# Patient Record
Sex: Male | Born: 1935 | Race: Black or African American | Hispanic: No | Smoking: Never smoker
Health system: Southern US, Community
[De-identification: ages and names within clinical notes are randomized; demographics above are authoritative.]

## PROBLEM LIST (undated history)

## (undated) DIAGNOSIS — R1310 Dysphagia, unspecified: Secondary | ICD-10-CM

## (undated) DIAGNOSIS — G9340 Encephalopathy, unspecified: Secondary | ICD-10-CM

## (undated) DIAGNOSIS — E119 Type 2 diabetes mellitus without complications: Secondary | ICD-10-CM

## (undated) DIAGNOSIS — I10 Essential (primary) hypertension: Secondary | ICD-10-CM

## (undated) DIAGNOSIS — Z789 Other specified health status: Secondary | ICD-10-CM

## (undated) DIAGNOSIS — N289 Disorder of kidney and ureter, unspecified: Secondary | ICD-10-CM

## (undated) DIAGNOSIS — K2970 Gastritis, unspecified, without bleeding: Secondary | ICD-10-CM

## (undated) DIAGNOSIS — K922 Gastrointestinal hemorrhage, unspecified: Secondary | ICD-10-CM

## (undated) DIAGNOSIS — R001 Bradycardia, unspecified: Secondary | ICD-10-CM

## (undated) DIAGNOSIS — F0390 Unspecified dementia without behavioral disturbance: Secondary | ICD-10-CM

---

## 2002-05-16 DIAGNOSIS — K2970 Gastritis, unspecified, without bleeding: Secondary | ICD-10-CM

## 2002-05-16 DIAGNOSIS — K922 Gastrointestinal hemorrhage, unspecified: Secondary | ICD-10-CM

## 2002-05-16 HISTORY — DX: Gastrointestinal hemorrhage, unspecified: K92.2

## 2002-05-16 HISTORY — DX: Gastritis, unspecified, without bleeding: K29.70

## 2002-05-16 HISTORY — PX: UPPER GASTROINTESTINAL ENDOSCOPY: SHX188

## 2002-07-02 ENCOUNTER — Ambulatory Visit (HOSPITAL_COMMUNITY): Admission: RE | Admit: 2002-07-02 | Discharge: 2002-07-02 | Payer: Self-pay | Admitting: Family Medicine

## 2002-07-02 ENCOUNTER — Encounter: Payer: Self-pay | Admitting: Family Medicine

## 2002-07-26 ENCOUNTER — Ambulatory Visit (HOSPITAL_COMMUNITY): Admission: RE | Admit: 2002-07-26 | Discharge: 2002-07-26 | Payer: Self-pay | Admitting: Family Medicine

## 2002-07-26 ENCOUNTER — Encounter: Payer: Self-pay | Admitting: Family Medicine

## 2002-11-18 ENCOUNTER — Encounter: Payer: Self-pay | Admitting: *Deleted

## 2002-11-18 ENCOUNTER — Emergency Department (HOSPITAL_COMMUNITY): Admission: EM | Admit: 2002-11-18 | Discharge: 2002-11-18 | Payer: Self-pay | Admitting: *Deleted

## 2002-11-19 ENCOUNTER — Inpatient Hospital Stay (HOSPITAL_COMMUNITY): Admission: EM | Admit: 2002-11-19 | Discharge: 2002-11-22 | Payer: Self-pay | Admitting: *Deleted

## 2003-10-22 ENCOUNTER — Emergency Department (HOSPITAL_COMMUNITY): Admission: EM | Admit: 2003-10-22 | Discharge: 2003-10-22 | Payer: Self-pay | Admitting: Emergency Medicine

## 2004-04-23 ENCOUNTER — Ambulatory Visit (HOSPITAL_COMMUNITY): Admission: RE | Admit: 2004-04-23 | Discharge: 2004-04-23 | Payer: Self-pay | Admitting: Family Medicine

## 2004-05-05 ENCOUNTER — Emergency Department (HOSPITAL_COMMUNITY): Admission: EM | Admit: 2004-05-05 | Discharge: 2004-05-06 | Payer: Self-pay | Admitting: *Deleted

## 2004-07-16 ENCOUNTER — Ambulatory Visit (HOSPITAL_COMMUNITY): Admission: RE | Admit: 2004-07-16 | Discharge: 2004-07-16 | Payer: Self-pay | Admitting: Family Medicine

## 2004-09-14 ENCOUNTER — Ambulatory Visit (HOSPITAL_COMMUNITY): Admission: RE | Admit: 2004-09-14 | Discharge: 2004-09-14 | Payer: Self-pay | Admitting: Family Medicine

## 2006-01-04 ENCOUNTER — Ambulatory Visit (HOSPITAL_COMMUNITY): Admission: RE | Admit: 2006-01-04 | Discharge: 2006-01-04 | Payer: Self-pay | Admitting: Internal Medicine

## 2006-02-15 ENCOUNTER — Encounter (INDEPENDENT_AMBULATORY_CARE_PROVIDER_SITE_OTHER): Payer: Self-pay | Admitting: Specialist

## 2006-02-15 ENCOUNTER — Observation Stay (HOSPITAL_COMMUNITY): Admission: RE | Admit: 2006-02-15 | Discharge: 2006-02-16 | Payer: Self-pay | Admitting: General Surgery

## 2006-05-11 ENCOUNTER — Ambulatory Visit (HOSPITAL_COMMUNITY): Admission: RE | Admit: 2006-05-11 | Discharge: 2006-05-11 | Payer: Self-pay | Admitting: Family Medicine

## 2006-08-21 ENCOUNTER — Ambulatory Visit (HOSPITAL_COMMUNITY): Admission: RE | Admit: 2006-08-21 | Discharge: 2006-08-21 | Payer: Self-pay | Admitting: Family Medicine

## 2007-10-27 ENCOUNTER — Emergency Department (HOSPITAL_COMMUNITY): Admission: EM | Admit: 2007-10-27 | Discharge: 2007-10-27 | Payer: Self-pay | Admitting: Emergency Medicine

## 2008-02-03 ENCOUNTER — Emergency Department (HOSPITAL_COMMUNITY): Admission: EM | Admit: 2008-02-03 | Discharge: 2008-02-03 | Payer: Self-pay | Admitting: Emergency Medicine

## 2008-10-27 ENCOUNTER — Ambulatory Visit (HOSPITAL_COMMUNITY): Admission: RE | Admit: 2008-10-27 | Discharge: 2008-10-27 | Payer: Self-pay | Admitting: Family Medicine

## 2009-03-02 ENCOUNTER — Ambulatory Visit (HOSPITAL_COMMUNITY): Admission: RE | Admit: 2009-03-02 | Discharge: 2009-03-02 | Payer: Self-pay | Admitting: Family Medicine

## 2009-11-30 ENCOUNTER — Ambulatory Visit (HOSPITAL_COMMUNITY): Admission: RE | Admit: 2009-11-30 | Discharge: 2009-11-30 | Payer: Self-pay | Admitting: Family Medicine

## 2010-09-14 HISTORY — PX: INGUINAL HERNIA REPAIR: SUR1180

## 2010-10-01 NOTE — H&P (Signed)
NAME:  Paul Perez, BELTRE                       ACCOUNT NO.:  [id number]   MEDICAL RECORD NO.:  [id number]                   PATIENT TYPE:  INP   LOCATION:  A228                                 FACILITY:  [facility]   PHYSICIAN:  Angus G. Everette Rank, M.D.              DATE OF BIRTH:  06/30/1928   DATE OF ADMISSION:  11/19/2002  DATE OF DISCHARGE:                                HISTORY & PHYSICAL   A 77 year old African American male was in his usual state of health until  two days ago, Sunday, when he ate some chicken that he thought may have been  spoiled.  He subsequently developed some abdominal pain the following  morning and had several diarrheal stools after taking Milk of Magnesia.  The  patient apparently was seen in the ED and treated for gastroenteritis.  Developed some weakness and dizziness.  Today had noted black stools.  Returned to the ED where he was evaluated.  It was noted that his hemoglobin  had dropped from 11.4 to 7.1 with a hematocrit of 34 and dropped to 20.8.  Other chemistries:  Sodium 137, potassium 5.3, chloride 111, CO2 23, glucose  329, BUN 46, creatinine 1.2.  IV fluids were started.  The patient was  admitted with GI bleeding.  Apparently had heme-positive stool in the ED as  well.   FAMILY HISTORY:  Negative.   SOCIAL HISTORY:  The patient does not smoke or drink alcohol.   PAST MEDICAL AND SURGICAL HISTORY:  The patient has had no previous surgery.  He does have known non-insulin dependent diabetes.  He has history of  arthritic knees bilaterally for which he takes occasional aspirin.   ALLERGIES:  No known allergies.   MEDICATIONS:  1. Glucophage 2 daily.  2. Aspirin 1 daily.   REVIEW OF SYSTEMS:  HEENT:  Negative.  CARDIOPULMONARY:  No cough,  hemoptysis.  Some slight dyspnea.  GI:  No bowel irregularity but recent  onset of loose stools, tarry stools, abdominal discomfort.   PHYSICAL EXAMINATION:  VITAL SIGNS:  Blood pressure 138/64, respirations  20,  pulse 90, temperature 99.8.  HEENT:  Eyes:  PERRLA.  TM's negative.  Oropharynx benign.  NECK:  Supple.  No JVD or thyroid abnormalities.  HEART:  Regular rhythm.  No murmurs.  LUNGS:  Clear to P&A.  ABDOMEN:  Tenderness in the upper abdomen.  No palpable organs or masses.  RECTAL:  Examination negative.  Heme-positive stool.  EXTREMITIES:  Free of edema.  NEUROLOGIC:  No focal deficit.  The patient has some tenderness and  decreased range of motion of both knees.    DIAGNOSES:  1. Gastrointestinal bleeding.  2. Non-insulin dependent diabetes.  Angus G. Everette Rank, M.D.    AGM/MEDQ  D:  11/19/2002  T:  11/19/2002  Job:  [id number]

## 2010-10-01 NOTE — Group Therapy Note (Signed)
   NAME:  Paul Perez, Paul Perez                       ACCOUNT NO.:  [id number]   MEDICAL RECORD NO.:  [id number]                   PATIENT TYPE:  INP   LOCATION:  A228                                 FACILITY:  [facility]   PHYSICIAN:  Angus G. Everette Rank, M.D.              DATE OF BIRTH:  06/30/1928   DATE OF PROCEDURE:  11/20/2002  DATE OF DISCHARGE:                                   PROGRESS NOTE   SUBJECTIVE:  This patient was admitted with gastrointestinal bleeding.  He  remains stable.  He did have EGD yesterday which showed evidence of duodenal  ulcer with arterial bleed.   OBJECTIVE:  Vital signs:  Blood pressure 151/76, respirations 20, pulse 80,  temperature 97.2.  Lungs:  Clear to P&A.  Heart:  Regular rhythm.  Abdomen:  No palpable organs or masses.  The patient's last hemoglobin was 8.1,  hematocrit 23.6.   ASSESSMENT:  The patient was admitted with gastrointestinal bleed secondary  to duodenal ulcer.   PLAN:  Repeat hemoglobin and hematocrit this morning.  Will continue  transfusion as needed.                                               Angus G. Everette Rank, M.D.    AGM/MEDQ  D:  11/20/2002  T:  11/20/2002  Job:  [id number]

## 2010-10-01 NOTE — Discharge Summary (Signed)
NAME:  Paul Perez, Paul Perez                       ACCOUNT NO.:  [id number]   MEDICAL RECORD NO.:  [id number]                   PATIENT TYPE:  INP   LOCATION:  A228                                 FACILITY:  [facility]   PHYSICIAN:  Angus G. Everette Rank, M.D.              DATE OF BIRTH:  06/30/1928   DATE OF ADMISSION:  11/19/2002  DATE OF DISCHARGE:  11/22/2002                                 DISCHARGE SUMMARY   This is a 77 year old African-American male admitted November 19, 2002,  discharged November 22, 2002, three days hospitalization.   DIAGNOSES:  1. Gastrointestinal bleeding secondary to duodenal ulcer with hemorrhage.  2. Helicobacter pylori infection.  3. Gastritis.  4. Diabetes mellitus type 2.   CONDITION:  Stable and improved at time of discharge.   A 77 year old African-American male in usual state of health two days prior  to admission when he ate some chicken that he thought had been spoiled.  He  subsequently developed abdominal pain the following morning and several  diarrheal stools after taking milk of magnesia.  He was seen in the ED,  treated for gastroenteritis, developed some weakness and dizziness, noted  black stools, returned to ED where he was evaluated.  His hemoglobin had  dropped from 11.4 to 7.1 with a hematocrit of 34 and dropped to 20 and 0.8.  The patient was subsequently admitted for gastrointestinal bleeding.   EXAMINATION:  GENERAL:  Alert male.  VITAL SIGNS:  Blood pressure of 138/64, respirations 20, pulse 90,  temperature 99.8.  HEENT:  Eyes - PERRLA.  Tympanic membranes negative.  Oropharynx benign.  NECK:  Supple.  No JVD or thyroid abnormalities.  HEART:  Regular rhythm with no murmurs.  LUNGS:  Clear to auscultation and percussion.  ABDOMEN:  Tenderness in the upper abdomen.  No organomegaly or internal  masses.  RECTAL:  Negative.  Heme positive stool.  EXTREMITIES:  Free of edema.  NEUROLOGIC:  No focal deficits.   LABORATORY DATA:  Admission CBC  - WBC 9700 with hemoglobin of 7.1,  hematocrit 20.8, 88 neutrophils, 8.6 absolute neutrophils, 7 lymphocytes.  Chemistry - sodium 137, potassium 0.3, chloride 111, CO2 23, glucose 329,  BUN 46, creatinine 1.2, calcium 9.7.  Liver enzymes - SGOT 13.  SGPT 12.  Alkaline phosphatase 38.  Total bilirubin 0.5.  Direct bilirubin 0.1.   HOSPITAL COURSE:  The patient at time of admission was placed on  concentrated care.  Vital signs were monitored.  He was started on  intravenous fluids.  He was typed and cross-matched for two units of blood  and given blood.  On the day of admission, he was seen in consultation by  gastroenterology service.  EGD was scheduled.  The patient was placed on  sliding scale Humalog insulin.  EGD showed evidence of duodenal ulcer with  arterial spurting, gastritis, and duodenitis.  The patient tolerated  procedure well.  He ceased to  bleed after injection of dilute epinephrine  into sight of bleeding.  The patient was made n.p.o.  Following procedures  he was given I.V. Protonix 40 mg q.12h, Carafate liquid a gram a.c. and h.s.  His diet was advanced.  The patient was subsequently placed on Niferex 150  mg b.i.d., transfused a second unit of blood.  The patient slowly progressed  with improvement during his stay and was discharged after the third day  hospitalization to be followed as an outpatient.  He was discharged on  Glucophage XR 500 mg b.i.d., Niferex 150 mg b.i.d., and Protonix 40 mg  daily.                                               Angus G. Everette Rank, M.D.    AGM/MEDQ  D:  12/10/2002  T:  12/10/2002  Job:  [id number]

## 2010-10-01 NOTE — Op Note (Signed)
NAME:  Paul Perez, Paul Perez                       ACCOUNT NO.:  [id number]   MEDICAL RECORD NO.:  [id number]                   PATIENT TYPE:  INP   LOCATION:  A228                                 FACILITY:  [facility]   PHYSICIAN:  Hildred Laser, M.D.                 DATE OF BIRTH:  06/30/1928   DATE OF PROCEDURE:  DATE OF DISCHARGE:                                  PROCEDURE NOTE   ENDOSCOPIST:  Hildred Laser, M.D.   INDICATIONS:  [name] is a 77 year old African-American male with melena  and anemia.  He has a questionable history of peptic ulcer disease but he  has been on ASA and he took a dose this morning.  The procedure and risks  were reviewed with the patient and informed consent was obtained.   PREOPERATIVE MEDICATIONS:  Cetacaine spray for oropharyngeal topical  anesthesia, Demerol 25 mg IV and Versed 4 mg IV in divided dose.   FINDINGS:  Procedure performed in endoscopy suite.  The patient's vital  signs and O2 saturation were monitored during the procedure and remained  stable.  The patient was placed in the left lateral recumbent position and  endoscope was passed via the oropharynx without any difficulty into the  esophagus.   ESOPHAGUS:  Mucosa of the esophagus was normal throughout.  Squamocolumnar  junction was unremarkable.   STOMACH:  It had some maroon looking blood in the fundus.  Examination of  the mucosa revealed antral granularity.  There was fresh blood pouring out  of the pylorus into the stomach indicative of bleeding lesion in the  duodenum.  Angularis, fundus, and cardia were normal.   DUODENUM:  Examination of the bulb revealed erosions and an ulcer along the  anteromedial wall with arterial spurting.  There was pseudodiverticula  formation to the anterior wall secondary to previous PUD with scarring.   The bleeding site was injected with 1 cc of dilute epinephrine (1:10,000).  The bleeding site was coagulated with gold probe. There was complete  hemostasis.  Pictures were taken for the record.  Endoscope was withdrawn.  The patient tolerated the procedure well.   FINAL DIAGNOSES:  1. Duodenal/bulbar ulcer with arterial spurting.  Hemostasis secured with     injection therapy followed by coagulation with a gold probe.  2. Gastritis with duodenitis.  3. Bulbar pseudodiverticula.   RECOMMENDATIONS:  1. Double dose of PPI initially IV and will switch him to p.o. tomorrow.  2.     _________ liquid 1 gm a.c. and q.h.s.  3. Check H. pylori serology.  4. Will proceed with transfusion as planned.  Please note the first unit was     started while he was in day hospital.  Hildred Laser, M.D.    NR/MEDQ  D:  11/19/2002  T:  11/19/2002  Job:  [id number]   cc:   Angus G. Everette Rank, M.D.  [address]  [place]  [place] [address]  Fax: [phone]

## 2010-10-01 NOTE — Op Note (Signed)
NAMEGYASI, COVER NO.:  [id number]   MEDICAL RECORD NO.:  [id number]          PATIENT TYPE:  OBV   LOCATION:  A316                          FACILITY:  [facility]   PHYSICIAN:  Felicie Morn, M.D. DATE OF BIRTH:  06/30/1928   DATE OF PROCEDURE:  02/15/2006  DATE OF DISCHARGE:                                 OPERATIVE REPORT   SURGEON:  Felicie Morn, M.D.   PREOPERATIVE DIAGNOSIS:  Bilateral inguinal hernias (indirect inguinal  hernia on the left side, direct hernia on the right side).   POSTOPERATIVE DIAGNOSIS:  Bilateral inguinal hernias (indirect inguinal  hernia on the left side, direct hernia on the right side).   PROCEDURE:  Bilateral inguinal herniorrhaphies (modified McVay repair).   SPECIMEN:  Left inguinal hernia sac.  No specimen on the right side.   NOTE:  This is a 77 year old black male who presented with bilateral  inguinal hernias.  We discussed elective repair with him, discussing  complications not limited to but including bleeding, infection, and  recurrence.  Informed consent was obtained.   GROSS OPERATIVE FINDINGS:  Those consistent with an indirect defect on the  left side and a direct hernia defect on the right side.   TECHNIQUE:  The patient was placed in the supine position after the adequate  administration of spinal anesthesia and the placement of a Foley catheter  aseptically.   After prepping with Betadine and draping in the usual manner, an incision  was carried out between the anterior superior iliac spine and the pubic  tubercle on the left side through skin and subcutaneous tissue down through  Scarpa's layer and down to the external oblique which was opened through the  external ring.  The cord structures were then dissected free from an  inguinal hernia sac which was ligated with 2-0 Bralon under direct vision  and the redundant portion of the sac sent as a specimen.  The inguinal floor  was then repaired, suturing  the transversus abdominis and transversalis  fascia to Cooper's ligament and Poupart's ligament with interrupted 2-0  Bralon sutures.  The ilioinguinal nerve was preserved as well as the cord  structures, and there were returned to their anatomic position.  The  external oblique which was repaired over this.  We used 0.5% Sensorcaine in  the wound for postoperative comfort.  The wound was irrigated, and the skin  was approximated with a stapling device.  A sterile OpSite dressing was  applied.   Attention was then turned to the right side where an incision was again made  between the anterior superior iliac spine and the pubic tubercle.  The  external oblique was opened through the external ring, and the cord  structures were separated.  There was noted no indirect defect.  There was a  direct defect on the hernia floor.  This was then approximated and repaired  using interrupted sutures of 2-0 Bralon to close the transversalis fascia  and transversus abdominis to the Cooper's ligament and Poupart's ligament in  an interrupted fashion.  A relaxing incision was carried out prior to  cinching this  tightly.  We used 0.5% Sensorcaine to help with postoperative  comfort.  The cord structures were returned to their anatomic position, with  the external oblique repaired over this.  The wound was irrigated with  normal saline solution.  The skin was approximated with a stapling device.  Prior to closure, all sponge, needle, and instrument counts were found be  correct.  Estimated blood loss was minimal.   The patient received 1000 mL of crystalloids intraoperatively and was taken  to recovery room in satisfactory condition.      Felicie Morn, M.D.  Electronically Signed     WB/MEDQ  D:  02/15/2006  T:  02/16/2006  Job:  [id number]   cc:   Angus G. Everette Rank, MD  Fax: [phone]

## 2010-10-01 NOTE — Consult Note (Signed)
NAME:  Paul Perez, Paul Perez                       ACCOUNT NO.:  [id number]   MEDICAL RECORD NO.:  [id number]                   PATIENT TYPE:  INP   LOCATION:  [id number]                                 FACILITY:  [facility]   PHYSICIAN:  Hildred Laser, M.D.                 DATE OF BIRTH:  06/30/1928   DATE OF CONSULTATION:  11/19/2002  DATE OF DISCHARGE:                                   CONSULTATION   REASON FOR CONSULTATION:  Melena and anemia.   HISTORY OF PRESENT ILLNESS:  Paul Perez is a 77 year old African-American  male who was admitted to Dr. Everette Rank' service via emergency room a few  minutes ago.  He had a two-day history of melena and anemia.   The patient was in usual state of health until two days ago when he noted  rolling and rumbling in his abdomen, and he had five bowel movements.  The  stool was liquid and tarry, but he did not quite realize the significance.  Yesterday he had a few more tarry stools, and he felt very weak when he  would sit or stand.  He did not have any dyspnea, chest pain, or syncope.  He was brought to the emergency room by his son.  He was evaluated.  His  stool was heme positive.  He was treated and discharged.  The patient's  hemoglobin at that time was 11.4, hematocrit 34, and I noted him BUN was 35.  The patient was planning to see Dr. Everette Rank today, but this morning he felt  even weaker than he did yesterday.  However, he has not had any more melena  today.  He came to the emergency room again.  His hemoglobin this time was  7.1, and hematocrit was 20.8.  He was, therefore hospitalized for IV fluids,  packed RBCs and further workup.  The patient denies nausea, vomiting,  heartburn, dysphagia, or hematemesis.  He also denies epigastric pain.  His  bowels are irregular, but that has been pattern for years.  He recalls that  years ago it was felt that he had an ulcer; however, his x-rays were normal.  He is on ASA 325 mg daily, but he did not tell the ER  staff that he was  taking this medication.  He took a dose this morning.  He is on an oral  hypoglycemic b.i.d. and insulin via sliding scale.  He has a good appetite,  and he has not lost any weight recently.   PAST MEDICAL HISTORY:  Diabetes mellitus for about 10 months.  He has never  had any surgeries, but tells me that he may have some arthritis as he used  to play football.   ALLERGIES:  None known.   FAMILY HISTORY:  Positive for diabetes mellitus in mother who is 62 and  staying at home with help from her daughter.  His sister is also diabetic.  He  has a brother living who is also diabetic.  He lost a one brother of auto  accident in his 17s, and one sister died at an early age.   SOCIAL HISTORY:  He is widowed.  His wife died about 12 years ago.  He  worked at [employer] for 20 years.  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  He has five  children in good health.  He tells me he smoked cigarettes since he was 77  years old but quit about 13 to 14 years ago.  He smoked anywhere from one-  half to one and one-half packs per day.  He does not drink alcohol.   PHYSICAL EXAMINATION:  GENERAL:  Pleasant, well-developed, mildly obese  African-American male who is in no acute distress.  VITAL SIGNS:  Admission weight is pending.  Pulse 118 per minute, blood  pressure 115/58, respiratory rate 13, and temperature 98.  HEENT:  Conjunctivae pale.  Sclerae nonicteric.  Oropharyngeal mucosa  normal.  Dentition in satisfactory condition.  NECK:  Without masses or thyromegaly.  CARDIAC:  Regular rate and rhythm.  S1, S2.  No murmur or gallop noted.  LUNGS:  Clear to auscultation.  ABDOMEN:  Bowel sounds are normal.  Palpation reveals soft abdomen without  tenderness, organomegaly, or masses.  RECTAL:  He has had one this morning and one yesterday.  EXTREMITIES:  No edema or clubbing.   LABORATORY DATA:  On admission, WBC 9.7, hemoglobin 7.1, hematocrit 20.8,  platelet count 162,000,  MCV 80.6.   Serum sodium 137, potassium 5.3,  chloride 111, CO2 23, glucose 329, BUN 46, creatinine 1.2, calcium 7.9.  INR  1.1, PTT 23.   ASSESSMENT:  [name] is a healthy-appearing 77 year old African-American  male who presents with two-day history of melena.  He was in the emergency  room yesterday when he had a hemoglobin of 11.4 g, and his hemoglobin today  is 7.1 g.  Therefore, he has had a significant amount of blood loss.  He is  getting ready to be transfused.  He is on ASA for antiplatelet functions.  He has questionable history of peptic ulcer disease in the past.  I suspect  we are dealing with gastrointestinal bleed secondary to peptic ulcer  disease.   I agree with use of PPI in this setting.  Esophagogastroduodenoscopy with  therapeutic intention to be performed later this afternoon.   I would like to thank Dr. Everette Rank for the opportunity to participate in the  care of fine man.                                               Hildred Laser, M.D.    NR/MEDQ  D:  11/19/2002  T:  11/19/2002  Job:  [id number]

## 2010-10-01 NOTE — Group Therapy Note (Signed)
   NAME:  Paul Perez, Paul Perez                       ACCOUNT NO.:  [id number]   MEDICAL RECORD NO.:  [id number]                   PATIENT TYPE:  INP   LOCATION:  A228                                 FACILITY:  [facility]   PHYSICIAN:  Angus G. Everette Rank, M.D.              DATE OF BIRTH:  06/30/1928   DATE OF PROCEDURE:  DATE OF DISCHARGE:                                   PROGRESS NOTE   This patient was admitted with gastrointestinal bleeding.  His condition has  remained stable. He had no further evidence of blood, but he did drop his  hemoglobin down to 7.9 with hematocrit 22.8. He did receive a unit of blood  last evening; and most recent hemoglobin was 8.8 with hematocrit 25.6.   OBJECTIVE:  VITAL SIGNS: Blood pressure 138/61, respirations 20, pulse 61,  temperature 98.2.  LUNGS:  Clear to P&A.  HEART:  Regular rhythm.  ABDOMEN:  No palpable organs or masses.   ASSESSMENT:  The patient is admitted with gastrointestinal bleed, but does  have evidence of duodenal ulcer.  Has anemia secondary to GI bleed.   PLAN:  To continue current regimen. The patient may be discharged today  after being seen by gastroenterology service.                                               Angus G. Everette Rank, M.D.    AGM/MEDQ  D:  11/22/2002  T:  11/22/2002  Job:  [id number]

## 2012-06-22 ENCOUNTER — Ambulatory Visit (HOSPITAL_COMMUNITY)
Admission: RE | Admit: 2012-06-22 | Discharge: 2012-06-22 | Disposition: A | Discharge status: Home / Self Care | Payer: Medicare Other | Source: Ambulatory Visit | Attending: Family Medicine | Admitting: Family Medicine

## 2012-06-22 ENCOUNTER — Other Ambulatory Visit (HOSPITAL_COMMUNITY): Payer: Self-pay | Admitting: Family Medicine

## 2012-06-22 DIAGNOSIS — R059 Cough, unspecified: Secondary | ICD-10-CM | POA: Insufficient documentation

## 2012-06-22 DIAGNOSIS — R05 Cough: Secondary | ICD-10-CM

## 2012-09-10 ENCOUNTER — Ambulatory Visit (HOSPITAL_COMMUNITY)
Admission: RE | Admit: 2012-09-10 | Discharge: 2012-09-10 | Disposition: A | Discharge status: Home / Self Care | Payer: Medicare Other | Source: Ambulatory Visit | Attending: Family Medicine | Admitting: Family Medicine

## 2012-09-10 ENCOUNTER — Other Ambulatory Visit (HOSPITAL_COMMUNITY): Payer: Self-pay | Admitting: Family Medicine

## 2012-09-10 DIAGNOSIS — R05 Cough: Secondary | ICD-10-CM

## 2012-09-10 DIAGNOSIS — R059 Cough, unspecified: Secondary | ICD-10-CM | POA: Insufficient documentation

## 2013-08-20 ENCOUNTER — Inpatient Hospital Stay (HOSPITAL_COMMUNITY)
Admission: EM | Admit: 2013-08-20 | Discharge: 2013-08-29 | DRG: 557 | Disposition: A | Discharge status: Home Health | Payer: Medicare Other | Attending: Family Medicine | Admitting: Family Medicine

## 2013-08-20 ENCOUNTER — Emergency Department (HOSPITAL_COMMUNITY): Payer: Medicare Other

## 2013-08-20 ENCOUNTER — Encounter (HOSPITAL_COMMUNITY): Payer: Self-pay | Admitting: Emergency Medicine

## 2013-08-20 DIAGNOSIS — M6282 Rhabdomyolysis: Principal | ICD-10-CM | POA: Diagnosis present

## 2013-08-20 DIAGNOSIS — G929 Unspecified toxic encephalopathy: Secondary | ICD-10-CM | POA: Diagnosis present

## 2013-08-20 DIAGNOSIS — I491 Atrial premature depolarization: Secondary | ICD-10-CM | POA: Diagnosis not present

## 2013-08-20 DIAGNOSIS — G9340 Encephalopathy, unspecified: Secondary | ICD-10-CM

## 2013-08-20 DIAGNOSIS — R001 Bradycardia, unspecified: Secondary | ICD-10-CM

## 2013-08-20 DIAGNOSIS — I498 Other specified cardiac arrhythmias: Secondary | ICD-10-CM | POA: Diagnosis not present

## 2013-08-20 DIAGNOSIS — R5381 Other malaise: Secondary | ICD-10-CM | POA: Diagnosis present

## 2013-08-20 DIAGNOSIS — E860 Dehydration: Secondary | ICD-10-CM | POA: Diagnosis present

## 2013-08-20 DIAGNOSIS — E119 Type 2 diabetes mellitus without complications: Secondary | ICD-10-CM | POA: Diagnosis present

## 2013-08-20 DIAGNOSIS — F0390 Unspecified dementia without behavioral disturbance: Secondary | ICD-10-CM

## 2013-08-20 DIAGNOSIS — G92 Toxic encephalopathy: Secondary | ICD-10-CM | POA: Diagnosis present

## 2013-08-20 DIAGNOSIS — R4182 Altered mental status, unspecified: Secondary | ICD-10-CM

## 2013-08-20 DIAGNOSIS — N390 Urinary tract infection, site not specified: Secondary | ICD-10-CM | POA: Diagnosis present

## 2013-08-20 DIAGNOSIS — N179 Acute kidney failure, unspecified: Secondary | ICD-10-CM | POA: Diagnosis present

## 2013-08-20 DIAGNOSIS — I10 Essential (primary) hypertension: Secondary | ICD-10-CM | POA: Diagnosis present

## 2013-08-20 HISTORY — DX: Gastrointestinal hemorrhage, unspecified: K92.2

## 2013-08-20 HISTORY — DX: Essential (primary) hypertension: I10

## 2013-08-20 HISTORY — DX: Gastritis, unspecified, without bleeding: K29.70

## 2013-08-20 HISTORY — DX: Other specified health status: Z78.9

## 2013-08-20 LAB — COMPREHENSIVE METABOLIC PANEL WITH GFR
ALT: 23 U/L (ref 0–53)
AST: 94 U/L — ABNORMAL HIGH (ref 0–37)
Albumin: 4 g/dL (ref 3.5–5.2)
Alkaline Phosphatase: 86 U/L (ref 39–117)
BUN: 24 mg/dL — ABNORMAL HIGH (ref 6–23)
CO2: 26 meq/L (ref 19–32)
Calcium: 10 mg/dL (ref 8.4–10.5)
Chloride: 100 meq/L (ref 96–112)
Creatinine, Ser: 1.44 mg/dL — ABNORMAL HIGH (ref 0.50–1.35)
GFR calc Af Amer: 50 mL/min — ABNORMAL LOW
GFR calc non Af Amer: 43 mL/min — ABNORMAL LOW
Glucose, Bld: 99 mg/dL (ref 70–99)
Potassium: 4.4 meq/L (ref 3.7–5.3)
Sodium: 142 meq/L (ref 137–147)
Total Bilirubin: 0.8 mg/dL (ref 0.3–1.2)
Total Protein: 7.7 g/dL (ref 6.0–8.3)

## 2013-08-20 LAB — CBC WITH DIFFERENTIAL/PLATELET
Basophils Absolute: 0 10*3/uL (ref 0.0–0.1)
Basophils Relative: 0 % (ref 0–1)
Eosinophils Absolute: 0 10*3/uL (ref 0.0–0.7)
Eosinophils Relative: 0 % (ref 0–5)
HCT: 43.8 % (ref 39.0–52.0)
Hemoglobin: 14.4 g/dL (ref 13.0–17.0)
Lymphocytes Relative: 8 % — ABNORMAL LOW (ref 12–46)
Lymphs Abs: 0.7 10*3/uL (ref 0.7–4.0)
MCH: 27.8 pg (ref 26.0–34.0)
MCHC: 32.9 g/dL (ref 30.0–36.0)
MCV: 84.6 fL (ref 78.0–100.0)
Monocytes Absolute: 0.6 10*3/uL (ref 0.1–1.0)
Monocytes Relative: 7 % (ref 3–12)
Neutro Abs: 7.6 10*3/uL (ref 1.7–7.7)
Neutrophils Relative %: 84 % — ABNORMAL HIGH (ref 43–77)
Platelets: 201 10*3/uL (ref 150–400)
RBC: 5.18 MIL/uL (ref 4.22–5.81)
RDW: 13.4 % (ref 11.5–15.5)
WBC: 9 10*3/uL (ref 4.0–10.5)

## 2013-08-20 LAB — URINE MICROSCOPIC-ADD ON

## 2013-08-20 LAB — URINALYSIS, ROUTINE W REFLEX MICROSCOPIC
Bilirubin Urine: NEGATIVE
Glucose, UA: NEGATIVE mg/dL
LEUKOCYTES UA: NEGATIVE
NITRITE: NEGATIVE
UROBILINOGEN UA: 0.2 mg/dL (ref 0.0–1.0)
pH: 6 (ref 5.0–8.0)

## 2013-08-20 LAB — CBG MONITORING, ED: GLUCOSE-CAPILLARY: 82 mg/dL (ref 70–99)

## 2013-08-20 LAB — CK: Total CK: 6248 U/L — ABNORMAL HIGH (ref 7–232)

## 2013-08-20 MED ORDER — CIPROFLOXACIN HCL 250 MG PO TABS: 500.0000 mg | ORAL_TABLET | Freq: Once | ORAL | Status: DC

## 2013-08-20 MED ORDER — SODIUM CHLORIDE 0.9 % IV BOLUS (SEPSIS)
500.0000 mL | Freq: Once | INTRAVENOUS | Status: AC
  Administered 2013-08-20: 500 mL via INTRAVENOUS

## 2013-08-20 MED ORDER — SENNOSIDES-DOCUSATE SODIUM 8.6-50 MG PO TABS: 1.0000 | ORAL_TABLET | Freq: Every evening | ORAL | Status: DC | PRN

## 2013-08-20 MED ORDER — ENOXAPARIN SODIUM 30 MG/0.3ML ~~LOC~~ SOLN
30.0000 mg | SUBCUTANEOUS | Status: DC
  Administered 2013-08-22 – 2013-08-26 (×5): 30 mg via SUBCUTANEOUS
  Filled 2013-08-20 (×5): qty 0.3

## 2013-08-20 MED ORDER — CIPROFLOXACIN HCL 500 MG PO TABS: 500.0000 mg | ORAL_TABLET | Freq: Two times a day (BID) | ORAL | Status: DC

## 2013-08-20 MED ORDER — ASPIRIN 300 MG RE SUPP
300.0000 mg | Freq: Every day | RECTAL | Status: DC
  Filled 2013-08-20 (×10): qty 1

## 2013-08-20 MED ORDER — ASPIRIN 325 MG PO TABS
325.0000 mg | ORAL_TABLET | Freq: Every day | ORAL | Status: DC
  Administered 2013-08-21 – 2013-08-29 (×9): 325 mg via ORAL
  Filled 2013-08-20 (×9): qty 1

## 2013-08-20 MED ORDER — LORAZEPAM 2 MG/ML IJ SOLN
0.5000 mg | Freq: Once | INTRAMUSCULAR | Status: AC
  Administered 2013-08-20: 0.5 mg via INTRAVENOUS
  Filled 2013-08-20: qty 1

## 2013-08-20 NOTE — H&P (Signed)
[facility] History and Physical  Paul Perez [id number] DOB: [birth year] DOA: 08/20/2013  Referring physician:  PCP: Lanette Hampshire, MD  Specialists:   Chief Complaint: Confusion  HPI: Paul Perez is a 80 y.o. male with a history of HTN who was sent to the ED after his son had not been able to locate him from the night before, and was found outside in his truck.  He was confused and was not able to recognize people he knew.   He was sent to the ED for evaluation.  A CT scan of the brain was performed and was negative foacute findings. He was found to have a CPK level of 6248.   His son reports that at baseline he is coherent with rare moments of confusion.   Patient does not know why he is here, and denies having any chest pain of headache.       Review of Systems:  Constitutional: No Weight Loss, No Weight Gain, Night Sweats, Fevers, Chills, Fatigue, or Generalized Weakness HEENT: No Headaches, Difficulty Swallowing,Tooth/Dental Problems,Sore Throat,  No Sneezing, Rhinitis, Ear Ache, Nasal Congestion, or Post Nasal Drip,  Cardio-vascular:  No Chest pain, Orthopnea, PND, Edema in lower extremities, Anasarca, Dizziness, Palpitations  Resp: No Dyspnea, No DOE, No Cough, No Hemoptysis, No Wheezing.    GI: No Heartburn, Indigestion, Abdominal Pain, Nausea, Vomiting, Diarrhea, Change in Bowel Habits,  Loss of Appetite  GU: No Dysuria, Change in Color of Urine, No Urgency or Frequency.  No flank pain.  Musculoskeletal: No Joint Pain or Swelling.  No Decreased Range of Motion. No Back Pain.  Neurologic: No Syncope, No Seizures, Muscle Weakness, Paresthesia, Vision Disturbance or Loss, No Diplopia, No Vertigo, No Difficulty Walking,  Skin: No Rash or Lesions. Psych: No Change in Mood or Affect. No Depression or Anxiety. No Memory loss. +Confusion, No Hallucinations   Past Medical History  Diagnosis Date  . Hypertension   . Heart problem   . Poor historian        History reviewed. No pertinent past surgical history.     Prior to Admission medications   Medication Sig Start Date End Date Taking? Authorizing Provider  ciprofloxacin (CIPRO) 500 MG tablet Take 1 tablet (500 mg total) by mouth 2 (two) times daily. One po bid x 7 days 08/20/13   Maudry Diego, MD      No Known Allergies   Social History:  has no tobacco, alcohol, and drug history on file.     No family history on file.     Physical Exam:  GEN:  Agitated and Angry Elderly Well nourished and Well Developed  80 y.o. African American  male  examined  and in no acute distress; cooperative with exam Filed Vitals:   08/20/13 1809 08/20/13 1930 08/20/13 2017  BP: 178/81  178/92  Pulse: 74  76  Temp: 97.9 F (36.6 C) 97.9 F (36.6 C)   TempSrc: Oral    Resp: 15  20  SpO2: 99%  99%   Blood pressure 178/92, pulse 76, temperature 97.9 F (36.6 C), temperature source Oral, resp. rate 20, SpO2 99.00%. PSYCH: He is alert and oriented x4; does not appear anxious does not appear depressed; affect is normal HEENT: Normocephalic and Atraumatic, Mucous membranes pink; PERRLA; EOM intact; Fundi:  Benign;  No scleral icterus, Nares: Patent, Oropharynx: Clear, Fair Dentition, Neck:  FROM, no cervical lymphadenopathy nor thyromegaly or carotid bruit; no JVD; Breasts:: Not examined CHEST WALL: No tenderness CHEST: Normal  respiration, clear to auscultation bilaterally HEART: Regular rate and rhythm; no murmurs rubs or gallops BACK: No kyphosis or scoliosis; no CVA tenderness ABDOMEN: Positive Bowel Sounds,  Obese, soft non-tender; no masses, no organomegaly, no pannus; no intertriginous candida. Rectal Exam: Not done EXTREMITIES: No cyanosis, clubbing or edema; no ulcerations. Genitalia: not examined PULSES: 2+ and symmetric SKIN: Normal hydration no rash or ulceration CNS:   Mental Status:  Alert, oriented x 3 but thought content inappropriate. Speech fluent without evidence of  aphasia. Able to follow 3 step commands without difficulty. In No obvious pain.  Cranial Nerves:  II: Discs flat bilaterally; Visual fields Intact,  Pupils equal and reactive.  III,IV, VI:  extra-ocular motions intact bilaterally  V,VII: smile symmetric, facial light touch sensation normal bilaterally  VIII: hearing Intact bilaterally  IX,X: gag reflex present  XI: bilateral shoulder shrug  XII: midline tongue extension  Motor:  Right : Upper extremity 5/5 Left: Upper extremity 5/5  Right Lower extremity 5/5 Left Lower extremity 5/5  Tone and bulk:normal tone throughout; no atrophy noted  Sensory: Pinprick and light touch intact throughout, bilaterally  Deep Tendon Reflexes: 2+ and symmetric throughout  Plantars/ Babinski: Right: normal Left: normal   Cerebellar: Finger to nose with or without difficulty.  Gait: deferred  Vascular: pulses palpable throughout    Labs on Admission:  Basic Metabolic Panel:  Recent Labs Lab 08/20/13 1830  NA 142  K 4.4  CL 100  CO2 26  GLUCOSE 99  BUN 24*  CREATININE 1.44*  CALCIUM 10.0   Liver Function Tests:  Recent Labs Lab 08/20/13 1830  AST 94*  ALT 23  ALKPHOS 86  BILITOT 0.8  PROT 7.7  ALBUMIN 4.0   No results found for this basename: LIPASE, AMYLASE,  in the last 168 hours No results found for this basename: AMMONIA,  in the last 168 hours CBC:  Recent Labs Lab 08/20/13 1830  WBC 9.0  NEUTROABS 7.6  HGB 14.4  HCT 43.8  MCV 84.6  PLT 201   Cardiac Enzymes:  Recent Labs Lab 08/20/13 1830  CKTOTAL 6248*    BNP (last 3 results) No results found for this basename: PROBNP,  in the last 8760 hours CBG:  Recent Labs Lab 08/20/13 1829  GLUCAP 82    Radiological Exams on Admission: Dg Chest 2 View  08/20/2013   CLINICAL DATA:  Altered mental status.  Weakness.  EXAM: CHEST  2 VIEW  COMPARISON:  PA and lateral chest 09/10/2012.  FINDINGS: The lungs are clear. Heart size is normal. No pneumothorax or pleural  effusion. Thoracic spondylosis noted.  IMPRESSION: No acute disease.   Electronically Signed   By: Inge Rise M.D.   On: 08/20/2013 18:51   Ct Head Wo Contrast  08/20/2013   CLINICAL DATA:  Altered mental status.  EXAM: CT HEAD WITHOUT CONTRAST  TECHNIQUE: Contiguous axial images were obtained from the base of the skull through the vertex without intravenous contrast.  COMPARISON:  None.  FINDINGS: No mass lesion. No midline shift. No acute hemorrhage or hematoma. No extra-axial fluid collections. No evidence of acute infarction. There is slight diffuse cerebral cortical atrophy with secondary slight prominence of the ventricles.  No osseous abnormality.  IMPRESSION: Slight atrophy.  Otherwise normal exam.   Electronically Signed   By: Rozetta Nunnery M.D.   On: 08/20/2013 19:04      EKG: Independently reviewed. Normal sinus Rhythm with Ischemic Changes in Inferior Leads, and LVH changes.   Rate =  78    Assessment/Plan:   80 y.o. male with  Principal Problem:   Acute encephalopathy Active Problems:   Hypertension   AKI (acute kidney injury)   Rhabdomyolysis   UTI (lower urinary tract infection)     1.  Acute Encephalopathy-   Etiologies include R/O CVA, Early UTI Metabolic Cause  CVAworkup initiated.   Placed on IV Cipro and IVFs for Rehydration.    2.  Hypertension-   Verify Home medications,  PRN IV Hydralazine for SBP > 160.    3.  AKI-  Rehydrate, Monitor BUN/Cr.    4.  Rhabdomyolysis-  Rehydrate, Monitor CPK levels.    5.   UTI- Urine C+S sent, and placed empirically on IV Cipro.    6.  DVT prophylaxis with Lovenox.       Code Status:   FULL CODE Family Communication:    No Family present Disposition Plan:   Inpatient  Time spent:  [place] [facility] Pager [phone]  If 7PM-7AM, please contact night-coverage [email or web address] Password [facility] 08/20/2013, 11:04 PM
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
## 2013-08-20 NOTE — ED Notes (Signed)
Family at bedside. Patient is not able to perform task of ambulation. Patient not able to stand with two person assist.

## 2013-08-20 NOTE — ED Notes (Signed)
Found pt standing at foot of bed, profoundly confused, thinks he's in [place] and we're holding him hostage.son is gone. Pt pushing past me to go to bathroom, escorted him to Bathroom. Gait is ataxic but pt is pushing past me and getting aggressive. Did sit on toilet for a time with no results. Escorted back to room with assistance from PCT who has personally known this man for some time. Pt is unaware of who she is. Dr. Dewayne Hatch made aware of this mental status change.
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
## 2013-08-20 NOTE — ED Notes (Signed)
Patient has become combative and confused time and place.

## 2013-08-20 NOTE — Discharge Instructions (Signed)
Follow up with dr. Everette Rank in 1-2 days
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
## 2013-08-21 ENCOUNTER — Encounter (HOSPITAL_COMMUNITY): Payer: Self-pay | Admitting: Radiology

## 2013-08-21 ENCOUNTER — Inpatient Hospital Stay (HOSPITAL_COMMUNITY): Payer: Medicare Other

## 2013-08-21 DIAGNOSIS — I517 Cardiomegaly: Secondary | ICD-10-CM

## 2013-08-21 LAB — LIPID PANEL
Cholesterol: 250 mg/dL — ABNORMAL HIGH (ref 0–200)
HDL: 69 mg/dL (ref >39)
LDL Cholesterol: 169 mg/dL — ABNORMAL HIGH (ref 0–99)
TRIGLYCERIDES: 59 mg/dL (ref <150)
Total CHOL/HDL Ratio: 3.6 RATIO
VLDL: 12 mg/dL (ref 0–40)

## 2013-08-21 LAB — HEMOGLOBIN A1C
HEMOGLOBIN A1C: 6 % — AB (ref <5.7)
Mean Plasma Glucose: 126 mg/dL — ABNORMAL HIGH (ref <117)

## 2013-08-21 MED ORDER — LORAZEPAM 2 MG/ML IJ SOLN
1.0000 mg | Freq: Once | INTRAMUSCULAR | Status: AC
  Administered 2013-08-21: 1 mg via INTRAVENOUS

## 2013-08-21 MED ORDER — DEXTROSE-NACL 5-0.45 % IV SOLN
INTRAVENOUS | Status: DC
  Administered 2013-08-21 – 2013-08-29 (×15): via INTRAVENOUS

## 2013-08-21 MED ORDER — LORAZEPAM 2 MG/ML IJ SOLN
INTRAMUSCULAR | Status: AC
  Filled 2013-08-21: qty 1

## 2013-08-21 MED ORDER — CIPROFLOXACIN HCL 250 MG PO TABS
500.0000 mg | ORAL_TABLET | Freq: Two times a day (BID) | ORAL | Status: DC
  Administered 2013-08-21 – 2013-08-29 (×17): 500 mg via ORAL
  Filled 2013-08-21 (×17): qty 2

## 2013-08-21 NOTE — Progress Notes (Signed)
  Echocardiogram 2D Echocardiogram has been performed.  Paul Perez Orlean Patten 08/21/2013, 3:40 PM

## 2013-08-21 NOTE — Consult Note (Signed)
Paul A. Merlene Laughter, MD     [email or web address]          Paul Perez is an 80 y.o. male.   ASSESSMENT/PLAN: 1. Acute encephalopathy. This is likely multifactorial particularly from toxic metabolic causes. We will repeat neurological examination. Dementia labs will be obtained. Avoid sedative medication as much as possible.  This is a 80 year-old black male who presented with acute confusion and disorientation. The patient apparently left his house and was found wandering. He was sent to emergency room for further evaluation after being discovered by his family. He has been noted to have a dramatically elevated CPK of 6000. There is also evidence of dehydration and borderline UTI. Patient has been confused and irritated. He was given Ativan today because of confusion and now appears to be quite drowsy. Review of systems unobtainable because of the severe encephalopathy.  GENERAL: The patient is in no acute distress.  HEENT: Supple. Atraumatic normocephalic.   ABDOMEN: soft  EXTREMITIES: No edema   BACK: Normal.  SKIN: Normal by inspection.    MENTAL STATUS: He is quite stuporous. He requires sternal rub to open his eyes but falls back to sleep immediately. He is unable to keep his eyes open for any significant amount of time. He does not follow commands.  CRANIAL NERVES: Pupils are equal, round and reactive to light; extra ocular movements are full- Passively, there is no significant nystagmus; are full; upper and lower facial muscles are normal in strength and symmetric, there is no flattening of the nasolabial folds.  MOTOR: He does seem to move all 4 extremities fairly well.  COORDINATION: There are no tremors or dysmetria.  REFLEXES: Deep tendon reflexes are symmetrical and normal.   SENSATION: Normal to painful stimuli.     Past Medical History  Diagnosis Date  . Hypertension   . Heart problem   . Poor historian     History reviewed. No  pertinent past surgical history.  History reviewed. No pertinent family history.  Social History:  reports that he has never smoked. He does not have any smokeless tobacco history on file. He reports that he does not drink alcohol or use illicit drugs.  Allergies: No Known Allergies  Medications: Prior to Admission medications   Medication Sig Start Date End Date Taking? Authorizing Provider  amLODipine (NORVASC) 5 MG tablet Take 5 mg by mouth daily.   Yes Historical Provider, MD  insulin glargine (LANTUS) 100 UNIT/ML injection Inject 35 Units into the skin daily.   Yes Historical Provider, MD  insulin lispro (HUMALOG) 100 UNIT/ML injection Inject 5-35 Units into the skin 3 (three) times daily before meals. According to sliding scale at home.   Yes Historical Provider, MD  lisinopril (PRINIVIL,ZESTRIL) 10 MG tablet Take 10 mg by mouth daily.   Yes Historical Provider, MD  meclizine (ANTIVERT) 25 MG tablet Take 25 mg by mouth 3 (three) times daily as needed for dizziness.   Yes Historical Provider, MD  simvastatin (ZOCOR) 40 MG tablet Take 40 mg by mouth daily.   Yes Historical Provider, MD  triamterene-hydrochlorothiazide (MAXZIDE-25) 37.5-25 MG per tablet Take 1 tablet by mouth daily.   Yes Historical Provider, MD  zolpidem (AMBIEN) 10 MG tablet Take 10 mg by mouth at bedtime as needed for sleep.   Yes Historical Provider, MD  ciprofloxacin (CIPRO) 500 MG tablet Take 1 tablet (500 mg total) by mouth 2 (two) times daily. One po bid x 7 days 08/20/13   Elwyn Lade  Zammit, MD    Scheduled Meds: . aspirin  300 mg Rectal Daily   Or  . aspirin  325 mg Oral Daily  . ciprofloxacin  500 mg Oral BID  . enoxaparin (LOVENOX) injection  30 mg Subcutaneous Q24H   Continuous Infusions: . dextrose 5 % and 0.45% NaCl 100 mL/hr at 08/21/13 0714   PRN Meds:.senna-docusate   Blood pressure 179/75, pulse 69, temperature 98 F (36.7 C), temperature source Oral, resp. rate 20, height _0  (1.753 m), weight  80.65 kg (177 lb 12.8 oz), SpO2 99.00%.   Results for orders placed during the hospital encounter of 08/20/13 (from the past 48 hour(s))  URINALYSIS, ROUTINE W REFLEX MICROSCOPIC     Status: Abnormal   Collection Time    08/20/13  6:20 PM      Result Value Ref Range   Color, Urine YELLOW  YELLOW   APPearance HAZY (*) CLEAR   Specific Gravity, Urine >1.030 (*) 1.005 - 1.030   pH 6.0  5.0 - 8.0   Glucose, UA NEGATIVE  NEGATIVE mg/dL   Hgb urine dipstick LARGE (*) NEGATIVE   Bilirubin Urine NEGATIVE  NEGATIVE   Ketones, ur TRACE (*) NEGATIVE mg/dL   Protein, ur >300 (*) NEGATIVE mg/dL   Urobilinogen, UA 0.2  0.0 - 1.0 mg/dL   Nitrite NEGATIVE  NEGATIVE   Leukocytes, UA NEGATIVE  NEGATIVE  URINE MICROSCOPIC-ADD ON     Status: Abnormal   Collection Time    08/20/13  6:20 PM      Result Value Ref Range   Squamous Epithelial / LPF FEW (*) RARE   WBC, UA 0-2  <3 WBC/hpf   RBC / HPF 3-6  <3 RBC/hpf   Bacteria, UA FEW (*) RARE  CBG MONITORING, ED     Status: None   Collection Time    08/20/13  6:29 PM      Result Value Ref Range   Glucose-Capillary 82  70 - 99 mg/dL  CBC WITH DIFFERENTIAL     Status: Abnormal   Collection Time    08/20/13  6:30 PM      Result Value Ref Range   WBC 9.0  4.0 - 10.5 K/uL   RBC 5.18  4.22 - 5.81 MIL/uL   Hemoglobin 14.4  13.0 - 17.0 g/dL   HCT 43.8  39.0 - 52.0 %   MCV 84.6  78.0 - 100.0 fL   MCH 27.8  26.0 - 34.0 pg   MCHC 32.9  30.0 - 36.0 g/dL   RDW 13.4  11.5 - 15.5 %   Platelets 201  150 - 400 K/uL   Neutrophils Relative % 84 (*) 43 - 77 %   Neutro Abs 7.6  1.7 - 7.7 K/uL   Lymphocytes Relative 8 (*) 12 - 46 %   Lymphs Abs 0.7  0.7 - 4.0 K/uL   Monocytes Relative 7  3 - 12 %   Monocytes Absolute 0.6  0.1 - 1.0 K/uL   Eosinophils Relative 0  0 - 5 %   Eosinophils Absolute 0.0  0.0 - 0.7 K/uL   Basophils Relative 0  0 - 1 %   Basophils Absolute 0.0  0.0 - 0.1 K/uL  COMPREHENSIVE METABOLIC PANEL     Status: Abnormal   Collection Time     08/20/13  6:30 PM      Result Value Ref Range   Sodium 142  137 - 147 mEq/L   Potassium 4.4  3.7 - 5.3  mEq/L   Chloride 100  96 - 112 mEq/L   CO2 26  19 - 32 mEq/L   Glucose, Bld 99  70 - 99 mg/dL   BUN 24 (*) 6 - 23 mg/dL   Creatinine, Ser 1.44 (*) 0.50 - 1.35 mg/dL   Calcium 10.0  8.4 - 10.5 mg/dL   Total Protein 7.7  6.0 - 8.3 g/dL   Albumin 4.0  3.5 - 5.2 g/dL   AST 94 (*) 0 - 37 U/L   ALT 23  0 - 53 U/L   Alkaline Phosphatase 86  39 - 117 U/L   Total Bilirubin 0.8  0.3 - 1.2 mg/dL   GFR calc non Af Amer 43 (*) >90 mL/min   GFR calc Af Amer 50 (*) >90 mL/min   Comment: (NOTE)     The eGFR has been calculated using the CKD EPI equation.     This calculation has not been validated in all clinical situations.     eGFR's persistently <90 mL/min signify possible Chronic Kidney     Disease.  CK     Status: Abnormal   Collection Time    08/20/13  6:30 PM      Result Value Ref Range   Total CK 6248 (*) 7 - 232 U/L  HEMOGLOBIN A1C     Status: Abnormal   Collection Time    08/21/13  5:52 AM      Result Value Ref Range   Hemoglobin A1C 6.0 (*) <5.7 %   Comment: (NOTE)                                                                               According to the ADA Clinical Practice Recommendations for 2011, when     HbA1c is used as a screening test:      >=6.5%   Diagnostic of Diabetes Mellitus               (if abnormal result is confirmed)     5.7-6.4%   Increased risk of developing Diabetes Mellitus     References:Diagnosis and Classification of Diabetes Mellitus,Diabetes     ESPQ,3300,76(AUQJF 1):S62-S69 and Standards of Medical Care in             Diabetes - 2011,Diabetes Care,2011,34 (Suppl 1):S11-S61.   Mean Plasma Glucose 126 (*) <117 mg/dL   Comment: Performed at [facility]     Status: Abnormal   Collection Time    08/21/13  5:55 AM      Result Value Ref Range   Cholesterol 250 (*) 0 - 200 mg/dL   Triglycerides 59  <150 mg/dL   HDL 69  >39  mg/dL   Total CHOL/HDL Ratio 3.6     VLDL 12  0 - 40 mg/dL   LDL Cholesterol 169 (*) 0 - 99 mg/dL   Comment:            Total Cholesterol/HDL:CHD Risk     Coronary Heart Disease Risk Table                         Men   Women  1/2 Average Risk   3.4   3.3      Average Risk       5.0   4.4      2 X Average Risk   9.6   7.1      3 X Average Risk  23.4   11.0                Use the calculated Patient Ratio     above and the CHD Risk Table     to determine the patient's CHD Risk.                ATP III CLASSIFICATION (LDL):      <100     mg/dL   Optimal      100-129  mg/dL   Near or Above                        Optimal      130-159  mg/dL   Borderline      160-189  mg/dL   High      >190     mg/dL   Very High    Dg Chest 2 View  08/20/2013   CLINICAL DATA:  Altered mental status.  Weakness.  EXAM: CHEST  2 VIEW  COMPARISON:  PA and lateral chest 09/10/2012.  FINDINGS: The lungs are clear. Heart size is normal. No pneumothorax or pleural effusion. Thoracic spondylosis noted.  IMPRESSION: No acute disease.   Electronically Signed   By: Inge Rise M.D.   On: 08/20/2013 18:51   Ct Head Wo Contrast  08/20/2013   CLINICAL DATA:  Altered mental status.  EXAM: CT HEAD WITHOUT CONTRAST  TECHNIQUE: Contiguous axial images were obtained from the base of the skull through the vertex without intravenous contrast.  COMPARISON:  None.  FINDINGS: No mass lesion. No midline shift. No acute hemorrhage or hematoma. No extra-axial fluid collections. No evidence of acute infarction. There is slight diffuse cerebral cortical atrophy with secondary slight prominence of the ventricles.  No osseous abnormality.  IMPRESSION: Slight atrophy.  Otherwise normal exam.   Electronically Signed   By: Rozetta Nunnery M.D.   On: 08/20/2013 19:04   Mr [name] Head Wo Contrast  08/21/2013   CLINICAL DATA:  Stroke  EXAM: MRI HEAD WITHOUT CONTRAST  MRA HEAD WITHOUT CONTRAST  TECHNIQUE: Multiplanar, multiecho pulse sequences of  the brain and surrounding structures were obtained without intravenous contrast. Angiographic images of the head were obtained using MRA technique without contrast.  COMPARISON:  CT head 08/20/2013  FINDINGS: MRI HEAD FINDINGS  Mild atrophy. Mild chronic microvascular ischemic changes in the white matter.  Negative for acute infarct. Negative for intracranial hemorrhage. No mass edema or midline shift.  Mild mucosal thickening in the paranasal sinuses.  MRA HEAD FINDINGS  Both vertebral arteries are patent to the basilar without significant stenosis. Right PICA patent. Left PICA not visualized. Basilar widely patent. Posterior cerebral arteries are patent bilaterally. Superior cerebellar arteries are small and not well seen.  Internal carotid artery is patent bilaterally. Decreased signal in the A1 segment bilaterally is felt to be due to artifact. There is decreased signal in the middle cerebral artery branches bilaterally also felt to be artifactual due to tortuosity. No definite intracranial stenosis or aneurysm.  IMPRESSION: Atrophy and mild chronic microvascular ischemic change. No acute infarct.  MRA head reveals decreased signal in the anterior middle cerebral arteries, felt to be artifact  related to tortuosity. No definite intracranial stenosis or occlusion.   Electronically Signed   By: Franchot Gallo M.D.   On: 08/21/2013 12:23   Mr Brain Wo Contrast  08/21/2013   CLINICAL DATA:  Stroke  EXAM: MRI HEAD WITHOUT CONTRAST  MRA HEAD WITHOUT CONTRAST  TECHNIQUE: Multiplanar, multiecho pulse sequences of the brain and surrounding structures were obtained without intravenous contrast. Angiographic images of the head were obtained using MRA technique without contrast.  COMPARISON:  CT head 08/20/2013  FINDINGS: MRI HEAD FINDINGS  Mild atrophy. Mild chronic microvascular ischemic changes in the white matter.  Negative for acute infarct. Negative for intracranial hemorrhage. No mass edema or midline shift.  Mild  mucosal thickening in the paranasal sinuses.  MRA HEAD FINDINGS  Both vertebral arteries are patent to the basilar without significant stenosis. Right PICA patent. Left PICA not visualized. Basilar widely patent. Posterior cerebral arteries are patent bilaterally. Superior cerebellar arteries are small and not well seen.  Internal carotid artery is patent bilaterally. Decreased signal in the A1 segment bilaterally is felt to be due to artifact. There is decreased signal in the middle cerebral artery branches bilaterally also felt to be artifactual due to tortuosity. No definite intracranial stenosis or aneurysm.  IMPRESSION: Atrophy and mild chronic microvascular ischemic change. No acute infarct.  MRA head reveals decreased signal in the anterior middle cerebral arteries, felt to be artifact related to tortuosity. No definite intracranial stenosis or occlusion.   Electronically Signed   By: Franchot Gallo M.D.   On: 08/21/2013 12:23        [place] A. Merlene Perez, M.D.  Diplomate, Tax adviser of Psychiatry and Neurology ( Neurology). 08/21/2013, 8:04 PM

## 2013-08-21 NOTE — Progress Notes (Signed)
SLP Cancellation Note  Patient Details Name: Paul Perez MRN: [id number] DOB: 09/04/1927   Cancelled treatment:       Reason Eval/Treat Not Completed: Patient declined, no reason specified; After multiple attempts for BSE, pt adamantly refused with family present. Family urged him to participate as well, though he would not. Family requested that we begin a regular diet, and stated they would let us know if pt had difficulty. SLP will follow up with pt/family tomorrow to confirm safety of pt's swallowing.    Hyun Reali S Ashan Cueva 08/21/2013, 6:28 PM

## 2013-08-21 NOTE — Evaluation (Signed)
Physical Therapy Evaluation Patient Details Name: Paul Perez MRN: [id number] DOB: 19-Dec-1927 Today's Date: 08/21/2013   History of Present Illness  Paul Perez is a 80 y.o. male with a history of HTN who was sent to the ED after his son had not been able to locate him from the night before, and was found outside in his truck.  He was confused and was not able to recognize people he knew.   He was sent to the ED for evaluation.  A CT scan of the brain was performed and was negative foacute findings. He was found to have a CPK level of 6248.   His son reports that at baseline he is coherent with rare moments of confusion.   Patient does not know why he is here, and denies having any chest pain of headache  Clinical Impression  Pt is an 80 yo who's sone lives with him but is not always home. Pt normally ambulates with a cane.  He demonstrated the ability to ambulate I with a RW at this time.  We will see his safety with ambulation with a cane as well as his ability to complete steps tomorrow.  PT will benefit from skilled therapy as well as [place] at discharge to improve safety of ambulation.    Follow Up Recommendations Home health PT    Equipment Recommendations  Rolling walker with 5" wheels       Precautions / Restrictions Precautions Precautions: None Restrictions Weight Bearing Restrictions: No      Mobility  Bed Mobility Overal bed mobility: Modified Independent         Transfers Overall transfer level: Modified independent      Gait:  Ambulated with rolling walker and Supervision x 300 ft.         Pertinent Vitals/Pain None noted.    Home Living Family/patient expects to be discharged to:: Private residence Living Arrangements: Alone;Children (son lives with pt but is not always there.) Available Help at Discharge: Family Type of Home: House Home Access: Stairs to enter   Technical brewer of Steps: 2 Home Layout: One level Home Equipment: Cane -  single point      Prior Function I with a cane.     Hand Dominance   Dominant Hand: Right    Extremity/Trunk Assessment      Lower Extremity Assessment: Overall WFL for tasks assessed         Communication   Communication: No difficulties  Cognition Arousal/Alertness: Awake/alert   Overall Cognitive Status: Within Functional Limits for tasks assessed                        Assessment/Plan    PT Assessment Patient needs continued PT services  PT Diagnosis Difficulty walking   PT Problem List Decreased balance;Decreased safety awareness  PT Treatment Interventions Gait training;Therapeutic exercise;Patient/family education   PT Goals (Current goals can be found in the Care Plan section) Acute Rehab PT Goals PT Goal Formulation: With patient Time For Goal Achievement: 08/23/13 Potential to Achieve Goals: Good    Frequency Min 3X/week    End of Session Equipment Utilized During Treatment: Gait belt Activity Tolerance: Patient tolerated treatment well Patient left: in chair;with family/visitor present;with call bell/phone within reach           Time: 1344-1414 PT Time Calculation (min): 30 min   Charges:   PT Evaluation $Initial PT Evaluation Tier I: 1 Procedure     PT  G Codes:          Paul Perez 08/21/2013, 2:15 PM

## 2013-08-21 NOTE — Care Management Utilization Note (Signed)
UR completed 

## 2013-08-21 NOTE — Progress Notes (Signed)
  Echocardiogram 2D Echocardiogram has been performed.  Pernella Ackerley Orlean Patten 08/21/2013, 3:39 PM

## 2013-08-21 NOTE — Progress Notes (Signed)
Subjective: The patient is alert this morning but does not realize he is in the hospital. He had been confused at home and unable to recognize people he knew.  Objective: Vital signs in last 24 hours: Temp:  [97.3 F (36.3 C)-98.4 F (36.9 C)] 97.3 F (36.3 C) (04/08 0531) Pulse Rate:  [59-76] 69 (04/08 0531) Resp:  [15-20] 20 (04/08 0531) BP: (125-178)/(74-92) 125/77 mmHg (04/08 0531) SpO2:  [96 %-99 %] 97 % (04/08 0531) Weight:  [80.65 kg (177 lb 12.8 oz)] 80.65 kg (177 lb 12.8 oz) (04/07 2306) Weight change:     Intake/Output from previous day:   Intake/Output this shift:    Physical Exam: HEENT negative  Lungs clear to P&A  Heart regular rhythm no murmurs  Abdomen no palpable organs or masses no organomegaly  Extremities free of edema   Recent Labs  08/20/13 1830  WBC 9.0  HGB 14.4  HCT 43.8  PLT 201   BMET  Recent Labs  08/20/13 1830  NA 142  K 4.4  CL 100  CO2 26  GLUCOSE 99  BUN 24*  CREATININE 1.44*  CALCIUM 10.0    Studies/Results: Dg Chest 2 View  08/20/2013   CLINICAL DATA:  Altered mental status.  Weakness.  EXAM: CHEST  2 VIEW  COMPARISON:  PA and lateral chest 09/10/2012.  FINDINGS: The lungs are clear. Heart size is normal. No pneumothorax or pleural effusion. Thoracic spondylosis noted.  IMPRESSION: No acute disease.   Electronically Signed   By: Inge Rise M.D.   On: 08/20/2013 18:51   Ct Head Wo Contrast  08/20/2013   CLINICAL DATA:  Altered mental status.  EXAM: CT HEAD WITHOUT CONTRAST  TECHNIQUE: Contiguous axial images were obtained from the base of the skull through the vertex without intravenous contrast.  COMPARISON:  None.  FINDINGS: No mass lesion. No midline shift. No acute hemorrhage or hematoma. No extra-axial fluid collections. No evidence of acute infarction. There is slight diffuse cerebral cortical atrophy with secondary slight prominence of the ventricles.  No osseous abnormality.  IMPRESSION: Slight atrophy.   Otherwise normal exam.   Electronically Signed   By: Rozetta Nunnery M.D.   On: 08/20/2013 19:04    Medications:  . aspirin  300 mg Rectal Daily   Or  . aspirin  325 mg Oral Daily  . enoxaparin (LOVENOX) injection  30 mg Subcutaneous Q24H        Assessment/Plan: 1. Acute encephalopathy undetermined cause plan to continue CVA workup 2. Essential hypertension plan to continue hydralazine  3. Rhabdomyolysis, acute kidney injury plan to rehydrate the patient and monitor  4. Urinary tract infection-plan to continue IV Cipro and await urine culture and sensitivity  LOS: 1 day   Miko Markwood G Druanne Bosques 08/21/2013, 6:10 AM

## 2013-08-22 LAB — TSH: TSH: 2.66 u[IU]/mL (ref 0.350–4.500)

## 2013-08-22 LAB — RPR

## 2013-08-22 LAB — HOMOCYSTEINE: Homocysteine: 15.4 umol/L (ref 4.0–15.4)

## 2013-08-22 LAB — VITAMIN B12: Vitamin B-12: 288 pg/mL (ref 211–911)

## 2013-08-22 MED ORDER — LORAZEPAM 2 MG/ML IJ SOLN: 1.0000 mg | Freq: Three times a day (TID) | INTRAMUSCULAR | Status: DC | PRN

## 2013-08-22 MED ORDER — LORAZEPAM 2 MG/ML IJ SOLN
1.0000 mg | Freq: Four times a day (QID) | INTRAMUSCULAR | Status: DC | PRN
  Administered 2013-08-22 – 2013-08-24 (×5): 1 mg via INTRAMUSCULAR
  Filled 2013-08-22 (×5): qty 1

## 2013-08-22 MED ORDER — LORAZEPAM 2 MG/ML IJ SOLN
1.0000 mg | Freq: Three times a day (TID) | INTRAMUSCULAR | Status: DC | PRN
  Administered 2013-08-22 (×2): 1 mg via INTRAVENOUS
  Filled 2013-08-22 (×2): qty 1

## 2013-08-22 MED ORDER — LORAZEPAM 1 MG PO TABS: 1.0000 mg | ORAL_TABLET | Freq: Three times a day (TID) | ORAL | Status: DC | PRN

## 2013-08-22 MED ORDER — LORAZEPAM 1 MG PO TABS: 1.0000 mg | ORAL_TABLET | Freq: Four times a day (QID) | ORAL | Status: DC | PRN

## 2013-08-22 NOTE — Evaluation (Signed)
Clinical/Bedside Swallow Evaluation  Patient Details  Name: Paul Perez MRN: [id number] Date of Birth: 08-10-1927  Today's Date: 08/22/2013 Time: 1700-1727 SLP Time Calculation (min): 27 min  Past Medical History:  Past Medical History  Diagnosis Date  . Hypertension   . Heart problem   . Poor historian    Past Surgical History: History reviewed. No pertinent past surgical history. HPI:  Mr. Lola Dispenza is an 80 year-old black male who presented with acute confusion and disorientation. The patient apparently left [place] and [place] He was sent to emergency room for further evaluation after being discovered by his family. He has been noted to have a dramatically elevated CPK of 6000. There is also evidence of dehydration and borderline UTI. Patient has been confused and irritated. Pt admitted due to acute encephalopathy. This is likely multifactorial particularly from toxic metabolic causes. We will repeat neurological examination. Dementia labs will be obtained. Avoid sedative medication as much as possible. SLP spoke with his son in daughter who report slow changes in memory over the past few years.   Assessment / Plan / Recommendation Clinical Impression  Mr. Senske was seen at bedside with a son and daughter present in room. Pt denies difficulty swallowing, daughter says that he has always coughed- not necessarily related to po intake. Pt was seen with dinner meal which he tolerated without overt signs or symptoms of aspiration. Pt does demonstrate some impulsivity and decreased safety awareness (climbed quickly out of bed to try to to go to bathroom with tangled IV lines etc.). Daughter reports changes in her father's thinking and memory over the past few years, but states that he manages well at home. During this hospitalization he has reportedly been more confused than usual. Pt may benefit from a cognitive evaluation, however I doubt he will readily  participate. The patient has a son that lives with him, but the patient drives and handles his own medications. His daughter reports that he is very independent and [place] time [place] farm. Given pt's current cognitive status, he would need supervision at home. Hopefully, mentation will improve over the next couple of days but home health speech therapy may be beneficial to evaluate Mr. Bidlack in his home environment and assist in setting up his environment as needed. SLP will attempt to see pt tomorrow for cognitive evaluation if pt willing to participate.     Aspiration Risk  Mild    Diet Recommendation Regular;Thin liquid   Liquid Administration via: Cup;Straw Medication Administration: Whole meds with liquid Supervision: Patient able to self feed Postural Changes and/or Swallow Maneuvers: Seated upright 90 degrees;Upright 30-60 min after meal    Other  Recommendations Oral Care Recommendations: Oral care BID Other Recommendations: Clarify dietary restrictions   Follow Up Recommendations  Home health SLP    Frequency and Duration min 2x/week      Pertinent Vitals/Pain     SLP Swallow Goals  No dysphagia intervention indicated at this time.    Swallow Study Prior Functional Status  Type of Home: House Available Help at Discharge: Family    General Date of Onset: 08/20/13 HPI: Mr. Chidozie Bregenzer is an 80 year-old black male who presented with acute confusion and disorientation. The patient apparently left his house and was [place]. He was sent to emergency room for further evaluation after being discovered by his family. He has been noted to have a dramatically elevated CPK of 6000. There  is also evidence of dehydration and borderline UTI. Patient has been confused and irritated. Pt admitted due to acute encephalopathy. This is likely multifactorial particularly from toxic metabolic causes. We will repeat neurological examination. Dementia labs will be  obtained. Avoid sedative medication as much as possible. SLP spoke with his son in daughter who report slow changes in memory over the past few years. Type of Study: Bedside swallow evaluation Diet Prior to this Study: Regular;Thin liquids Temperature Spikes Noted: No Respiratory Status: Room air History of Recent Intubation: No Behavior/Cognition: Alert;Cooperative Oral Cavity - Dentition:  (has a plate) Self-Feeding Abilities: Able to feed self Patient Positioning: Upright in bed Baseline Vocal Quality: Clear Volitional Cough: Strong Volitional Swallow: Unable to elicit    Oral/Motor/Sensory Function Overall Oral Motor/Sensory Function: Appears within functional limits for tasks assessed   Ice Chips Ice chips: Not tested   Thin Liquid Thin Liquid: Within functional limits Presentation: Cup;Straw;Self Fed    Nectar Thick Nectar Thick Liquid: Not tested   Honey Thick Honey Thick Liquid: Not tested   Puree Puree: Within functional limits Presentation: Self Fed;Spoon   Solid       Solid: Within functional limits Presentation: Self Fed      Thank you,  Genene Churn, [place]  Ephraim Hamburger 08/22/2013,6:21 PM

## 2013-08-22 NOTE — Progress Notes (Signed)
Subjective: The patient is more alert but still has some mental confusion. He was seen by neurology yesterday it is noted that he has evidence of dehydration, borderline UTI. He also has rhabdomyolysis. He currently is on IV Cipro 4 urinary tract infection.  Objective: Vital signs in last 24 hours: Temp:  [97.7 F (36.5 C)-98 F (36.7 C)] 98 F (36.7 C) (04/09 0409) Pulse Rate:  [54-69] 54 (04/09 0409) Resp:  [20] 20 (04/09 0409) BP: (113-179)/(70-81) 159/81 mmHg (04/09 0409) SpO2:  [94 %-100 %] 94 % (04/09 0409) Weight change:     Intake/Output from previous day:   Intake/Output this shift:    Physical Exam: H. EENT negative  Lungs clear to P&A  Heart regular rhythm no murmurs  Abdomen the palpable organs or masses Extremities free of edema  Recent Labs  08/20/13 1830  WBC 9.0  HGB 14.4  HCT 43.8  PLT 201   BMET  Recent Labs  08/20/13 1830  NA 142  K 4.4  CL 100  CO2 26  GLUCOSE 99  BUN 24*  CREATININE 1.44*  CALCIUM 10.0    Studies/Results: Dg Chest 2 View  08/20/2013   CLINICAL DATA:  Altered mental status.  Weakness.  EXAM: CHEST  2 VIEW  COMPARISON:  PA and lateral chest 09/10/2012.  FINDINGS: The lungs are clear. Heart size is normal. No pneumothorax or pleural effusion. Thoracic spondylosis noted.  IMPRESSION: No acute disease.   Electronically Signed   By: Inge Rise M.D.   On: 08/20/2013 18:51   Ct Head Wo Contrast  08/20/2013   CLINICAL DATA:  Altered mental status.  EXAM: CT HEAD WITHOUT CONTRAST  TECHNIQUE: Contiguous axial images were obtained from the base of the skull through the vertex without intravenous contrast.  COMPARISON:  None.  FINDINGS: No mass lesion. No midline shift. No acute hemorrhage or hematoma. No extra-axial fluid collections. No evidence of acute infarction. There is slight diffuse cerebral cortical atrophy with secondary slight prominence of the ventricles.  No osseous abnormality.  IMPRESSION: Slight atrophy.   Otherwise normal exam.   Electronically Signed   By: Rozetta Nunnery M.D.   On: 08/20/2013 19:04   Mr [name] Head Wo Contrast  08/21/2013   CLINICAL DATA:  Stroke  EXAM: MRI HEAD WITHOUT CONTRAST  MRA HEAD WITHOUT CONTRAST  TECHNIQUE: Multiplanar, multiecho pulse sequences of the brain and surrounding structures were obtained without intravenous contrast. Angiographic images of the head were obtained using MRA technique without contrast.  COMPARISON:  CT head 08/20/2013  FINDINGS: MRI HEAD FINDINGS  Mild atrophy. Mild chronic microvascular ischemic changes in the white matter.  Negative for acute infarct. Negative for intracranial hemorrhage. No mass edema or midline shift.  Mild mucosal thickening in the paranasal sinuses.  MRA HEAD FINDINGS  Both vertebral arteries are patent to the basilar without significant stenosis. Right PICA patent. Left PICA not visualized. Basilar widely patent. Posterior cerebral arteries are patent bilaterally. Superior cerebellar arteries are small and not well seen.  Internal carotid artery is patent bilaterally. Decreased signal in the A1 segment bilaterally is felt to be due to artifact. There is decreased signal in the middle cerebral artery branches bilaterally also felt to be artifactual due to tortuosity. No definite intracranial stenosis or aneurysm.  IMPRESSION: Atrophy and mild chronic microvascular ischemic change. No acute infarct.  MRA head reveals decreased signal in the anterior middle cerebral arteries, felt to be artifact related to tortuosity. No definite intracranial stenosis or occlusion.  Electronically Signed   By: Franchot Gallo M.D.   On: 08/21/2013 12:23   Mr Brain Wo Contrast  08/21/2013   CLINICAL DATA:  Stroke  EXAM: MRI HEAD WITHOUT CONTRAST  MRA HEAD WITHOUT CONTRAST  TECHNIQUE: Multiplanar, multiecho pulse sequences of the brain and surrounding structures were obtained without intravenous contrast. Angiographic images of the head were obtained using MRA  technique without contrast.  COMPARISON:  CT head 08/20/2013  FINDINGS: MRI HEAD FINDINGS  Mild atrophy. Mild chronic microvascular ischemic changes in the white matter.  Negative for acute infarct. Negative for intracranial hemorrhage. No mass edema or midline shift.  Mild mucosal thickening in the paranasal sinuses.  MRA HEAD FINDINGS  Both vertebral arteries are patent to the basilar without significant stenosis. Right PICA patent. Left PICA not visualized. Basilar widely patent. Posterior cerebral arteries are patent bilaterally. Superior cerebellar arteries are small and not well seen.  Internal carotid artery is patent bilaterally. Decreased signal in the A1 segment bilaterally is felt to be due to artifact. There is decreased signal in the middle cerebral artery branches bilaterally also felt to be artifactual due to tortuosity. No definite intracranial stenosis or aneurysm.  IMPRESSION: Atrophy and mild chronic microvascular ischemic change. No acute infarct.  MRA head reveals decreased signal in the anterior middle cerebral arteries, felt to be artifact related to tortuosity. No definite intracranial stenosis or occlusion.   Electronically Signed   By: Franchot Gallo M.D.   On: 08/21/2013 12:23    Medications:  . aspirin  300 mg Rectal Daily   Or  . aspirin  325 mg Oral Daily  . ciprofloxacin  500 mg Oral BID  . enoxaparin (LOVENOX) injection  30 mg Subcutaneous Q24H    . dextrose 5 % and 0.45% NaCl 100 mL/hr at 08/22/13 0311     Assessment/Plan: 1. Acute encephalopathy of undetermined cause plan to continue CVA workup  2. EssentiPatient will benefit from ongoing skilled PT services in  to continue to advance safe functional mobility, address ongoing impairments in , and minimize fall risk.al hypertension plan to continue hydralazine   Urinary tract infection to continue IV Cipro and await urine culture and sensitivity.   LOS: 2 days   Deamber Buckhalter G Lajuan Kovaleski 08/22/2013, 6:19 AM

## 2013-08-22 NOTE — Evaluation (Addendum)
Occupational Therapy Evaluation Patient Details Name: Paul Perez MRN: [id number] DOB: 01/11/1928 Today's Date: 08/22/2013    History of Present Illness Paul Perez is a 80 y.o. male with a history of HTN who was sent to the ED after his son had not been able to locate him from the night before, and was found outside in his truck.  He was confused and was not able to recognize people he knew.   He was sent to the ED for evaluation.  A CT scan of the brain was performed and was negative foacute findings. He was found to have a CPK level of 6248.   His son reports that at baseline he is coherent with rare moments of confusion.   Patient does not know why he is here, and denies having any chest pain of headache   Clinical Impression   Pt is presenting to acute OT with the above situation.  He currently has good strength/ROM in his UE, but remains in a confused state.  His cognition level limits his ability to live alone.  Recommend HHOT for home safety evaluation and caregiver training and 24/7 supervision at home. Discussed with son and he is currently trying to determine how to make this happen.  Pt does not need further acute OT services at this time.    Follow Up Recommendations  Home health OT    Equipment Recommendations   (Defer to next venue of care)    Recommendations for Other Services       Precautions / Restrictions Precautions Precautions: None Restrictions Weight Bearing Restrictions: No      Mobility Bed Mobility Overal bed mobility: Modified Independent                Transfers                      Balance                                            ADL Overall ADL's : Needs assistance/impaired Eating/Feeding: Supervision/ safety   Grooming: Supervision/safety       Lower Body Bathing: Supervison/ safety                         General ADL Comments: Pt has physicla abilites to complete all necessary  ADL/IADL tasks, but his current confusion/cofnition level is a limiting factor in his ability to do so independenlty.     Vision                     Perception     Praxis      Pertinent Vitals/Pain      Hand Dominance Right   Extremity/Trunk Assessment Upper Extremity Assessment Upper Extremity Assessment: Overall WFL for tasks assessed   Lower Extremity Assessment Lower Extremity Assessment: Defer to PT evaluation       Communication Communication Communication: No difficulties   Cognition Arousal/Alertness: Awake/alert Behavior During Therapy: WFL for tasks assessed/performed Overall Cognitive Status: Impaired/Different from baseline Area of Impairment: Orientation;Attention;Safety/judgement Orientation Level: Disoriented to;Situation;Place Current Attention Level: Selective     Safety/Judgement: Decreased awareness of deficits         General Comments       Exercises       Shoulder Instructions      Home  Living Family/patient expects to be discharged to:: Private residence Living Arrangements: Alone;Children Available Help at Discharge: Family Type of Home: House       Home Layout: One level     Bathroom Shower/Tub: Teacher, early years/pre: Standard     Home Equipment: Cane - single point;Walker - 2 wheels;Grab bars - toilet          Prior Functioning/Environment Level of Independence: Independent with assistive device(s)             OT Diagnosis: Cognitive deficits   OT Problem List: Decreased cognition;Decreased safety awareness   OT Treatment/Interventions:      OT Goals(Current goals can be found in the care plan section) Acute Rehab OT Goals Patient Stated Goal: none stated  OT Frequency:     Barriers to D/C:            Co-evaluation              End of Session    Activity Tolerance: Patient tolerated treatment well Patient left: in bed;with nursing/sitter in room;with family/visitor  present;with call bell/phone within reach;with bed alarm set   Time: 1401-1430 OT Time Calculation (min): 29 min Charges:  OT General Charges $OT Visit: 1 Procedure OT Evaluation $Initial OT Evaluation Tier I: 1 Procedure G-Codes:     Bea Graff, MS, OTR/L [phone] 08/22/2013, 4:26 PM

## 2013-08-22 NOTE — Progress Notes (Signed)
Patient ID: Paul Perez, male   DOB: 11-09-1927, 80 y.o.   MRN: [id number]   [place] A. Merlene Laughter, MD     [email or web address]          Paul Perez is an 80 y.o. male.   Assessment/Plan: 1. Acute encephalopathy. This is likely multifactorial particularly from toxic metabolic causes. We will repeat neurological examination. Dementia labs will OK. Avoid sedative medication as much as possible.    The nursing staff reports that the patient has been paranoid but less agitated. He was quite drowsy with some last night status post Ativan for agitation.  GENERAL: The patient is in no acute distress.  HEENT: Supple. Atraumatic normocephalic.  ABDOMEN: soft  EXTREMITIES: No edema  BACK: Normal.  SKIN: Normal by inspection.  MENTAL STATUS: He is awake and alert. He is being fed his breakfast actually seen on his own volition. He is oriented to person and hospital. He follows commands most times. CRANIAL NERVES: Pupils are equal, round and reactive to light; extra ocular movements are full- Passively, there is no significant nystagmus; are full; upper and lower facial muscles are normal in strength and symmetric, there is no flattening of the nasolabial folds.  MOTOR: He does seem to move all 4 extremities fairly well.  COORDINATION: There are no tremors or dysmetria.  SENSATION: Normal to painful stimuli.     Objective: Vital signs in last 24 hours: Temp:  [97.9 F (36.6 C)-98 F (36.7 C)] 98 F (36.7 C) (04/09 0409) Pulse Rate:  [54-69] 54 (04/09 0409) Resp:  [20] 20 (04/09 0409) BP: (113-179)/(70-81) 159/81 mmHg (04/09 0409) SpO2:  [94 %-99 %] 94 % (04/09 0409)  Intake/Output from previous day:   Intake/Output this shift:   Nutritional status: Carb Control   Lab Results: Results for orders placed during the hospital encounter of 08/20/13 (from the past 48 hour(s))  URINALYSIS, ROUTINE W REFLEX MICROSCOPIC     Status: Abnormal   Collection Time     08/20/13  6:20 PM      Result Value Ref Range   Color, Urine YELLOW  YELLOW   APPearance HAZY (*) CLEAR   Specific Gravity, Urine >1.030 (*) 1.005 - 1.030   pH 6.0  5.0 - 8.0   Glucose, UA NEGATIVE  NEGATIVE mg/dL   Hgb urine dipstick LARGE (*) NEGATIVE   Bilirubin Urine NEGATIVE  NEGATIVE   Ketones, ur TRACE (*) NEGATIVE mg/dL   Protein, ur >300 (*) NEGATIVE mg/dL   Urobilinogen, UA 0.2  0.0 - 1.0 mg/dL   Nitrite NEGATIVE  NEGATIVE   Leukocytes, UA NEGATIVE  NEGATIVE  URINE MICROSCOPIC-ADD ON     Status: Abnormal   Collection Time    08/20/13  6:20 PM      Result Value Ref Range   Squamous Epithelial / LPF FEW (*) RARE   WBC, UA 0-2  <3 WBC/hpf   RBC / HPF 3-6  <3 RBC/hpf   Bacteria, UA FEW (*) RARE  CBG MONITORING, ED     Status: None   Collection Time    08/20/13  6:29 PM      Result Value Ref Range   Glucose-Capillary 82  70 - 99 mg/dL  CBC WITH DIFFERENTIAL     Status: Abnormal   Collection Time    08/20/13  6:30 PM      Result Value Ref Range   WBC 9.0  4.0 - 10.5 K/uL   RBC 5.18  4.22 - 5.81 MIL/uL  Hemoglobin 14.4  13.0 - 17.0 g/dL   HCT 43.8  39.0 - 52.0 %   MCV 84.6  78.0 - 100.0 fL   MCH 27.8  26.0 - 34.0 pg   MCHC 32.9  30.0 - 36.0 g/dL   RDW 13.4  11.5 - 15.5 %   Platelets 201  150 - 400 K/uL   Neutrophils Relative % 84 (*) 43 - 77 %   Neutro Abs 7.6  1.7 - 7.7 K/uL   Lymphocytes Relative 8 (*) 12 - 46 %   Lymphs Abs 0.7  0.7 - 4.0 K/uL   Monocytes Relative 7  3 - 12 %   Monocytes Absolute 0.6  0.1 - 1.0 K/uL   Eosinophils Relative 0  0 - 5 %   Eosinophils Absolute 0.0  0.0 - 0.7 K/uL   Basophils Relative 0  0 - 1 %   Basophils Absolute 0.0  0.0 - 0.1 K/uL  COMPREHENSIVE METABOLIC PANEL     Status: Abnormal   Collection Time    08/20/13  6:30 PM      Result Value Ref Range   Sodium 142  137 - 147 mEq/L   Potassium 4.4  3.7 - 5.3 mEq/L   Chloride 100  96 - 112 mEq/L   CO2 26  19 - 32 mEq/L   Glucose, Bld 99  70 - 99 mg/dL   BUN 24 (*) 6 - 23  mg/dL   Creatinine, Ser 1.44 (*) 0.50 - 1.35 mg/dL   Calcium 10.0  8.4 - 10.5 mg/dL   Total Protein 7.7  6.0 - 8.3 g/dL   Albumin 4.0  3.5 - 5.2 g/dL   AST 94 (*) 0 - 37 U/L   ALT 23  0 - 53 U/L   Alkaline Phosphatase 86  39 - 117 U/L   Total Bilirubin 0.8  0.3 - 1.2 mg/dL   GFR calc non Af Amer 43 (*) >90 mL/min   GFR calc Af Amer 50 (*) >90 mL/min   Comment: (NOTE)     The eGFR has been calculated using the CKD EPI equation.     This calculation has not been validated in all clinical situations.     eGFR's persistently <90 mL/min signify possible Chronic Kidney     Disease.  CK     Status: Abnormal   Collection Time    08/20/13  6:30 PM      Result Value Ref Range   Total CK 6248 (*) 7 - 232 U/L  HEMOGLOBIN A1C     Status: Abnormal   Collection Time    08/21/13  5:52 AM      Result Value Ref Range   Hemoglobin A1C 6.0 (*) <5.7 %   Comment: (NOTE)                                                                               According to the ADA Clinical Practice Recommendations for 2011, when     HbA1c is used as a screening test:      >=6.5%   Diagnostic of Diabetes Mellitus               (if abnormal  result is confirmed)     5.7-6.4%   Increased risk of developing Diabetes Mellitus     References:Diagnosis and Classification of Diabetes Mellitus,Diabetes     HTDS,2876,81(LXBWI 1):S62-S69 and Standards of Medical Care in             Diabetes - 2011,Diabetes OMBT,5974,16 (Suppl 1):S11-S61.   Mean Plasma Glucose 126 (*) <117 mg/dL   Comment: Performed at [place]     Status: Abnormal   Collection Time    08/21/13  5:55 AM      Result Value Ref Range   Cholesterol 250 (*) 0 - 200 mg/dL   Triglycerides 59  <150 mg/dL   HDL 69  >39 mg/dL   Total CHOL/HDL Ratio 3.6     VLDL 12  0 - 40 mg/dL   LDL Cholesterol 169 (*) 0 - 99 mg/dL   Comment:            Total Cholesterol/HDL:CHD Risk     Coronary Heart Disease Risk Table                         Men    Women      1/2 Average Risk   3.4   3.3      Average Risk       5.0   4.4      2 X Average Risk   9.6   7.1      3 X Average Risk  23.4   11.0                Use the calculated Patient Ratio     above and the CHD Risk Table     to determine the patient's CHD Risk.                ATP III CLASSIFICATION (LDL):      <100     mg/dL   Optimal      100-129  mg/dL   Near or Above                        Optimal      130-159  mg/dL   Borderline      160-189  mg/dL   High      >190     mg/dL   Very High    Lipid Panel  Recent Labs  08/21/13 0555  CHOL 250*  TRIG 59  HDL 69  CHOLHDL 3.6  VLDL 12  LDLCALC 169*    Studies/Results: Dg Chest 2 View  08/20/2013   CLINICAL DATA:  Altered mental status.  Weakness.  EXAM: CHEST  2 VIEW  COMPARISON:  PA and lateral chest 09/10/2012.  FINDINGS: The lungs are clear. Heart size is normal. No pneumothorax or pleural effusion. Thoracic spondylosis noted.  IMPRESSION: No acute disease.   Electronically Signed   By: Inge Rise M.D.   On: 08/20/2013 18:51   Ct Head Wo Contrast  08/20/2013   CLINICAL DATA:  Altered mental status.  EXAM: CT HEAD WITHOUT CONTRAST  TECHNIQUE: Contiguous axial images were obtained from the base of the skull through the vertex without intravenous contrast.  COMPARISON:  None.  FINDINGS: No mass lesion. No midline shift. No acute hemorrhage or hematoma. No extra-axial fluid collections. No evidence of acute infarction. There is slight diffuse cerebral cortical atrophy with secondary slight prominence of the ventricles.  No osseous  abnormality.  IMPRESSION: Slight atrophy.  Otherwise normal exam.   Electronically Signed   By: Rozetta Nunnery M.D.   On: 08/20/2013 19:04   Mr [name] Head Wo Contrast  08/21/2013   CLINICAL DATA:  Stroke  EXAM: MRI HEAD WITHOUT CONTRAST  MRA HEAD WITHOUT CONTRAST  TECHNIQUE: Multiplanar, multiecho pulse sequences of the brain and surrounding structures were obtained without intravenous contrast.  Angiographic images of the head were obtained using MRA technique without contrast.  COMPARISON:  CT head 08/20/2013  FINDINGS: MRI HEAD FINDINGS  Mild atrophy. Mild chronic microvascular ischemic changes in the white matter.  Negative for acute infarct. Negative for intracranial hemorrhage. No mass edema or midline shift.  Mild mucosal thickening in the paranasal sinuses.  MRA HEAD FINDINGS  Both vertebral arteries are patent to the basilar without significant stenosis. Right PICA patent. Left PICA not visualized. Basilar widely patent. Posterior cerebral arteries are patent bilaterally. Superior cerebellar arteries are small and not well seen.  Internal carotid artery is patent bilaterally. Decreased signal in the A1 segment bilaterally is felt to be due to artifact. There is decreased signal in the middle cerebral artery branches bilaterally also felt to be artifactual due to tortuosity. No definite intracranial stenosis or aneurysm.  IMPRESSION: Atrophy and mild chronic microvascular ischemic change. No acute infarct.  MRA head reveals decreased signal in the anterior middle cerebral arteries, felt to be artifact related to tortuosity. No definite intracranial stenosis or occlusion.   Electronically Signed   By: Franchot Gallo M.D.   On: 08/21/2013 12:23   Mr Brain Wo Contrast  08/21/2013   CLINICAL DATA:  Stroke  EXAM: MRI HEAD WITHOUT CONTRAST  MRA HEAD WITHOUT CONTRAST  TECHNIQUE: Multiplanar, multiecho pulse sequences of the brain and surrounding structures were obtained without intravenous contrast. Angiographic images of the head were obtained using MRA technique without contrast.  COMPARISON:  CT head 08/20/2013  FINDINGS: MRI HEAD FINDINGS  Mild atrophy. Mild chronic microvascular ischemic changes in the white matter.  Negative for acute infarct. Negative for intracranial hemorrhage. No mass edema or midline shift.  Mild mucosal thickening in the paranasal sinuses.  MRA HEAD FINDINGS  Both vertebral  arteries are patent to the basilar without significant stenosis. Right PICA patent. Left PICA not visualized. Basilar widely patent. Posterior cerebral arteries are patent bilaterally. Superior cerebellar arteries are small and not well seen.  Internal carotid artery is patent bilaterally. Decreased signal in the A1 segment bilaterally is felt to be due to artifact. There is decreased signal in the middle cerebral artery branches bilaterally also felt to be artifactual due to tortuosity. No definite intracranial stenosis or aneurysm.  IMPRESSION: Atrophy and mild chronic microvascular ischemic change. No acute infarct.  MRA head reveals decreased signal in the anterior middle cerebral arteries, felt to be artifact related to tortuosity. No definite intracranial stenosis or occlusion.   Electronically Signed   By: Franchot Gallo M.D.   On: 08/21/2013 12:23    Medications:  Scheduled Meds: . aspirin  300 mg Rectal Daily   Or  . aspirin  325 mg Oral Daily  . ciprofloxacin  500 mg Oral BID  . enoxaparin (LOVENOX) injection  30 mg Subcutaneous Q24H   Continuous Infusions: . dextrose 5 % and 0.45% NaCl 100 mL/hr at 08/22/13 0311   PRN Meds:.LORazepam, senna-docusate     LOS: 2 days   Amadi Frady A. Merlene Laughter, M.D.  Diplomate, Tax adviser of Psychiatry and Neurology ( Neurology).

## 2013-08-23 LAB — URINE CULTURE: Colony Count: 5000

## 2013-08-23 LAB — GLUCOSE, CAPILLARY
GLUCOSE-CAPILLARY: 97 mg/dL (ref 70–99)
Glucose-Capillary: 112 mg/dL — ABNORMAL HIGH (ref 70–99)
Glucose-Capillary: 166 mg/dL — ABNORMAL HIGH (ref 70–99)
Glucose-Capillary: 127 mg/dL — ABNORMAL HIGH (ref 70–99)

## 2013-08-23 MED ORDER — INSULIN ASPART 100 UNIT/ML ~~LOC~~ SOLN
0.0000 [IU] | Freq: Three times a day (TID) | SUBCUTANEOUS | Status: DC
Start: 2013-08-23 — End: 2013-08-29
  Administered 2013-08-23: 3 [IU] via SUBCUTANEOUS
  Administered 2013-08-24 (×2): 2 [IU] via SUBCUTANEOUS
  Administered 2013-08-25: 3 [IU] via SUBCUTANEOUS
  Administered 2013-08-26 – 2013-08-29 (×5): 2 [IU] via SUBCUTANEOUS

## 2013-08-23 MED ORDER — CYANOCOBALAMIN 1000 MCG/ML IJ SOLN
1000.0000 ug | Freq: Once | INTRAMUSCULAR | Status: AC
  Administered 2013-08-23: 1000 ug via INTRAMUSCULAR
  Filled 2013-08-23: qty 1

## 2013-08-23 MED ORDER — INSULIN ASPART 100 UNIT/ML ~~LOC~~ SOLN: 0.0000 [IU] | Freq: Every day | SUBCUTANEOUS | Status: DC

## 2013-08-23 NOTE — Care Management Note (Addendum)
    Page 1 of 2   08/27/2013     12:30:00 PM   CARE MANAGEMENT NOTE 08/27/2013  Patient:  Paul Perez, Paul Perez   Account Number:  [id number]  Date Initiated:  08/23/2013  Documentation initiated by:  Claretha Cooper  Subjective/Objective Assessment:   Pt lives at home alone. PT has recommended HH PT and a walker. [place] RN and PT from [facility] at DC     Action/Plan:   Gilford Rile is in the CM day office   Anticipated DC Date:     Anticipated DC Plan:  [place]  CM consult      PAC Choice  [place]   Choice offered to / List presented to:     DME arranged  [place]      DME agency  [place] arranged  HH-1 RN  [place]      [place].   Status of service:  Completed, signed off Medicare Important Message given?  YES (If response is "NO", the following Medicare IM given date fields will be blank) Date Medicare IM given:  08/26/2013 Date Additional Medicare IM given:    Discharge Disposition:  [place]  Per UR Regulation:    If discussed at Long Length of Stay Meetings, dates discussed:   08/27/2013    Comments:  08/23/13 Claretha Cooper RN BSN CM

## 2013-08-23 NOTE — Progress Notes (Signed)
Subjective: The patient had a restless night pulled out his IV and was mentally confused. His thought to have evidence of dehydration possible urinary tract infection and rhabdomyolysis. He currently has the treatment for UTI with IV Cipro  Objective: Vital signs in last 24 hours: Temp:  [97.4 F (36.3 C)-98.1 F (36.7 C)] 97.4 F (36.3 C) (04/10 [id number]) Pulse Rate:  [62-103] 103 (04/10 0608) Resp:  [20] 20 (04/10 0608) BP: (158-209)/(74-91) 180/83 mmHg (04/10 0608) SpO2:  [98 %-100 %] 98 % (04/10 [id number]) Weight change:     Intake/Output from previous day: 04/09 0701 - 04/10 0700 In: 530 [P.O.:530] Out: 700 [Urine:700] Intake/Output this shift: Total I/O In: -  Out: 500 [Urine:500]  Physical Exam: HEENT negative  Neck supple no JVD or thyroid abnormalities  Heart regular rhythm no murmurs  Lungs clear to P&A  Abdomen the palpable organs or masses  Extremities free of edema   Recent Labs  08/20/13 1830  WBC 9.0  HGB 14.4  HCT 43.8  PLT 201   BMET  Recent Labs  08/20/13 1830  NA 142  K 4.4  CL 100  CO2 26  GLUCOSE 99  BUN 24*  CREATININE 1.44*  CALCIUM 10.0    Studies/Results: Mr Virgel [place] Wo Contrast  08/21/2013   CLINICAL DATA:  Stroke  EXAM: MRI HEAD WITHOUT CONTRAST  MRA HEAD WITHOUT CONTRAST  TECHNIQUE: Multiplanar, multiecho pulse sequences of the brain and surrounding structures were obtained without intravenous contrast. Angiographic images of the head were obtained using MRA technique without contrast.  COMPARISON:  CT head 08/20/2013  FINDINGS: MRI HEAD FINDINGS  Mild atrophy. Mild chronic microvascular ischemic changes in the white matter.  Negative for acute infarct. Negative for intracranial hemorrhage. No mass edema or midline shift.  Mild mucosal thickening in the paranasal sinuses.  MRA HEAD FINDINGS  Both vertebral arteries are patent to the basilar without significant stenosis. Right PICA patent. Left PICA not visualized. Basilar widely  patent. Posterior cerebral arteries are patent bilaterally. Superior cerebellar arteries are small and not well seen.  Internal carotid artery is patent bilaterally. Decreased signal in the A1 segment bilaterally is felt to be due to artifact. There is decreased signal in the middle cerebral artery branches bilaterally also felt to be artifactual due to tortuosity. No definite intracranial stenosis or aneurysm.  IMPRESSION: Atrophy and mild chronic microvascular ischemic change. No acute infarct.  MRA head reveals decreased signal in the anterior middle cerebral arteries, felt to be artifact related to tortuosity. No definite intracranial stenosis or occlusion.   Electronically Signed   By: Franchot Gallo M.D.   On: 08/21/2013 12:23   Mr Brain Wo Contrast  08/21/2013   CLINICAL DATA:  Stroke  EXAM: MRI HEAD WITHOUT CONTRAST  MRA HEAD WITHOUT CONTRAST  TECHNIQUE: Multiplanar, multiecho pulse sequences of the brain and surrounding structures were obtained without intravenous contrast. Angiographic images of the head were obtained using MRA technique without contrast.  COMPARISON:  CT head 08/20/2013  FINDINGS: MRI HEAD FINDINGS  Mild atrophy. Mild chronic microvascular ischemic changes in the white matter.  Negative for acute infarct. Negative for intracranial hemorrhage. No mass edema or midline shift.  Mild mucosal thickening in the paranasal sinuses.  MRA HEAD FINDINGS  Both vertebral arteries are patent to the basilar without significant stenosis. Right PICA patent. Left PICA not visualized. Basilar widely patent. Posterior cerebral arteries are patent bilaterally. Superior cerebellar arteries are small and not well seen.  Internal carotid artery is patent  bilaterally. Decreased signal in the A1 segment bilaterally is felt to be due to artifact. There is decreased signal in the middle cerebral artery branches bilaterally also felt to be artifactual due to tortuosity. No definite intracranial stenosis or  aneurysm.  IMPRESSION: Atrophy and mild chronic microvascular ischemic change. No acute infarct.  MRA head reveals decreased signal in the anterior middle cerebral arteries, felt to be artifact related to tortuosity. No definite intracranial stenosis or occlusion.   Electronically Signed   By: Franchot Gallo M.D.   On: 08/21/2013 12:23    Medications:  . aspirin  300 mg Rectal Daily   Or  . aspirin  325 mg Oral Daily  . ciprofloxacin  500 mg Oral BID  . enoxaparin (LOVENOX) injection  30 mg Subcutaneous Q24H  . insulin aspart  0-15 Units Subcutaneous TID WC  . insulin aspart  0-5 Units Subcutaneous QHS    . dextrose 5 % and 0.45% NaCl 100 mL/hr at 08/22/13 0311     Assessment/Plan: 1. Acute encephalopathy, rhabdomyolysis etiology undetermined 2. Urinary tract infection plan to continue Cipro await urine culture and sensitivity the patient will be seen again by neurology today  LOS: 3 days   Paul Perez 08/23/2013, 6:13 AM

## 2013-08-23 NOTE — Progress Notes (Signed)
Patient ID: Paul Perez, male   DOB: [birth year], 80 y.o.   MRN: [id number]   [place] A. Merlene Laughter, MD     [email or web address]          KELLEE KNEECE is an 80 y.o. male.   Assessment/Plan: 1. Acute encephalopathy. This is likely multifactorial particularly from toxic metabolic causes. We will repeat neurological examination. Dementia labs will OK. Avoid sedative medication as much as possible. Vit B12 lower side will replaced x 1.   The nursing staff reports that the patient has been paranoid but less agitated.   GENERAL: The patient is in no acute distress.  HEENT: Supple. Atraumatic normocephalic.  ABDOMEN: soft  EXTREMITIES: No edema  BACK: Normal.  SKIN: Normal by inspection.  MENTAL STATUS: He is sleeping but easily arousable. He continues to remain disoriented and remains confused. 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 CRANIAL NERVES: Pupils are equal, round and reactive to light; extra ocular movements are full- Passively, there is no significant nystagmus; are full; upper and lower facial muscles are normal in strength and symmetric, there is no flattening of the nasolabial folds.  MOTOR: He does seem to move all 4 extremities fairly well.  COORDINATION: There are no tremors or dysmetria.  SENSATION: Normal to painful stimuli.     Objective: Vital signs in last 24 hours: Temp:  [97.4 F (36.3 C)-98.1 F (36.7 C)] 97.4 F (36.3 C) (04/10 [id number]) Pulse Rate:  [62-103] 103 (04/10 0608) Resp:  [20] 20 (04/10 0608) BP: (158-209)/(74-91) 180/83 mmHg (04/10 0608) SpO2:  [98 %-100 %] 98 % (04/10 0608)  Intake/Output from previous day: 04/09 0701 - 04/10 0700 In: 530 [P.O.:530] Out: 700 [Urine:700] Intake/Output this shift:   Nutritional status: Carb Control   Lab Results: Results for orders placed during the hospital encounter of 08/20/13 (from the past 48 hour(s))  VITAMIN B12     Status: None   Collection  Time    08/22/13  5:54 AM      Result Value Ref Range   Vitamin B-12 288  211 - 911 pg/mL   Comment: Performed at [place] [facility]     Status: None   Collection Time    08/22/13  5:54 AM      Result Value Ref Range   Homocysteine 15.4  4.0 - 15.4 umol/L   Comment: Performed at [facility]  RPR     Status: None   Collection Time    08/22/13  5:54 AM      Result Value Ref Range   RPR NON REAC  NON REAC   Comment: Performed at [facility]  TSH     Status: None   Collection Time    08/22/13  5:56 AM      Result Value Ref Range   TSH 2.660  0.350 - 4.500 uIU/mL   Comment: Please note change in reference range.     Performed at [place], [place]     Status: None   Collection Time    08/23/13  8:25 AM      Result Value Ref Range   Glucose-Capillary 97  70 - 99 mg/dL   Comment 1 Notify RN      Lipid Panel  Recent Labs  08/21/13 0555  CHOL 250*  TRIG 59  HDL 69  CHOLHDL 3.6  VLDL 12  LDLCALC 169*    Studies/Results: Mr [facility] Contrast  08/21/2013   CLINICAL DATA:  Stroke  EXAM: MRI HEAD WITHOUT CONTRAST  MRA HEAD WITHOUT CONTRAST  TECHNIQUE: Multiplanar, multiecho pulse sequences of the brain and surrounding structures were obtained without intravenous contrast. Angiographic images of the head were obtained using MRA technique without contrast.  COMPARISON:  CT head 08/20/2013  FINDINGS: MRI HEAD FINDINGS  Mild atrophy. Mild chronic microvascular ischemic changes in the white matter.  Negative for acute infarct. Negative for intracranial hemorrhage. No mass edema or midline shift.  Mild mucosal thickening in the paranasal sinuses.  MRA HEAD FINDINGS  Both vertebral arteries are patent to the basilar without significant stenosis. Right PICA patent. Left PICA not visualized. Basilar widely patent. Posterior cerebral arteries are patent bilaterally. Superior cerebellar arteries are small and not well seen.  Internal  carotid artery is patent bilaterally. Decreased signal in the A1 segment bilaterally is felt to be due to artifact. There is decreased signal in the middle cerebral artery branches bilaterally also felt to be artifactual due to tortuosity. No definite intracranial stenosis or aneurysm.  IMPRESSION: Atrophy and mild chronic microvascular ischemic change. No acute infarct.  MRA head reveals decreased signal in the anterior middle cerebral arteries, felt to be artifact related to tortuosity. No definite intracranial stenosis or occlusion.   Electronically Signed   By: Franchot Gallo M.D.   On: 08/21/2013 12:23   Mr Brain Wo Contrast  08/21/2013   CLINICAL DATA:  Stroke  EXAM: MRI HEAD WITHOUT CONTRAST  MRA HEAD WITHOUT CONTRAST  TECHNIQUE: Multiplanar, multiecho pulse sequences of the brain and surrounding structures were obtained without intravenous contrast. Angiographic images of the head were obtained using MRA technique without contrast.  COMPARISON:  CT head 08/20/2013  FINDINGS: MRI HEAD FINDINGS  Mild atrophy. Mild chronic microvascular ischemic changes in the white matter.  Negative for acute infarct. Negative for intracranial hemorrhage. No mass edema or midline shift.  Mild mucosal thickening in the paranasal sinuses.  MRA HEAD FINDINGS  Both vertebral arteries are patent to the basilar without significant stenosis. Right PICA patent. Left PICA not visualized. Basilar widely patent. Posterior cerebral arteries are patent bilaterally. Superior cerebellar arteries are small and not well seen.  Internal carotid artery is patent bilaterally. Decreased signal in the A1 segment bilaterally is felt to be due to artifact. There is decreased signal in the middle cerebral artery branches bilaterally also felt to be artifactual due to tortuosity. No definite intracranial stenosis or aneurysm.  IMPRESSION: Atrophy and mild chronic microvascular ischemic change. No acute infarct.  MRA head reveals decreased signal in  the anterior middle cerebral arteries, felt to be artifact related to tortuosity. No definite intracranial stenosis or occlusion.   Electronically Signed   By: Franchot Gallo M.D.   On: 08/21/2013 12:23    Medications:  Scheduled Meds: . aspirin  300 mg Rectal Daily   Or  . aspirin  325 mg Oral Daily  . ciprofloxacin  500 mg Oral BID  . enoxaparin (LOVENOX) injection  30 mg Subcutaneous Q24H  . insulin aspart  0-15 Units Subcutaneous TID WC  . insulin aspart  0-5 Units Subcutaneous QHS   Continuous Infusions: . dextrose 5 % and 0.45% NaCl 100 mL/hr at 08/22/13 0311   PRN Meds:.LORazepam, LORazepam, senna-docusate     LOS: 3 days   Arwa Yero A. Merlene Laughter, M.D.  Diplomate, Tax adviser of Psychiatry and Neurology ( Neurology).
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
## 2013-08-23 NOTE — Progress Notes (Signed)
Paged on call MD about patient getting OOB and pulling at tubes and wires. On call MD renewed patients noviolent restraints.  Security and the [facility] assisted placing the restraints.  The reason for and the criteria for removal was explained to the patient.  Will continue to monitor the patient and removal criteria.

## 2013-08-24 LAB — GLUCOSE, CAPILLARY
GLUCOSE-CAPILLARY: 102 mg/dL — AB (ref 70–99)
GLUCOSE-CAPILLARY: 132 mg/dL — AB (ref 70–99)
Glucose-Capillary: 144 mg/dL — ABNORMAL HIGH (ref 70–99)
Glucose-Capillary: 126 mg/dL — ABNORMAL HIGH (ref 70–99)
Glucose-Capillary: 410 mg/dL — ABNORMAL HIGH (ref 70–99)

## 2013-08-24 MED ORDER — LORAZEPAM 2 MG/ML IJ SOLN
1.0000 mg | INTRAMUSCULAR | Status: DC | PRN
  Administered 2013-08-24 – 2013-08-29 (×12): 1 mg via INTRAVENOUS
  Filled 2013-08-24 (×13): qty 1

## 2013-08-24 NOTE — Progress Notes (Signed)
Patient with increase agitation, Ativan IM 1mg  given at 11am, not due till 5pm. Dr. Luan Pulling notified, new order to discontinue current Ativan order and change it to Ativan IV 1mg  q4hrs PRN.

## 2013-08-24 NOTE — Progress Notes (Signed)
Subjective: The patient is more alert but still has some mental confusion. His been seen by neurology and it is felt that he has evidence of rhabdomyolysis and toxic metabolic problems he remains on Cipro for urinary tract infection  Objective: Vital signs in last 24 hours: Temp:  [97.3 F (36.3 C)-98.1 F (36.7 C)] 97.8 F (36.6 C) (04/11 0721) Pulse Rate:  [54-71] 54 (04/11 0721) Resp:  [20] 20 (04/11 0721) BP: (184-204)/(81-82) 204/82 mmHg (04/11 0721) SpO2:  [97 %-100 %] 100 % (04/11 0721) Weight change:     Intake/Output from previous day: 04/10 0701 - 04/11 0700 In: 120 [P.O.:120] Out: 400 [Urine:400] Intake/Output this shift: Total I/O In: 60 [P.O.:60] Out: 300 [Urine:300]  Physical Exam: HEENT negative  Lungs clear today  Heart regular rhythm no murmurs  Abdomen no palpable organs or masses  Extremities free of edema  No results found for this basename: WBC, HGB, HCT, PLT,  in the last 72 hours BMET No results found for this basename: NA, K, CL, CO2, GLUCOSE, BUN, CREATININE, CALCIUM,  in the last 72 hours  Studies/Results: No results found.  Medications:  . aspirin  300 mg Rectal Daily   Or  . aspirin  325 mg Oral Daily  . ciprofloxacin  500 mg Oral BID  . enoxaparin (LOVENOX) injection  30 mg Subcutaneous Q24H  . insulin aspart  0-15 Units Subcutaneous TID WC  . insulin aspart  0-5 Units Subcutaneous QHS    . dextrose 5 % and 0.45% NaCl 100 mL/hr at 08/24/13 0234     Assessment/Plan: 1. Acute encephalopathy of undetermined cause  2. Urinary tract infection plan to continue current IV Cipro and await urine culture and sensitivity  3. Diabetes mellitus to continue monitoring sugars patient is on sliding scale NovoLog insulin   LOS: 4 days   Ginamarie Banfield G Ezelle Surprenant 08/24/2013, 9:34 AM

## 2013-08-24 NOTE — Progress Notes (Signed)
First blood sugar of 410 was incorrect. Rechecked was 126.

## 2013-08-25 LAB — GLUCOSE, CAPILLARY
GLUCOSE-CAPILLARY: 115 mg/dL — AB (ref 70–99)
GLUCOSE-CAPILLARY: 152 mg/dL — AB (ref 70–99)
GLUCOSE-CAPILLARY: 94 mg/dL (ref 70–99)
Glucose-Capillary: 116 mg/dL — ABNORMAL HIGH (ref 70–99)

## 2013-08-25 NOTE — Progress Notes (Signed)
Subjective: The patient remains mentally confused. He didn't receive IV Ativan through the night and this has calmed some. His urine culture did not reveal organism. He remains on IV Cipro.  Objective: Vital signs in last 24 hours: Temp:  [97.5 F (36.4 C)-98.4 F (36.9 C)] 97.5 F (36.4 C) (04/12 0726) Pulse Rate:  [62-64] 64 (04/12 0726) Resp:  [20] 20 (04/12 0726) BP: (168-185)/(77-96) 168/96 mmHg (04/12 0726) SpO2:  [98 %-99 %] 98 % (04/12 0726) Weight change:     Intake/Output from previous day: 04/11 0701 - 04/12 0700 In: 60 [P.O.:60] Out: 300 [Urine:300] Intake/Output this shift: Total I/O In: -  Out: 650 [Urine:650]  Physical Exam: H. EENT negative  Lungs clear to P&A  Heart regular rhythm no murmurs  Abdomen no palpable organs or masses  Extremities free of edema  No results found for this basename: WBC, HGB, HCT, PLT,  in the last 72 hours BMET No results found for this basename: NA, K, CL, CO2, GLUCOSE, BUN, CREATININE, CALCIUM,  in the last 72 hours  Studies/Results: No results found.  Medications:  . aspirin  300 mg Rectal Daily   Or  . aspirin  325 mg Oral Daily  . ciprofloxacin  500 mg Oral BID  . enoxaparin (LOVENOX) injection  30 mg Subcutaneous Q24H  . insulin aspart  0-15 Units Subcutaneous TID WC  . insulin aspart  0-5 Units Subcutaneous QHS    . dextrose 5 % and 0.45% NaCl 100 mL/hr at 08/24/13 2216   1. Acute encephalopathy of undetermined cause  2. Urinary tract infection plan to continue current IV Cipro   3. Diabetes mellitus2 plan to continue subcutaneous short-acting insulin,, continue to monitor Assessment/Plan:    LOS: 5 days   Briseyda Fehr G Allen Basista 08/25/2013, 9:48 AM

## 2013-08-26 LAB — GLUCOSE, CAPILLARY
GLUCOSE-CAPILLARY: 109 mg/dL — AB (ref 70–99)
GLUCOSE-CAPILLARY: 123 mg/dL — AB (ref 70–99)
Glucose-Capillary: 110 mg/dL — ABNORMAL HIGH (ref 70–99)
Glucose-Capillary: 115 mg/dL — ABNORMAL HIGH (ref 70–99)

## 2013-08-26 MED ORDER — CIPROFLOXACIN HCL 500 MG PO TABS: 500.0000 mg | ORAL_TABLET | Freq: Two times a day (BID) | ORAL | Status: DC

## 2013-08-26 MED ORDER — CLONIDINE HCL 0.2 MG PO TABS
0.2000 mg | ORAL_TABLET | Freq: Four times a day (QID) | ORAL | Status: DC | PRN
  Administered 2013-08-26 – 2013-08-28 (×3): 0.2 mg via ORAL
  Filled 2013-08-26 (×3): qty 1

## 2013-08-26 NOTE — Progress Notes (Signed)
Nutrition Brief Note  RD pulled to chart due to LOS  Wt Readings from Last 15 Encounters:  08/20/13 177 lb 12.8 oz (80.65 kg)    Body mass index is 26.24 kg/(m^2). Patient meets criteria for overweight based on current BMI.   Current diet order is carb modified, patient is consuming approximately 50-100% of meals at this time. Labs and medications reviewed.   No nutrition interventions warranted at this time. If nutrition issues arise, please consult RD.   Paul Perez A. Jimmye Norman, RD, LDN Pager: [phone]

## 2013-08-26 NOTE — Progress Notes (Signed)
Subjective: The patient seems to be more alert but still is having problems of confusion his been treated for dehydration UTI rhabdomyolysis currently is on Cipro  Objective: Vital signs in last 24 hours: Temp:  [97.5 F (36.4 C)-98 F (36.7 C)] 98 F (36.7 C) (04/12 2259) Pulse Rate:  [39-64] 50 (04/12 2259) Resp:  [18-20] 20 (04/12 2259) BP: (154-168)/(49-96) 154/49 mmHg (04/12 2259) SpO2:  [98 %-100 %] 100 % (04/12 2259) Weight change:     Intake/Output from previous day: 04/12 0701 - 04/13 0700 In: -  Out: 675 [Urine:675] Intake/Output this shift: Total I/O In: -  Out: 25 [Urine:25]  Physical Exam: Lungs clear to P&A  Heart regular rhythm no murmurs  Abdomen the palpable organs or masses  Extremities free of edema  No results found for this basename: WBC, HGB, HCT, PLT,  in the last 72 hours BMET No results found for this basename: NA, K, CL, CO2, GLUCOSE, BUN, CREATININE, CALCIUM,  in the last 72 hours  Studies/Results: No results found.  Medications:  . aspirin  300 mg Rectal Daily   Or  . aspirin  325 mg Oral Daily  . ciprofloxacin  500 mg Oral BID  . enoxaparin (LOVENOX) injection  30 mg Subcutaneous Q24H  . insulin aspart  0-15 Units Subcutaneous TID WC  . insulin aspart  0-5 Units Subcutaneous QHS    . dextrose 5 % and 0.45% NaCl 100 mL/hr at 08/25/13 1751     Assessment/Plan: 1. Acute encephalopathy rhabdomyolysis urinary tract infection  2. Diabetes mellitus plan to continue to monitor sugars social services will be contacted regarding possible placement.   LOS: 6 days   Paul Perez 08/26/2013, 6:43 AM

## 2013-08-26 NOTE — Progress Notes (Addendum)
During 1400 rounds, Pt's BP was found to be 210/82 and a HR of 35 BPM. Dr. Everette Rank was notified. Obtained order for EKG, cardiac monitoring, and clonidine prn for SBP. Pt will not be discharged today. Will call MD with results.   1640: Pt Bp improved at 188/70 and HR now in 60s. Pt is in NSR with PACs. Will continue to monitor.

## 2013-08-26 NOTE — Clinical Social Work Psychosocial (Signed)
Clinical Social Work Department BRIEF PSYCHOSOCIAL ASSESSMENT 08/26/2013  Patient:  Paul Perez, Paul Perez     Account Number:  [id number]     Admit date:  08/20/2013  Clinical Social Worker:  Wyatt Haste  Date/Time:  08/26/2013 02:16 PM  Referred by:  Physician  Date Referred:  08/26/2013 Referred for  SNF Placement   Other Referral:   Interview type:  Family Other interview type:   Paul Perez- son    PSYCHOSOCIAL DATA Living Status:  ALONE Admitted from facility:   Level of care:   Primary support name:  Paul Perez Primary support relationship to patient:  CHILD, ADULT Degree of support available:   supportive    CURRENT CONCERNS Current Concerns  Post-Acute Placement   Other Concerns:    SOCIAL WORK ASSESSMENT / PLAN CSW met with pt, and pt's sons at bedside. Pt alert, but pleasantly confused. Was aware of who was visiting him. Pt lives alone and is oriented at baseline per sons. He is generally independent as well. Admitted with acute encephalopathy and UTI. Pt has improved, but still remains confused. PT assessed pt and recommendation was for home health. Pt ambulated 300'. CM discussed d/c with pt's son today on phone. Referral received by MD to see if pt can return home. Sons report they can arrange for around the clock supervision for pt for short term with the hope that he will improve once returning home. Pt has 4 children and all live locally and are able to pitch in to be with him. Son asked about CAP aid. Aware that pt would have to apply for Medicaid first in order to be put on wait list if qualifies. Discussed placement as an option, but children want pt to return home. Requesting full home health services. CM notified of recommendation for home health social worker as well.   Assessment/plan status:  Referral to [employer] Other assessment/ plan:   Information/referral to community resources:   CM for home health  DSS to apply for Medicaid     PATIENT'S/FAMILY'S RESPONSE TO PLAN OF CARE: Pt d/c today. Unsure about disposition this morning, but family can arrange for around the clock supervision short term anyway. CSW will sign off.       Benay Pike, [place]

## 2013-08-27 LAB — GLUCOSE, CAPILLARY
GLUCOSE-CAPILLARY: 130 mg/dL — AB (ref 70–99)
GLUCOSE-CAPILLARY: 122 mg/dL — AB (ref 70–99)
Glucose-Capillary: 124 mg/dL — ABNORMAL HIGH (ref 70–99)
Glucose-Capillary: 111 mg/dL — ABNORMAL HIGH (ref 70–99)

## 2013-08-27 MED ORDER — ENOXAPARIN SODIUM 40 MG/0.4ML ~~LOC~~ SOLN
40.0000 mg | SUBCUTANEOUS | Status: DC
  Administered 2013-08-27 – 2013-08-29 (×3): 40 mg via SUBCUTANEOUS
  Filled 2013-08-27 (×3): qty 0.4

## 2013-08-27 NOTE — Plan of Care (Signed)
Problem: Consults Goal: Diabetes Guidelines if Diabetic/Glucose > 140 If diabetic or lab glucose is > 140 mg/dl - Initiate Diabetes/Hyperglycemia Guidelines & Document Interventions  Outcome: Completed/Met Date Met:  08/27/13 CBG'S less than 140 today,7a-7pm.

## 2013-08-27 NOTE — Progress Notes (Signed)
Subjective: The patient is more alert but still has some mental confusion. His been seen by neurology and has evidence of dehydration borderline urinary tract infection and rhabdomyolysis. He remains on Cipro. He did have period of extreme hypertension yesterday which was treated with clonidine. He did have bradycardia but this normalized.  Objective: Vital signs in last 24 hours: Temp:  [98.4 F (36.9 C)-98.6 F (37 C)] 98.6 F (37 C) (04/13 2004) Pulse Rate:  [35-63] 63 (04/13 2004) Resp:  [20] 20 (04/13 2004) BP: (182-210)/(70-98) 182/98 mmHg (04/13 2004) SpO2:  [100 %] 100 % (04/13 2004) Weight change:     Intake/Output from previous day: 04/13 0701 - 04/14 0700 In: 13620 [P.O.:580; I.V.:13040] Out: 300 [Urine:300] Intake/Output this shift: Total I/O In: 240 [P.O.:240] Out: -   Physical Exam: HEENT negative  Lungs clear to P&A  Heart regular rhythm no murmurs  Abdomen no palpable organs or masses   extremities free of edema  No results found for this basename: WBC, HGB, HCT, PLT,  in the last 72 hours BMET No results found for this basename: NA, K, CL, CO2, GLUCOSE, BUN, CREATININE, CALCIUM,  in the last 72 hours  Studies/Results: No results found.  Medications:  . aspirin  300 mg Rectal Daily   Or  . aspirin  325 mg Oral Daily  . ciprofloxacin  500 mg Oral BID  . enoxaparin (LOVENOX) injection  30 mg Subcutaneous Q24H  . insulin aspart  0-15 Units Subcutaneous TID WC  . insulin aspart  0-5 Units Subcutaneous QHS    . dextrose 5 % and 0.45% NaCl 100 mL/hr at 08/27/13 0042     Assessment/Plan: 1. Acute encephalopathy of undetermined cause  2. Urinary tract infection plan to continue IV Cipro  3. Essential hypertension plan to continue clonidine continue monitor vital signs  LOS: 7 days   Odes Lolli G Kristena Wilhelmi 08/27/2013, 6:24 AM

## 2013-08-27 NOTE — Progress Notes (Signed)
Physical Therapy Treatment Patient Details Name: Paul Perez MRN: [id number] DOB: 11-07-1927 Today's Date: 08/27/2013    PT Comments    Pt is pleasantly confused. He is able to states his name but states his birthday as 07/01/1929. Tx limited by cognitive status. Pt follows commands inconsistently. Therapist able to [facility] flex pt's knee to roughly 20 degrees before pt resists. Pt completes active SLR and hip abd/add with minimal difficulty.                  Exercises General Exercises - Lower Extremity Ankle Circles/Pumps: 10 reps;PROM;Supine Heel Slides: 10 reps;PROM;Supine Hip ABduction/ADduction: 10 reps;AROM;Supine Straight Leg Raises: 10 reps;AROM;Supine        Pertinent Vitals/Pain Pt reports no pain.            End of Session   Activity Tolerance: Other (comment) (tx lmited by cognitive status) Patient left: in bed;with bed alarm set;with nursing/sitter in room     Time: 0930-0950 PT Time Calculation (min): 20 min  Charges:  $Therapeutic Exercise: 8-22 mins                      Rachelle Hora, PTA  08/27/2013, 10:05 AM

## 2013-08-28 ENCOUNTER — Encounter (HOSPITAL_COMMUNITY): Payer: Self-pay | Admitting: Adult Health

## 2013-08-28 DIAGNOSIS — R001 Bradycardia, unspecified: Secondary | ICD-10-CM

## 2013-08-28 DIAGNOSIS — I498 Other specified cardiac arrhythmias: Secondary | ICD-10-CM

## 2013-08-28 DIAGNOSIS — I491 Atrial premature depolarization: Secondary | ICD-10-CM

## 2013-08-28 LAB — GLUCOSE, CAPILLARY
Glucose-Capillary: 125 mg/dL — ABNORMAL HIGH (ref 70–99)
Glucose-Capillary: 98 mg/dL (ref 70–99)
Glucose-Capillary: 102 mg/dL — ABNORMAL HIGH (ref 70–99)
Glucose-Capillary: 130 mg/dL — ABNORMAL HIGH (ref 70–99)

## 2013-08-28 NOTE — Progress Notes (Signed)
Patient BP: 162/74 and HR: 52. MD notified. MD wants PRN Clonidine to be given and ordered a cardiology consult due to patients bradycardia. Patient received clonidine @0614 . Patient is resting at this time. Will continue to monitor patient.

## 2013-08-28 NOTE — Consult Note (Signed)
CARDIOLOGY CONSULT NOTE   Patient ID: Paul Perez MRN: [id number] DOB/AGE: 1928/01/14 80 y.o.  Admit Date: 08/20/2013 Referring Physician: Marjean Donna MD Primary Physician: Lanette Hampshire, MD Consulting Cardiologist: Rozann Lesches MD Reason for Consultation: Bradycardia  Clinical Summary [name] is an 80 y.o.male admitted with altered mental status, acute on chronic encephalopathy, UTI, and rhabdomyolysis, CK 6,248.Marland Kitchen He was found to have episodes of bradycardia overnight by telemetry and we are consulted. He has a history of hypertension, unspecified "heart problem"  Not on any standing medications for heart rate control.    The patient is pleasantly confused, there are no family members in the room, I have called family member at home [facility]) but there is no answer. Review of prior records demonstrates the patient has a history of GI bleed secondary to duodenal ulcer, H. pylori infection, diabetes type 2, along with hypertension.     On evaluation, the patient denies any pain or shortness of breath. He however thinks he is in [place] at [employer]. There is a Actuary with him appointed by [facility].  No Known Allergies  Medications Scheduled Medications: . aspirin  300 mg Rectal Daily   Or  . aspirin  325 mg Oral Daily  . ciprofloxacin  500 mg Oral BID  . enoxaparin (LOVENOX) injection  40 mg Subcutaneous Q24H  . insulin aspart  0-15 Units Subcutaneous TID WC  . insulin aspart  0-5 Units Subcutaneous QHS    Infusions: . dextrose 5 % and 0.45% NaCl 100 mL/hr at 08/28/13 0620    PRN Medications: cloNIDine, LORazepam, senna-docusate   Past Medical History  Diagnosis Date  . Hypertension   . Heart problem   . Poor historian   . GIB (gastrointestinal bleeding)   . Gastritis     Past Surgical History  Procedure Laterality Date  . Inguinal hernia repair  09/2010    Bilateral    FH: Unavailable. No history of CAD on review  of available records.  Social History [name] reports that he has never smoked. He does not have any smokeless tobacco history on file. [name] reports that he does not drink alcohol.  Review of Systems Otherwise reviewed and negative except as outlined.  Physical Examination Blood pressure 123/69, pulse 50, temperature 97.3 F (36.3 C), temperature source Oral, resp. rate 14, height 5\' 9"  (1.753 m), weight 177 lb 12.8 oz (80.65 kg), SpO2 98.00%.  Intake/Output Summary (Last 24 hours) at 08/28/13 1258 Last data filed at 08/28/13 0900  Gross per 24 hour  Intake   3935 ml  Output    550 ml  Net   3385 ml    Telemetry: Sinus bradycardia with frequent PAC's and LVH.  GEN: Confused, responsive. HEENT: Conjunctiva and lids normal, oropharynx clear with moist mucosa. Neck: Supple, no elevated JVP or carotid bruits, no thyromegaly. Lungs: Clear to auscultation, nonlabored breathing at rest. Cardiac: Regular rate and rhythm, no S3 or significant systolic murmur, no pericardial rub. Abdomen: Soft, nontender, no hepatomegaly, bowel sounds present, no guarding or rebound. Extremities: No pitting edema, distal pulses 2+. Skin: Warm and dry. Musculoskeletal: No kyphosis. Wearing hand mitts. Neuropsychiatric: Alert and oriented x3, affect grossly appropriate.   Prior Cardiac Testing/Procedures 1. Echocardiogram: 08/21/2013 Study data: Technically adequate study. - Left ventricle: The cavity size was normal. Wall thickness was increased in a pattern of mild LVH. Systolic function was vigorous. The estimated ejection fraction was in the range of 65% to 70%.  Doppler parameters are consistent with abnormal left ventricular relaxation (grade 1 diastolic dysfunction). - Aortic valve: Valve area: 3.45cm^2(VTI). Valve area: 2.78cm^2 (Vmax). - Left atrium: The atrium was mildly dilated.   Lab Results  Basic Metabolic Panel: ([id number]) sodium 142 potassium 4.4 chloride 100 CO2 26  BUN 24 creatinine 1.44 calcium 10.0 phosphatase 86 albumin 4.0 AST 94 ALT 23 total protein 7.7 Hgb A1C 6.0  CBC: (08/20/2013) hemoglobin 14.4 hematocrit 43.0 white blood cells 9.0 ;PLTs 201  TSH": 2.660  Radiology: CXR 08/20/2013 EXAM: CHEST 2 VIEW FINDINGS: The lungs are clear. Heart size is normal. No pneumothorax or pleural effusion. Thoracic spondylosis noted. IMPRESSION: No acute disease.  CT Head W/O Contrast 08/20/2013 FINDINGS: No mass lesion. No midline shift. No acute hemorrhage or hematoma. No extra-axial fluid collections. No evidence of acute infarction. There is slight diffuse cerebral cortical atrophy with secondary slight prominence of the ventricles. No osseous abnormality. IMPRESSION: Slight atrophy. Otherwise normal exam.   ECG: Sinus bradycardia with probable blocked PAC's, LVH, rate 57 BPM.   Impression and Recommendations  1. Bradycardia: Also having frequent PACs, with slow as 34 beats per minute noted on review of telemetry. He is asymptomatic with this as far as we can tell, but he is not making any complaints and is confused state. Is not on any beta blockers at home, and says that he is on amlodipine 5 mg daily. This has not been restarted during hospitalization. His TSH is within normal range. Review of past medical records states he has a "heart problem" but there is no documentation available for specifics. Family members are not available by phone or in the room to speak with. Echocardiogram has been completed revealing no evidence of LV systolic dysfunction or diastolic dysfunction. We will watch and wait in this setting. There is no significant pauses noted. Due to his confusion and debilitated state we will need to talk with family members concerning any aggressive cardiac workup or intervention, although none indicated at this time.  2. Hypertension: Blood pressure is controlled currently, without antihypertensive therapy during this hospitalization.  He is being provided IV fluid hydration. Agree with holding off on antihypertensives at this time.  3. AMS: He is being followed by Dr. Merlene Laughter with neurology. CT scan offers no evidence of acute CVA. He states that acute encephalopathy is likely multifactorial from toxic metabolic causes. The patient remains pleasantly confused, but cooperative.  4. Diabetes: Currently controlled with insulin coverage. Hgb A1C was slightly elevated.   Signed: Phill Myron. Purcell Nails NP [facility] 08/28/2013, 12:58 PM Co-Sign MD   Attending note:  Patient seen and examined. Reviewed available records which are limited, and modified above note by Ms. Lawrence NP. We are consulted due to recently documented bradycardia which was asymptomatic. Telemetry reviewed, one saved episode showed heart rate of 38 with PACs, earlier in the morning. Since then heart rate has been faster. He does have frequent PACs and blocked PACs, is not on any rate lowering medications. No obvious syncope. Recent echocardiogram showed normal LVEF in the range of 65-70%. TSH normal. Blood pressure has been stable, recently hypertensive, but no low blood pressures. At this point no further cardiac testing is indicated. Would recommend observation for now.  Satira Sark, M.D., F.A.C.C.

## 2013-08-28 NOTE — Progress Notes (Signed)
Subjective: The patient still has mental confusion. He has known evidence of recent dehydration borderline UTI and rhabdomyolysis. He continues to have elevated blood pressure. And had evidence of bradycardia through the night with heart rates in 30-40 range.  Objective: Vital signs in last 24 hours: Temp:  [97.3 F (36.3 C)-98.4 F (36.9 C)] 97.3 F (36.3 C) (04/15 0349) Pulse Rate:  [52-83] 52 (04/15 0614) Resp:  [12-20] 12 (04/15 0349) BP: (150-197)/(69-94) 162/74 mmHg (04/15 0614) SpO2:  [97 %-100 %] 98 % (04/15 0349) Weight change:  Last BM Date:  (Pt unable to tell when last BM was)  Intake/Output from previous day: 04/14 0701 - 04/15 0700 In: 2810 [P.O.:440; I.V.:2370] Out: 1100 [Urine:1100] Intake/Output this shift: Total I/O In: -  Out: 100 [Urine:100]  Physical Exam: H. EENT negative  Lungs clear to P&A  Heart regular rhythm no murmurs  Abdomen no palpable organs or masses  Extremities free of edema  No results found for this basename: WBC, HGB, HCT, PLT,  in the last 72 hours BMET No results found for this basename: NA, K, CL, CO2, GLUCOSE, BUN, CREATININE, CALCIUM,  in the last 72 hours  Studies/Results: No results found.  Medications:  . aspirin  300 mg Rectal Daily   Or  . aspirin  325 mg Oral Daily  . ciprofloxacin  500 mg Oral BID  . enoxaparin (LOVENOX) injection  40 mg Subcutaneous Q24H  . insulin aspart  0-15 Units Subcutaneous TID WC  . insulin aspart  0-5 Units Subcutaneous QHS    . dextrose 5 % and 0.45% NaCl 100 mL/hr at 08/28/13 0620     Assessment/Plan: 1. Acute and chronic encephalopathy  2. Diabetes mellitus-plan to discharge patient home oral medication i.e. metformin, Januvia  3. Essential hypertension-will continue clonidine twice a day  4. Bradycardia intermittent-plan cardiology consult  The patient will be eventually sent home with home health care.   LOS: 8 days   Jerzie Bieri G Arrin Ishler 08/28/2013, 6:39 AM

## 2013-08-28 NOTE — Clinical Social Work Note (Signed)
Pt's son and daughter in room requested to speak with CSW. State they were told yesterday to be here this morning and that CSW would discuss SNF and get him a bed as was recommended. CSW was unaware of this request. Family upset about confusion. Offered to read recent notes and call MD to see if something had changed from recommendation for home with home health. Family had said that they would provide 24/7 supervision for pt at home due to confusion. MD note this morning states return home with home health. Per physician, pt cannot be home alone, but agreeable as long as family is there and home health is arranged. Notified family who state this has been their plan all along. Discussed possible ALF placement and they are not interested. They went to [employer] yesterday and will do application for CAP aid as they said pt has Medicaid. CSW apologized for confusion. Family aware that physician has requested cardiology consult and d/c will be pending these results.  Benay Pike, [place]

## 2013-08-29 LAB — GLUCOSE, CAPILLARY
GLUCOSE-CAPILLARY: 109 mg/dL — AB (ref 70–99)
Glucose-Capillary: 140 mg/dL — ABNORMAL HIGH (ref 70–99)

## 2013-08-29 MED ORDER — DEXTROSE 50 % IV SOLN
INTRAVENOUS | Status: AC
  Filled 2013-08-29: qty 50

## 2013-08-29 NOTE — Discharge Summary (Signed)
Physician Discharge Summary  Paul Perez [id number] DOB: 07/10/1927 DOA: 08/20/2013  PCP: Lanette Hampshire, MD  Admit date: 08/20/2013 Discharge date: 08/29/2013   Follow-up Information   Follow up with [facility].   Contact information:   [address] [place] [address] [phone]       Discharge Diagnoses:  1. Impaired mentation acute and chronic encephalopathy 2. Essential hypertension 3. Rhabdomyolysis 4. Urinary tract infection 5. Diabetes mellitus type 2 6. Sinus bradycardia 7.   Discharge Condition: Stable condition  Disposition the patient will be sent home with family members  Diet recommendation: 2000-calorie ADA diet  Filed Weights   08/20/13 2306 08/29/13 0621  Weight: 80.65 kg (177 lb 12.8 oz) 80.287 kg (177 lb)    History of present illness:  The patient was brought to the emergency room by family members. He was found lying outside of his truck in the yard confused . He was evaluated in the emergency department CT of the head was performed and it did not show evidence of stroke. He did have elevated CPK 6246. The CT of his head did show evidence of slight atrophy chest x-ray was negative. The patient was started on intravenous fluids and subsequently admitted.  Hospital course  The patient had impaired mentation throughout the entire hospitalization. He required hospital personnel to be at bedside for the  Entire period of hospitalization. He was thought to have urinary tract infection and was treated with IV Cipro. He also was given sliding scale NovoLog insulin. The patient times had to be in restraints. And was given IV Ativan. He was seen by neurology and cardiology. MR a of brain was performed which did not show evidence of infarction. Neurology felt that he had encephalopathy of undetermined cause. It was felt that he needed to be placed in nursing facility for further care but social workers were unable to arrange  this primarily because of finances and family. However it was decided that he should be sent home with care of family and with home health care. He was stable at the time of discharge.    Discharge Instructions Family was advised that home health would be available to help manage his medications and to periodically check blood sugars  he should continue the following medications listed   Medication List    STOP taking these medications       insulin glargine 100 UNIT/ML injection  Commonly known as:  LANTUS     insulin lispro 100 UNIT/ML injection  Commonly known as:  HUMALOG      TAKE these medications       amLODipine 5 MG tablet  Commonly known as:  NORVASC  Take 5 mg by mouth daily.     ciprofloxacin 500 MG tablet  Commonly known as:  CIPRO  Take 1 tablet (500 mg total) by mouth 2 (two) times daily. One po bid x 7 days     ciprofloxacin 500 MG tablet  Commonly known as:  CIPRO  Take 1 tablet (500 mg total) by mouth 2 (two) times daily.     lisinopril 10 MG tablet  Commonly known as:  PRINIVIL,ZESTRIL  Take 10 mg by mouth daily.     meclizine 25 MG tablet  Commonly known as:  ANTIVERT  Take 25 mg by mouth 3 (three) times daily as needed for dizziness.     simvastatin 40 MG tablet  Commonly known as:  ZOCOR  Take 40 mg by mouth daily.  triamterene-hydrochlorothiazide 37.5-25 MG per tablet  Commonly known as:  MAXZIDE-25  Take 1 tablet by mouth daily.     zolpidem 10 MG tablet  Commonly known as:  AMBIEN  Take 10 mg by mouth at bedtime as needed for sleep.       No Known Allergies  The results of significant diagnostics from this hospitalization (including imaging, microbiology, ancillary and laboratory) are listed below for reference.    Significant Diagnostic Studies: Dg Chest 2 View  08/20/2013   CLINICAL DATA:  Altered mental status.  Weakness.  EXAM: CHEST  2 VIEW  COMPARISON:  PA and lateral chest 09/10/2012.  FINDINGS: The lungs are clear. Heart  size is normal. No pneumothorax or pleural effusion. Thoracic spondylosis noted.  IMPRESSION: No acute disease.   Electronically Signed   By: Inge Rise M.D.   On: 08/20/2013 18:51   Ct Head Wo Contrast  08/20/2013   CLINICAL DATA:  Altered mental status.  EXAM: CT HEAD WITHOUT CONTRAST  TECHNIQUE: Contiguous axial images were obtained from the base of the skull through the vertex without intravenous contrast.  COMPARISON:  None.  FINDINGS: No mass lesion. No midline shift. No acute hemorrhage or hematoma. No extra-axial fluid collections. No evidence of acute infarction. There is slight diffuse cerebral cortical atrophy with secondary slight prominence of the ventricles.  No osseous abnormality.  IMPRESSION: Slight atrophy.  Otherwise normal exam.   Electronically Signed   By: Rozetta Nunnery M.D.   On: 08/20/2013 19:04   Mr [name] Head Wo Contrast  08/21/2013   CLINICAL DATA:  Stroke  EXAM: MRI HEAD WITHOUT CONTRAST  MRA HEAD WITHOUT CONTRAST  TECHNIQUE: Multiplanar, multiecho pulse sequences of the brain and surrounding structures were obtained without intravenous contrast. Angiographic images of the head were obtained using MRA technique without contrast.  COMPARISON:  CT head 08/20/2013  FINDINGS: MRI HEAD FINDINGS  Mild atrophy. Mild chronic microvascular ischemic changes in the white matter.  Negative for acute infarct. Negative for intracranial hemorrhage. No mass edema or midline shift.  Mild mucosal thickening in the paranasal sinuses.  MRA HEAD FINDINGS  Both vertebral arteries are patent to the basilar without significant stenosis. Right PICA patent. Left PICA not visualized. Basilar widely patent. Posterior cerebral arteries are patent bilaterally. Superior cerebellar arteries are small and not well seen.  Internal carotid artery is patent bilaterally. Decreased signal in the A1 segment bilaterally is felt to be due to artifact. There is decreased signal in the middle cerebral artery branches  bilaterally also felt to be artifactual due to tortuosity. No definite intracranial stenosis or aneurysm.  IMPRESSION: Atrophy and mild chronic microvascular ischemic change. No acute infarct.  MRA head reveals decreased signal in the anterior middle cerebral arteries, felt to be artifact related to tortuosity. No definite intracranial stenosis or occlusion.   Electronically Signed   By: Franchot Gallo M.D.   On: 08/21/2013 12:23   Mr Brain Wo Contrast  08/21/2013   CLINICAL DATA:  Stroke  EXAM: MRI HEAD WITHOUT CONTRAST  MRA HEAD WITHOUT CONTRAST  TECHNIQUE: Multiplanar, multiecho pulse sequences of the brain and surrounding structures were obtained without intravenous contrast. Angiographic images of the head were obtained using MRA technique without contrast.  COMPARISON:  CT head 08/20/2013  FINDINGS: MRI HEAD FINDINGS  Mild atrophy. Mild chronic microvascular ischemic changes in the white matter.  Negative for acute infarct. Negative for intracranial hemorrhage. No mass edema or midline shift.  Mild mucosal thickening in the paranasal sinuses.  MRA HEAD FINDINGS  Both vertebral arteries are patent to the basilar without significant stenosis. Right PICA patent. Left PICA not visualized. Basilar widely patent. Posterior cerebral arteries are patent bilaterally. Superior cerebellar arteries are small and not well seen.  Internal carotid artery is patent bilaterally. Decreased signal in the A1 segment bilaterally is felt to be due to artifact. There is decreased signal in the middle cerebral artery branches bilaterally also felt to be artifactual due to tortuosity. No definite intracranial stenosis or aneurysm.  IMPRESSION: Atrophy and mild chronic microvascular ischemic change. No acute infarct.  MRA head reveals decreased signal in the anterior middle cerebral arteries, felt to be artifact related to tortuosity. No definite intracranial stenosis or occlusion.   Electronically Signed   By: Franchot Gallo M.D.    On: 08/21/2013 12:23    Microbiology: Recent Results (from the past 240 hour(s))  URINE CULTURE     Status: None   Collection Time    08/21/13  8:21 AM      Result Value Ref Range Status   Specimen Description URINE, CLEAN CATCH   Final   Special Requests NONE   Final   Culture  Setup Time     Final   Value: 08/21/2013 20:15     Performed at [place] [facility]     Final   Value: 5,000 COLONIES/ML     Performed at [facility]   Culture     Final   Value: INSIGNIFICANT GROWTH     Performed at [facility]   Report Status 08/23/2013 FINAL   Final     Labs: Basic Metabolic Panel: No results found for this basename: NA, K, CL, CO2, GLUCOSE, BUN, CREATININE, CALCIUM, MG, PHOS,  in the last 168 hours Liver Function Tests: No results found for this basename: AST, ALT, ALKPHOS, BILITOT, PROT, ALBUMIN,  in the last 168 hours No results found for this basename: LIPASE, AMYLASE,  in the last 168 hours No results found for this basename: AMMONIA,  in the last 168 hours CBC: No results found for this basename: WBC, NEUTROABS, HGB, HCT, MCV, PLT,  in the last 168 hours Cardiac Enzymes: No results found for this basename: CKTOTAL, CKMB, CKMBINDEX, TROPONINI,  in the last 168 hours BNP: BNP (last 3 results) No results found for this basename: PROBNP,  in the last 8760 hours CBG:  Recent Labs Lab 08/28/13 1155 08/28/13 1638 08/28/13 2257 08/29/13 0918 08/29/13 1156  GLUCAP 98 102* 130* 140* 109*    Principal Problem:   Acute encephalopathy Active Problems:   Hypertension   AKI (acute kidney injury)   Rhabdomyolysis   UTI (lower urinary tract infection)   Bradycardia   PAC (premature atrial contraction)   Time coordinating discharge: 45 minutes Signed:  Marjean Donna, MD 08/29/2013, 1:40 PM

## 2013-08-29 NOTE — Progress Notes (Signed)
PT Cancellation Note  Patient Details Name: RENARD MOHAMAD MRN: [id number] DOB: Jul 13, 1927   Cancelled Treatment:    Reason Eval/Treat Not Completed: Other (comment) Attempted to engage pt in bed exercises but pt would only complete 1-2 exercises before stopping and saying "I'm not going to do it." Attempted for 10' to have pt sit up at edge of bed and attempt to stand but pt adamantly refuses. Assisted nursing with rolling to change bed. Rolling required max assist as pt is unable to follow simple directions.  Rachelle Hora, PTA  08/29/2013, 9:24 AM

## 2013-08-29 NOTE — Progress Notes (Signed)
IV removed, site WNL.  Pt given d/c instructions and new prescriptions.  Discussed all home medications (when, how, and why to take), patient verbalizes understanding. Discussed home care with patient, teachback completed. F/U appointment in place with Dr Everette Rank, pt states they will keep appointment. Pt is stable at this time. Pt taken to main entrance in wheelchair by staff member. HH has been arranged by case management.

## 2013-08-29 NOTE — Clinical Social Work Note (Signed)
CSW received call from physician questioning pt's return home. He is aware that children plan to stay with him around the clock and son is applying to become CAP aid for him. Pt will have full home health services. Physician concerned about mitts because pt is trying to pull out IV. Pt will not d/c with IV. Notified physician that ALF was discussed with family yesterday and they are not interested. PT attempted to work with pt this morning and he is not following commands. Verified with therapist that SNF is not recommended at this time due to this. It was reported that pt was trying to get up on his own earlier. Physician plans to d/c pt this afternoon.   Benay Pike, [place]

## 2013-08-29 NOTE — Progress Notes (Signed)
Subjective: The patient to the continues to remain mentally confused. He thinks he is in the state of [place]. His been treated for dehydration urine tract infection rhabdomyolysis. His been seen by cardiology because of bradycardia and hypertension  Objective: Vital signs in last 24 hours: Temp:  [98 F (36.7 C)-98.4 F (36.9 C)] 98.3 F (36.8 C) (04/16 0621) Pulse Rate:  [50-86] 86 (04/16 0621) Resp:  [14-18] 18 (04/16 0621) BP: (123-211)/(63-80) 179/64 mmHg (04/16 0621) SpO2:  [100 %] 100 % (04/16 0621) Weight:  [80.287 kg (177 lb)] 80.287 kg (177 lb) (04/16 [id number]) Weight change:  Last BM Date:  (Pt unable to tell when last BM was)  Intake/Output from previous day: 04/15 0701 - 04/16 0700 In: 1328.3 [P.O.:320; I.V.:1008.3] Out: 1975 [id number] Intake/Output this shift: Total I/O In: 100 [P.O.:100] Out: 1975 [Urine:1975]  Physical Exam: H. EENT negative  Lungs clear to P&A  Heart regular rhythm no murmurs  Abdomen the palpable organs or masses  Extremities free of edema  No results found for this basename: WBC, HGB, HCT, PLT,  in the last 72 hours BMET No results found for this basename: NA, K, CL, CO2, GLUCOSE, BUN, CREATININE, CALCIUM,  in the last 72 hours  Studies/Results: No results found.  Medications:  . aspirin  300 mg Rectal Daily   Or  . aspirin  325 mg Oral Daily  . ciprofloxacin  500 mg Oral BID  . enoxaparin (LOVENOX) injection  40 mg Subcutaneous Q24H  . insulin aspart  0-15 Units Subcutaneous TID WC  . insulin aspart  0-5 Units Subcutaneous QHS    . dextrose 5 % and 0.45% NaCl 100 mL/hr at 08/29/13 0213     Assessment/Plan: 1. Encephalopathy, rhabdomyolysis, recent urine tract infection  2 essential hypertension with episodes of bradycardia. Diabetes mellitus in fair control  3. Plan to discuss patient's condition with social services. Patient has had to have a sitter he is constantly confused and hospital personnel feels it is unsafe  for him to be discharged home but possibly placed in nursing facility.   LOS: 9 days   Emmanuel Ercole G Madysin Crisp 08/29/2013, 6:32 AM

## 2013-09-02 NOTE — Progress Notes (Signed)
UR chart review completed.  

## 2014-07-24 ENCOUNTER — Other Ambulatory Visit (HOSPITAL_COMMUNITY): Payer: Self-pay | Admitting: Family Medicine

## 2014-07-24 ENCOUNTER — Ambulatory Visit (HOSPITAL_COMMUNITY)
Admission: RE | Admit: 2014-07-24 | Discharge: 2014-07-24 | Disposition: A | Discharge status: Home / Self Care | Payer: Medicare Other | Source: Ambulatory Visit | Attending: Family Medicine | Admitting: Family Medicine

## 2014-07-24 DIAGNOSIS — R0600 Dyspnea, unspecified: Secondary | ICD-10-CM

## 2014-07-24 DIAGNOSIS — R0602 Shortness of breath: Secondary | ICD-10-CM | POA: Insufficient documentation

## 2015-07-03 ENCOUNTER — Other Ambulatory Visit (HOSPITAL_COMMUNITY): Payer: Self-pay | Admitting: Internal Medicine

## 2015-07-03 DIAGNOSIS — R413 Other amnesia: Secondary | ICD-10-CM

## 2015-11-05 ENCOUNTER — Ambulatory Visit: Payer: Self-pay | Admitting: Podiatry

## 2016-12-27 ENCOUNTER — Emergency Department (HOSPITAL_COMMUNITY)
Admission: EM | Admit: 2016-12-27 | Discharge: 2016-12-27 | Disposition: A | Discharge status: Home / Self Care | Payer: Medicare Other | Attending: Emergency Medicine | Admitting: Emergency Medicine

## 2016-12-27 ENCOUNTER — Encounter (HOSPITAL_COMMUNITY): Payer: Self-pay

## 2016-12-27 DIAGNOSIS — N19 Unspecified kidney failure: Secondary | ICD-10-CM | POA: Diagnosis not present

## 2016-12-27 DIAGNOSIS — D649 Anemia, unspecified: Secondary | ICD-10-CM | POA: Diagnosis not present

## 2016-12-27 DIAGNOSIS — Z7984 Long term (current) use of oral hypoglycemic drugs: Secondary | ICD-10-CM | POA: Insufficient documentation

## 2016-12-27 DIAGNOSIS — I10 Essential (primary) hypertension: Secondary | ICD-10-CM | POA: Diagnosis not present

## 2016-12-27 DIAGNOSIS — E119 Type 2 diabetes mellitus without complications: Secondary | ICD-10-CM | POA: Diagnosis not present

## 2016-12-27 DIAGNOSIS — Z79899 Other long term (current) drug therapy: Secondary | ICD-10-CM | POA: Insufficient documentation

## 2016-12-27 DIAGNOSIS — R001 Bradycardia, unspecified: Secondary | ICD-10-CM | POA: Diagnosis present

## 2016-12-27 HISTORY — DX: Type 2 diabetes mellitus without complications: E11.9

## 2016-12-27 LAB — COMPREHENSIVE METABOLIC PANEL
ALK PHOS: 48 U/L (ref 38–126)
ALT: 10 U/L — AB (ref 17–63)
AST: 14 U/L — AB (ref 15–41)
Albumin: 3.9 g/dL (ref 3.5–5.0)
Anion gap: 6 (ref 5–15)
BUN: 57 mg/dL — AB (ref 6–20)
CHLORIDE: 107 mmol/L (ref 101–111)
CO2: 23 mmol/L (ref 22–32)
CREATININE: 2.7 mg/dL — AB (ref 0.61–1.24)
Calcium: 8.8 mg/dL — ABNORMAL LOW (ref 8.9–10.3)
GFR calc non Af Amer: 20 mL/min — ABNORMAL LOW (ref >60)
GFR, EST AFRICAN AMERICAN: 23 mL/min — AB (ref >60)
GLUCOSE: 127 mg/dL — AB (ref 65–99)
Potassium: 4.9 mmol/L (ref 3.5–5.1)
SODIUM: 136 mmol/L (ref 135–145)
Total Bilirubin: 0.5 mg/dL (ref 0.3–1.2)
Total Protein: 6.7 g/dL (ref 6.5–8.1)

## 2016-12-27 LAB — CBC WITH DIFFERENTIAL/PLATELET
BASOS ABS: 0 10*3/uL (ref 0.0–0.1)
Basophils Relative: 0 %
Eosinophils Absolute: 0.3 10*3/uL (ref 0.0–0.7)
Eosinophils Relative: 5 %
HCT: 30.7 % — ABNORMAL LOW (ref 39.0–52.0)
HEMOGLOBIN: 9.9 g/dL — AB (ref 13.0–17.0)
LYMPHS ABS: 1 10*3/uL (ref 0.7–4.0)
Lymphocytes Relative: 18 %
MCH: 27.3 pg (ref 26.0–34.0)
MCHC: 32.2 g/dL (ref 30.0–36.0)
MCV: 84.8 fL (ref 78.0–100.0)
Monocytes Absolute: 0.4 10*3/uL (ref 0.1–1.0)
Monocytes Relative: 7 %
Neutro Abs: 4 10*3/uL (ref 1.7–7.7)
Neutrophils Relative %: 70 %
Platelets: 147 10*3/uL — ABNORMAL LOW (ref 150–400)
RBC: 3.62 MIL/uL — ABNORMAL LOW (ref 4.22–5.81)
RDW: 14 % (ref 11.5–15.5)
WBC: 5.7 10*3/uL (ref 4.0–10.5)

## 2016-12-27 LAB — POC OCCULT BLOOD, ED: FECAL OCCULT BLD: NEGATIVE

## 2016-12-27 LAB — MAGNESIUM: Magnesium: 3.4 mg/dL — ABNORMAL HIGH (ref 1.7–2.4)

## 2016-12-27 MED ORDER — SULFAMETHOXAZOLE-TRIMETHOPRIM 800-160 MG PO TABS: 1.0000 | ORAL_TABLET | Freq: Two times a day (BID) | ORAL | 0 refills | Status: DC

## 2016-12-27 NOTE — Care Management (Signed)
CM contacted by [facility] rep. Pt active with [facility] services.

## 2016-12-27 NOTE — ED Provider Notes (Signed)
[facility] DEPT Provider Note   CSN: [id number] Arrival date & time: 12/27/16  0935     History   Chief Complaint Chief Complaint  Patient presents with  . Bradycardia    HPI Paul Perez is a 83 y.o. male.  Pt was sent to Ed by home health nurse due to low heart rate.  Pt's heart rate was 37 by RN.  Ens reports heart rate was in the 60 on monitor.  Pt denies any problems.  He reports he feels fine.  Pt denies any chest pain.  No shortness of breath.  Pt denies cough or congestion.    The history is provided by the patient. No language interpreter was used.    Past Medical History:  Diagnosis Date  . Diabetes mellitus without complication ([place])   . Gastritis   . GIB (gastrointestinal bleeding)   . Heart problem   . Hypertension   . Poor historian     Patient Active Problem List   Diagnosis Date Noted  . Bradycardia 08/28/2013  . PAC (premature atrial contraction) 08/28/2013  . Altered mental state 08/20/2013  . Acute encephalopathy 08/20/2013  . Hypertension 08/20/2013  . AKI (acute kidney injury) ([place]) 08/20/2013  . Rhabdomyolysis 08/20/2013  . UTI (lower urinary tract infection) 08/20/2013    Past Surgical History:  Procedure Laterality Date  . INGUINAL HERNIA REPAIR  09/2010   Bilateral       Home Medications    Prior to Admission medications   Medication Sig Start Date End Date Taking? Authorizing Provider  amLODipine (NORVASC) 5 MG tablet Take 5 mg by mouth daily.   Yes [provider]  lisinopril (PRINIVIL,ZESTRIL) 30 MG tablet Take 30 mg by mouth daily.    Yes [provider]  meclizine (ANTIVERT) 25 MG tablet Take 25 mg by mouth 3 (three) times daily as needed for dizziness.   Yes [provider]  metFORMIN (GLUCOPHAGE) 500 MG tablet Take 1 tablet by mouth 2 (two) times daily. 11/25/16  Yes [provider]  pioglitazone (ACTOS) 15 MG tablet Take 1 tablet by mouth daily. 12/01/16  Yes [provider]  simvastatin (ZOCOR) 40 MG tablet Take 40 mg by mouth daily.   Yes [provider]  triamterene-hydrochlorothiazide (MAXZIDE-25) 37.5-25 MG per tablet Take 1 tablet by mouth daily.   Yes [provider]  zolpidem (AMBIEN) 10 MG tablet Take 10 mg by mouth at bedtime as needed for sleep.   Yes [provider]  ciprofloxacin (CIPRO) 500 MG tablet Take 1 tablet (500 mg total) by mouth 2 (two) times daily. One po bid x 7 days 08/20/13   Milton Ferguson, MD  ciprofloxacin (CIPRO) 500 MG tablet Take 1 tablet (500 mg total) by mouth 2 (two) times daily. 08/26/13   Marjean Donna, MD    Family History No family history on file.  Social History Social History  Substance Use Topics  . Smoking status: Never Smoker  . Smokeless tobacco: Never Used  . Alcohol use No     Allergies   Patient has no known allergies.   Review of Systems Review of Systems  All other systems reviewed and are negative.    Physical Exam Updated Vital Signs BP (!) 160/58   Pulse (!) 38   Temp 98 F (36.7 C) (Rectal)   Resp 12   Ht 5\' 7"  (1.702 m)   Wt 90.7 kg (200 lb)   SpO2 98%   BMI 31.32 kg/m  Physical Exam  Constitutional: He appears well-developed and well-nourished.  HENT:  Head: Normocephalic and atraumatic.  Eyes: Conjunctivae are normal.  Neck: Neck supple.  Cardiovascular: Normal rate and regular rhythm.   No murmur heard. Pulmonary/Chest: Effort normal and breath sounds normal. No respiratory distress.  Abdominal: Soft. There is no tenderness.  Musculoskeletal: He exhibits no edema.  Neurological: He is alert.  Skin: Skin is warm and dry.  Psychiatric: He has a normal mood and affect.  Nursing note and vitals reviewed.    ED Treatments / Results  Labs (all labs ordered are listed, but only abnormal results are displayed) Labs Reviewed  CBC WITH DIFFERENTIAL/PLATELET - Abnormal; Notable for the following:       Result Value   RBC 3.62 (*)    Hemoglobin  9.9 (*)    HCT 30.7 (*)    Platelets 147 (*)    All other components within normal limits  COMPREHENSIVE METABOLIC PANEL - Abnormal; Notable for the following:    Glucose, Bld 127 (*)    BUN 57 (*)    Creatinine, Ser 2.70 (*)    Calcium 8.8 (*)    AST 14 (*)    ALT 10 (*)    GFR calc non Af Amer 20 (*)    GFR calc Af Amer 23 (*)    All other components within normal limits  MAGNESIUM - Abnormal; Notable for the following:    Magnesium 3.4 (*)    All other components within normal limits  POC OCCULT BLOOD, ED    EKG  EKG Interpretation  Date/Time:  Tuesday December 27 2016 09:38:22 EDT Ventricular Rate:  67 PR Interval:    QRS Duration: 98 QT Interval:  408 QTC Calculation: 431 R Axis:   48 Text Interpretation:  Sinus rhythm Atrial premature complexes Probable anteroseptal infarct, old Confirmed by Elnora Morrison [id number]) on 12/27/2016 9:42:51 AM       Radiology No results found.  Procedures Procedures (including critical care time)  Medications Ordered in ED Medications - No data to display   Initial Impression / Assessment and Plan / ED Course  I have reviewed the triage vital signs and the nursing notes.  Pertinent labs & imaging results that were available during my care of the patient were reviewed by me and considered in my medical decision making (see chart for details).     I spoke with Dr. Julianne Rice office.  Pt is followed there regularly.   Labs from office reviewed.  Pt has a history of renal disease and anemia.   They will follow up on today's concerns.   Final Clinical Impressions(s) / ED Diagnoses   Final diagnoses:  Anemia, unspecified type  Renal failure, unspecified chronicity    New Prescriptions Current Discharge Medication List       Sidney Ace 12/27/16 1307    Elnora Morrison, MD 12/27/16 1500

## 2016-12-27 NOTE — ED Triage Notes (Signed)
EMS reports pt's home health nurse came out to do some diabetic education and when she checked his heart rate it was 34.  Pt was asymptomatic.  EMS arrived and HR on monitor was 67-81 enroute.  Pt denies any pain, weakness, dizziness, or sob.

## 2016-12-27 NOTE — Discharge Instructions (Addendum)
See Dr Kim for follow up

## 2017-08-25 ENCOUNTER — Emergency Department (HOSPITAL_COMMUNITY): Payer: Medicare Other

## 2017-08-25 ENCOUNTER — Inpatient Hospital Stay (HOSPITAL_COMMUNITY)
Admission: EM | Admit: 2017-08-25 | Discharge: 2017-08-30 | DRG: 558 | Disposition: A | Discharge status: Skilled Nursing Facility | Payer: Medicare Other | Attending: Internal Medicine | Admitting: Internal Medicine

## 2017-08-25 ENCOUNTER — Encounter (HOSPITAL_COMMUNITY): Payer: Self-pay | Admitting: Emergency Medicine

## 2017-08-25 ENCOUNTER — Other Ambulatory Visit: Payer: Self-pay

## 2017-08-25 DIAGNOSIS — N183 Chronic kidney disease, stage 3 (moderate): Secondary | ICD-10-CM | POA: Diagnosis present

## 2017-08-25 DIAGNOSIS — E870 Hyperosmolality and hypernatremia: Secondary | ICD-10-CM | POA: Diagnosis present

## 2017-08-25 DIAGNOSIS — F0391 Unspecified dementia with behavioral disturbance: Secondary | ICD-10-CM | POA: Diagnosis present

## 2017-08-25 DIAGNOSIS — T699XXA Effect of reduced temperature, unspecified, initial encounter: Secondary | ICD-10-CM | POA: Diagnosis present

## 2017-08-25 DIAGNOSIS — R778 Other specified abnormalities of plasma proteins: Secondary | ICD-10-CM

## 2017-08-25 DIAGNOSIS — I10 Essential (primary) hypertension: Secondary | ICD-10-CM | POA: Diagnosis present

## 2017-08-25 DIAGNOSIS — E872 Acidosis, unspecified: Secondary | ICD-10-CM | POA: Diagnosis present

## 2017-08-25 DIAGNOSIS — E118 Type 2 diabetes mellitus with unspecified complications: Secondary | ICD-10-CM | POA: Diagnosis not present

## 2017-08-25 DIAGNOSIS — R1310 Dysphagia, unspecified: Secondary | ICD-10-CM | POA: Diagnosis present

## 2017-08-25 DIAGNOSIS — IMO0002 Reserved for concepts with insufficient information to code with codable children: Secondary | ICD-10-CM | POA: Diagnosis present

## 2017-08-25 DIAGNOSIS — R410 Disorientation, unspecified: Secondary | ICD-10-CM

## 2017-08-25 DIAGNOSIS — X31XXXA Exposure to excessive natural cold, initial encounter: Secondary | ICD-10-CM | POA: Diagnosis not present

## 2017-08-25 DIAGNOSIS — E1122 Type 2 diabetes mellitus with diabetic chronic kidney disease: Secondary | ICD-10-CM | POA: Diagnosis present

## 2017-08-25 DIAGNOSIS — I129 Hypertensive chronic kidney disease with stage 1 through stage 4 chronic kidney disease, or unspecified chronic kidney disease: Secondary | ICD-10-CM | POA: Diagnosis present

## 2017-08-25 DIAGNOSIS — N179 Acute kidney failure, unspecified: Secondary | ICD-10-CM | POA: Diagnosis present

## 2017-08-25 DIAGNOSIS — M6282 Rhabdomyolysis: Secondary | ICD-10-CM | POA: Diagnosis present

## 2017-08-25 DIAGNOSIS — E878 Other disorders of electrolyte and fluid balance, not elsewhere classified: Secondary | ICD-10-CM | POA: Diagnosis not present

## 2017-08-25 DIAGNOSIS — R001 Bradycardia, unspecified: Secondary | ICD-10-CM | POA: Diagnosis present

## 2017-08-25 DIAGNOSIS — G9349 Other encephalopathy: Secondary | ICD-10-CM | POA: Diagnosis present

## 2017-08-25 DIAGNOSIS — G9340 Encephalopathy, unspecified: Secondary | ICD-10-CM | POA: Diagnosis not present

## 2017-08-25 DIAGNOSIS — R451 Restlessness and agitation: Secondary | ICD-10-CM

## 2017-08-25 DIAGNOSIS — R7989 Other specified abnormal findings of blood chemistry: Secondary | ICD-10-CM

## 2017-08-25 DIAGNOSIS — T796XXA Traumatic ischemia of muscle, initial encounter: Secondary | ICD-10-CM | POA: Diagnosis not present

## 2017-08-25 DIAGNOSIS — E871 Hypo-osmolality and hyponatremia: Secondary | ICD-10-CM | POA: Diagnosis not present

## 2017-08-25 DIAGNOSIS — E860 Dehydration: Secondary | ICD-10-CM | POA: Diagnosis present

## 2017-08-25 DIAGNOSIS — R52 Pain, unspecified: Secondary | ICD-10-CM

## 2017-08-25 DIAGNOSIS — E785 Hyperlipidemia, unspecified: Secondary | ICD-10-CM | POA: Diagnosis present

## 2017-08-25 DIAGNOSIS — R748 Abnormal levels of other serum enzymes: Secondary | ICD-10-CM

## 2017-08-25 DIAGNOSIS — I340 Nonrheumatic mitral (valve) insufficiency: Secondary | ICD-10-CM | POA: Diagnosis not present

## 2017-08-25 DIAGNOSIS — Z79899 Other long term (current) drug therapy: Secondary | ICD-10-CM | POA: Diagnosis not present

## 2017-08-25 DIAGNOSIS — R945 Abnormal results of liver function studies: Secondary | ICD-10-CM

## 2017-08-25 DIAGNOSIS — E1165 Type 2 diabetes mellitus with hyperglycemia: Secondary | ICD-10-CM | POA: Diagnosis present

## 2017-08-25 DIAGNOSIS — Z7984 Long term (current) use of oral hypoglycemic drugs: Secondary | ICD-10-CM | POA: Diagnosis not present

## 2017-08-25 DIAGNOSIS — T68XXXA Hypothermia, initial encounter: Secondary | ICD-10-CM | POA: Diagnosis present

## 2017-08-25 HISTORY — DX: Unspecified dementia, unspecified severity, without behavioral disturbance, psychotic disturbance, mood disturbance, and anxiety: F03.90

## 2017-08-25 HISTORY — DX: Bradycardia, unspecified: R00.1

## 2017-08-25 HISTORY — DX: Encephalopathy, unspecified: G93.40

## 2017-08-25 LAB — CBC WITH DIFFERENTIAL/PLATELET
Basophils Absolute: 0 10*3/uL (ref 0.0–0.1)
Basophils Relative: 0 %
EOS PCT: 0 %
Eosinophils Absolute: 0 10*3/uL (ref 0.0–0.7)
HEMATOCRIT: 39.4 % (ref 39.0–52.0)
Hemoglobin: 12.1 g/dL — ABNORMAL LOW (ref 13.0–17.0)
Lymphocytes Relative: 12 %
Lymphs Abs: 1.3 10*3/uL (ref 0.7–4.0)
MCH: 27.1 pg (ref 26.0–34.0)
MCHC: 30.7 g/dL (ref 30.0–36.0)
MCV: 88.3 fL (ref 78.0–100.0)
MONO ABS: 0 10*3/uL — AB (ref 0.1–1.0)
MONOS PCT: 0 %
NEUTROS ABS: 10.2 10*3/uL — AB (ref 1.7–7.7)
Neutrophils Relative %: 88 %
Platelets: 216 10*3/uL (ref 150–400)
RBC: 4.46 MIL/uL (ref 4.22–5.81)
RDW: 14.5 % (ref 11.5–15.5)
WBC: 11.6 10*3/uL — ABNORMAL HIGH (ref 4.0–10.5)

## 2017-08-25 LAB — SALICYLATE LEVEL

## 2017-08-25 LAB — GLUCOSE, CAPILLARY: Glucose-Capillary: 106 mg/dL — ABNORMAL HIGH (ref 65–99)

## 2017-08-25 LAB — LACTIC ACID, PLASMA
LACTIC ACID, VENOUS: 2.2 mmol/L — AB (ref 0.5–1.9)
Lactic Acid, Venous: 2.4 mmol/L (ref 0.5–1.9)
Lactic Acid, Venous: 0.5 mmol/L (ref 0.5–1.9)

## 2017-08-25 LAB — URINALYSIS, ROUTINE W REFLEX MICROSCOPIC
BILIRUBIN URINE: NEGATIVE
Glucose, UA: NEGATIVE mg/dL
Ketones, ur: 5 mg/dL — AB
Leukocytes, UA: NEGATIVE
Nitrite: NEGATIVE
PROTEIN: 100 mg/dL — AB
Specific Gravity, Urine: 1.017 (ref 1.005–1.030)
pH: 5 (ref 5.0–8.0)

## 2017-08-25 LAB — RAPID URINE DRUG SCREEN, HOSP PERFORMED
AMPHETAMINES: NOT DETECTED
Barbiturates: NOT DETECTED
Benzodiazepines: NOT DETECTED
Cocaine: NOT DETECTED
Opiates: NOT DETECTED
TETRAHYDROCANNABINOL: NOT DETECTED

## 2017-08-25 LAB — COMPREHENSIVE METABOLIC PANEL
ALT: 101 U/L — AB (ref 17–63)
ANION GAP: 20 — AB (ref 5–15)
AST: 355 U/L — ABNORMAL HIGH (ref 15–41)
Albumin: 3.8 g/dL (ref 3.5–5.0)
Alkaline Phosphatase: 70 U/L (ref 38–126)
BUN: 89 mg/dL — ABNORMAL HIGH (ref 6–20)
CHLORIDE: 111 mmol/L (ref 101–111)
CO2: 15 mmol/L — ABNORMAL LOW (ref 22–32)
CREATININE: 2.79 mg/dL — AB (ref 0.61–1.24)
Calcium: 9.3 mg/dL (ref 8.9–10.3)
GFR calc non Af Amer: 19 mL/min — ABNORMAL LOW (ref >60)
GFR, EST AFRICAN AMERICAN: 22 mL/min — AB (ref >60)
Glucose, Bld: 178 mg/dL — ABNORMAL HIGH (ref 65–99)
POTASSIUM: 5.6 mmol/L — AB (ref 3.5–5.1)
Sodium: 146 mmol/L — ABNORMAL HIGH (ref 135–145)
Total Bilirubin: 0.9 mg/dL (ref 0.3–1.2)
Total Protein: 7.4 g/dL (ref 6.5–8.1)

## 2017-08-25 LAB — CBG MONITORING, ED: GLUCOSE-CAPILLARY: 143 mg/dL — AB (ref 65–99)

## 2017-08-25 LAB — TROPONIN I
TROPONIN I: 8.03 ng/mL — AB (ref <0.03)
Troponin I: 9.31 ng/mL (ref <0.03)
Troponin I: 6.94 ng/mL (ref <0.03)

## 2017-08-25 LAB — PROTIME-INR
INR: 1.13
Prothrombin Time: 14.4 seconds (ref 11.4–15.2)

## 2017-08-25 LAB — ETHANOL

## 2017-08-25 LAB — HEMOGLOBIN A1C
HEMOGLOBIN A1C: 6.4 % — AB (ref 4.8–5.6)
MEAN PLASMA GLUCOSE: 136.98 mg/dL

## 2017-08-25 LAB — CK: CK TOTAL: 21892 U/L — AB (ref 49–397)

## 2017-08-25 LAB — ACETAMINOPHEN LEVEL

## 2017-08-25 LAB — TSH: TSH: 1.69 u[IU]/mL (ref 0.350–4.500)

## 2017-08-25 MED ORDER — ONDANSETRON HCL 4 MG PO TABS: 4.0000 mg | ORAL_TABLET | Freq: Four times a day (QID) | ORAL | Status: DC | PRN

## 2017-08-25 MED ORDER — SODIUM CHLORIDE 0.9 % IV SOLN
INTRAVENOUS | Status: DC
  Administered 2017-08-25 – 2017-08-26 (×2): via INTRAVENOUS

## 2017-08-25 MED ORDER — INSULIN ASPART 100 UNIT/ML ~~LOC~~ SOLN
0.0000 [IU] | Freq: Three times a day (TID) | SUBCUTANEOUS | Status: DC
  Administered 2017-08-25 – 2017-08-26 (×3): 1 [IU] via SUBCUTANEOUS
  Filled 2017-08-25: qty 1

## 2017-08-25 MED ORDER — SODIUM CHLORIDE 0.9 % IV SOLN
INTRAVENOUS | Status: DC
  Administered 2017-08-25: 12:00:00 via INTRAVENOUS

## 2017-08-25 MED ORDER — LORAZEPAM 2 MG/ML IJ SOLN
1.0000 mg | Freq: Once | INTRAMUSCULAR | Status: AC
  Administered 2017-08-25: 1 mg via INTRAVENOUS
  Filled 2017-08-25: qty 1

## 2017-08-25 MED ORDER — HALOPERIDOL LACTATE 5 MG/ML IJ SOLN
2.0000 mg | Freq: Four times a day (QID) | INTRAMUSCULAR | Status: DC | PRN
  Administered 2017-08-25 – 2017-08-29 (×6): 2 mg via INTRAVENOUS
  Filled 2017-08-25 (×8): qty 1

## 2017-08-25 MED ORDER — ONDANSETRON HCL 4 MG/2ML IJ SOLN: 4.0000 mg | Freq: Four times a day (QID) | INTRAMUSCULAR | Status: DC | PRN

## 2017-08-25 MED ORDER — POLYETHYLENE GLYCOL 3350 17 G PO PACK: 17.0000 g | PACK | Freq: Every day | ORAL | Status: DC | PRN

## 2017-08-25 MED ORDER — SODIUM CHLORIDE 0.9 % IV BOLUS
500.0000 mL | Freq: Once | INTRAVENOUS | Status: AC
  Administered 2017-08-25: 500 mL via INTRAVENOUS

## 2017-08-25 MED ORDER — ACETAMINOPHEN 325 MG PO TABS: 650.0000 mg | ORAL_TABLET | Freq: Four times a day (QID) | ORAL | Status: DC | PRN

## 2017-08-25 MED ORDER — ACETAMINOPHEN 650 MG RE SUPP: 650.0000 mg | Freq: Four times a day (QID) | RECTAL | Status: DC | PRN

## 2017-08-25 MED ORDER — SODIUM CHLORIDE 0.9 % IV BOLUS
1000.0000 mL | Freq: Once | INTRAVENOUS | Status: AC
  Administered 2017-08-25: 1000 mL via INTRAVENOUS

## 2017-08-25 NOTE — ED Notes (Signed)
Pt speaking but unable to discern what is being said

## 2017-08-25 NOTE — ED Notes (Signed)
Dr Tonye Becket in to discuss findings with family

## 2017-08-25 NOTE — ED Notes (Signed)
CK delay d/t serial dilutions. MD notified.

## 2017-08-25 NOTE — ED Notes (Signed)
Dr Tonye Becket informed that pt family member at bedside and would appreciate update

## 2017-08-25 NOTE — ED Notes (Signed)
Patient transported to CT 

## 2017-08-25 NOTE — ED Notes (Signed)
Call to lab- informed of added CK

## 2017-08-25 NOTE — ED Notes (Signed)
Labs drawn from several sites

## 2017-08-25 NOTE — ED Notes (Signed)
Critical value Trop 9.3- Dr Tonye Becket informed  Also informed of pt confusion and pulling out his IVs- the restart, move to room 3

## 2017-08-25 NOTE — ED Notes (Signed)
Pt pulling at lines Pulling off clothing, covers,   Is confused   Unable to redirect  Request for sitter (2nd request)

## 2017-08-25 NOTE — ED Notes (Signed)
Pt is in CT- labs are delayed as is second line Introduced self to pt family and went over plan of care

## 2017-08-25 NOTE — ED Notes (Signed)
Report given to ICU at this time.  

## 2017-08-25 NOTE — ED Triage Notes (Signed)
Pt was found outside at his house who had been laying on the ground all night,  Brother found pt this morning.  Pt shaking and c/o of pain.  94.6 tympanic temp per EMS.

## 2017-08-25 NOTE — ED Notes (Signed)
Call re: CK results Per lab, had to be diluted and is still running

## 2017-08-25 NOTE — ED Provider Notes (Signed)
[facility] EMERGENCY DEPARTMENT Provider Note   CSN: [id number] Arrival date & time: 08/25/17  1009     History   Chief Complaint Chief Complaint  Patient presents with  . Cold Exposure    HPI Paul Perez is a 84 y.o. male.  The history is provided by the EMS personnel and the patient. The history is limited by the condition of the patient (AMS).  Pt was seen at 1025. Per EMS and pt's family: Pt's family last saw pt yesterday. He had been outside mowing the lawn yesterday afternoon. Today, family found the lawnmower in the woods and the pt laying on the ground, presumably all night. EMS found pt "shaking" and "c/o pain." EMS noted "tympanic temp 94.6." Pt is confused. Denies any pain on my exam.  Family states pt has hx of similar episode several years ago.   Past Medical History:  Diagnosis Date  . Dementia   . Diabetes mellitus without complication ([place])   . Encephalopathy   . Gastritis   . GIB (gastrointestinal bleeding)   . Heart problem   . Hypertension   . Poor historian     Patient Active Problem List   Diagnosis Date Noted  . Bradycardia 08/28/2013  . PAC (premature atrial contraction) 08/28/2013  . Altered mental state 08/20/2013  . Acute encephalopathy 08/20/2013  . Hypertension 08/20/2013  . AKI (acute kidney injury) ([place]) 08/20/2013  . Rhabdomyolysis 08/20/2013  . UTI (lower urinary tract infection) 08/20/2013    Past Surgical History:  Procedure Laterality Date  . INGUINAL HERNIA REPAIR  09/2010   Bilateral        Home Medications    Prior to Admission medications   Medication Sig Start Date End Date Taking? Authorizing Provider  amLODipine (NORVASC) 5 MG tablet Take 5 mg by mouth daily.   Yes [provider]  lisinopril (PRINIVIL,ZESTRIL) 30 MG tablet Take 30 mg by mouth daily.    Yes [provider]  meclizine (ANTIVERT) 25 MG tablet Take 25 mg by mouth 3 (three) times daily as needed for dizziness.   Yes [provider]  metFORMIN (GLUCOPHAGE) 500 MG tablet Take 1 tablet by mouth 2 (two) times daily. 11/25/16  Yes [provider]  pioglitazone (ACTOS) 15 MG tablet Take 1 tablet by mouth daily. 12/01/16  Yes [provider]  simvastatin (ZOCOR) 40 MG tablet Take 40 mg by mouth daily.   Yes [provider]  triamterene-hydrochlorothiazide (MAXZIDE-25) 37.5-25 MG per tablet Take 1 tablet by mouth daily.   Yes [provider]  zolpidem (AMBIEN) 10 MG tablet Take 10 mg by mouth at bedtime as needed for sleep.   Yes [provider]    Family History History reviewed. No pertinent family history.  Social History Social History   Tobacco Use  . Smoking status: Never Smoker  . Smokeless tobacco: Never Used  Substance Use Topics  . Alcohol use: No  . Drug use: No     Allergies   Patient has no known allergies.   Review of Systems Review of Systems  Unable to perform ROS: Mental status change     Physical Exam Updated Vital Signs BP (!) 188/76   Pulse (!) 31   Temp (!) 97 F (36.1 C) (Rectal)   Resp 17   SpO2 100%    Patient Vitals for the past 24 hrs:  BP Temp Temp src Pulse Resp SpO2  08/25/17 1330 (!) 168/73 99 F (37.2 C) - 94  17 98 %  08/25/17 1319 - 99 F (37.2 C) - 96 18 100 %  08/25/17 1200 (!) 188/76 - - - 17 -  08/25/17 1130 (!) 177/118 - - (!) 31 16 100 %  08/25/17 1030 (!) 220/188 - - (!) 30 17 100 %  08/25/17 1022 - - - (!) 59 16 98 %  08/25/17 1019 - - - (!) 28 14 100 %  08/25/17 1015 (!) 203/169 (!) 97 F (36.1 C) Rectal 89 19 94 %     Physical Exam 1030: Physical examination:  Nursing notes reviewed; Vital signs and O2 SAT reviewed;  Constitutional: Well developed, Well nourished, Disheveled. In no acute distress; Head:  Normocephalic, atraumatic; Eyes: EOMI, PERRL, No scleral icterus; ENMT: Mouth and pharynx normal, Mucous membranes dry; Neck: Supple, Full range of motion, No lymphadenopathy; Cardiovascular:  Tachycardic rate and rhythm, No gallop; Respiratory: Breath sounds clear & equal bilaterally, No wheezes. Normal respiratory effort/excursion; Chest: Nontender, Movement normal; Abdomen: Soft, Nontender, Nondistended, Normal bowel sounds; Genitourinary: No CVA tenderness; Spine:  No midline CS, TS, LS tenderness.;; Extremities: Peripheral pulses normal, No deformity, NT bilat UE's and LE's joints to palp. Pelvis stable. No edema, No calf edema or asymmetry.; Neuro: Awake, alert, confused re: time, place, events. No facial droop. Speech mumbling. Grips equal. Strength 5/5 equal bilat UE's, 4/5 bilat LE's..; Skin: Color normal, cool, Dry.   ED Treatments / Results  Labs (all labs ordered are listed, but only abnormal results are displayed)   EKG EKG Interpretation  Date/Time:  Friday August 25 2017 11:17:50 EDT Ventricular Rate:  87 PR Interval:    QRS Duration: 111 QT Interval:  365 QTC Calculation: 440 R Axis:   46 Text Interpretation:  Sinus tachycardia Ventricular trigeminy Biatrial enlargement Abnormal R-wave progression, early transition Nonspecific repol abnormality, diffuse leads Artifact When compared with ECG of 12/27/2016 Artifact is present no acute stemi Confirmed by Francine Graven [phone]) on 08/25/2017 11:59:12 AM    EKG Interpretation  Date/Time:  Friday August 25 2017 13:27:27 EDT Ventricular Rate:  106 PR Interval:    QRS Duration: 85 QT Interval:  359 QTC Calculation: 417 R Axis:   19 Text Interpretation:  Sinus tachycardia Multiform ventricular premature complexes Right atrial enlargement Nonspecific T abnormalities, lateral leads Baseline wander Artifact Since last tracing of earlier today less artifact is now present no acute stemi  Confirmed by Francine Graven [id number]) on 08/25/2017 1:48:41 PM        Radiology   Procedures Procedures (including critical care time)  Medications Ordered in ED Medications  0.9 %  sodium chloride infusion ( Intravenous New  Bag/Given 08/25/17 1216)     Initial Impression / Assessment and Plan / ED Course  I have reviewed the triage vital signs and the nursing notes.  Pertinent labs & imaging results that were available during my care of the patient were reviewed by me and considered in my medical decision making (see chart for details).  MDM Reviewed: previous chart, nursing note and vitals Reviewed previous: labs and ECG Interpretation: labs, ECG, x-ray and CT scan Total time providing critical care: 30-74 minutes. This excludes time spent performing separately reportable procedures and services. Consults: cardiology and admitting MD    CRITICAL CARE Performed by: Alfonzo Feller Total critical care time: 45 minutes Critical care time was exclusive of separately billable procedures and treating other patients. Critical care was necessary to treat or prevent imminent or life-threatening deterioration. Critical care was time spent personally by me on  the following activities: development of treatment plan with patient and/or surrogate as well as nursing, discussions with consultants, evaluation of patient's response to treatment, examination of patient, obtaining history from patient or surrogate, ordering and performing treatments and interventions, ordering and review of laboratory studies, ordering and review of radiographic studies, pulse oximetry and re-evaluation of patient's condition.   Results for orders placed or performed during the hospital encounter of 08/25/17  Comprehensive metabolic panel  Result Value Ref Range   Sodium 146 (H) 135 - 145 mmol/L   Potassium 5.6 (H) 3.5 - 5.1 mmol/L   Chloride 111 101 - 111 mmol/L   CO2 15 (L) 22 - 32 mmol/L   Glucose, Bld 178 (H) 65 - 99 mg/dL   BUN 89 (H) 6 - 20 mg/dL   Creatinine, Ser 2.79 (H) 0.61 - 1.24 mg/dL   Calcium 9.3 8.9 - 10.3 mg/dL   Total Protein 7.4 6.5 - 8.1 g/dL   Albumin 3.8 3.5 - 5.0 g/dL   AST 355 (H) 15 - 41 U/L   ALT 101 (H)  17 - 63 U/L   Alkaline Phosphatase 70 38 - 126 U/L   Total Bilirubin 0.9 0.3 - 1.2 mg/dL   GFR calc non Af Amer 19 (L) >60 mL/min   GFR calc Af Amer 22 (L) >60 mL/min   Anion gap 20 (H) 5 - 15  Acetaminophen level  Result Value Ref Range   Acetaminophen (Tylenol), Serum <10 (L) 10 - 30 ug/mL  Ethanol  Result Value Ref Range   Alcohol, Ethyl (B) <71 <69 mg/dL  Salicylate level  Result Value Ref Range   Salicylate Lvl <6.7 2.8 - 30.0 mg/dL  Troponin I  Result Value Ref Range   Troponin I 9.31 (HH) <0.03 ng/mL  Lactic acid, plasma  Result Value Ref Range   Lactic Acid, Venous 2.2 (HH) 0.5 - 1.9 mmol/L  CBC with Differential  Result Value Ref Range   WBC 11.6 (H) 4.0 - 10.5 K/uL   RBC 4.46 4.22 - 5.81 MIL/uL   Hemoglobin 12.1 (L) 13.0 - 17.0 g/dL   HCT 39.4 39.0 - 52.0 %   MCV 88.3 78.0 - 100.0 fL   MCH 27.1 26.0 - 34.0 pg   MCHC 30.7 30.0 - 36.0 g/dL   RDW 14.5 11.5 - 15.5 %   Platelets 216 150 - 400 K/uL   Neutrophils Relative % 88 %   Neutro Abs 10.2 (H) 1.7 - 7.7 K/uL   Lymphocytes Relative 12 %   Lymphs Abs 1.3 0.7 - 4.0 K/uL   Monocytes Relative 0 %   Monocytes Absolute 0.0 (L) 0.1 - 1.0 K/uL   Eosinophils Relative 0 %   Eosinophils Absolute 0.0 0.0 - 0.7 K/uL   Basophils Relative 0 %   Basophils Absolute 0.0 0.0 - 0.1 K/uL  Protime-INR  Result Value Ref Range   Prothrombin Time 14.4 11.4 - 15.2 seconds   INR 1.13   Urine rapid drug screen (hosp performed)  Result Value Ref Range   Opiates NONE DETECTED NONE DETECTED   Cocaine NONE DETECTED NONE DETECTED   Benzodiazepines NONE DETECTED NONE DETECTED   Amphetamines NONE DETECTED NONE DETECTED   Tetrahydrocannabinol NONE DETECTED NONE DETECTED   Barbiturates NONE DETECTED NONE DETECTED  Urinalysis, Routine w reflex microscopic  Result Value Ref Range   Color, Urine YELLOW YELLOW   APPearance HAZY (A) CLEAR   Specific Gravity, Urine 1.017 1.005 - 1.030   pH 5.0 5.0 - 8.0  Glucose, UA NEGATIVE NEGATIVE mg/dL    Hgb urine dipstick LARGE (A) NEGATIVE   Bilirubin Urine NEGATIVE NEGATIVE   Ketones, ur 5 (A) NEGATIVE mg/dL   Protein, ur 100 (A) NEGATIVE mg/dL   Nitrite NEGATIVE NEGATIVE   Leukocytes, UA NEGATIVE NEGATIVE   RBC / HPF 6-30 0 - 5 RBC/hpf   WBC, UA 0-5 0 - 5 WBC/hpf   Bacteria, UA RARE (A) NONE SEEN   Squamous Epithelial / LPF 0-5 (A) NONE SEEN   Mucus PRESENT    Budding Yeast PRESENT    Hyaline Casts, UA PRESENT    Uric Acid Crys, UA PRESENT    Dg Chest 1 View Result Date: 08/25/2017 CLINICAL DATA:  Confusion.  Found outside.  History of dementia. EXAM: CHEST  1 VIEW COMPARISON:  07/24/2014 FINDINGS: The cardiac silhouette is normal in size. The thoracic aorta is mildly tortuous with atherosclerotic calcification noted. No airspace consolidation, edema, pleural effusion, or pneumothorax is identified. A 1.7 cm ovoid density projecting below the right hemidiaphragm was not clearly present on the prior study and could reflect a calcified granuloma in the liver or posterior costophrenic sulcus. No acute osseous abnormality is seen. IMPRESSION: No active disease. Electronically Signed   By: Logan Bores M.D.   On: 08/25/2017 11:25   Ct Head Wo Contrast Result Date: 08/25/2017 CLINICAL DATA:  84 year old who was found outside at his house after laying on the ground all might. Hypothermia. EXAM: CT HEAD WITHOUT CONTRAST CT CERVICAL SPINE WITHOUT CONTRAST TECHNIQUE: Multidetector CT imaging of the head and cervical spine was performed following the standard protocol without intravenous contrast. Multiplanar CT image reconstructions of the cervical spine were also generated. COMPARISON:  MRI brain 08/21/2013.  CT head 08/20/2013. FINDINGS: CT HEAD FINDINGS Brain: Moderate to severe age related cortical and deep atrophy, progressive since 33. Moderate chronic microvascular ischemic changes of the white matter diffusely, unchanged. No mass lesion. No midline shift. No acute hemorrhage or hematoma. No  extra-axial fluid collections. No evidence of acute infarction. Incidental note of partial empty sella. Vascular: Moderate BILATERAL carotid siphon and mild BILATERAL vertebral artery atherosclerosis. No hyperdense vessel. Skull: No skull fracture or other focal osseous abnormality involving the skull. Sinuses/Orbits: Small mucous retention cyst or polyp involving the MEDIAL wall of the LEFT maxillary sinus. Minimal mucosal thickening involving the base of the frontal sinuses bilaterally. Remaining visualized paranasal sinuses, BILATERAL mastoid air cells and BILATERAL middle ear cavities well-aerated. Visualized orbits and globes normal in appearance. Other: None. CT CERVICAL SPINE FINDINGS Alignment: Anatomic POSTERIOR alignment. Straightening of the usual lordosis. Skull base and vertebrae: No fractures identified involving the cervical spine. No evidence of perched facets. Diffuse facet degenerative changes. Coronal reformatted images demonstrate an intact craniocervical junction, intact dens and intact lateral masses throughout. Lucent foci in the tip of the dens and in the MEDIAL RIGHT base of the dens likely represent degenerative cysts/geodes, as there are no lucent lesions elsewhere in the visualized skeleton. Soft tissues and spinal canal: No evidence of paraspinous or canal hematoma. Moderate to severe multifactorial spinal stenosis at C3-4 and mild multifactorial spinal stenosis at C5-6 and C6-7. Disc levels: Disc space narrowing and endplate hypertrophic changes at every cervical level and at C7-T1. Combination of uncinate and facet hypertrophy account for multilevel foraminal stenoses including: Mild BILATERAL C2-3, severe BILATERAL C3-4, moderate BILATERAL C4-5, severe BILATERAL C5-6, severe BILATERAL C6-7, moderate LEFT C7-T1. Upper chest: Visualized lung apices clear. Visualized superior mediastinum normal. Other: Mild atherosclerosis involving the LEFT  carotid artery bulb. IMPRESSION: CT Head: 1.  No acute intracranial abnormality. 2. Moderate to severe generalized atrophy and chronic microvascular ischemic changes of the white matter diffusely. CT Cervical Spine: 1. No fractures identified involving the cervical spine. 2. Multilevel degenerative disc disease, spondylosis and facet degenerative changes resulting in multilevel foraminal stenoses as detailed above. 3. Moderate to severe multifactorial spinal stenosis at C3-4 and mild multifactorial spinal stenosis at C5-6 and C6-7. Electronically Signed   By: Evangeline Dakin M.D.   On: 08/25/2017 11:13   Ct Cervical Spine Wo Contrast Result Date: 08/25/2017 CLINICAL DATA:  84 year old who was found outside at his house after laying on the ground all might. Hypothermia. EXAM: CT HEAD WITHOUT CONTRAST CT CERVICAL SPINE WITHOUT CONTRAST TECHNIQUE: Multidetector CT imaging of the head and cervical spine was performed following the standard protocol without intravenous contrast. Multiplanar CT image reconstructions of the cervical spine were also generated. COMPARISON:  MRI brain 08/21/2013.  CT head 08/20/2013. FINDINGS: CT HEAD FINDINGS Brain: Moderate to severe age related cortical and deep atrophy, progressive since 58. Moderate chronic microvascular ischemic changes of the white matter diffusely, unchanged. No mass lesion. No midline shift. No acute hemorrhage or hematoma. No extra-axial fluid collections. No evidence of acute infarction. Incidental note of partial empty sella. Vascular: Moderate BILATERAL carotid siphon and mild BILATERAL vertebral artery atherosclerosis. No hyperdense vessel. Skull: No skull fracture or other focal osseous abnormality involving the skull. Sinuses/Orbits: Small mucous retention cyst or polyp involving the MEDIAL wall of the LEFT maxillary sinus. Minimal mucosal thickening involving the base of the frontal sinuses bilaterally. Remaining visualized paranasal sinuses, BILATERAL mastoid air cells and BILATERAL middle ear  cavities well-aerated. Visualized orbits and globes normal in appearance. Other: None. CT CERVICAL SPINE FINDINGS Alignment: Anatomic POSTERIOR alignment. Straightening of the usual lordosis. Skull base and vertebrae: No fractures identified involving the cervical spine. No evidence of perched facets. Diffuse facet degenerative changes. Coronal reformatted images demonstrate an intact craniocervical junction, intact dens and intact lateral masses throughout. Lucent foci in the tip of the dens and in the MEDIAL RIGHT base of the dens likely represent degenerative cysts/geodes, as there are no lucent lesions elsewhere in the visualized skeleton. Soft tissues and spinal canal: No evidence of paraspinous or canal hematoma. Moderate to severe multifactorial spinal stenosis at C3-4 and mild multifactorial spinal stenosis at C5-6 and C6-7. Disc levels: Disc space narrowing and endplate hypertrophic changes at every cervical level and at C7-T1. Combination of uncinate and facet hypertrophy account for multilevel foraminal stenoses including: Mild BILATERAL C2-3, severe BILATERAL C3-4, moderate BILATERAL C4-5, severe BILATERAL C5-6, severe BILATERAL C6-7, moderate LEFT C7-T1. Upper chest: Visualized lung apices clear. Visualized superior mediastinum normal. Other: Mild atherosclerosis involving the LEFT carotid artery bulb. IMPRESSION: CT Head: 1. No acute intracranial abnormality. 2. Moderate to severe generalized atrophy and chronic microvascular ischemic changes of the white matter diffusely. CT Cervical Spine: 1. No fractures identified involving the cervical spine. 2. Multilevel degenerative disc disease, spondylosis and facet degenerative changes resulting in multilevel foraminal stenoses as detailed above. 3. Moderate to severe multifactorial spinal stenosis at C3-4 and mild multifactorial spinal stenosis at C5-6 and C6-7. Electronically Signed   By: Evangeline Dakin M.D.   On: 08/25/2017 11:13   Dg Hips Bilat With  Pelvis Min 5 Views Result Date: 08/25/2017 CLINICAL DATA:  CONFUSION, PT WAS FOUND OUTSIDE BY BROTHER THIS AM, DEMENTIA, HTN, NON SMOKER, PT WAS UNABLE TO GIVE ANY OTHER INFORMATION EXAM: DG HIP (WITH OR  WITHOUT PELVIS) 5+V BILAT COMPARISON:  None. FINDINGS: No fracture or dislocation.  No bone lesion. There is left greater than right concentric hip joint space narrowing with a greater degree of superolateral hip joint space narrowing on the left. Mild subchondral cystic change along the superolateral acetabula. SI joints and symphysis pubis are normally spaced and aligned. Bones are demineralized. Soft tissues are unremarkable. IMPRESSION: 1. No fracture, dislocation or acute finding. Electronically Signed   By: Lajean Manes M.D.   On: 08/25/2017 11:24    US Abdomen Limited Result Date: 08/25/2017 CLINICAL DATA:  Elevated liver function tests. EXAM: ULTRASOUND ABDOMEN LIMITED RIGHT UPPER QUADRANT COMPARISON:  05/11/2006 CT abdomen/pelvis. FINDINGS: Gallbladder: Gallbladder is mildly distended. There are two nonmobile faintly shadowing gallstones in the gallbladder neck measuring up to 7 mm. No gallbladder wall thickening. No pericholecystic fluid. By report, technologist is unable to assess for sonographic Murphy sign given history of dementia. Common bile duct: Diameter: 4 mm Liver: Simple 1.9 cm anterior left liver lobe cyst. No additional focal liver lesions. Liver parenchymal echogenicity and echotexture are within normal limits. Portal vein is patent on color Doppler imaging with normal direction of blood flow towards the liver. IMPRESSION: 1. Cholelithiasis. Mildly distended gallbladder. No gallbladder wall thickening. No pericholecystic fluid. Unable to assess for sonographic Murphy sign given patient's mental status. If there is clinical concern for acute cholecystitis, consider hepatobiliary scintigraphy study. 2. No biliary ductal dilatation. 3. Small simple left liver lobe cyst.  No suspicious  liver lesions. Electronically Signed   By: Ilona Sorrel M.D.   On: 08/25/2017 13:54     1345:  Warmed IVF and warming blanket placed on pt's arrival. BP improving without intervention. As pt has become warmer, he has become more combative and confused. Can be re-directed for a short time, but then becomes agitated again. IV ativan given. Troponin elevated, but EKG x2 without acute STEMI. IVF continues for AKI and mild lactic acid elevation; doubt sepsis. CK total pending d/t lab needing to dilute it several times. T/C returned from [facility] Har[facility] Challenger, case discussed, including:  HPI, pertinent PM/SHx, VS/PE, dx testing, ED course and treatment:  Agrees no acute stemi at this time, needs medical admit for multiple other medical issues.   T/C returned from [facility] [place] Pink, case discussed, including:  HPI, pertinent PM/SHx, VS/PE, dx testing, ED course and treatment:  Agreeable to admit.      Final Clinical Impressions(s) / ED Diagnoses   Final diagnoses:  Confusion    ED Discharge Orders    None       Francine Graven, DO 08/26/17 1458

## 2017-08-25 NOTE — ED Notes (Signed)
Date and time results received: 08/25/17 1450 (use smartphrase ".now" to insert current time)  Test: lac acid Critical Value: 2.4 Name of Provider Notified: [email or web address] Orders Received? Or Actions Taken?: none

## 2017-08-25 NOTE — ED Notes (Signed)
Unsuccessful IV stick.

## 2017-08-25 NOTE — ED Notes (Signed)
Pt temp 98.9 from temp foley  Warming blanket removed  Pt trying to get out of bed, I confused and has removed one IV and manipulated the other which has infiltrated. He has no memory of why he was outside, but recalls he was outside- He is confused and it is difficult to re-orient him.  Due to this, he is moved to room 3 for heightened visualization  He is offered water which he has no trouble swallowing and reports h is hungry

## 2017-08-25 NOTE — H&P (Signed)
History and Physical  Paul Perez [name]:[id number] DOB: 06-Nov-1927 DOA: 08/25/2017  Referring physician: Lurene Perez PCP: Paul Gravel, MD  Outpatient Specialists: None Patient coming from: Home & is able to ambulate without a cane or walker from what he tells me, unsure if this is correct  Chief Complaint: Confusion  HPI: Paul Perez is a 84 y.o. male with medical history significant for diabetes mellitus, dementia and hypertension who was last seen in the night before on his riding mower mowing the yard.  This morning, patient was reportedly found by his brother on the ground with a riding mower in the woods.  Patient was shaking and complaining of pain.  Much more confused than his baseline.  He reportedly had been outside all night.  Patient was brought into the emergency room.  ED Course: CT scan of the head noted no evidence of acute CVA, but did note significant atrophy from chronic dementia.  Cervical spine noted no evidence of fracture but did note moderate to severe multifactorial spinal stenosis.  Patient's lab work noted for a lactic acid level of 2.2, sodium 146 with a potassium of 5.6 and an anion gap of 20.  Creatinine elevated at 2.79 although from previous labs several months ago, creatinine at that time was 2.70.  White blood cell count was essentially normal although CK level was elevated at almost 22,000 with a troponin of 9.31.  Case was discussed with cardiology who felt that the troponin may be falsely elevated in the setting of overall rhabdomyolysis and less likely this was about a cardiac issue.  Patient started on IV fluids.  He was quite confused trying to dislodge and remove his IVs requiring mitts.  Follow-up lactic acid level 4 hours later now normalized at 0.5.  Troponin came down 4 hours later to 8.03.  Hospitalist were called for further evaluation and admission.  Urinalysis noted large amount of hemoglobin, but no white or red blood cells.  No bacteria little  yeast.  Tox screen negative.  Patient also noted to be bradycardic with a core body temperature of 94.6 initially.  Started on [facility]  Review of Systems: Patient seen in the emergency room.  He is confused, but he does respond to my questions directly.  He does not recall what happened last night or this morning Pt complains of his mittens on his hands and asked for my help in getting them off.  Pt denies any complaints including headache, vision changes, chest pain, palpitations, shortness of breath, wheeze, coughing, abdominal pain, hematuria, dysuria, constipation, diarrhea, focal extremity numbness weakness or pain.  Review of systems are otherwise negative   Past Medical History:  Diagnosis Date  . Dementia   . Diabetes mellitus without complication ([place])   . Encephalopathy   . Gastritis   . GIB (gastrointestinal bleeding)   . Heart problem   . Hypertension   . Poor historian    Past Surgical History:  Procedure Laterality Date  . INGUINAL HERNIA REPAIR  09/2010   Bilateral    Social History:  reports that he has never smoked. He has never used smokeless tobacco. He reports that he does not drink alcohol or use drugs.  He tells me he lives at home and is able to ambulate without help   No Known Allergies  Family history: None asked the patient if any medical problems were on his family he tells me no  Prior to Admission medications   Medication Sig Start Date End  Date Taking? Authorizing Provider  amLODipine (NORVASC) 5 MG tablet Take 5 mg by mouth daily.   Yes [provider]  lisinopril (PRINIVIL,ZESTRIL) 30 MG tablet Take 30 mg by mouth daily.    Yes [provider]  meclizine (ANTIVERT) 25 MG tablet Take 25 mg by mouth 3 (three) times daily as needed for dizziness.   Yes [provider]  metFORMIN (GLUCOPHAGE) 500 MG tablet Take 1 tablet by mouth 2 (two) times daily. 11/25/16  Yes [provider]  pioglitazone (ACTOS) 15 MG tablet  Take 1 tablet by mouth daily. 12/01/16  Yes [provider]  simvastatin (ZOCOR) 40 MG tablet Take 40 mg by mouth daily.   Yes [provider]  triamterene-hydrochlorothiazide (MAXZIDE-25) 37.5-25 MG per tablet Take 1 tablet by mouth daily.   Yes [provider]  zolpidem (AMBIEN) 10 MG tablet Take 10 mg by mouth at bedtime as needed for sleep.   Yes [provider]    Physical Exam: BP (!) 157/72   Pulse 81   Temp 98.4 F (36.9 C)   Resp 20   SpO2 97%   General: Awake, oriented x1 Eyes: Nonicteric, extraocular movements appear intact ENT: Normocephalic and atraumatic, mucous memories are slightly dry Neck: Supple, no JVD Cardiovascular: Bradycardic Respiratory: Clear to auscultation bilaterally Abdomen: Soft, nontender, nondistended, hypoactive bowel sounds Skin: No significant skin breaks, tears or lesions.  He has some mild bruising, but nothing focal Musculoskeletal: No clubbing or cyanosis, trace pitting edema Psychiatric: Chronic dementia with perhaps some underlying acute confusion Neurologic: No focal deficit although exam limited due to his confusion          Labs on Admission:  Basic Metabolic Panel: Recent Labs  Lab 08/25/17 1204  NA 146*  K 5.6*  CL 111  CO2 15*  GLUCOSE 178*  BUN 89*  CREATININE 2.79*  CALCIUM 9.3   Liver Function Tests: Recent Labs  Lab 08/25/17 1204  AST 355*  ALT 101*  ALKPHOS 70  BILITOT 0.9  PROT 7.4  ALBUMIN 3.8   No results for input(s): LIPASE, AMYLASE in the last 168 hours. No results for input(s): AMMONIA in the last 168 hours. CBC: Recent Labs  Lab 08/25/17 1204  WBC 11.6*  NEUTROABS 10.2*  HGB 12.1*  HCT 39.4  MCV 88.3  PLT 216   Cardiac Enzymes: Recent Labs  Lab 08/25/17 1204 08/25/17 1444  CKTOTAL 21,892*  --   TROPONINI 9.31* 8.03*    BNP (last 3 results) No results for input(s): BNP in the last 8760 hours.  ProBNP (last 3 results) No results for input(s):  PROBNP in the last 8760 hours.  CBG: Recent Labs  Lab 08/25/17 1654  GLUCAP 143*    Radiological Exams on Admission: Dg Chest 1 View  Result Date: 08/25/2017 CLINICAL DATA:  Confusion.  Found outside.  History of dementia. EXAM: CHEST  1 VIEW COMPARISON:  07/24/2014 FINDINGS: The cardiac silhouette is normal in size. The thoracic aorta is mildly tortuous with atherosclerotic calcification noted. No airspace consolidation, edema, pleural effusion, or pneumothorax is identified. A 1.7 cm ovoid density projecting below the right hemidiaphragm was not clearly present on the prior study and could reflect a calcified granuloma in the liver or posterior costophrenic sulcus. No acute osseous abnormality is seen. IMPRESSION: No active disease. Electronically Signed   By: Logan Bores M.D.   On: 08/25/2017 11:25   Ct Head Wo Contrast  Result Date: 08/25/2017 CLINICAL DATA:  84 year old who was found  outside at his house after laying on the ground all might. Hypothermia. EXAM: CT HEAD WITHOUT CONTRAST CT CERVICAL SPINE WITHOUT CONTRAST TECHNIQUE: Multidetector CT imaging of the head and cervical spine was performed following the standard protocol without intravenous contrast. Multiplanar CT image reconstructions of the cervical spine were also generated. COMPARISON:  MRI brain 08/21/2013.  CT head 08/20/2013. FINDINGS: CT HEAD FINDINGS Brain: Moderate to severe age related cortical and deep atrophy, progressive since 51. Moderate chronic microvascular ischemic changes of the white matter diffusely, unchanged. No mass lesion. No midline shift. No acute hemorrhage or hematoma. No extra-axial fluid collections. No evidence of acute infarction. Incidental note of partial empty sella. Vascular: Moderate BILATERAL carotid siphon and mild BILATERAL vertebral artery atherosclerosis. No hyperdense vessel. Skull: No skull fracture or other focal osseous abnormality involving the skull. Sinuses/Orbits: Small mucous  retention cyst or polyp involving the MEDIAL wall of the LEFT maxillary sinus. Minimal mucosal thickening involving the base of the frontal sinuses bilaterally. Remaining visualized paranasal sinuses, BILATERAL mastoid air cells and BILATERAL middle ear cavities well-aerated. Visualized orbits and globes normal in appearance. Other: None. CT CERVICAL SPINE FINDINGS Alignment: Anatomic POSTERIOR alignment. Straightening of the usual lordosis. Skull base and vertebrae: No fractures identified involving the cervical spine. No evidence of perched facets. Diffuse facet degenerative changes. Coronal reformatted images demonstrate an intact craniocervical junction, intact dens and intact lateral masses throughout. Lucent foci in the tip of the dens and in the MEDIAL RIGHT base of the dens likely represent degenerative cysts/geodes, as there are no lucent lesions elsewhere in the visualized skeleton. Soft tissues and spinal canal: No evidence of paraspinous or canal hematoma. Moderate to severe multifactorial spinal stenosis at C3-4 and mild multifactorial spinal stenosis at C5-6 and C6-7. Disc levels: Disc space narrowing and endplate hypertrophic changes at every cervical level and at C7-T1. Combination of uncinate and facet hypertrophy account for multilevel foraminal stenoses including: Mild BILATERAL C2-3, severe BILATERAL C3-4, moderate BILATERAL C4-5, severe BILATERAL C5-6, severe BILATERAL C6-7, moderate LEFT C7-T1. Upper chest: Visualized lung apices clear. Visualized superior mediastinum normal. Other: Mild atherosclerosis involving the LEFT carotid artery bulb. IMPRESSION: CT Head: 1. No acute intracranial abnormality. 2. Moderate to severe generalized atrophy and chronic microvascular ischemic changes of the white matter diffusely. CT Cervical Spine: 1. No fractures identified involving the cervical spine. 2. Multilevel degenerative disc disease, spondylosis and facet degenerative changes resulting in multilevel  foraminal stenoses as detailed above. 3. Moderate to severe multifactorial spinal stenosis at C3-4 and mild multifactorial spinal stenosis at C5-6 and C6-7. Electronically Signed   By: Evangeline Dakin M.D.   On: 08/25/2017 11:13   Ct Cervical Spine Wo Contrast  Result Date: 08/25/2017 CLINICAL DATA:  84 year old who was found outside at his house after laying on the ground all might. Hypothermia. EXAM: CT HEAD WITHOUT CONTRAST CT CERVICAL SPINE WITHOUT CONTRAST TECHNIQUE: Multidetector CT imaging of the head and cervical spine was performed following the standard protocol without intravenous contrast. Multiplanar CT image reconstructions of the cervical spine were also generated. COMPARISON:  MRI brain 08/21/2013.  CT head 08/20/2013. FINDINGS: CT HEAD FINDINGS Brain: Moderate to severe age related cortical and deep atrophy, progressive since 13. Moderate chronic microvascular ischemic changes of the white matter diffusely, unchanged. No mass lesion. No midline shift. No acute hemorrhage or hematoma. No extra-axial fluid collections. No evidence of acute infarction. Incidental note of partial empty sella. Vascular: Moderate BILATERAL carotid siphon and mild BILATERAL vertebral artery atherosclerosis. No hyperdense vessel. Skull:  No skull fracture or other focal osseous abnormality involving the skull. Sinuses/Orbits: Small mucous retention cyst or polyp involving the MEDIAL wall of the LEFT maxillary sinus. Minimal mucosal thickening involving the base of the frontal sinuses bilaterally. Remaining visualized paranasal sinuses, BILATERAL mastoid air cells and BILATERAL middle ear cavities well-aerated. Visualized orbits and globes normal in appearance. Other: None. CT CERVICAL SPINE FINDINGS Alignment: Anatomic POSTERIOR alignment. Straightening of the usual lordosis. Skull base and vertebrae: No fractures identified involving the cervical spine. No evidence of perched facets. Diffuse facet degenerative  changes. Coronal reformatted images demonstrate an intact craniocervical junction, intact dens and intact lateral masses throughout. Lucent foci in the tip of the dens and in the MEDIAL RIGHT base of the dens likely represent degenerative cysts/geodes, as there are no lucent lesions elsewhere in the visualized skeleton. Soft tissues and spinal canal: No evidence of paraspinous or canal hematoma. Moderate to severe multifactorial spinal stenosis at C3-4 and mild multifactorial spinal stenosis at C5-6 and C6-7. Disc levels: Disc space narrowing and endplate hypertrophic changes at every cervical level and at C7-T1. Combination of uncinate and facet hypertrophy account for multilevel foraminal stenoses including: Mild BILATERAL C2-3, severe BILATERAL C3-4, moderate BILATERAL C4-5, severe BILATERAL C5-6, severe BILATERAL C6-7, moderate LEFT C7-T1. Upper chest: Visualized lung apices clear. Visualized superior mediastinum normal. Other: Mild atherosclerosis involving the LEFT carotid artery bulb. IMPRESSION: CT Head: 1. No acute intracranial abnormality. 2. Moderate to severe generalized atrophy and chronic microvascular ischemic changes of the white matter diffusely. CT Cervical Spine: 1. No fractures identified involving the cervical spine. 2. Multilevel degenerative disc disease, spondylosis and facet degenerative changes resulting in multilevel foraminal stenoses as detailed above. 3. Moderate to severe multifactorial spinal stenosis at C3-4 and mild multifactorial spinal stenosis at C5-6 and C6-7. Electronically Signed   By: Evangeline Dakin M.D.   On: 08/25/2017 11:13   US Abdomen Limited  Result Date: 08/25/2017 CLINICAL DATA:  Elevated liver function tests. EXAM: ULTRASOUND ABDOMEN LIMITED RIGHT UPPER QUADRANT COMPARISON:  05/11/2006 CT abdomen/pelvis. FINDINGS: Gallbladder: Gallbladder is mildly distended. There are two nonmobile faintly shadowing gallstones in the gallbladder neck measuring up to 7 mm. No  gallbladder wall thickening. No pericholecystic fluid. By report, technologist is unable to assess for sonographic Murphy sign given history of dementia. Common bile duct: Diameter: 4 mm Liver: Simple 1.9 cm anterior left liver lobe cyst. No additional focal liver lesions. Liver parenchymal echogenicity and echotexture are within normal limits. Portal vein is patent on color Doppler imaging with normal direction of blood flow towards the liver. IMPRESSION: 1. Cholelithiasis. Mildly distended gallbladder. No gallbladder wall thickening. No pericholecystic fluid. Unable to assess for sonographic Murphy sign given patient's mental status. If there is clinical concern for acute cholecystitis, consider hepatobiliary scintigraphy study. 2. No biliary ductal dilatation. 3. Small simple left liver lobe cyst.  No suspicious liver lesions. Electronically Signed   By: Ilona Sorrel M.D.   On: 08/25/2017 13:54   Dg Hips Bilat With Pelvis Min 5 Views  Result Date: 08/25/2017 CLINICAL DATA:  CONFUSION, PT WAS FOUND OUTSIDE BY BROTHER THIS AM, DEMENTIA, HTN, NON SMOKER, PT WAS UNABLE TO GIVE ANY OTHER INFORMATION EXAM: DG HIP (WITH OR WITHOUT PELVIS) 5+V BILAT COMPARISON:  None. FINDINGS: No fracture or dislocation.  No bone lesion. There is left greater than right concentric hip joint space narrowing with a greater degree of superolateral hip joint space narrowing on the left. Mild subchondral cystic change along the superolateral acetabula. SI joints  and symphysis pubis are normally spaced and aligned. Bones are demineralized. Soft tissues are unremarkable. IMPRESSION: 1. No fracture, dislocation or acute finding. Electronically Signed   By: Lajean Manes M.D.   On: 08/25/2017 11:24    EKG: Independently reviewed.  PVCs and EKG notes sinus tachycardia.  This is since then changed to bradycardia with occasional PVCs  Assessment/Plan Present on Admission: . Senile dementia with behavioral disturbance/acute  encephalopathy: Acute event likely from hypothermia and rhabdomyolysis.  Monitor closely.  Continue mittens for now.  May require sitter.  PRN Ativan for agitation.  For now n.p.o., will check swallow evaluation at the bedside in the morning. Marland Kitchen Hypertension: Holding antihypertensives until intravascular line depletion better. Marland Kitchen AKI (acute kidney injury) ([place]) in the setting of stage III chronic kidney disease: Mild, will continue IV fluids to improve rhabdo and prevent worsening kidney function . Rhabdomyolysis: Secondary to laying on the ground overnight.  IV fluids, repeat CPK in the morning . Lactic acidosis: No evidence of sepsis, this is more from intravascular volume depletion . Elevated troponin: Followed by cardiology.  No evidence of acute ischemia at this time.  No evidence of ST elevated MI on EKG.  Repeat troponin in the morning.  Already trending downward . Acute hypernatremia: Secondary to rhabdomyolysis and dehydration, should improve with IV fluids . Hyperlipidemia: Holding statin, especially in the setting of acute rhabdomyolysis . Bradycardia: Secondary to hypothermia and should improve his core body temperature improved. Hypothermia: Secondary to being outside all night, should improve . Diabetes mellitus type 2, uncontrolled, with complications ([place]): Given n.p.o., sliding scale only for now.  Hold metformin  Principal Problem:   Rhabdomyolysis Active Problems:   Acute encephalopathy   Hypertension   AKI (acute kidney injury) ([place])   Bradycardia   Lactic acidosis   Elevated troponin   Acute hypernatremia   Hyperlipidemia   Diabetes mellitus type 2, uncontrolled, with complications ([facility])   DVT prophylaxis: SCDs  Code Status: For now, presumed full code.  Will clarify once patient is more lucid and/or have discussed with family  Family Communication: Left message for family  Disposition Plan: We will be here for several days, once rhabdo resolved, cardiac issues  ruled out  Consults called: Cardiology  Admission status: Given the patient will be here well past 2 midnights, requiring acute hospital services, have admitted as inpatient    Annita Brod MD [facility] Pager [phone]  If 7PM-7AM, please contact night-coverage [email or web address] [facility]  08/25/2017, 5:42 PM

## 2017-08-26 ENCOUNTER — Inpatient Hospital Stay (HOSPITAL_COMMUNITY): Payer: Medicare Other

## 2017-08-26 DIAGNOSIS — I340 Nonrheumatic mitral (valve) insufficiency: Secondary | ICD-10-CM

## 2017-08-26 LAB — ECHOCARDIOGRAM COMPLETE
HEIGHTINCHES: 70 in
WEIGHTICAEL: 2987.67 [oz_av]

## 2017-08-26 LAB — GLUCOSE, CAPILLARY
GLUCOSE-CAPILLARY: 115 mg/dL — AB (ref 65–99)
Glucose-Capillary: 127 mg/dL — ABNORMAL HIGH (ref 65–99)
Glucose-Capillary: 105 mg/dL — ABNORMAL HIGH (ref 65–99)
Glucose-Capillary: 123 mg/dL — ABNORMAL HIGH (ref 65–99)

## 2017-08-26 LAB — BASIC METABOLIC PANEL
ANION GAP: 13 (ref 5–15)
BUN: 96 mg/dL — AB (ref 6–20)
CO2: 14 mmol/L — ABNORMAL LOW (ref 22–32)
Calcium: 8.3 mg/dL — ABNORMAL LOW (ref 8.9–10.3)
Chloride: 120 mmol/L — ABNORMAL HIGH (ref 101–111)
Creatinine, Ser: 2.73 mg/dL — ABNORMAL HIGH (ref 0.61–1.24)
GFR calc Af Amer: 22 mL/min — ABNORMAL LOW (ref >60)
GFR, EST NON AFRICAN AMERICAN: 19 mL/min — AB (ref >60)
Glucose, Bld: 153 mg/dL — ABNORMAL HIGH (ref 65–99)
POTASSIUM: 5 mmol/L (ref 3.5–5.1)
Sodium: 147 mmol/L — ABNORMAL HIGH (ref 135–145)

## 2017-08-26 LAB — URINE CULTURE: Culture: NO GROWTH

## 2017-08-26 LAB — LACTIC ACID, PLASMA: Lactic Acid, Venous: 1 mmol/L (ref 0.5–1.9)

## 2017-08-26 LAB — MRSA PCR SCREENING: MRSA by PCR: NEGATIVE

## 2017-08-26 LAB — TROPONIN I: Troponin I: 4.45 ng/mL

## 2017-08-26 LAB — CK: Total CK: 13395 U/L — ABNORMAL HIGH (ref 49–397)

## 2017-08-26 MED ORDER — LORAZEPAM 2 MG/ML IJ SOLN
1.0000 mg | Freq: Once | INTRAMUSCULAR | Status: AC
  Administered 2017-08-26: 1 mg via INTRAVENOUS
  Filled 2017-08-26: qty 1

## 2017-08-26 MED ORDER — SODIUM CHLORIDE 0.45 % IV SOLN
INTRAVENOUS | Status: DC
  Administered 2017-08-26 – 2017-08-27 (×4): via INTRAVENOUS

## 2017-08-26 NOTE — Progress Notes (Signed)
PROGRESS NOTE    NICOLE HAFLEY  CWC:[id number] DOB: [birth year] DOA: 08/25/2017 PCP: Jani Gravel, MD   Brief Narrative:   KEKOA FYOCK is a 84 y.o. male with medical history significant for diabetes mellitus, dementia and hypertension who was last seen in the night before on his riding mower mowing the yard.  He was reportedly found by his brother on the ground and the patient was noted to be shaking with complaints of pain.  He is brought to the emergency department with hypothermia and is admitted with rhabdomyolysis along with AK I on CKD stage III secondarily.  He has been placed on aggressive IV fluid hydration with improvement in rhabdomyolysis noted thus far.   Assessment & Plan:   Principal Problem:   Rhabdomyolysis Active Problems:   Acute encephalopathy   Hypertension   AKI (acute kidney injury) (HCC)   Bradycardia   Lactic acidosis   Elevated troponin   Acute hypernatremia   Hyperlipidemia   Diabetes mellitus type 2, uncontrolled, with complications (HCC)   Hypothermia   1. Acute encephalopathy-multifactorial in setting of senile dementia with behavioral disturbances.  Continue mittens with close monitoring and sitter at bedside while in stepdown unit.  Ativan has helped significantly with agitation.  Monitor with neuro checks.  Will discuss further when family is available. 2. Hypertension.  Continue to monitor closely.  Currently off home blood pressure medications with good control noted. 3. AK I on CKD stage III.  Minimal improvement noted while on IV fluid.  This appears to be secondary to rhabdomyolysis.  Continue to monitor with repeat a.m. labs and avoid nephrotoxic agents. 4. Rhabdomyolysis with associated troponin elevation.  Both CK and troponin levels are currently downtrending and it is felt that troponin elevation is not related to ACS but rather the rhabdomyolysis.  Cardiology to further evaluate by Monday.  Evaluate further with 2D echocardiogram to  ensure that there are no wall motion abnormalities compared to echo on 08/2013. 5. Hyponatremia.  Switch IV fluid from normal saline to half-normal saline as he is also noted to have hyperchloremia. 6. Dyslipidemia.  Continue to hold statin in face of acute rhabdomyolysis. 7. Hypothermia with associated bradycardia.  Improved core body temperature and heart rate noted at this time. 8. Type 2 diabetes-uncontrolled.  Hold metformin with sliding scale insulin for now.  Adequate blood glucose control currently noted.   DVT prophylaxis: SCDs Code Status: Full code Family Communication: None currently at bedside Disposition Plan: Continue aggressive IV fluid hydration in stepdown unit with close monitoring.   Consultants:   Cardiology  Procedures:   None  Antimicrobials:   None   Subjective: Patient seen and evaluated today with sitter at bedside.  No family members have stopped by as of yet.  His agitation has improved with the use of Ativan and he is currently quite sedated.  Core temperature has improved and vital signs are currently stable.  Objective: Vitals:   08/26/17 0503 08/26/17 0610 08/26/17 0700 08/26/17 0724  BP: (!) 111/46 (!) 127/47 119/68   Pulse:      Resp: 14 14 19 14   Temp: 99.3 F (37.4 C) 99.1 F (37.3 C) 99.1 F (37.3 C) 99.1 F (37.3 C)  TempSrc:      SpO2:      Weight:      Height:        Intake/Output Summary (Last 24 hours) at 08/26/2017 0926 Last data filed at 08/26/2017 0607 Gross per 24 hour  Intake 3377.24  ml  Output 1200 ml  Net 2177.24 ml   Filed Weights   08/25/17 1838 08/26/17 0408  Weight: 84.6 kg (186 lb 8.2 oz) 84.7 kg (186 lb 11.7 oz)    Examination:  General exam: Appears calm and comfortable  Respiratory system: Clear to auscultation. Respiratory effort normal. Cardiovascular system: S1 & S2 heard, RRR. No JVD, murmurs, rubs, gallops or clicks. No pedal edema. Gastrointestinal system: Abdomen is nondistended, soft and  nontender. No organomegaly or masses felt. Normal bowel sounds heard. Central nervous system: Alert and oriented. No focal neurological deficits. Extremities: Symmetric 5 x 5 power. Skin: No rashes, lesions or ulcers Psychiatry: Judgement and insight appear normal. Mood & affect appropriate.     Data Reviewed: I have personally reviewed following labs and imaging studies  CBC: Recent Labs  Lab 08/25/17 1204  WBC 11.6*  NEUTROABS 10.2*  HGB 12.1*  HCT 39.4  MCV 88.3  PLT 716   Basic Metabolic Panel: Recent Labs  Lab 08/25/17 1204 08/26/17 0539  NA 146* 147*  K 5.6* 5.0  CL 111 120*  CO2 15* 14*  GLUCOSE 178* 153*  BUN 89* 96*  CREATININE 2.79* 2.73*  CALCIUM 9.3 8.3*   GFR: Estimated Creatinine Clearance: 19.3 mL/min (A) (by C-G formula based on SCr of 2.73 mg/dL (H)). Liver Function Tests: Recent Labs  Lab 08/25/17 1204  AST 355*  ALT 101*  ALKPHOS 70  BILITOT 0.9  PROT 7.4  ALBUMIN 3.8   No results for input(s): LIPASE, AMYLASE in the last 168 hours. No results for input(s): AMMONIA in the last 168 hours. Coagulation Profile: Recent Labs  Lab 08/25/17 1204  INR 1.13   Cardiac Enzymes: Recent Labs  Lab 08/25/17 1204 08/25/17 1444 08/25/17 2207 08/26/17 0539  CKTOTAL 21,892*  --   --  13,395*  TROPONINI 9.31* 8.03* 6.94* 4.45*   BNP (last 3 results) No results for input(s): PROBNP in the last 8760 hours. HbA1C: Recent Labs    08/25/17 1602  HGBA1C 6.4*   CBG: Recent Labs  Lab 08/25/17 1654 08/25/17 2133 08/26/17 0722  GLUCAP 143* 106* 127*   Lipid Profile: No results for input(s): CHOL, HDL, LDLCALC, TRIG, CHOLHDL, LDLDIRECT in the last 72 hours. Thyroid Function Tests: Recent Labs    08/25/17 1602  TSH 1.690   Anemia Panel: No results for input(s): VITAMINB12, FOLATE, FERRITIN, TIBC, IRON, RETICCTPCT in the last 72 hours. Sepsis Labs: Recent Labs  Lab 08/25/17 1204 08/25/17 1231 08/25/17 1633 08/26/17 0539    LATICACIDVEN 2.2* 2.4* 0.5 1.0    Recent Results (from the past 240 hour(s))  MRSA PCR Screening     Status: None   Collection Time: 08/25/17  6:21 PM  Result Value Ref Range Status   MRSA by PCR NEGATIVE NEGATIVE Final    Comment:        The GeneXpert MRSA Assay (FDA approved for NASAL specimens only), is one component of a comprehensive MRSA colonization surveillance program. It is not intended to diagnose MRSA infection nor to guide or monitor treatment for MRSA infections. Performed at [facility], [address], [place], [place] [address]          Radiology Studies: Dg Chest 1 View  Result Date: 08/25/2017 CLINICAL DATA:  Confusion.  Found outside.  History of dementia. EXAM: CHEST  1 VIEW COMPARISON:  07/24/2014 FINDINGS: The cardiac silhouette is normal in size. The thoracic aorta is mildly tortuous with atherosclerotic calcification noted. No airspace consolidation, edema, pleural effusion, or  pneumothorax is identified. A 1.7 cm ovoid density projecting below the right hemidiaphragm was not clearly present on the prior study and could reflect a calcified granuloma in the liver or posterior costophrenic sulcus. No acute osseous abnormality is seen. IMPRESSION: No active disease. Electronically Signed   By: Logan Bores M.D.   On: 08/25/2017 11:25   Ct Head Wo Contrast  Result Date: 08/25/2017 CLINICAL DATA:  84 year old who was found outside at his house after laying on the ground all might. Hypothermia. EXAM: CT HEAD WITHOUT CONTRAST CT CERVICAL SPINE WITHOUT CONTRAST TECHNIQUE: Multidetector CT imaging of the head and cervical spine was performed following the standard protocol without intravenous contrast. Multiplanar CT image reconstructions of the cervical spine were also generated. COMPARISON:  MRI brain 08/21/2013.  CT head 08/20/2013. FINDINGS: CT HEAD FINDINGS Brain: Moderate to severe age related cortical and deep atrophy, progressive since 24. Moderate chronic  microvascular ischemic changes of the white matter diffusely, unchanged. No mass lesion. No midline shift. No acute hemorrhage or hematoma. No extra-axial fluid collections. No evidence of acute infarction. Incidental note of partial empty sella. Vascular: Moderate BILATERAL carotid siphon and mild BILATERAL vertebral artery atherosclerosis. No hyperdense vessel. Skull: No skull fracture or other focal osseous abnormality involving the skull. Sinuses/Orbits: Small mucous retention cyst or polyp involving the MEDIAL wall of the LEFT maxillary sinus. Minimal mucosal thickening involving the base of the frontal sinuses bilaterally. Remaining visualized paranasal sinuses, BILATERAL mastoid air cells and BILATERAL middle ear cavities well-aerated. Visualized orbits and globes normal in appearance. Other: None. CT CERVICAL SPINE FINDINGS Alignment: Anatomic POSTERIOR alignment. Straightening of the usual lordosis. Skull base and vertebrae: No fractures identified involving the cervical spine. No evidence of perched facets. Diffuse facet degenerative changes. Coronal reformatted images demonstrate an intact craniocervical junction, intact dens and intact lateral masses throughout. Lucent foci in the tip of the dens and in the MEDIAL RIGHT base of the dens likely represent degenerative cysts/geodes, as there are no lucent lesions elsewhere in the visualized skeleton. Soft tissues and spinal canal: No evidence of paraspinous or canal hematoma. Moderate to severe multifactorial spinal stenosis at C3-4 and mild multifactorial spinal stenosis at C5-6 and C6-7. Disc levels: Disc space narrowing and endplate hypertrophic changes at every cervical level and at C7-T1. Combination of uncinate and facet hypertrophy account for multilevel foraminal stenoses including: Mild BILATERAL C2-3, severe BILATERAL C3-4, moderate BILATERAL C4-5, severe BILATERAL C5-6, severe BILATERAL C6-7, moderate LEFT C7-T1. Upper chest: Visualized lung  apices clear. Visualized superior mediastinum normal. Other: Mild atherosclerosis involving the LEFT carotid artery bulb. IMPRESSION: CT Head: 1. No acute intracranial abnormality. 2. Moderate to severe generalized atrophy and chronic microvascular ischemic changes of the white matter diffusely. CT Cervical Spine: 1. No fractures identified involving the cervical spine. 2. Multilevel degenerative disc disease, spondylosis and facet degenerative changes resulting in multilevel foraminal stenoses as detailed above. 3. Moderate to severe multifactorial spinal stenosis at C3-4 and mild multifactorial spinal stenosis at C5-6 and C6-7. Electronically Signed   By: Evangeline Dakin M.D.   On: 08/25/2017 11:13   Ct Cervical Spine Wo Contrast  Result Date: 08/25/2017 CLINICAL DATA:  84 year old who was found outside at his house after laying on the ground all might. Hypothermia. EXAM: CT HEAD WITHOUT CONTRAST CT CERVICAL SPINE WITHOUT CONTRAST TECHNIQUE: Multidetector CT imaging of the head and cervical spine was performed following the standard protocol without intravenous contrast. Multiplanar CT image reconstructions of the cervical spine were also generated. COMPARISON:  MRI  brain 08/21/2013.  CT head 08/20/2013. FINDINGS: CT HEAD FINDINGS Brain: Moderate to severe age related cortical and deep atrophy, progressive since 70. Moderate chronic microvascular ischemic changes of the white matter diffusely, unchanged. No mass lesion. No midline shift. No acute hemorrhage or hematoma. No extra-axial fluid collections. No evidence of acute infarction. Incidental note of partial empty sella. Vascular: Moderate BILATERAL carotid siphon and mild BILATERAL vertebral artery atherosclerosis. No hyperdense vessel. Skull: No skull fracture or other focal osseous abnormality involving the skull. Sinuses/Orbits: Small mucous retention cyst or polyp involving the MEDIAL wall of the LEFT maxillary sinus. Minimal mucosal thickening  involving the base of the frontal sinuses bilaterally. Remaining visualized paranasal sinuses, BILATERAL mastoid air cells and BILATERAL middle ear cavities well-aerated. Visualized orbits and globes normal in appearance. Other: None. CT CERVICAL SPINE FINDINGS Alignment: Anatomic POSTERIOR alignment. Straightening of the usual lordosis. Skull base and vertebrae: No fractures identified involving the cervical spine. No evidence of perched facets. Diffuse facet degenerative changes. Coronal reformatted images demonstrate an intact craniocervical junction, intact dens and intact lateral masses throughout. Lucent foci in the tip of the dens and in the MEDIAL RIGHT base of the dens likely represent degenerative cysts/geodes, as there are no lucent lesions elsewhere in the visualized skeleton. Soft tissues and spinal canal: No evidence of paraspinous or canal hematoma. Moderate to severe multifactorial spinal stenosis at C3-4 and mild multifactorial spinal stenosis at C5-6 and C6-7. Disc levels: Disc space narrowing and endplate hypertrophic changes at every cervical level and at C7-T1. Combination of uncinate and facet hypertrophy account for multilevel foraminal stenoses including: Mild BILATERAL C2-3, severe BILATERAL C3-4, moderate BILATERAL C4-5, severe BILATERAL C5-6, severe BILATERAL C6-7, moderate LEFT C7-T1. Upper chest: Visualized lung apices clear. Visualized superior mediastinum normal. Other: Mild atherosclerosis involving the LEFT carotid artery bulb. IMPRESSION: CT Head: 1. No acute intracranial abnormality. 2. Moderate to severe generalized atrophy and chronic microvascular ischemic changes of the white matter diffusely. CT Cervical Spine: 1. No fractures identified involving the cervical spine. 2. Multilevel degenerative disc disease, spondylosis and facet degenerative changes resulting in multilevel foraminal stenoses as detailed above. 3. Moderate to severe multifactorial spinal stenosis at C3-4 and  mild multifactorial spinal stenosis at C5-6 and C6-7. Electronically Signed   By: Evangeline Dakin M.D.   On: 08/25/2017 11:13   US Abdomen Limited  Result Date: 08/25/2017 CLINICAL DATA:  Elevated liver function tests. EXAM: ULTRASOUND ABDOMEN LIMITED RIGHT UPPER QUADRANT COMPARISON:  05/11/2006 CT abdomen/pelvis. FINDINGS: Gallbladder: Gallbladder is mildly distended. There are two nonmobile faintly shadowing gallstones in the gallbladder neck measuring up to 7 mm. No gallbladder wall thickening. No pericholecystic fluid. By report, technologist is unable to assess for sonographic Murphy sign given history of dementia. Common bile duct: Diameter: 4 mm Liver: Simple 1.9 cm anterior left liver lobe cyst. No additional focal liver lesions. Liver parenchymal echogenicity and echotexture are within normal limits. Portal vein is patent on color Doppler imaging with normal direction of blood flow towards the liver. IMPRESSION: 1. Cholelithiasis. Mildly distended gallbladder. No gallbladder wall thickening. No pericholecystic fluid. Unable to assess for sonographic Murphy sign given patient's mental status. If there is clinical concern for acute cholecystitis, consider hepatobiliary scintigraphy study. 2. No biliary ductal dilatation. 3. Small simple left liver lobe cyst.  No suspicious liver lesions. Electronically Signed   By: Ilona Sorrel M.D.   On: 08/25/2017 13:54   Dg Hips Bilat With Pelvis Min 5 Views  Result Date: 08/25/2017 CLINICAL DATA:  CONFUSION,  PT WAS FOUND OUTSIDE BY BROTHER THIS AM, DEMENTIA, HTN, NON SMOKER, PT WAS UNABLE TO GIVE ANY OTHER INFORMATION EXAM: DG HIP (WITH OR WITHOUT PELVIS) 5+V BILAT COMPARISON:  None. FINDINGS: No fracture or dislocation.  No bone lesion. There is left greater than right concentric hip joint space narrowing with a greater degree of superolateral hip joint space narrowing on the left. Mild subchondral cystic change along the superolateral acetabula. SI joints and  symphysis pubis are normally spaced and aligned. Bones are demineralized. Soft tissues are unremarkable. IMPRESSION: 1. No fracture, dislocation or acute finding. Electronically Signed   By: Lajean Manes M.D.   On: 08/25/2017 11:24        Scheduled Meds: . insulin aspart  0-9 Units Subcutaneous TID WC   Continuous Infusions: . sodium chloride    . sodium chloride 100 mL/hr at 08/26/17 0607     LOS: 1 day    Time spent: 30 minutes    Mikey Maffett Darleen Crocker, DO [facility] Pager [phone]  If 7PM-7AM, please contact night-coverage [email or web address] Password [facility] 08/26/2017, 9:26 AM

## 2017-08-26 NOTE — Progress Notes (Signed)
*  PRELIMINARY RESULTS* Echocardiogram 2D Echocardiogram has been performed.  Paul Perez 08/26/2017, 12:56 PM

## 2017-08-26 NOTE — Progress Notes (Signed)
Admission questions not able to be answered due to pt's confusion. Will delegate to day shift RN when family is present to help answer questions.  Margaret Pyle, RN

## 2017-08-27 LAB — GLUCOSE, CAPILLARY
GLUCOSE-CAPILLARY: 119 mg/dL — AB (ref 65–99)
GLUCOSE-CAPILLARY: 164 mg/dL — AB (ref 65–99)
Glucose-Capillary: 125 mg/dL — ABNORMAL HIGH (ref 65–99)
Glucose-Capillary: 126 mg/dL — ABNORMAL HIGH (ref 65–99)

## 2017-08-27 LAB — BASIC METABOLIC PANEL
ANION GAP: 13 (ref 5–15)
BUN: 92 mg/dL — AB (ref 6–20)
CO2: 12 mmol/L — ABNORMAL LOW (ref 22–32)
Calcium: 8.3 mg/dL — ABNORMAL LOW (ref 8.9–10.3)
Chloride: 124 mmol/L — ABNORMAL HIGH (ref 101–111)
Creatinine, Ser: 2.63 mg/dL — ABNORMAL HIGH (ref 0.61–1.24)
GFR calc Af Amer: 23 mL/min — ABNORMAL LOW (ref >60)
GFR, EST NON AFRICAN AMERICAN: 20 mL/min — AB (ref >60)
GLUCOSE: 138 mg/dL — AB (ref 65–99)
POTASSIUM: 4.6 mmol/L (ref 3.5–5.1)
Sodium: 149 mmol/L — ABNORMAL HIGH (ref 135–145)

## 2017-08-27 LAB — CBC
HEMATOCRIT: 29.3 % — AB (ref 39.0–52.0)
HEMOGLOBIN: 8.9 g/dL — AB (ref 13.0–17.0)
MCH: 27 pg (ref 26.0–34.0)
MCHC: 30.4 g/dL (ref 30.0–36.0)
MCV: 88.8 fL (ref 78.0–100.0)
Platelets: 114 10*3/uL — ABNORMAL LOW (ref 150–400)
RBC: 3.3 MIL/uL — AB (ref 4.22–5.81)
RDW: 14.8 % (ref 11.5–15.5)
WBC: 5.6 10*3/uL (ref 4.0–10.5)

## 2017-08-27 LAB — CK: CK TOTAL: 8539 U/L — AB (ref 49–397)

## 2017-08-27 MED ORDER — DEXTROSE-NACL 5-0.2 % IV SOLN
INTRAVENOUS | Status: DC
  Administered 2017-08-27 – 2017-08-28 (×2): via INTRAVENOUS
  Filled 2017-08-27 (×2): qty 1000

## 2017-08-27 MED ORDER — INSULIN ASPART 100 UNIT/ML ~~LOC~~ SOLN
0.0000 [IU] | SUBCUTANEOUS | Status: DC
  Administered 2017-08-27 – 2017-08-28 (×2): 3 [IU] via SUBCUTANEOUS
  Administered 2017-08-28: 2 [IU] via SUBCUTANEOUS
  Administered 2017-08-29 (×2): 3 [IU] via SUBCUTANEOUS
  Administered 2017-08-29 (×2): 2 [IU] via SUBCUTANEOUS
  Administered 2017-08-30: 3 [IU] via SUBCUTANEOUS

## 2017-08-27 MED ORDER — AMLODIPINE BESYLATE 5 MG PO TABS
5.0000 mg | ORAL_TABLET | Freq: Every day | ORAL | Status: DC
  Administered 2017-08-27 – 2017-08-30 (×4): 5 mg via ORAL
  Filled 2017-08-27 (×4): qty 1

## 2017-08-27 NOTE — Progress Notes (Signed)
PROGRESS NOTE    Paul Perez  QJJ:[id number] DOB: [birth year] DOA: 08/25/2017 PCP: Jani Gravel, MD   Brief Narrative:   Paul Perez is a 84 y.o. male with medical history significant for diabetes mellitus, dementia and hypertension who was last seen in the night before on his riding mower mowing the yard.  He was reportedly found by his brother on the ground and the patient was noted to be shaking with complaints of pain.  He is brought to the emergency department with hypothermia and is admitted with rhabdomyolysis along with AK I on CKD stage III secondarily.  He has been placed on aggressive IV fluid hydration with improvement in rhabdomyolysis noted thus far.   Assessment & Plan:   Principal Problem:   Rhabdomyolysis Active Problems:   Acute encephalopathy   Hypertension   AKI (acute kidney injury) (HCC)   Bradycardia   Lactic acidosis   Elevated troponin   Acute hypernatremia   Hyperlipidemia   Diabetes mellitus type 2, uncontrolled, with complications (HCC)   Hypothermia   1. Acute encephalopathy-multifactorial in setting of senile dementia with behavioral disturbances.  This appears to be improved and he has been moved to the floor with tele-sitter.  Will work on removing mittens today. 2. Hypertension.  Continue to monitor closely.  Currently off home blood pressure medications with good control noted.  Restart amlodipine 5 mg today 3. AK I on CKD stage III.  Minimal improvement noted while on IV fluid.  This appears to be secondary to rhabdomyolysis.  Continue to monitor with repeat a.m. labs and avoid nephrotoxic agents. 4. Rhabdomyolysis with associated troponin elevation.  Both CK and troponin levels are currently downtrending and it is felt that troponin elevation is not related to ACS but rather the rhabdomyolysis.  Cardiology to further evaluate by Monday.  2D echocardiogram with no significant findings aside from grade 1 diastolic dysfunction.  EF  60-65%. 5. Hypernatremia and hyperchloremia.  This has failed to improve with half normal saline and therefore will switch to D5 quarter normal saline with close monitoring of blood glucose levels. 6. Dyslipidemia.  Continue to hold statin in face of acute rhabdomyolysis. 7. Hypothermia with associated bradycardia.  Improved core body temperature and heart rate noted at this time. 8. Type 2 diabetes-uncontrolled.  Sliding scale insulin adjusted to every 4 hours due to D5 infusion.  Diet advanced to soft.   DVT prophylaxis: SCDs Code Status: Full code Family Communication: None currently at bedside Disposition Plan: Continue aggressive IV fluid hydration in stepdown unit with close monitoring.   Consultants:   Cardiology  Procedures:   None  Antimicrobials:   None   Subjective: Patient seen and evaluated today with no acute complaints or concerns noted.  He would like to have his maintenance taken off of possible.  Spoke with family members at bedside yesterday who plan to have caregivers at home set up when he is ready for discharge.  Objective: Vitals:   08/26/17 1601 08/26/17 2154 08/26/17 2158 08/27/17 0535  BP:  (!) 187/83 (!) 158/91 (!) 143/64  Pulse: (!) 103 85 85 (!) 48  Resp: (!) 28 18  18   Temp: 99.1 F (37.3 C) 97.9 F (36.6 C) 98.2 F (36.8 C) 97.7 F (36.5 C)  TempSrc:  Oral Oral Oral  SpO2: 96%  100% 99%  Weight:      Height:        Intake/Output Summary (Last 24 hours) at 08/27/2017 1023 Last data filed at 08/27/2017  0800 Gross per 24 hour  Intake 2587.5 ml  Output 1150 ml  Net 1437.5 ml   Filed Weights   08/25/17 1838 08/26/17 0408  Weight: 84.6 kg (186 lb 8.2 oz) 84.7 kg (186 lb 11.7 oz)    Examination:  General exam: Appears calm and comfortable  Respiratory system: Clear to auscultation. Respiratory effort normal. Cardiovascular system: S1 & S2 heard, RRR. No JVD, murmurs, rubs, gallops or clicks. No pedal edema. Gastrointestinal system:  Abdomen is nondistended, soft and nontender. No organomegaly or masses felt. Normal bowel sounds heard. Central nervous system: Alert and oriented. No focal neurological deficits. Extremities: Symmetric 5 x 5 power. Skin: No rashes, lesions or ulcers Psychiatry: Judgement and insight appear normal. Mood & affect appropriate.     Data Reviewed: I have personally reviewed following labs and imaging studies  CBC: Recent Labs  Lab 08/25/17 1204 08/27/17 0607  WBC 11.6* 5.6  NEUTROABS 10.2*  --   HGB 12.1* 8.9*  HCT 39.4 29.3*  MCV 88.3 88.8  PLT 216 621*   Basic Metabolic Panel: Recent Labs  Lab 08/25/17 1204 08/26/17 0539 08/27/17 0607  NA 146* 147* 149*  K 5.6* 5.0 4.6  CL 111 120* 124*  CO2 15* 14* 12*  GLUCOSE 178* 153* 138*  BUN 89* 96* 92*  CREATININE 2.79* 2.73* 2.63*  CALCIUM 9.3 8.3* 8.3*   GFR: Estimated Creatinine Clearance: 20 mL/min (A) (by C-G formula based on SCr of 2.63 mg/dL (H)). Liver Function Tests: Recent Labs  Lab 08/25/17 1204  AST 355*  ALT 101*  ALKPHOS 70  BILITOT 0.9  PROT 7.4  ALBUMIN 3.8   No results for input(s): LIPASE, AMYLASE in the last 168 hours. No results for input(s): AMMONIA in the last 168 hours. Coagulation Profile: Recent Labs  Lab 08/25/17 1204  INR 1.13   Cardiac Enzymes: Recent Labs  Lab 08/25/17 1204 08/25/17 1444 08/25/17 2207 08/26/17 0539 08/27/17 0607  CKTOTAL 21,892*  --   --  13,395* 8,539*  TROPONINI 9.31* 8.03* 6.94* 4.45*  --    BNP (last 3 results) No results for input(s): PROBNP in the last 8760 hours. HbA1C: Recent Labs    08/25/17 1602  HGBA1C 6.4*   CBG: Recent Labs  Lab 08/26/17 0722 08/26/17 1206 08/26/17 1603 08/26/17 2208 08/27/17 0727  GLUCAP 127* 105* 123* 115* 119*   Lipid Profile: No results for input(s): CHOL, HDL, LDLCALC, TRIG, CHOLHDL, LDLDIRECT in the last 72 hours. Thyroid Function Tests: Recent Labs    08/25/17 1602  TSH 1.690   Anemia Panel: No  results for input(s): VITAMINB12, FOLATE, FERRITIN, TIBC, IRON, RETICCTPCT in the last 72 hours. Sepsis Labs: Recent Labs  Lab 08/25/17 1204 08/25/17 1231 08/25/17 1633 08/26/17 0539  LATICACIDVEN 2.2* 2.4* 0.5 1.0    Recent Results (from the past 240 hour(s))  Urine culture     Status: None   Collection Time: 08/25/17 10:31 AM  Result Value Ref Range Status   Specimen Description   Final    URINE, CLEAN CATCH Performed at [facility], [address] [place], [place] [address]    Special Requests   Final    NONE Performed at [facility], [address] [place], [place] [address]    Culture   Final    NO GROWTH Performed at [facility], [place] [address], [place], [place] [address]    Report Status 08/26/2017 FINAL  Final  MRSA PCR Screening     Status: None  Collection Time: 08/25/17  6:21 PM  Result Value Ref Range Status   MRSA by PCR NEGATIVE NEGATIVE Final    Comment:        The GeneXpert MRSA Assay (FDA approved for NASAL specimens only), is one component of a comprehensive MRSA colonization surveillance program. It is not intended to diagnose MRSA infection nor to guide or monitor treatment for MRSA infections. Performed at [facility], [address], [place], [place] [address]          Radiology Studies: Dg Chest 1 View  Result Date: 08/25/2017 CLINICAL DATA:  Confusion.  Found outside.  History of dementia. EXAM: CHEST  1 VIEW COMPARISON:  07/24/2014 FINDINGS: The cardiac silhouette is normal in size. The thoracic aorta is mildly tortuous with atherosclerotic calcification noted. No airspace consolidation, edema, pleural effusion, or pneumothorax is identified. A 1.7 cm ovoid density projecting below the right hemidiaphragm was not clearly present on the prior study and could reflect a calcified granuloma in the liver or posterior costophrenic sulcus. No acute osseous abnormality is seen. IMPRESSION: No active disease. Electronically Signed   By:  Logan Bores M.D.   On: 08/25/2017 11:25   Ct Head Wo Contrast  Result Date: 08/25/2017 CLINICAL DATA:  84 year old who was found outside at his house after laying on the ground all might. Hypothermia. EXAM: CT HEAD WITHOUT CONTRAST CT CERVICAL SPINE WITHOUT CONTRAST TECHNIQUE: Multidetector CT imaging of the head and cervical spine was performed following the standard protocol without intravenous contrast. Multiplanar CT image reconstructions of the cervical spine were also generated. COMPARISON:  MRI brain 08/21/2013.  CT head 08/20/2013. FINDINGS: CT HEAD FINDINGS Brain: Moderate to severe age related cortical and deep atrophy, progressive since 81. Moderate chronic microvascular ischemic changes of the white matter diffusely, unchanged. No mass lesion. No midline shift. No acute hemorrhage or hematoma. No extra-axial fluid collections. No evidence of acute infarction. Incidental note of partial empty sella. Vascular: Moderate BILATERAL carotid siphon and mild BILATERAL vertebral artery atherosclerosis. No hyperdense vessel. Skull: No skull fracture or other focal osseous abnormality involving the skull. Sinuses/Orbits: Small mucous retention cyst or polyp involving the MEDIAL wall of the LEFT maxillary sinus. Minimal mucosal thickening involving the base of the frontal sinuses bilaterally. Remaining visualized paranasal sinuses, BILATERAL mastoid air cells and BILATERAL middle ear cavities well-aerated. Visualized orbits and globes normal in appearance. Other: None. CT CERVICAL SPINE FINDINGS Alignment: Anatomic POSTERIOR alignment. Straightening of the usual lordosis. Skull base and vertebrae: No fractures identified involving the cervical spine. No evidence of perched facets. Diffuse facet degenerative changes. Coronal reformatted images demonstrate an intact craniocervical junction, intact dens and intact lateral masses throughout. Lucent foci in the tip of the dens and in the MEDIAL RIGHT base of the  dens likely represent degenerative cysts/geodes, as there are no lucent lesions elsewhere in the visualized skeleton. Soft tissues and spinal canal: No evidence of paraspinous or canal hematoma. Moderate to severe multifactorial spinal stenosis at C3-4 and mild multifactorial spinal stenosis at C5-6 and C6-7. Disc levels: Disc space narrowing and endplate hypertrophic changes at every cervical level and at C7-T1. Combination of uncinate and facet hypertrophy account for multilevel foraminal stenoses including: Mild BILATERAL C2-3, severe BILATERAL C3-4, moderate BILATERAL C4-5, severe BILATERAL C5-6, severe BILATERAL C6-7, moderate LEFT C7-T1. Upper chest: Visualized lung apices clear. Visualized superior mediastinum normal. Other: Mild atherosclerosis involving the LEFT carotid artery bulb. IMPRESSION: CT Head: 1. No acute intracranial abnormality. 2. Moderate to severe generalized atrophy and chronic  microvascular ischemic changes of the white matter diffusely. CT Cervical Spine: 1. No fractures identified involving the cervical spine. 2. Multilevel degenerative disc disease, spondylosis and facet degenerative changes resulting in multilevel foraminal stenoses as detailed above. 3. Moderate to severe multifactorial spinal stenosis at C3-4 and mild multifactorial spinal stenosis at C5-6 and C6-7. Electronically Signed   By: Evangeline Dakin M.D.   On: 08/25/2017 11:13   Ct Cervical Spine Wo Contrast  Result Date: 08/25/2017 CLINICAL DATA:  84 year old who was found outside at his house after laying on the ground all might. Hypothermia. EXAM: CT HEAD WITHOUT CONTRAST CT CERVICAL SPINE WITHOUT CONTRAST TECHNIQUE: Multidetector CT imaging of the head and cervical spine was performed following the standard protocol without intravenous contrast. Multiplanar CT image reconstructions of the cervical spine were also generated. COMPARISON:  MRI brain 08/21/2013.  CT head 08/20/2013. FINDINGS: CT HEAD FINDINGS Brain:  Moderate to severe age related cortical and deep atrophy, progressive since 20. Moderate chronic microvascular ischemic changes of the white matter diffusely, unchanged. No mass lesion. No midline shift. No acute hemorrhage or hematoma. No extra-axial fluid collections. No evidence of acute infarction. Incidental note of partial empty sella. Vascular: Moderate BILATERAL carotid siphon and mild BILATERAL vertebral artery atherosclerosis. No hyperdense vessel. Skull: No skull fracture or other focal osseous abnormality involving the skull. Sinuses/Orbits: Small mucous retention cyst or polyp involving the MEDIAL wall of the LEFT maxillary sinus. Minimal mucosal thickening involving the base of the frontal sinuses bilaterally. Remaining visualized paranasal sinuses, BILATERAL mastoid air cells and BILATERAL middle ear cavities well-aerated. Visualized orbits and globes normal in appearance. Other: None. CT CERVICAL SPINE FINDINGS Alignment: Anatomic POSTERIOR alignment. Straightening of the usual lordosis. Skull base and vertebrae: No fractures identified involving the cervical spine. No evidence of perched facets. Diffuse facet degenerative changes. Coronal reformatted images demonstrate an intact craniocervical junction, intact dens and intact lateral masses throughout. Lucent foci in the tip of the dens and in the MEDIAL RIGHT base of the dens likely represent degenerative cysts/geodes, as there are no lucent lesions elsewhere in the visualized skeleton. Soft tissues and spinal canal: No evidence of paraspinous or canal hematoma. Moderate to severe multifactorial spinal stenosis at C3-4 and mild multifactorial spinal stenosis at C5-6 and C6-7. Disc levels: Disc space narrowing and endplate hypertrophic changes at every cervical level and at C7-T1. Combination of uncinate and facet hypertrophy account for multilevel foraminal stenoses including: Mild BILATERAL C2-3, severe BILATERAL C3-4, moderate BILATERAL C4-5,  severe BILATERAL C5-6, severe BILATERAL C6-7, moderate LEFT C7-T1. Upper chest: Visualized lung apices clear. Visualized superior mediastinum normal. Other: Mild atherosclerosis involving the LEFT carotid artery bulb. IMPRESSION: CT Head: 1. No acute intracranial abnormality. 2. Moderate to severe generalized atrophy and chronic microvascular ischemic changes of the white matter diffusely. CT Cervical Spine: 1. No fractures identified involving the cervical spine. 2. Multilevel degenerative disc disease, spondylosis and facet degenerative changes resulting in multilevel foraminal stenoses as detailed above. 3. Moderate to severe multifactorial spinal stenosis at C3-4 and mild multifactorial spinal stenosis at C5-6 and C6-7. Electronically Signed   By: Evangeline Dakin M.D.   On: 08/25/2017 11:13   US Abdomen Limited  Result Date: 08/25/2017 CLINICAL DATA:  Elevated liver function tests. EXAM: ULTRASOUND ABDOMEN LIMITED RIGHT UPPER QUADRANT COMPARISON:  05/11/2006 CT abdomen/pelvis. FINDINGS: Gallbladder: Gallbladder is mildly distended. There are two nonmobile faintly shadowing gallstones in the gallbladder neck measuring up to 7 mm. No gallbladder wall thickening. No pericholecystic fluid. By report, technologist is  unable to assess for sonographic Murphy sign given history of dementia. Common bile duct: Diameter: 4 mm Liver: Simple 1.9 cm anterior left liver lobe cyst. No additional focal liver lesions. Liver parenchymal echogenicity and echotexture are within normal limits. Portal vein is patent on color Doppler imaging with normal direction of blood flow towards the liver. IMPRESSION: 1. Cholelithiasis. Mildly distended gallbladder. No gallbladder wall thickening. No pericholecystic fluid. Unable to assess for sonographic Murphy sign given patient's mental status. If there is clinical concern for acute cholecystitis, consider hepatobiliary scintigraphy study. 2. No biliary ductal dilatation. 3. Small simple  left liver lobe cyst.  No suspicious liver lesions. Electronically Signed   By: Ilona Sorrel M.D.   On: 08/25/2017 13:54   Dg Hips Bilat With Pelvis Min 5 Views  Result Date: 08/25/2017 CLINICAL DATA:  CONFUSION, PT WAS FOUND OUTSIDE BY BROTHER THIS AM, DEMENTIA, HTN, NON SMOKER, PT WAS UNABLE TO GIVE ANY OTHER INFORMATION EXAM: DG HIP (WITH OR WITHOUT PELVIS) 5+V BILAT COMPARISON:  None. FINDINGS: No fracture or dislocation.  No bone lesion. There is left greater than right concentric hip joint space narrowing with a greater degree of superolateral hip joint space narrowing on the left. Mild subchondral cystic change along the superolateral acetabula. SI joints and symphysis pubis are normally spaced and aligned. Bones are demineralized. Soft tissues are unremarkable. IMPRESSION: 1. No fracture, dislocation or acute finding. Electronically Signed   By: Lajean Manes M.D.   On: 08/25/2017 11:24        Scheduled Meds: . insulin aspart  0-15 Units Subcutaneous Q4H   Continuous Infusions: . dextrose 5 % and 0.2 % NaCl       LOS: 2 days    Time spent: 30 minutes    Aras Albarran Darleen Crocker, DO [facility] Pager [phone]  If 7PM-7AM, please contact night-coverage [email or web address] Password [facility] 08/27/2017, 10:22 AM

## 2017-08-27 NOTE — Plan of Care (Signed)
Pt is confused 

## 2017-08-27 NOTE — Evaluation (Signed)
Clinical/Bedside Swallow Evaluation Patient Details  Name: Paul Perez MRN: [id number] Date of Birth: 10/17/1927  Today's Date: 08/27/2017 Time: SLP Start Time (ACUTE ONLY): 6203 SLP Stop Time (ACUTE ONLY): 1247 SLP Time Calculation (min) (ACUTE ONLY): 24 min  Past Medical History:  Past Medical History:  Diagnosis Date  . Dementia   . Diabetes mellitus without complication ([place])   . Encephalopathy   . Gastritis   . GIB (gastrointestinal bleeding)   . Heart problem   . Hypertension   . Poor historian    Past Surgical History:  Past Surgical History:  Procedure Laterality Date  . INGUINAL HERNIA REPAIR  09/2010   Bilateral   HPI:  Paul Perez is a 84 y.o. male with medical history significant for diabetes mellitus, dementia and hypertension who was last seen in the night before on his riding mower mowing the yard.  He was reportedly found by his brother on the ground and the patient was noted to be shaking with complaints of pain.  He is brought to the emergency department with hypothermia and is admitted with rhabdomyolysis along with AK I on CKD stage III secondarily.  He has been placed on aggressive IV fluid hydration with improvement in rhabdomyolysis noted thus far.   Assessment / Plan / Recommendation Clinical Impression  Clinical swallow evaluation completed with Pt alert and upright in bed and son at bedside. Oral motor evaluation completed and revealed dry oral cavity with dried secretions. Oral care provided, which improved xerostomia. Pt presents with primarily cognitive based dysphagia at this time, characterized by reduced awareness to task but good response to verbal cues, prolonged mastication, and reduced lingual movement resulting in prolonged oral phase and lingual residuals with solids. Pt benefited from verbal cues and physical prompts with self feeding. Recommend D3/mech soft with thin liquids with supervision for meals at this time. Above discussed with  RN. Pt left in room with lunch meal and son present to help with self feeding/supervision. SLP will follow during acute stay.   SLP Visit Diagnosis: Dysphagia, unspecified (R13.10)    Aspiration Risk  Mild aspiration risk    Diet Recommendation Dysphagia 3 (Mech soft);Thin liquid   Liquid Administration via: Cup;Straw Medication Administration: Whole meds with liquid Supervision: Patient able to self feed;Full supervision/cueing for compensatory strategies;Staff to assist with self feeding Compensations: Slow rate;Small sips/bites;Lingual sweep for clearance of pocketing Postural Changes: Seated upright at 90 degrees;Remain upright for at least 30 minutes after po intake    Other  Recommendations Oral Care Recommendations: Oral care BID;Oral care before and after PO;Staff/trained caregiver to provide oral care Other Recommendations: Clarify dietary restrictions   Follow up Recommendations 24 hour supervision/assistance      Frequency and Duration min 2x/week  1 week       Prognosis Prognosis for Safe Diet Advancement: Fair Barriers to Reach Goals: Cognitive deficits      Swallow Study   General Date of Onset: 08/25/17 HPI: Paul Perez is a 84 y.o. male with medical history significant for diabetes mellitus, dementia and hypertension who was last seen in the night before on his riding mower mowing the yard.  He was reportedly found by his brother on the ground and the patient was noted to be shaking with complaints of pain.  He is brought to the emergency department with hypothermia and is admitted with rhabdomyolysis along with AK I on CKD stage III secondarily.  He has been placed on aggressive IV fluid hydration with improvement  in rhabdomyolysis noted thus far. Type of Study: Bedside Swallow Evaluation Previous Swallow Assessment: BSE several years ago Diet Prior to this Study: Dysphagia 3 (soft);Thin liquids Temperature Spikes Noted: No Respiratory Status: Nasal  cannula History of Recent Intubation: No Behavior/Cognition: Alert;Cooperative;Confused Oral Cavity Assessment: Dry;Dried secretions Oral Care Completed by SLP: Yes Oral Cavity - Dentition: Poor condition Vision: Functional for self-feeding Self-Feeding Abilities: Able to feed self;Needs set up Patient Positioning: Upright in bed Baseline Vocal Quality: Normal Volitional Cough: Strong Volitional Swallow: Able to elicit    Oral/Motor/Sensory Function Overall Oral Motor/Sensory Function: Within functional limits   Ice Chips Ice chips: Within functional limits Presentation: Spoon   Thin Liquid Thin Liquid: Within functional limits Presentation: Cup;Straw;Self Fed    Nectar Thick Nectar Thick Liquid: Not tested   Honey Thick Honey Thick Liquid: Not tested   Puree Puree: Within functional limits Presentation: Spoon   Solid   GO   Solid: Impaired Presentation: Spoon Oral Phase Impairments: Reduced lingual movement/coordination;Impaired mastication Oral Phase Functional Implications: Prolonged oral transit       Thank you,  Genene Churn, [place] 08/27/2017,12:49 PM

## 2017-08-28 ENCOUNTER — Encounter (HOSPITAL_COMMUNITY): Payer: Self-pay | Admitting: Physician Assistant

## 2017-08-28 DIAGNOSIS — R001 Bradycardia, unspecified: Secondary | ICD-10-CM

## 2017-08-28 LAB — COMPREHENSIVE METABOLIC PANEL
ALK PHOS: 44 U/L (ref 38–126)
ALT: 64 U/L — AB (ref 17–63)
AST: 104 U/L — AB (ref 15–41)
Albumin: 2.6 g/dL — ABNORMAL LOW (ref 3.5–5.0)
Anion gap: 7 (ref 5–15)
BUN: 67 mg/dL — AB (ref 6–20)
CALCIUM: 8.1 mg/dL — AB (ref 8.9–10.3)
CHLORIDE: 121 mmol/L — AB (ref 101–111)
CO2: 18 mmol/L — ABNORMAL LOW (ref 22–32)
CREATININE: 2.07 mg/dL — AB (ref 0.61–1.24)
GFR calc Af Amer: 31 mL/min — ABNORMAL LOW (ref >60)
GFR calc non Af Amer: 27 mL/min — ABNORMAL LOW (ref >60)
GLUCOSE: 152 mg/dL — AB (ref 65–99)
Potassium: 4.3 mmol/L (ref 3.5–5.1)
SODIUM: 146 mmol/L — AB (ref 135–145)
Total Bilirubin: 0.5 mg/dL (ref 0.3–1.2)
Total Protein: 5.3 g/dL — ABNORMAL LOW (ref 6.5–8.1)

## 2017-08-28 LAB — GLUCOSE, CAPILLARY
GLUCOSE-CAPILLARY: 137 mg/dL — AB (ref 65–99)
GLUCOSE-CAPILLARY: 111 mg/dL — AB (ref 65–99)
GLUCOSE-CAPILLARY: 103 mg/dL — AB (ref 65–99)
Glucose-Capillary: 110 mg/dL — ABNORMAL HIGH (ref 65–99)
Glucose-Capillary: 110 mg/dL — ABNORMAL HIGH (ref 65–99)
Glucose-Capillary: 130 mg/dL — ABNORMAL HIGH (ref 65–99)
Glucose-Capillary: 169 mg/dL — ABNORMAL HIGH (ref 65–99)

## 2017-08-28 LAB — CBC
HEMATOCRIT: 28.9 % — AB (ref 39.0–52.0)
Hemoglobin: 8.6 g/dL — ABNORMAL LOW (ref 13.0–17.0)
MCH: 27 pg (ref 26.0–34.0)
MCHC: 29.8 g/dL — ABNORMAL LOW (ref 30.0–36.0)
MCV: 90.6 fL (ref 78.0–100.0)
PLATELETS: 105 10*3/uL — AB (ref 150–400)
RBC: 3.19 MIL/uL — ABNORMAL LOW (ref 4.22–5.81)
RDW: 14.6 % (ref 11.5–15.5)
WBC: 5.4 10*3/uL (ref 4.0–10.5)

## 2017-08-28 LAB — CK: CK TOTAL: 6136 U/L — AB (ref 49–397)

## 2017-08-28 MED ORDER — DEXTROSE 5 % IV SOLN: INTRAVENOUS | Status: DC

## 2017-08-28 MED ORDER — DEXTROSE-NACL 5-0.2 % IV SOLN
INTRAVENOUS | Status: DC
  Administered 2017-08-28 – 2017-08-29 (×4): via INTRAVENOUS

## 2017-08-28 NOTE — Progress Notes (Signed)
PROGRESS NOTE    Paul Perez  FGH:[id number] DOB: December 18, 1927 DOA: 08/25/2017 PCP: Jani Gravel, MD   Brief Narrative:   Paul Perez is a 84 y.o. male with medical history significant for diabetes mellitus, dementia and hypertension who was last seen in the night before on his riding mower mowing the yard.  He was reportedly found by his brother on the ground and the patient was noted to be shaking with complaints of pain.  He is brought to the emergency department with hypothermia and is admitted with rhabdomyolysis along with AK I on CKD stage III secondarily.  He has been placed on aggressive IV fluid hydration with improvement in rhabdomyolysis noted thus far.   Assessment & Plan:   Principal Problem:   Rhabdomyolysis Active Problems:   Acute encephalopathy   Hypertension   AKI (acute kidney injury) (HCC)   Bradycardia   Lactic acidosis   Elevated troponin   Acute hypernatremia   Hyperlipidemia   Diabetes mellitus type 2, uncontrolled, with complications (HCC)   Hypothermia   1. Acute encephalopathy-multifactorial in setting of senile dementia with behavioral disturbances.  This appears to be improved. 2. Hypertension.  Continue to monitor closely.  Improved with restart of home amlodipine yesterday. 3. AK I on CKD stage III.  Continue improvement noted while on IV fluid.  This appears to be secondary to rhabdomyolysis.  Continue to monitor with repeat a.m. labs and avoid nephrotoxic agents. 4. Rhabdomyolysis with associated troponin elevation.  Both CK and troponin levels are currently downtrending and it is felt that troponin elevation is not related to ACS but rather the rhabdomyolysis.  Cardiology evaluation appreciated-no plans for ischemic workup at this time.  2D echocardiogram with no significant findings aside from grade 1 diastolic dysfunction.  EF 60-65%. 5. Hypernatremia and hyperchloremia.  This has failed to improve with half normal saline and therefore  will switch to D5 quarter normal saline with close monitoring of blood glucose levels.  Blood glucose levels have remained well controlled, and therefore will increase from 75->125 cc/h. 6. Dyslipidemia.  Continue to hold statin in face of acute rhabdomyolysis. 7. Hypothermia with associated bradycardia.  Improved core body temperature and heart rate noted at this time. 8. Type 2 diabetes-uncontrolled.  Sliding scale insulin adjusted to every 4 hours due to D5 infusion.  Diet advanced to soft.   DVT prophylaxis: SCDs Code Status: Full code Family Communication: None currently at bedside Disposition Plan: Continue aggressive IV fluid hydration in stepdown unit with close monitoring. Possible DC in AM.   Consultants:   Cardiology  Procedures:   None  Antimicrobials:   None   Subjective: Patient seen and evaluated today with no acute complaints or concerns noted.  He would like to have his maintenance taken off of possible.  Spoke with family members at bedside two days ago who plan to have caregivers at home set up when he is ready for discharge.  Objective: Vitals:   08/27/17 0535 08/27/17 1312 08/27/17 2111 08/28/17 0534  BP: (!) 143/64 (!) 154/54 (!) 173/53 (!) 165/46  Pulse: (!) 48 (!) 55 82 62  Resp: 18 16 16 18   Temp: 97.7 F (36.5 C) 98.5 F (36.9 C) 97.7 F (36.5 C) (!) 97.5 F (36.4 C)  TempSrc: Oral Oral Oral Oral  SpO2: 99% 100% 98% 98%  Weight:      Height:        Intake/Output Summary (Last 24 hours) at 08/28/2017 1037 Last data filed at 08/28/2017  0900 Gross per 24 hour  Intake 1432.5 ml  Output 2650 ml  Net -1217.5 ml   Filed Weights   08/25/17 1838 08/26/17 0408  Weight: 84.6 kg (186 lb 8.2 oz) 84.7 kg (186 lb 11.7 oz)    Examination:  General exam: Appears calm and comfortable  Respiratory system: Clear to auscultation. Respiratory effort normal. Cardiovascular system: S1 & S2 heard, RRR. No JVD, murmurs, rubs, gallops or clicks. No pedal  edema. Gastrointestinal system: Abdomen is nondistended, soft and nontender. No organomegaly or masses felt. Normal bowel sounds heard. Central nervous system: Alert and oriented. No focal neurological deficits. Extremities: Symmetric 5 x 5 power. Skin: No rashes, lesions or ulcers Psychiatry: Judgement and insight appear normal. Mood & affect appropriate.     Data Reviewed: I have personally reviewed following labs and imaging studies  CBC: Recent Labs  Lab 08/25/17 1204 08/27/17 0607  WBC 11.6* 5.6  NEUTROABS 10.2*  --   HGB 12.1* 8.9*  HCT 39.4 29.3*  MCV 88.3 88.8  PLT 216 324*   Basic Metabolic Panel: Recent Labs  Lab 08/25/17 1204 08/26/17 0539 08/27/17 0607 08/28/17 0634  NA 146* 147* 149* 146*  K 5.6* 5.0 4.6 4.3  CL 111 120* 124* 121*  CO2 15* 14* 12* 18*  GLUCOSE 178* 153* 138* 152*  BUN 89* 96* 92* 67*  CREATININE 2.79* 2.73* 2.63* 2.07*  CALCIUM 9.3 8.3* 8.3* 8.1*   GFR: Estimated Creatinine Clearance: 25.5 mL/min (A) (by C-G formula based on SCr of 2.07 mg/dL (H)). Liver Function Tests: Recent Labs  Lab 08/25/17 1204 08/28/17 0634  AST 355* 104*  ALT 101* 64*  ALKPHOS 70 44  BILITOT 0.9 0.5  PROT 7.4 5.3*  ALBUMIN 3.8 2.6*   No results for input(s): LIPASE, AMYLASE in the last 168 hours. No results for input(s): AMMONIA in the last 168 hours. Coagulation Profile: Recent Labs  Lab 08/25/17 1204  INR 1.13   Cardiac Enzymes: Recent Labs  Lab 08/25/17 1204 08/25/17 1444 08/25/17 2207 08/26/17 0539 08/27/17 0607 08/28/17 0634  CKTOTAL 40,102*  --   --  13,395* 8,539* 6,136*  TROPONINI 9.31* 8.03* 6.94* 4.45*  --   --    BNP (last 3 results) No results for input(s): PROBNP in the last 8760 hours. HbA1C: Recent Labs    08/25/17 1602  HGBA1C 6.4*   CBG: Recent Labs  Lab 08/27/17 1624 08/27/17 2050 08/28/17 0009 08/28/17 0405 08/28/17 0710  GLUCAP 126* 164* 110* 111* 130*   Lipid Profile: No results for input(s): CHOL,  HDL, LDLCALC, TRIG, CHOLHDL, LDLDIRECT in the last 72 hours. Thyroid Function Tests: Recent Labs    08/25/17 1602  TSH 1.690   Anemia Panel: No results for input(s): VITAMINB12, FOLATE, FERRITIN, TIBC, IRON, RETICCTPCT in the last 72 hours. Sepsis Labs: Recent Labs  Lab 08/25/17 1204 08/25/17 1231 08/25/17 1633 08/26/17 0539  LATICACIDVEN 2.2* 2.4* 0.5 1.0    Recent Results (from the past 240 hour(s))  Urine culture     Status: None   Collection Time: 08/25/17 10:31 AM  Result Value Ref Range Status   Specimen Description   Final    URINE, CLEAN CATCH Performed at [facility], [address] [place], [place] [address]    Special Requests   Final    NONE Performed at [facility], [address] [place], [place] [address]    Culture   Final    NO GROWTH Performed at [facility], [place] [address],  [place], [place] [address]    Report Status 08/26/2017 FINAL  Final  MRSA PCR Screening     Status: None   Collection Time: 08/25/17  6:21 PM  Result Value Ref Range Status   MRSA by PCR NEGATIVE NEGATIVE Final    Comment:        The GeneXpert MRSA Assay (FDA approved for NASAL specimens only), is one component of a comprehensive MRSA colonization surveillance program. It is not intended to diagnose MRSA infection nor to guide or monitor treatment for MRSA infections. Performed at [facility], [address], [place], [place] [address]          Radiology Studies: No results found.      Scheduled Meds: . amLODipine  5 mg Oral Daily  . insulin aspart  0-15 Units Subcutaneous Q4H   Continuous Infusions: . dextrose 5 % and 0.2 % NaCl 75 mL/hr at 08/28/17 0559     LOS: 3 days    Time spent: 30 minutes    Dimitris Shanahan Darleen Crocker, DO [facility] Pager [phone]  If 7PM-7AM, please contact night-coverage [email or web address] Password [facility] 08/28/2017, 10:37 AM

## 2017-08-28 NOTE — Care Management (Signed)
Family requesting to speak with CM. Family not in room. CM called daughter and left nondescript VM.

## 2017-08-28 NOTE — Progress Notes (Signed)
  Speech Language Pathology Treatment: Dysphagia  Patient Details Name: Paul Perez MRN: [id number] DOB: May 21, 1927 Today's Date: 08/28/2017 Time: 7517-0017 SLP Time Calculation (min) (ACUTE ONLY): 24 min  Assessment / Plan / Recommendation Clinical Impression  Pt seen for ongoing dysphagia intervention following BSE completed yesterday. Nursing staff reports that Pt consumed ~50% of his lunch meal with feeder assist, but needed encouragement for intake. Pt tells SLP that he's not feeling well today, but unable to identify why. Pt repositioned in bed to upright and agreeable to a "diet Coke". Pt exhibited mild oral holding with straw sips soda and required verbal cues to "swallow", however no overt signs/symptoms of decreased airway protection. Pt politely refused solid foods and stated he didn't want anything. Recommend continuing diet as ordered with SLP to follow during acute stay.    HPI HPI: Paul Perez is a 84 y.o. male with medical history significant for diabetes mellitus, dementia and hypertension who was last seen in the night before on his riding mower mowing the yard.  He was reportedly found by his brother on the ground and the patient was noted to be shaking with complaints of pain.  He is brought to the emergency department with hypothermia and is admitted with rhabdomyolysis along with AK I on CKD stage III secondarily.  He has been placed on aggressive IV fluid hydration with improvement in rhabdomyolysis noted thus far.      SLP Plan  Continue with current plan of care       Recommendations  Diet recommendations: Dysphagia 3 (mechanical soft);Thin liquid Liquids provided via: Cup;Straw Medication Administration: Whole meds with liquid Supervision: Staff to assist with self feeding;Full supervision/cueing for compensatory strategies Compensations: Slow rate;Small sips/bites;Lingual sweep for clearance of pocketing Postural Changes and/or Swallow Maneuvers: Seated  upright 90 degrees;Upright 30-60 min after meal                Oral Care Recommendations: Oral care BID;Oral care before and after PO;Staff/trained caregiver to provide oral care Follow up Recommendations: 24 hour supervision/assistance SLP Visit Diagnosis: Dysphagia, unspecified (R13.10) Plan: Continue with current plan of care       Thank you,  Genene Churn, [place]                 [place] 08/28/2017, 4:41 PM

## 2017-08-28 NOTE — Consult Note (Addendum)
Cardiology Consultation:   Patient ID: BASIR NIVEN; [id number]; 11-22-1927   Admit date: 08/25/2017 Date of Consult: 08/28/2017  Primary Care Provider: Jani Gravel, MD Primary Cardiologist: Dr Domenic Polite, 08/28/2013 Primary Electrophysiologist: n/a   Patient Profile:   DILYN SMILES is a 84 y.o. male with a hx of bradycardia in the setting of rhabdo 2015, dementia, DM, HTN, UGIB 2nd H pylori, who is being seen today for the evaluation of elevated troponin and bradycardia at the request of Dr Maryland Pink.  History of Present Illness:   Mr. Lease was admitted 04/12 after he was found down by his brother, having fallen and laid in the yard all night. He had been mowing with a riding mower. The mower was found in the woods.  Lactic acid 2.2, Na 146, K+ 5.6, Cr 2.79 (baseline unknown), glucose 178, CK 22,000 w/ trop 9.31 (phone discussion w/ cards>>trop likely not cardiac). Core body temp 94.6>>Bair hugger.  Troponin and CK have both been trending down. MB fraction not obtained. He is still being hydrated. Cr trending down as well.   Mr Stepanek is pleasant but confused. No family is present. Oriented to name and hospital, does not know the city. Cannot remember the events that got him here.  He feels generally achy, he feel this way a lot. He is not complaining of any specific pain. He does not remember having any chest pain or abdominal pain.   He never gets light-headed or dizzy. He never feels his heart skip or race. He has never passed out that he can recall. He does not normally fall. No specific complaints right now.   Past Medical History:  Diagnosis Date  . Dementia   . Diabetes mellitus without complication ([place])   . Encephalopathy   . Gastritis 2004  . GIB (gastrointestinal bleeding) 2004  . Hypertension   . Poor historian   . Sinus bradycardia seen on cardiac monitor     Past Surgical History:  Procedure Laterality Date  . INGUINAL HERNIA REPAIR  09/2010   Bilateral  . UPPER GASTROINTESTINAL ENDOSCOPY  2004     Prior to Admission medications   Medication Sig Start Date End Date Taking? Authorizing Provider  amLODipine (NORVASC) 5 MG tablet Take 5 mg by mouth daily.   Yes [provider]  lisinopril (PRINIVIL,ZESTRIL) 30 MG tablet Take 30 mg by mouth daily.    Yes [provider]  meclizine (ANTIVERT) 25 MG tablet Take 25 mg by mouth 3 (three) times daily as needed for dizziness.   Yes [provider]  metFORMIN (GLUCOPHAGE) 500 MG tablet Take 1 tablet by mouth 2 (two) times daily. 11/25/16  Yes [provider]  pioglitazone (ACTOS) 15 MG tablet Take 1 tablet by mouth daily. 12/01/16  Yes [provider]  simvastatin (ZOCOR) 40 MG tablet Take 40 mg by mouth daily.   Yes [provider]  triamterene-hydrochlorothiazide (MAXZIDE-25) 37.5-25 MG per tablet Take 1 tablet by mouth daily.   Yes [provider]  zolpidem (AMBIEN) 10 MG tablet Take 10 mg by mouth at bedtime as needed for sleep.   Yes [provider]    Inpatient Medications: Scheduled Meds: . amLODipine  5 mg Oral Daily  . insulin aspart  0-15 Units Subcutaneous Q4H   Continuous Infusions: . dextrose 5 % and 0.2 % NaCl 75 mL/hr at 08/28/17 0559   PRN Meds:  Allergies:   No Known Allergies  Social History:   Social History   Socioeconomic History  .  Marital status: Widowed    Spouse name: Not on file  . Number of children: Not on file  . Years of education: Not on file  . Highest education level: Not on file  Occupational History  . Not on file  Social Needs  . Financial resource strain: Not on file  . Food insecurity:    Worry: Not on file    Inability: Not on file  . Transportation needs:    Medical: Not on file    Non-medical: Not on file  Tobacco Use  . Smoking status: Never Smoker  . Smokeless tobacco: Never Used  Substance and Sexual Activity  . Alcohol use: No  . Drug use: No  .  Sexual activity: Not on file  Lifestyle  . Physical activity:    Days per week: Not on file    Minutes per session: Not on file  . Stress: Not on file  Relationships  . Social connections:    Talks on phone: Not on file    Gets together: Not on file    Attends religious service: Not on file    Active member of club or organization: Not on file    Attends meetings of clubs or organizations: Not on file    Relationship status: Not on file  . Intimate partner violence:    Fear of current or ex partner: Not on file    Emotionally abused: Not on file    Physically abused: Not on file    Forced sexual activity: Not on file  Other Topics Concern  . Not on file  Social History Narrative  . Not on file    Family History:   Family History  Problem Relation Age of Onset  . Diabetes Mother   . Diabetes Sister    Family Status:  Family Status  Relation Name Status  . Mother  Deceased  . Father  Deceased  . Brother  Alive  . Sister  (Not Specified)    ROS:  Please see the history of present illness.  No other information available due to pt condition.   Physical Exam/Data:   Vitals:   08/27/17 0535 08/27/17 1312 08/27/17 2111 08/28/17 0534  BP: (!) 143/64 (!) 154/54 (!) 173/53 (!) 165/46  Pulse: (!) 48 (!) 55 82 62  Resp: 18 16 16 18   Temp: 97.7 F (36.5 C) 98.5 F (36.9 C) 97.7 F (36.5 C) (!) 97.5 F (36.4 C)  TempSrc: Oral Oral Oral Oral  SpO2: 99% 100% 98% 98%  Weight:      Height:        Intake/Output Summary (Last 24 hours) at 08/28/2017 0844 Last data filed at 08/28/2017 0535 Gross per 24 hour  Intake 1192.5 ml  Output 2650 ml  Net -1457.5 ml   Filed Weights   08/25/17 1838 08/26/17 0408  Weight: 186 lb 8.2 oz (84.6 kg) 186 lb 11.7 oz (84.7 kg)   Body mass index is 26.79 kg/m.  General:  Well nourished, well developed, in no acute distress HEENT: normal Lymph: no adenopathy Neck: no JVD Endocrine:  No thryomegaly Vascular: No carotid bruits; 4/4  extremity pulses 2+, without bruits  Cardiac:  normal S1, S2; RRR; no murmur  Lungs:  Exp wheeze bilaterally, no rhonchi or rales  Abd: soft, nontender, no hepatomegaly  Ext: no edema Musculoskeletal:  No deformities, BUE and BLE strength normal and equal Skin: warm and dry  Neuro:  CNs 2-12 intact, no focal abnormalities noted Psych:  Normal  affect   EKG:  The EKG was personally reviewed and demonstrates:  ST, HR 109, PACs and PVCs Telemetry:  Telemetry was personally reviewed and demonstrates:  SB to SR   Upper 30s to 80s   SOme atrial bigeminy   NO significant pauses  Relevant CV Studies:  ECHO: 08/26/2017 - Left ventricle: The cavity size was normal. There was mild   concentric hypertrophy with moderate hypertrophy of the basal   septum. Systolic function was normal. The estimated ejection   fraction was in the range of 60% to 65%. Wall motion was normal;   there were no regional wall motion abnormalities. Doppler   parameters are consistent with abnormal left ventricular   relaxation (grade 1 diastolic dysfunction). Doppler parameters   are consistent with indeterminate ventricular filling pressure. - Aortic valve: Transvalvular velocity was within the normal range.   There was no stenosis. There was no regurgitation. - Mitral valve: Transvalvular velocity was within the normal range.   There was no evidence for stenosis. There was mild regurgitation. - Right ventricle: The cavity size was normal. Wall thickness was   normal. Systolic function was normal. - Atrial septum: No defect or patent foramen ovale was identified   by color flow Doppler. - Tricuspid valve: There was trivial regurgitation. - Pulmonary arteries: Systolic pressure was within the normal   range.   Laboratory Data:  Chemistry Recent Labs  Lab 08/26/17 0539 08/27/17 0607 08/28/17 0634  NA 147* 149* 146*  K 5.0 4.6 4.3  CL 120* 124* 121*  CO2 14* 12* 18*  GLUCOSE 153* 138* 152*  BUN 96* 92* 67*    CREATININE 2.73* 2.63* 2.07*  CALCIUM 8.3* 8.3* 8.1*  GFRNONAA 19* 20* 27*  GFRAA 22* 23* 31*  ANIONGAP 13 13 7     Lab Results  Component Value Date   ALT 64 (H) 08/28/2017   AST 104 (H) 08/28/2017   ALKPHOS 44 08/28/2017   BILITOT 0.5 08/28/2017   Hematology Recent Labs  Lab 08/25/17 1204 08/27/17 0607  WBC 11.6* 5.6  RBC 4.46 3.30*  HGB 12.1* 8.9*  HCT 39.4 29.3*  MCV 88.3 88.8  MCH 27.1 27.0  MCHC 30.7 30.4  RDW 14.5 14.8  PLT 216 114*   Cardiac Enzymes Recent Labs  Lab 08/25/17 1204 08/25/17 1444 08/25/17 2207 08/26/17 0539  TROPONINI 9.31* 8.03* 6.94* 4.45*   Recent Labs  Lab 08/25/17 1204 08/26/17 0539 08/27/17 0607 08/28/17 0634  CKTOTAL 21,892* 13,395* 8,539* 6,136*     TSH:  Lab Results  Component Value Date   TSH 1.690 08/25/2017   Lipids: Lab Results  Component Value Date   CHOL 250 (H) 08/21/2013   HDL 69 08/21/2013   LDLCALC 169 (H) 08/21/2013   TRIG 59 08/21/2013   CHOLHDL 3.6 08/21/2013   HgbA1c: Lab Results  Component Value Date   HGBA1C 6.4 (H) 08/25/2017   Magnesium:  Magnesium  Date Value Ref Range Status  12/27/2016 3.4 (H) 1.7 - 2.4 mg/dL Final     Radiology/Studies:  Dg Chest 1 View  Result Date: 08/25/2017 CLINICAL DATA:  Confusion.  Found outside.  History of dementia. EXAM: CHEST  1 VIEW COMPARISON:  07/24/2014 FINDINGS: The cardiac silhouette is normal in size. The thoracic aorta is mildly tortuous with atherosclerotic calcification noted. No airspace consolidation, edema, pleural effusion, or pneumothorax is identified. A 1.7 cm ovoid density projecting below the right hemidiaphragm was not clearly present on the prior study and could reflect a calcified granuloma in  the liver or posterior costophrenic sulcus. No acute osseous abnormality is seen. IMPRESSION: No active disease. Electronically Signed   By: Logan Bores M.D.   On: 08/25/2017 11:25   Ct Head Wo Contrast  Result Date: 08/25/2017 CLINICAL DATA:   84 year old who was found outside at his house after laying on the ground all might. Hypothermia. EXAM: CT HEAD WITHOUT CONTRAST CT CERVICAL SPINE WITHOUT CONTRAST TECHNIQUE: Multidetector CT imaging of the head and cervical spine was performed following the standard protocol without intravenous contrast. Multiplanar CT image reconstructions of the cervical spine were also generated. COMPARISON:  MRI brain 08/21/2013.  CT head 08/20/2013. FINDINGS: CT HEAD FINDINGS Brain: Moderate to severe age related cortical and deep atrophy, progressive since 17. Moderate chronic microvascular ischemic changes of the white matter diffusely, unchanged. No mass lesion. No midline shift. No acute hemorrhage or hematoma. No extra-axial fluid collections. No evidence of acute infarction. Incidental note of partial empty sella. Vascular: Moderate BILATERAL carotid siphon and mild BILATERAL vertebral artery atherosclerosis. No hyperdense vessel. Skull: No skull fracture or other focal osseous abnormality involving the skull. Sinuses/Orbits: Small mucous retention cyst or polyp involving the MEDIAL wall of the LEFT maxillary sinus. Minimal mucosal thickening involving the base of the frontal sinuses bilaterally. Remaining visualized paranasal sinuses, BILATERAL mastoid air cells and BILATERAL middle ear cavities well-aerated. Visualized orbits and globes normal in appearance. Other: None. CT CERVICAL SPINE FINDINGS Alignment: Anatomic POSTERIOR alignment. Straightening of the usual lordosis. Skull base and vertebrae: No fractures identified involving the cervical spine. No evidence of perched facets. Diffuse facet degenerative changes. Coronal reformatted images demonstrate an intact craniocervical junction, intact dens and intact lateral masses throughout. Lucent foci in the tip of the dens and in the MEDIAL RIGHT base of the dens likely represent degenerative cysts/geodes, as there are no lucent lesions elsewhere in the visualized  skeleton. Soft tissues and spinal canal: No evidence of paraspinous or canal hematoma. Moderate to severe multifactorial spinal stenosis at C3-4 and mild multifactorial spinal stenosis at C5-6 and C6-7. Disc levels: Disc space narrowing and endplate hypertrophic changes at every cervical level and at C7-T1. Combination of uncinate and facet hypertrophy account for multilevel foraminal stenoses including: Mild BILATERAL C2-3, severe BILATERAL C3-4, moderate BILATERAL C4-5, severe BILATERAL C5-6, severe BILATERAL C6-7, moderate LEFT C7-T1. Upper chest: Visualized lung apices clear. Visualized superior mediastinum normal. Other: Mild atherosclerosis involving the LEFT carotid artery bulb. IMPRESSION: CT Head: 1. No acute intracranial abnormality. 2. Moderate to severe generalized atrophy and chronic microvascular ischemic changes of the white matter diffusely. CT Cervical Spine: 1. No fractures identified involving the cervical spine. 2. Multilevel degenerative disc disease, spondylosis and facet degenerative changes resulting in multilevel foraminal stenoses as detailed above. 3. Moderate to severe multifactorial spinal stenosis at C3-4 and mild multifactorial spinal stenosis at C5-6 and C6-7. Electronically Signed   By: Evangeline Dakin M.D.   On: 08/25/2017 11:13   Ct Cervical Spine Wo Contrast  Result Date: 08/25/2017 CLINICAL DATA:  84 year old who was found outside at his house after laying on the ground all might. Hypothermia. EXAM: CT HEAD WITHOUT CONTRAST CT CERVICAL SPINE WITHOUT CONTRAST TECHNIQUE: Multidetector CT imaging of the head and cervical spine was performed following the standard protocol without intravenous contrast. Multiplanar CT image reconstructions of the cervical spine were also generated. COMPARISON:  MRI brain 08/21/2013.  CT head 08/20/2013. FINDINGS: CT HEAD FINDINGS Brain: Moderate to severe age related cortical and deep atrophy, progressive since 59. Moderate chronic  microvascular ischemic  changes of the white matter diffusely, unchanged. No mass lesion. No midline shift. No acute hemorrhage or hematoma. No extra-axial fluid collections. No evidence of acute infarction. Incidental note of partial empty sella. Vascular: Moderate BILATERAL carotid siphon and mild BILATERAL vertebral artery atherosclerosis. No hyperdense vessel. Skull: No skull fracture or other focal osseous abnormality involving the skull. Sinuses/Orbits: Small mucous retention cyst or polyp involving the MEDIAL wall of the LEFT maxillary sinus. Minimal mucosal thickening involving the base of the frontal sinuses bilaterally. Remaining visualized paranasal sinuses, BILATERAL mastoid air cells and BILATERAL middle ear cavities well-aerated. Visualized orbits and globes normal in appearance. Other: None. CT CERVICAL SPINE FINDINGS Alignment: Anatomic POSTERIOR alignment. Straightening of the usual lordosis. Skull base and vertebrae: No fractures identified involving the cervical spine. No evidence of perched facets. Diffuse facet degenerative changes. Coronal reformatted images demonstrate an intact craniocervical junction, intact dens and intact lateral masses throughout. Lucent foci in the tip of the dens and in the MEDIAL RIGHT base of the dens likely represent degenerative cysts/geodes, as there are no lucent lesions elsewhere in the visualized skeleton. Soft tissues and spinal canal: No evidence of paraspinous or canal hematoma. Moderate to severe multifactorial spinal stenosis at C3-4 and mild multifactorial spinal stenosis at C5-6 and C6-7. Disc levels: Disc space narrowing and endplate hypertrophic changes at every cervical level and at C7-T1. Combination of uncinate and facet hypertrophy account for multilevel foraminal stenoses including: Mild BILATERAL C2-3, severe BILATERAL C3-4, moderate BILATERAL C4-5, severe BILATERAL C5-6, severe BILATERAL C6-7, moderate LEFT C7-T1. Upper chest: Visualized lung  apices clear. Visualized superior mediastinum normal. Other: Mild atherosclerosis involving the LEFT carotid artery bulb. IMPRESSION: CT Head: 1. No acute intracranial abnormality. 2. Moderate to severe generalized atrophy and chronic microvascular ischemic changes of the white matter diffusely. CT Cervical Spine: 1. No fractures identified involving the cervical spine. 2. Multilevel degenerative disc disease, spondylosis and facet degenerative changes resulting in multilevel foraminal stenoses as detailed above. 3. Moderate to severe multifactorial spinal stenosis at C3-4 and mild multifactorial spinal stenosis at C5-6 and C6-7. Electronically Signed   By: Evangeline Dakin M.D.   On: 08/25/2017 11:13   US Abdomen Limited  Result Date: 08/25/2017 CLINICAL DATA:  Elevated liver function tests. EXAM: ULTRASOUND ABDOMEN LIMITED RIGHT UPPER QUADRANT COMPARISON:  05/11/2006 CT abdomen/pelvis. FINDINGS: Gallbladder: Gallbladder is mildly distended. There are two nonmobile faintly shadowing gallstones in the gallbladder neck measuring up to 7 mm. No gallbladder wall thickening. No pericholecystic fluid. By report, technologist is unable to assess for sonographic Murphy sign given history of dementia. Common bile duct: Diameter: 4 mm Liver: Simple 1.9 cm anterior left liver lobe cyst. No additional focal liver lesions. Liver parenchymal echogenicity and echotexture are within normal limits. Portal vein is patent on color Doppler imaging with normal direction of blood flow towards the liver. IMPRESSION: 1. Cholelithiasis. Mildly distended gallbladder. No gallbladder wall thickening. No pericholecystic fluid. Unable to assess for sonographic Murphy sign given patient's mental status. If there is clinical concern for acute cholecystitis, consider hepatobiliary scintigraphy study. 2. No biliary ductal dilatation. 3. Small simple left liver lobe cyst.  No suspicious liver lesions. Electronically Signed   By: Ilona Sorrel  M.D.   On: 08/25/2017 13:54   Dg Hips Bilat With Pelvis Min 5 Views  Result Date: 08/25/2017 CLINICAL DATA:  CONFUSION, PT WAS FOUND OUTSIDE BY BROTHER THIS AM, DEMENTIA, HTN, NON SMOKER, PT WAS UNABLE TO GIVE ANY OTHER INFORMATION EXAM: DG HIP (WITH OR WITHOUT PELVIS)  5+V BILAT COMPARISON:  None. FINDINGS: No fracture or dislocation.  No bone lesion. There is left greater than right concentric hip joint space narrowing with a greater degree of superolateral hip joint space narrowing on the left. Mild subchondral cystic change along the superolateral acetabula. SI joints and symphysis pubis are normally spaced and aligned. Bones are demineralized. Soft tissues are unremarkable. IMPRESSION: 1. No fracture, dislocation or acute finding. Electronically Signed   By: Lajean Manes M.D.   On: 08/25/2017 11:24    Assessment and Plan:   1  Event:   Pt found in yard   He does not recall what happened.   ? Syncope   ? Rhythm   Continue telemetry  Echo normal     2. Elevated troponin  - has been trending down along with CK - no CP since admission, no known hx CP - echo results above, EF normal and no WMA - Would not pursue ischemic eval  May reflect injury in setting of rhabdo.  3. Bradycardia - HR high 30s at times while pt resting, he has no evidence of chronotropic incompetence. - he is asymptomatic - Would continue to follow on tele.  No indication for pacer at this time   Ambulate before d/c and follow   - was not on any rate-lowering rx  4. Anemia - H&H dropped from 04/12-04/14, will recheck - may be dilutional from hydration - may have contributed to original fall - mgt per IM   5   HTN  Follow BP on meds   Would not try to control too tightly to avoid hypotension   Check orthotatics when pt able.    Otherwise, per IM Principal Problem:   Rhabdomyolysis Active Problems:   Acute encephalopathy   Hypertension   AKI (acute kidney injury) ([place])   Lactic acidosis   Acute  hypernatremia   Hyperlipidemia   Diabetes mellitus type 2, uncontrolled, with complications ([place])   Hypothermia   For questions or updates, please contact [place] Please consult [email or web address] for contact info under Cardiology/STEMI.   Signed, Rosaria Ferries, PA-C  08/28/2017 8:44 AM   Pt seen and examined   I agree with findings of R Barrett and have amended note above to reflect my findings  Pt is an 84 yo who was found down   He does not remember anything   He is a poor historian  Not fully oriented  On exam, he is comfortable Neck:  JVP nromal  No bruits.; Lungs are CTA  Cardiac RRR  No S3  No signif murmurs  Abd benign  Ext without edema  Tele: with SB and SR   No signif pauses    Echo shows normal LVEF  Normal RVEF  Labs signif for signif elev of CK and Trop  Troponin may reflect injury in setting of other metabolic abnormalities   I would continue to follow on telemetry.   Once able to get up, ambulate on tele  Check orthosstatics   Will continue to follow .

## 2017-08-29 LAB — BASIC METABOLIC PANEL
Anion gap: 9 (ref 5–15)
BUN: 47 mg/dL — AB (ref 6–20)
CHLORIDE: 114 mmol/L — AB (ref 101–111)
CO2: 17 mmol/L — ABNORMAL LOW (ref 22–32)
Calcium: 8 mg/dL — ABNORMAL LOW (ref 8.9–10.3)
Creatinine, Ser: 1.86 mg/dL — ABNORMAL HIGH (ref 0.61–1.24)
GFR calc Af Amer: 36 mL/min — ABNORMAL LOW (ref >60)
GFR calc non Af Amer: 31 mL/min — ABNORMAL LOW (ref >60)
GLUCOSE: 240 mg/dL — AB (ref 65–99)
POTASSIUM: 4.1 mmol/L (ref 3.5–5.1)
Sodium: 140 mmol/L (ref 135–145)

## 2017-08-29 LAB — CBC
HCT: 29.4 % — ABNORMAL LOW (ref 39.0–52.0)
HEMOGLOBIN: 9 g/dL — AB (ref 13.0–17.0)
MCH: 27.3 pg (ref 26.0–34.0)
MCHC: 30.6 g/dL (ref 30.0–36.0)
MCV: 89.1 fL (ref 78.0–100.0)
Platelets: 110 10*3/uL — ABNORMAL LOW (ref 150–400)
RBC: 3.3 MIL/uL — ABNORMAL LOW (ref 4.22–5.81)
RDW: 14.6 % (ref 11.5–15.5)
WBC: 6 10*3/uL (ref 4.0–10.5)

## 2017-08-29 LAB — GLUCOSE, CAPILLARY
GLUCOSE-CAPILLARY: 158 mg/dL — AB (ref 65–99)
GLUCOSE-CAPILLARY: 130 mg/dL — AB (ref 65–99)
Glucose-Capillary: 121 mg/dL — ABNORMAL HIGH (ref 65–99)
Glucose-Capillary: 133 mg/dL — ABNORMAL HIGH (ref 65–99)
Glucose-Capillary: 165 mg/dL — ABNORMAL HIGH (ref 65–99)
Glucose-Capillary: 90 mg/dL (ref 65–99)

## 2017-08-29 LAB — CK: Total CK: 4019 U/L — ABNORMAL HIGH (ref 49–397)

## 2017-08-29 MED ORDER — LACTATED RINGERS IV SOLN
INTRAVENOUS | Status: DC
  Administered 2017-08-29 – 2017-08-30 (×3): via INTRAVENOUS

## 2017-08-29 NOTE — Plan of Care (Signed)
  Problem: Acute Rehab PT Goals(only PT should resolve) Goal: Pt Will Go Supine/Side To Sit Outcome: Progressing Flowsheets (Taken 08/29/2017 1149) Pt will go Supine/Side to Sit: with minimal assist Goal: Patient Will Transfer Sit To/From Stand Outcome: Progressing Flowsheets (Taken 08/29/2017 1149) Patient will transfer sit to/from stand: with minimal assist Goal: Pt Will Transfer Bed To Chair/Chair To Bed Outcome: Progressing Flowsheets (Taken 08/29/2017 1149) Pt will Transfer Bed to Chair/Chair to Bed: with min assist Goal: Pt Will Ambulate Outcome: Progressing Flowsheets (Taken 08/29/2017 1149) Pt will Ambulate: 25 feet;with moderate assist;with rolling walker  11:50 AM, 08/29/17 Lonell Grandchild, MPT Physical Therapist with [facility] [phone] office [phone] mobile phone

## 2017-08-29 NOTE — Clinical Social Work Note (Signed)
Clinical Social Work Assessment  Patient Details  Name: Paul Perez MRN: [id number] Date of Birth: 12-Apr-1928  Date of referral:  08/29/17               Reason for consult:  Facility Placement                Permission sought to share information with:    Permission granted to share information::     Name::        Agency::     Relationship::     Contact Information:  Daughter, [name], at bedside  Housing/Transportation Living arrangements for the past 2 months:  Single Family Home Source of Information:  Patient, Adult Children Patient Interpreter Needed:  None Criminal Activity/Legal Involvement Pertinent to Current Situation/Hospitalization:  No - Comment as needed Significant Relationships:  Adult Children Lives with:  Self Do you feel safe going back to the place where you live?  Yes Need for family participation in patient care:  Yes (Comment)  Care giving concerns:  None identified at baseline.    Social Worker assessment / plan:  At baseline, patient ambulates independently, drives and is independent in his ADLs.  He has family support that checks on him throughout the day. Patient is reluctantly agreeable to STR at SNF.   Employment status:  Retired Nurse, adult PT Recommendations:  [place] / Referral to community resources:  [place]  Patient/Family's Response to care:  Patient and family are considering SNF.  Patient/Family's Understanding of and Emotional Response to Diagnosis, Current Treatment, and Prognosis:  Patient and family understand patient's diagnosis, treatment and prognosis and are considering STR at SNF.   Emotional Assessment Appearance:  Appears stated age Attitude/Demeanor/Rapport:    Affect (typically observed):  Accepting, Calm Orientation:  Oriented to Self Alcohol / Substance use:  Not Applicable Psych involvement (Current and /or in the community):  No  (Comment)  Discharge Needs  Concerns to be addressed:  Discharge Planning Concerns Readmission within the last 30 days:  Yes Current discharge risk:  None Barriers to Discharge:  No Barriers Identified   Ihor Gully, LCSW 08/29/2017, 1:29 PM

## 2017-08-29 NOTE — Care Management (Signed)
CM consulted for Home health. Patient recommended for SNF/supervision. CSW following up.

## 2017-08-29 NOTE — Progress Notes (Signed)
Dr Alan Ripper note reviewed. Patient found down in his yard, events leading to this unknown. It is not clear if he had syncope. Elevated trop in setting of rhabdo, do not suspect ACS. Essentially normal echo. Tele with sinus brady at times to 40s primarily in the AM hours, afternoon and evening rates more normal. The significance of his heart rates is unclear at this time, particularly in the setting of ongoing hypothermia. No plans for further cardiac testing at this time, will need a 2 week event monitor placed and f/u with our clinic in 3 weeks. We will sign off inpatient care.   Carlyle Dolly MD

## 2017-08-29 NOTE — Progress Notes (Signed)
PROGRESS NOTE    [name]  ZSW:[id number] DOB: 1927-07-14 DOA: 08/25/2017 PCP: Jani Gravel, MD   Brief Narrative:   Paul Perez is a 84 y.o. male with medical history significant for diabetes mellitus, dementia and hypertension who was last seen in the night before on his riding mower mowing the yard.  He was reportedly found by his brother on the ground and the patient was noted to be shaking with complaints of pain.  He is brought to the emergency department with hypothermia and is admitted with rhabdomyolysis along with AK I on CKD stage III secondarily.  He has been placed on aggressive IV fluid hydration with improvement in rhabdomyolysis noted thus far.  He has been seen by physical therapy with recommendations for skilled nursing facility placement.   Assessment & Plan:   Principal Problem:   Rhabdomyolysis Active Problems:   Acute encephalopathy   Hypertension   AKI (acute kidney injury) (HCC)   Bradycardia   Lactic acidosis   Elevated troponin   Acute hypernatremia   Hyperlipidemia   Diabetes mellitus type 2, uncontrolled, with complications (HCC)   Hypothermia   1. Acute encephalopathy-multifactorial in setting of senile dementia with behavioral disturbances.  This has not been improved without any agitation over the last couple days. 2. Hypertension.  Continue to monitor closely.  Improved with restart of home amlodipine yesterday. 3. AK I on CKD stage III-improving.  Continue improvement noted while on IV fluid.  This appears to be secondary to rhabdomyolysis.  Continue to monitor with repeat a.m. labs and avoid nephrotoxic agents.  Plan to remove Foley today. 4. Rhabdomyolysis with associated troponin elevation-improving.  Both CK and troponin levels are currently downtrending and it is felt that troponin elevation is not related to ACS but rather the rhabdomyolysis.  Cardiology evaluation appreciated-no plans for ischemic workup at this time.  2D  echocardiogram with no significant findings aside from grade 1 diastolic dysfunction.  EF 60-65%. 5. Intermittent bradycardia.  Cardiology does recommend 2-week event monitoring with follow-up in the clinic in 3 weeks. 6. Hypernatremia and hyperchloremia.  Resolved with use of D5 and quarter normal saline.  We will change fluid to LR at 100 cc/h at this time. 7. Dyslipidemia.  Continue to hold statin in face of acute rhabdomyolysis. 8. Hypothermia with associated bradycardia.  Improved core body temperature and heart rate noted at this time. 9. Type 2 diabetes- improved control.  Continue sliding scale insulin every 4 hours.  Soft diet.  D5 in IV fluid has been discontinued. 10. Progressive dementia.  Agree with skilled nursing facility placement due to safety issues at home with living independently.   DVT prophylaxis: SCDs Code Status: Full code Family Communication: None currently at bedside Disposition Plan: Continue aggressive IV fluid hydration with plans for likely discharge to skilled nursing facility in a.m.   Consultants:   Cardiology  Procedures:   None  Antimicrobials:   None   Subjective: Patient seen and evaluated today with no acute complaints or concerns noted.  He appears to be much less confused and agitated overall.  His lab work demonstrates improvement with aggressive IV fluid hydration.  No acute overnight events noted.  He continues to have episodes of intermittent bradycardia for which cardiology recommends event monitoring in the outpatient setting.  Objective: Vitals:   08/28/17 1454 08/28/17 2158 08/29/17 0512 08/29/17 1423  BP: (!) 172/58 (!) 160/49 (!) 152/49 (!) 158/54  Pulse: 65 60 (!) 53 (!) 44  Resp: 12  18 16 16   Temp: (!) 97.5 F (36.4 C) 97.9 F (36.6 C) (!) 96.7 F (35.9 C) 97.7 F (36.5 C)  TempSrc: Oral Oral Axillary Oral  SpO2: 100% 100% 97% 98%  Weight:      Height:        Intake/Output Summary (Last 24 hours) at 08/29/2017  1533 Last data filed at 08/29/2017 1518 Gross per 24 hour  Intake 2977.5 ml  Output 1600 ml  Net 1377.5 ml   Filed Weights   08/25/17 1838 08/26/17 0408  Weight: 84.6 kg (186 lb 8.2 oz) 84.7 kg (186 lb 11.7 oz)    Examination:  General exam: Appears calm and comfortable; more alert and responsive today Respiratory system: Clear to auscultation. Respiratory effort normal. Cardiovascular system: S1 & S2 heard, RRR. No JVD, murmurs, rubs, gallops or clicks. No pedal edema.  Foley with clear, yellow urine output noted. Gastrointestinal system: Abdomen is nondistended, soft and nontender. No organomegaly or masses felt. Normal bowel sounds heard. Central nervous system: Alert and oriented. No focal neurological deficits. Extremities: Symmetric 5 x 5 power. Skin: No rashes, lesions or ulcers    Data Reviewed: I have personally reviewed following labs and imaging studies  CBC: Recent Labs  Lab 08/25/17 1204 08/27/17 0607 08/28/17 0946 08/29/17 0658  WBC 11.6* 5.6 5.4 6.0  NEUTROABS 10.2*  --   --   --   HGB 12.1* 8.9* 8.6* 9.0*  HCT 39.4 29.3* 28.9* 29.4*  MCV 88.3 88.8 90.6 89.1  PLT 216 114* 105* 024*   Basic Metabolic Panel: Recent Labs  Lab 08/25/17 1204 08/26/17 0539 08/27/17 0607 08/28/17 0634 08/29/17 0658  NA 146* 147* 149* 146* 140  K 5.6* 5.0 4.6 4.3 4.1  CL 111 120* 124* 121* 114*  CO2 15* 14* 12* 18* 17*  GLUCOSE 178* 153* 138* 152* 240*  BUN 89* 96* 92* 67* 47*  CREATININE 2.79* 2.73* 2.63* 2.07* 1.86*  CALCIUM 9.3 8.3* 8.3* 8.1* 8.0*   GFR: Estimated Creatinine Clearance: 28.3 mL/min (A) (by C-G formula based on SCr of 1.86 mg/dL (H)). Liver Function Tests: Recent Labs  Lab 08/25/17 1204 08/28/17 0634  AST 355* 104*  ALT 101* 64*  ALKPHOS 70 44  BILITOT 0.9 0.5  PROT 7.4 5.3*  ALBUMIN 3.8 2.6*   No results for input(s): LIPASE, AMYLASE in the last 168 hours. No results for input(s): AMMONIA in the last 168 hours. Coagulation  Profile: Recent Labs  Lab 08/25/17 1204  INR 1.13   Cardiac Enzymes: Recent Labs  Lab 08/25/17 1204 08/25/17 1444 08/25/17 2207 08/26/17 0539 08/27/17 0607 08/28/17 0634 08/29/17 0658  CKTOTAL 09,735*  --   --  13,395* 8,539* 6,136* 4,019*  TROPONINI 9.31* 8.03* 6.94* 4.45*  --   --   --    BNP (last 3 results) No results for input(s): PROBNP in the last 8760 hours. HbA1C: No results for input(s): HGBA1C in the last 72 hours. CBG: Recent Labs  Lab 08/28/17 2156 08/29/17 0100 08/29/17 0505 08/29/17 0747 08/29/17 1138  GLUCAP 110* 121* 133* 165* 158*   Lipid Profile: No results for input(s): CHOL, HDL, LDLCALC, TRIG, CHOLHDL, LDLDIRECT in the last 72 hours. Thyroid Function Tests: No results for input(s): TSH, T4TOTAL, FREET4, T3FREE, THYROIDAB in the last 72 hours. Anemia Panel: No results for input(s): VITAMINB12, FOLATE, FERRITIN, TIBC, IRON, RETICCTPCT in the last 72 hours. Sepsis Labs: Recent Labs  Lab 08/25/17 1204 08/25/17 1231 08/25/17 1633 08/26/17 0539  LATICACIDVEN 2.2* 2.4* 0.5 1.0  Recent Results (from the past 240 hour(s))  Urine culture     Status: None   Collection Time: 08/25/17 10:31 AM  Result Value Ref Range Status   Specimen Description   Final    URINE, CLEAN CATCH Performed at [facility], [address] [place], [place] [address]    Special Requests   Final    NONE Performed at [facility], [address] [place], [place] [address]    Culture   Final    NO GROWTH Performed at [facility], [place] [address], [place], [place] [address]    Report Status 08/26/2017 FINAL  Final  MRSA PCR Screening     Status: None   Collection Time: 08/25/17  6:21 PM  Result Value Ref Range Status   MRSA by PCR NEGATIVE NEGATIVE Final    Comment:        The GeneXpert MRSA Assay (FDA approved for NASAL specimens only), is one component of a comprehensive MRSA colonization surveillance program. It is not intended to diagnose  MRSA infection nor to guide or monitor treatment for MRSA infections. Performed at [facility], [address], [place], [place] [address]          Radiology Studies: No results found.      Scheduled Meds: . amLODipine  5 mg Oral Daily  . insulin aspart  0-15 Units Subcutaneous Q4H   Continuous Infusions: . lactated ringers       LOS: 4 days    Time spent: 30 minutes    Lylith Bebeau Darleen Crocker, DO [facility] Pager [phone]  If 7PM-7AM, please contact night-coverage [email or web address] Password [facility] 08/29/2017, 3:33 PM

## 2017-08-29 NOTE — Evaluation (Signed)
Physical Therapy Evaluation Patient Details Name: Paul Perez MRN: [id number] DOB: 1927/08/22 Today's Date: 08/29/2017   History of Present Illness  Paul Perez is a 84 y.o. male with medical history significant for diabetes mellitus, dementia and hypertension who was last seen in the night before on his riding mower mowing the yard.  This morning, patient was reportedly found by his brother on the ground with a riding mower in the woods.  Patient was shaking and complaining of pain.  Much more confused than his baseline.  He reportedly had been outside all night.  Patient was brought into the emergency room.    Clinical Impression  Patient requires knees blocked when transferring from [facility] due to weakness and limited bilateral knee flexion, demonstrates slow unsteady steps with frequent scissoring of legs with near falls and required Mod/max assist for repositioning when put back to bed.  Patient will benefit from continued physical therapy in hospital and recommended venue below to increase strength, balance, endurance for safe ADLs and gait.    Follow Up Recommendations SNF;Supervision/Assistance - 24 hour    Equipment Recommendations  None recommended by PT    Recommendations for Other Services       Precautions / Restrictions Precautions Precautions: Fall Restrictions Weight Bearing Restrictions: No      Mobility  Bed Mobility Overal bed mobility: Needs Assistance Bed Mobility: Supine to Sit;Sit to Supine     Supine to sit: Mod assist;Max assist Sit to supine: Mod assist;Max assist      Transfers Overall transfer level: Needs assistance Equipment used: Rolling walker (2 wheeled) Transfers: Sit to/from [facility] Sit to Stand: Mod assist Stand pivot transfers: Mod assist       General transfer comment: patient difficulty with sit to stands due to limited bilateral knee flexion and weakness  Ambulation/Gait Ambulation/Gait assistance:  Mod assist;Max assist Ambulation Distance (Feet): 5 Feet Assistive device: Rolling walker (2 wheeled) Gait Pattern/deviations: Decreased step length - right;Decreased step length - left;Decreased stance time - right;Decreased stride length Gait velocity: decreased   General Gait Details: demonstrates slow labored short steps with frequent scissoring of legs requiring assistance to keep shoulder width apart, limited due to fatigue/weakness  Stairs            Wheelchair Mobility    Modified Rankin (Stroke Patients Only)       Balance Overall balance assessment: Needs assistance Sitting-balance support: Feet supported;Bilateral upper extremity supported Sitting balance-Leahy Scale: Fair     Standing balance support: Bilateral upper extremity supported;During functional activity Standing balance-Leahy Scale: Poor Standing balance comment: using RW                             Pertinent Vitals/Pain Pain Assessment: No/denies pain    Home Living Family/patient expects to be discharged to:: Private residence Living Arrangements: Alone;Children(with son per patient) Available Help at Discharge: Family Type of Home: House Home Access: Stairs to enter   [employer] of Steps: 2 Home Layout: One level Home Equipment: Cane - single point;Walker - 2 wheels;Bedside commode;Shower seat;Wheelchair - manual Additional Comments: information per patient    Prior Function Level of Independence: Independent         Comments: Patient states he ambulates community distances without AD     Hand Dominance        Extremity/Trunk Assessment   Upper Extremity Assessment Upper Extremity Assessment: Generalized weakness    Lower Extremity Assessment Lower  Extremity Assessment: Generalized weakness(bilateral knees 0-95 degrees - baseline per patient secondary to playing football)    Cervical / Trunk Assessment Cervical / Trunk Assessment: Normal   Communication   Communication: No difficulties  Cognition Arousal/Alertness: Awake/alert Behavior During Therapy: WFL for tasks assessed/performed Overall Cognitive Status: No family/caregiver present to determine baseline cognitive functioning                                 General Comments: answered questions appropriately, Alert & orientated to person      General Comments      Exercises     Assessment/Plan    PT Assessment Patient needs continued PT services  PT Problem List Decreased strength;Decreased range of motion;Decreased activity tolerance;Decreased balance;Decreased mobility       PT Treatment Interventions Gait training;Stair training;Functional mobility training;Therapeutic exercise;Patient/family education    PT Goals (Current goals can be found in the Care Plan section)  Acute Rehab PT Goals Patient Stated Goal: return home after rehab PT Goal Formulation: With patient Time For Goal Achievement: 09/12/17 Potential to Achieve Goals: Good    Frequency Min 3X/week   Barriers to discharge        Co-evaluation               AM-PAC PT "6 Clicks" Daily Activity  Outcome Measure Difficulty turning over in bed (including adjusting bedclothes, sheets and blankets)?: A Lot Difficulty moving from lying on back to sitting on the side of the bed? : A Lot Difficulty sitting down on and standing up from a chair with arms (e.g., wheelchair, bedside commode, etc,.)?: A Lot Help needed moving to and from a bed to chair (including a wheelchair)?: A Lot Help needed walking in hospital room?: A Lot Help needed climbing 3-5 steps with a railing? : Total 6 Click Score: 11    End of Session Equipment Utilized During Treatment: Gait belt Activity Tolerance: Patient tolerated treatment well;Patient limited by fatigue Patient left: in bed;with call bell/phone within reach;with bed alarm set Nurse Communication: Mobility status PT Visit Diagnosis:  Unsteadiness on feet (R26.81);Other abnormalities of gait and mobility (R26.89);Muscle weakness (generalized) (M62.81)    Time: 6153-7943 PT Time Calculation (min) (ACUTE ONLY): 26 min   Charges:   PT Evaluation $PT Eval Moderate Complexity: 1 Mod PT Treatments $Therapeutic Activity: 23-37 mins   PT G Codes:        11:47 AM, 09/21/2017 Lonell Grandchild, MPT Physical Therapist with [facility] 336 [phone] office [phone] mobile phone

## 2017-08-29 NOTE — [place] FL2 (Signed)
[place] MEDICAID FL2 LEVEL OF CARE SCREENING TOOL     IDENTIFICATION  Patient Name: Paul Perez Birthdate: Jul 01, 1927 Sex: male Admission Date (Current Location): 08/25/2017  [facility] and [place] Number:  [employer] and Address:  [place] [address], [place]      Provider Number: [id number]  Attending Physician Name and Address:  Rodena Goldmann, DO  Relative Name and Phone Number:       Current Level of Care: Hospital Recommended Level of Care: [place] Prior Approval Number:    Date Approved/Denied:   PASRR Number: [id number]  Discharge Plan: SNF    Current Diagnoses: Patient Active Problem List   Diagnosis Date Noted  . Lactic acidosis 08/25/2017  . Elevated troponin 08/25/2017  . Acute hypernatremia 08/25/2017  . Hyperlipidemia 08/25/2017  . Diabetes mellitus type 2, uncontrolled, with complications ([place]) 11/06/7626  . Hypothermia 08/25/2017  . Bradycardia 08/28/2013  . PAC (premature atrial contraction) 08/28/2013  . Altered mental state 08/20/2013  . Acute encephalopathy 08/20/2013  . Hypertension 08/20/2013  . AKI (acute kidney injury) ([place]) 08/20/2013  . Rhabdomyolysis 08/20/2013  . UTI (lower urinary tract infection) 08/20/2013    Orientation RESPIRATION BLADDER Height & Weight     Self  Normal Continent Weight: 186 lb 11.7 oz (84.7 kg) Height:  5\' 10"  (177.8 cm)  BEHAVIORAL SYMPTOMS/MOOD NEUROLOGICAL BOWEL NUTRITION STATUS      Continent Diet(DYS 3 (see d/c summary))  AMBULATORY STATUS COMMUNICATION OF NEEDS Skin   Extensive Assist Verbally Normal                       Personal Care Assistance Level of Assistance  Bathing, Feeding, Dressing Bathing Assistance: Limited assistance Feeding assistance: Independent Dressing Assistance: Limited assistance     Functional Limitations Info  Sight, Hearing, Speech Sight Info: Adequate Hearing Info: Adequate Speech Info:  Adequate    SPECIAL CARE FACTORS FREQUENCY  PT (By licensed PT)     PT Frequency: 5x/week              Contractures Contractures Info: Not present    Additional Factors Info  Code Status, Allergies Code Status Info: Full code Allergies Info: NKA           Current Medications (08/29/2017):  This is the current hospital active medication list Current Facility-Administered Medications  Medication Dose Route Frequency Provider Last Rate Last Dose  . acetaminophen (TYLENOL) tablet 650 mg  650 mg Oral Q6H PRN Annita Brod, MD       Or  . acetaminophen (TYLENOL) suppository 650 mg  650 mg Rectal Q6H PRN Annita Brod, MD      . amLODipine (NORVASC) tablet 5 mg  5 mg Oral Daily Manuella Ghazi, Pratik D, DO   5 mg at 08/29/17 0849  . dextrose 5 % and 0.2 % NaCl infusion   Intravenous Continuous Heath Lark D, DO 125 mL/hr at 08/29/17 1044    . haloperidol lactate (HALDOL) injection 2 mg  2 mg Intravenous Q6H PRN Lovey Newcomer T, NP   2 mg at 08/28/17 2106  . insulin aspart (novoLOG) injection 0-15 Units  0-15 Units Subcutaneous Q4H Shah, Pratik D, DO   3 Units at 08/29/17 1254  . ondansetron (ZOFRAN) tablet 4 mg  4 mg Oral Q6H PRN Annita Brod, MD       Or  . ondansetron [facility]) injection 4 mg  4 mg Intravenous Q6H PRN  Annita Brod, MD      . polyethylene glycol (MIRALAX / GLYCOLAX) packet 17 g  17 g Oral Daily PRN Annita Brod, MD         Discharge Medications: Please see discharge summary for a list of discharge medications.  Relevant Imaging Results:  Relevant Lab Results:   Additional Information SSN [id number], Clydene Pugh, LCSW

## 2017-08-30 ENCOUNTER — Inpatient Hospital Stay
Admission: RE | Admit: 2017-08-30 | Discharge: 2018-01-11 | Disposition: A | Discharge status: Skilled Nursing Facility | Payer: Medicare Other | Source: Ambulatory Visit | Attending: Internal Medicine | Admitting: Internal Medicine

## 2017-08-30 DIAGNOSIS — N183 Chronic kidney disease, stage 3 unspecified: Principal | ICD-10-CM

## 2017-08-30 DIAGNOSIS — E118 Type 2 diabetes mellitus with unspecified complications: Secondary | ICD-10-CM

## 2017-08-30 DIAGNOSIS — E1165 Type 2 diabetes mellitus with hyperglycemia: Secondary | ICD-10-CM

## 2017-08-30 DIAGNOSIS — N179 Acute kidney failure, unspecified: Secondary | ICD-10-CM

## 2017-08-30 DIAGNOSIS — I10 Essential (primary) hypertension: Secondary | ICD-10-CM

## 2017-08-30 DIAGNOSIS — E870 Hyperosmolality and hypernatremia: Secondary | ICD-10-CM

## 2017-08-30 DIAGNOSIS — G9340 Encephalopathy, unspecified: Secondary | ICD-10-CM

## 2017-08-30 DIAGNOSIS — M6282 Rhabdomyolysis: Principal | ICD-10-CM

## 2017-08-30 DIAGNOSIS — E860 Dehydration: Secondary | ICD-10-CM

## 2017-08-30 LAB — COMPREHENSIVE METABOLIC PANEL
ALBUMIN: 2.7 g/dL — AB (ref 3.5–5.0)
ALT: 53 U/L (ref 17–63)
AST: 61 U/L — AB (ref 15–41)
Alkaline Phosphatase: 50 U/L (ref 38–126)
Anion gap: 8 (ref 5–15)
BUN: 38 mg/dL — AB (ref 6–20)
CHLORIDE: 114 mmol/L — AB (ref 101–111)
CO2: 18 mmol/L — AB (ref 22–32)
CREATININE: 1.75 mg/dL — AB (ref 0.61–1.24)
Calcium: 8.3 mg/dL — ABNORMAL LOW (ref 8.9–10.3)
GFR calc Af Amer: 38 mL/min — ABNORMAL LOW (ref >60)
GFR calc non Af Amer: 33 mL/min — ABNORMAL LOW (ref >60)
Glucose, Bld: 135 mg/dL — ABNORMAL HIGH (ref 65–99)
Potassium: 4.6 mmol/L (ref 3.5–5.1)
SODIUM: 140 mmol/L (ref 135–145)
Total Bilirubin: 1 mg/dL (ref 0.3–1.2)
Total Protein: 5.6 g/dL — ABNORMAL LOW (ref 6.5–8.1)

## 2017-08-30 LAB — CBC
HCT: 30.1 % — ABNORMAL LOW (ref 39.0–52.0)
Hemoglobin: 9.3 g/dL — ABNORMAL LOW (ref 13.0–17.0)
MCH: 27.2 pg (ref 26.0–34.0)
MCHC: 30.9 g/dL (ref 30.0–36.0)
MCV: 88 fL (ref 78.0–100.0)
PLATELETS: 126 10*3/uL — AB (ref 150–400)
RBC: 3.42 MIL/uL — AB (ref 4.22–5.81)
RDW: 14.3 % (ref 11.5–15.5)
WBC: 5.8 10*3/uL (ref 4.0–10.5)

## 2017-08-30 LAB — GLUCOSE, CAPILLARY
Glucose-Capillary: 106 mg/dL — ABNORMAL HIGH (ref 65–99)
Glucose-Capillary: 107 mg/dL — ABNORMAL HIGH (ref 65–99)
Glucose-Capillary: 111 mg/dL — ABNORMAL HIGH (ref 65–99)
Glucose-Capillary: 181 mg/dL — ABNORMAL HIGH (ref 65–99)

## 2017-08-30 LAB — CK: Total CK: 3057 U/L — ABNORMAL HIGH (ref 49–397)

## 2017-08-30 MED ORDER — TRIAMTERENE-HCTZ 37.5-25 MG PO TABS: 1.0000 | ORAL_TABLET | Freq: Every day | ORAL | Status: DC

## 2017-08-30 MED ORDER — POLYETHYLENE GLYCOL 3350 17 G PO PACK: 17.0000 g | PACK | Freq: Every day | ORAL | Status: DC | PRN

## 2017-08-30 MED ORDER — LISINOPRIL 20 MG PO TABS: 20.0000 mg | ORAL_TABLET | Freq: Every day | ORAL | Status: DC

## 2017-08-30 NOTE — Discharge Summary (Signed)
Physician Discharge Summary  Paul Perez QPY:[id number] DOB: Jun 19, 1927 DOA: 08/25/2017  PCP: Paul Gravel, MD  Admit date: 08/25/2017 Discharge date: 08/30/2017  Time spent: 35 minutes  Recommendations for Outpatient Follow-up:  1. Reassess blood pressure and adjust antihypertensive regimen as needed 2. Repeat basic metabolic panel to follow electrolytes and renal function  Discharge Diagnoses:  Principal Problem:   Rhabdomyolysis Active Problems:   Acute encephalopathy   Hypertension   AKI (acute kidney injury) ([place])   Bradycardia   Lactic acidosis   Elevated troponin   Acute hypernatremia   Hyperlipidemia   Diabetes mellitus type 2, uncontrolled, with complications (HCC)   Hypothermia   Acute renal failure superimposed on stage 3 chronic kidney disease ([place])   Dehydration   Discharge Condition: Stable and improved.  Patient will be discharged to skilled nursing facility for further care and rehabilitation.  Follow-up with PCP 2 weeks after being discharged from the skilled nursing facility.  Outpatient follow-up with cardiology for event monitoring and further cardiac workup.  Diet recommendation: Heart healthy, modified carbohydrates and dysphagia 3 with thin liquids.  Filed Weights   08/25/17 1838 08/26/17 0408  Weight: 84.6 kg (186 lb 8.2 oz) 84.7 kg (186 lb 11.7 oz)    History of present illness:  84 y.o.malewith medical history significant fordiabetes mellitus, dementia and hypertension who was last seen in the night before on his riding mower mowing the yard.  He was reportedly found by his brother on the ground and the patient was noted to be shaking with complaints of pain.  He is brought to the emergency department with hypothermia and is admitted with rhabdomyolysis along with AKI on CKD stage III   Hospital Course:  1-acute encephalopathy: Multifactorial in the setting of senile dementia with behavioral disturbances.  After proper hydration and  electrolytes repletion patient mentation is close to his baseline at discharge.  Patient discharged to skilled nursing facility for further care and rehabilitation.  2-nontraumatic rhabdomyolysis: -Improved/resolved with aggressive fluid resuscitation. -patient CK levels improved/resolving -advise to maintain adequate hydration   3-AKI on CKD stage 3 -in the setting of dehydration, rhabdomyolysis and continue use of nephrotoxic agents prior to admission  -Renal function back to his baseline after fluid resuscitation and holding nephrotoxic agents. -Okay to resume patient ACE inhibitor adjusted dose at time of discharge. -Diuretics to be resume in 3 days  -repeat BMET to follow electrolytes and renal function   4-HTN -BP stable -continue heart healthy diet and current adjusted antihypertensive regimen  -Reassess blood pressure and further adjust antihypertensive drugs as needed.  5-intermittent bradycardia -cardiology has recommended event monitoring and outpatient follow up -no episodes of lightheadedness or syncope prior to discharge  -stable vital signs   6-hypernatremia and hyperchloremia  -in the setting of dehydration  -resolved with IVF's  7-elevated troponin  -Per cardiology appears to be secondary to rhabdomyolysis no acute ACS. 2D echo with no significant findings aside from grade 1 diastolic dysfunction; preserved ejection fraction (60-65%). -No further inpatient workup has been recommended. -Patient will follow with cardiology service as an outpatient  8-type 2 diabetes  -advise to follow modified carb diet -resume home hypoglycemic regimen   9-progressive dementia, dysphagia and deconditioning  -unsafe to be living alone -discharge to SNF for further evaluation and work up  -discharge on dysphagia 3 diet and thin liquids    Procedures:  Echo  See below for x-reports   Consultations:  Cardiology   Discharge Exam: Vitals:   08/30/17 1332 08/30/17  1332   BP:  (!) 167/74  Pulse: 63 63  Resp:  18  Temp:  (!) 97.3 F (36.3 C)  SpO2: 100% 100%   General exam: Appears calm and comfortable; more alert and responsive. In no distress. Following commands appropriately.  Respiratory system: Clear to auscultation. Respiratory effort normal. Cardiovascular system: S1 & S2 heard, RRR. No JVD, murmurs, rubs, gallops or clicks. No pedal edema. Gastrointestinal system: Abdomen is nondistended, soft and nontender. No organomegaly or masses felt. Normal bowel sounds heard. Central nervous system: Alert and oriented. No focal neurological deficits. Extremities: Symmetric 5 x 5 power. Skin: No rashes, lesions or ulcers   Discharge Instructions   Discharge Instructions    Diet - low sodium heart healthy   Complete by:  As directed    Discharge instructions   Complete by:  As directed    Maintain adequate hydration  Repeat BMET in 5 days to follow electrolytes and renal function  Follow medications as prescribed  Follow heart healthy diet  Physical rehabilitation as per SNF protocol     Allergies as of 08/30/2017   No Known Allergies     Medication List    TAKE these medications   amLODipine 5 MG tablet Commonly known as:  NORVASC Take 5 mg by mouth daily.   lisinopril 20 MG tablet Commonly known as:  PRINIVIL,ZESTRIL Take 1 tablet (20 mg total) by mouth daily. What changed:    medication strength  how much to take   meclizine 25 MG tablet Commonly known as:  ANTIVERT Take 25 mg by mouth 3 (three) times daily as needed for dizziness.   metFORMIN 500 MG tablet Commonly known as:  GLUCOPHAGE Take 1 tablet by mouth 2 (two) times daily.   pioglitazone 15 MG tablet Commonly known as:  ACTOS Take 1 tablet by mouth daily.   polyethylene glycol packet Commonly known as:  MIRALAX / GLYCOLAX Take 17 g by mouth daily as needed for mild constipation.   simvastatin 40 MG tablet Commonly known as:  ZOCOR Take 40 mg by mouth daily.    triamterene-hydrochlorothiazide 37.5-25 MG tablet Commonly known as:  MAXZIDE-25 Take 1 tablet by mouth daily. Start taking on:  09/02/2017 What changed:  These instructions start on 09/02/2017. If you are unsure what to do until then, ask your doctor or other care provider.   zolpidem 10 MG tablet Commonly known as:  AMBIEN Take 10 mg by mouth at bedtime as needed for sleep.      No Known Allergies  Contact information for follow-up providers    Satira Sark, MD Follow up.   Specialty:  Cardiology Why:  The office will contact you to arrange a cardiac event monitor to follow your heart rate after hospital discharge.  Contact information: [place] [address] [phone]        Imogene Burn, PA-C Follow up on 09/27/2017.   Specialty:  Cardiology Why:  Cardiology Hospital Follow-Up on 09/27/2017 at 2:00PM.  Contact information: [place] [address] [phone]        Paul Gravel, MD. Schedule an appointment as soon as possible for a visit in 2 week(s).   Specialty:  Internal Medicine Why:  after discharge from SNF Contact information: [address] [place] [address] [phone]            Contact information for after-discharge care    [facility] .  Service:  Skilled Nursing Contact information: [address] [place] [phone]                  The results of significant diagnostics from this hospitalization (including imaging, microbiology, ancillary and laboratory) are listed below for reference.    Significant Diagnostic Studies: Dg Chest 1 View  Result Date: 08/25/2017 CLINICAL DATA:  Confusion.  Found outside.  History of dementia. EXAM: CHEST  1 VIEW COMPARISON:  07/24/2014 FINDINGS: The cardiac silhouette is normal in size. The thoracic aorta is mildly tortuous with atherosclerotic calcification noted. No airspace  consolidation, edema, pleural effusion, or pneumothorax is identified. A 1.7 cm ovoid density projecting below the right hemidiaphragm was not clearly present on the prior study and could reflect a calcified granuloma in the liver or posterior costophrenic sulcus. No acute osseous abnormality is seen. IMPRESSION: No active disease. Electronically Signed   By: Logan Bores M.D.   On: 08/25/2017 11:25   Ct Head Wo Contrast  Result Date: 08/25/2017 CLINICAL DATA:  84 year old who was found outside at his house after laying on the ground all might. Hypothermia. EXAM: CT HEAD WITHOUT CONTRAST CT CERVICAL SPINE WITHOUT CONTRAST TECHNIQUE: Multidetector CT imaging of the head and cervical spine was performed following the standard protocol without intravenous contrast. Multiplanar CT image reconstructions of the cervical spine were also generated. COMPARISON:  MRI brain 08/21/2013.  CT head 08/20/2013. FINDINGS: CT HEAD FINDINGS Brain: Moderate to severe age related cortical and deep atrophy, progressive since 46. Moderate chronic microvascular ischemic changes of the white matter diffusely, unchanged. No mass lesion. No midline shift. No acute hemorrhage or hematoma. No extra-axial fluid collections. No evidence of acute infarction. Incidental note of partial empty sella. Vascular: Moderate BILATERAL carotid siphon and mild BILATERAL vertebral artery atherosclerosis. No hyperdense vessel. Skull: No skull fracture or other focal osseous abnormality involving the skull. Sinuses/Orbits: Small mucous retention cyst or polyp involving the MEDIAL wall of the LEFT maxillary sinus. Minimal mucosal thickening involving the base of the frontal sinuses bilaterally. Remaining visualized paranasal sinuses, BILATERAL mastoid air cells and BILATERAL middle ear cavities well-aerated. Visualized orbits and globes normal in appearance. Other: None. CT CERVICAL SPINE FINDINGS Alignment: Anatomic POSTERIOR alignment. Straightening of  the usual lordosis. Skull base and vertebrae: No fractures identified involving the cervical spine. No evidence of perched facets. Diffuse facet degenerative changes. Coronal reformatted images demonstrate an intact craniocervical junction, intact dens and intact lateral masses throughout. Lucent foci in the tip of the dens and in the MEDIAL RIGHT base of the dens likely represent degenerative cysts/geodes, as there are no lucent lesions elsewhere in the visualized skeleton. Soft tissues and spinal canal: No evidence of paraspinous or canal hematoma. Moderate to severe multifactorial spinal stenosis at C3-4 and mild multifactorial spinal stenosis at C5-6 and C6-7. Disc levels: Disc space narrowing and endplate hypertrophic changes at every cervical level and at C7-T1. Combination of uncinate and facet hypertrophy account for multilevel foraminal stenoses including: Mild BILATERAL C2-3, severe BILATERAL C3-4, moderate BILATERAL C4-5, severe BILATERAL C5-6, severe BILATERAL C6-7, moderate LEFT C7-T1. Upper chest: Visualized lung apices clear. Visualized superior mediastinum normal. Other: Mild atherosclerosis involving the LEFT carotid artery bulb. IMPRESSION: CT Head: 1. No acute intracranial abnormality. 2. Moderate to severe generalized atrophy and chronic microvascular ischemic changes of the white matter diffusely. CT Cervical Spine: 1. No fractures identified involving the cervical spine. 2. Multilevel degenerative disc disease, spondylosis and facet degenerative changes resulting in multilevel foraminal  stenoses as detailed above. 3. Moderate to severe multifactorial spinal stenosis at C3-4 and mild multifactorial spinal stenosis at C5-6 and C6-7. Electronically Signed   By: Evangeline Dakin M.D.   On: 08/25/2017 11:13   Ct Cervical Spine Wo Contrast  Result Date: 08/25/2017 CLINICAL DATA:  84 year old who was found outside at his house after laying on the ground all might. Hypothermia. EXAM: CT HEAD  WITHOUT CONTRAST CT CERVICAL SPINE WITHOUT CONTRAST TECHNIQUE: Multidetector CT imaging of the head and cervical spine was performed following the standard protocol without intravenous contrast. Multiplanar CT image reconstructions of the cervical spine were also generated. COMPARISON:  MRI brain 08/21/2013.  CT head 08/20/2013. FINDINGS: CT HEAD FINDINGS Brain: Moderate to severe age related cortical and deep atrophy, progressive since 39. Moderate chronic microvascular ischemic changes of the white matter diffusely, unchanged. No mass lesion. No midline shift. No acute hemorrhage or hematoma. No extra-axial fluid collections. No evidence of acute infarction. Incidental note of partial empty sella. Vascular: Moderate BILATERAL carotid siphon and mild BILATERAL vertebral artery atherosclerosis. No hyperdense vessel. Skull: No skull fracture or other focal osseous abnormality involving the skull. Sinuses/Orbits: Small mucous retention cyst or polyp involving the MEDIAL wall of the LEFT maxillary sinus. Minimal mucosal thickening involving the base of the frontal sinuses bilaterally. Remaining visualized paranasal sinuses, BILATERAL mastoid air cells and BILATERAL middle ear cavities well-aerated. Visualized orbits and globes normal in appearance. Other: None. CT CERVICAL SPINE FINDINGS Alignment: Anatomic POSTERIOR alignment. Straightening of the usual lordosis. Skull base and vertebrae: No fractures identified involving the cervical spine. No evidence of perched facets. Diffuse facet degenerative changes. Coronal reformatted images demonstrate an intact craniocervical junction, intact dens and intact lateral masses throughout. Lucent foci in the tip of the dens and in the MEDIAL RIGHT base of the dens likely represent degenerative cysts/geodes, as there are no lucent lesions elsewhere in the visualized skeleton. Soft tissues and spinal canal: No evidence of paraspinous or canal hematoma. Moderate to severe  multifactorial spinal stenosis at C3-4 and mild multifactorial spinal stenosis at C5-6 and C6-7. Disc levels: Disc space narrowing and endplate hypertrophic changes at every cervical level and at C7-T1. Combination of uncinate and facet hypertrophy account for multilevel foraminal stenoses including: Mild BILATERAL C2-3, severe BILATERAL C3-4, moderate BILATERAL C4-5, severe BILATERAL C5-6, severe BILATERAL C6-7, moderate LEFT C7-T1. Upper chest: Visualized lung apices clear. Visualized superior mediastinum normal. Other: Mild atherosclerosis involving the LEFT carotid artery bulb. IMPRESSION: CT Head: 1. No acute intracranial abnormality. 2. Moderate to severe generalized atrophy and chronic microvascular ischemic changes of the white matter diffusely. CT Cervical Spine: 1. No fractures identified involving the cervical spine. 2. Multilevel degenerative disc disease, spondylosis and facet degenerative changes resulting in multilevel foraminal stenoses as detailed above. 3. Moderate to severe multifactorial spinal stenosis at C3-4 and mild multifactorial spinal stenosis at C5-6 and C6-7. Electronically Signed   By: Evangeline Dakin M.D.   On: 08/25/2017 11:13   US Abdomen Limited  Result Date: 08/25/2017 CLINICAL DATA:  Elevated liver function tests. EXAM: ULTRASOUND ABDOMEN LIMITED RIGHT UPPER QUADRANT COMPARISON:  05/11/2006 CT abdomen/pelvis. FINDINGS: Gallbladder: Gallbladder is mildly distended. There are two nonmobile faintly shadowing gallstones in the gallbladder neck measuring up to 7 mm. No gallbladder wall thickening. No pericholecystic fluid. By report, technologist is unable to assess for sonographic Murphy sign given history of dementia. Common bile duct: Diameter: 4 mm Liver: Simple 1.9 cm anterior left liver lobe cyst. No additional focal liver lesions. Liver parenchymal  echogenicity and echotexture are within normal limits. Portal vein is patent on color Doppler imaging with normal direction of  blood flow towards the liver. IMPRESSION: 1. Cholelithiasis. Mildly distended gallbladder. No gallbladder wall thickening. No pericholecystic fluid. Unable to assess for sonographic Murphy sign given patient's mental status. If there is clinical concern for acute cholecystitis, consider hepatobiliary scintigraphy study. 2. No biliary ductal dilatation. 3. Small simple left liver lobe cyst.  No suspicious liver lesions. Electronically Signed   By: Ilona Sorrel M.D.   On: 08/25/2017 13:54   Dg Hips Bilat With Pelvis Min 5 Views  Result Date: 08/25/2017 CLINICAL DATA:  CONFUSION, PT WAS FOUND OUTSIDE BY BROTHER THIS AM, DEMENTIA, HTN, NON SMOKER, PT WAS UNABLE TO GIVE ANY OTHER INFORMATION EXAM: DG HIP (WITH OR WITHOUT PELVIS) 5+V BILAT COMPARISON:  None. FINDINGS: No fracture or dislocation.  No bone lesion. There is left greater than right concentric hip joint space narrowing with a greater degree of superolateral hip joint space narrowing on the left. Mild subchondral cystic change along the superolateral acetabula. SI joints and symphysis pubis are normally spaced and aligned. Bones are demineralized. Soft tissues are unremarkable. IMPRESSION: 1. No fracture, dislocation or acute finding. Electronically Signed   By: Lajean Manes M.D.   On: 08/25/2017 11:24    Microbiology: Recent Results (from the past 240 hour(s))  Urine culture     Status: None   Collection Time: 08/25/17 10:31 AM  Result Value Ref Range Status   Specimen Description   Final    URINE, CLEAN CATCH Performed at [facility], [address], [place], [place] [address]    Special Requests   Final    NONE Performed at [facility], [address], [place], [place] [address]    Culture   Final    NO GROWTH Performed at [facility], [place] [address], [place], [place] [address]    Report Status 08/26/2017 FINAL  Final  MRSA PCR Screening     Status: None   Collection Time: 08/25/17  6:21 PM  Result Value Ref Range Status    MRSA by PCR NEGATIVE NEGATIVE Final    Comment:        The GeneXpert MRSA Assay (FDA approved for NASAL specimens only), is one component of a comprehensive MRSA colonization surveillance program. It is not intended to diagnose MRSA infection nor to guide or monitor treatment for MRSA infections. Performed at [facility], [address] [place], [place] [address]      Labs: Basic Metabolic Panel: Recent Labs  Lab 08/26/17 0539 08/27/17 0607 08/28/17 0634 08/29/17 0658 08/30/17 0524  NA 147* 149* 146* 140 140  K 5.0 4.6 4.3 4.1 4.6  CL 120* 124* 121* 114* 114*  CO2 14* 12* 18* 17* 18*  GLUCOSE 153* 138* 152* 240* 135*  BUN 96* 92* 67* 47* 38*  CREATININE 2.73* 2.63* 2.07* 1.86* 1.75*  CALCIUM 8.3* 8.3* 8.1* 8.0* 8.3*   Liver Function Tests: Recent Labs  Lab 08/25/17 1204 08/28/17 0634 08/30/17 0524  AST 355* 104* 61*  ALT 101* 64* 53  ALKPHOS 70 44 50  BILITOT 0.9 0.5 1.0  PROT 7.4 5.3* 5.6*  ALBUMIN 3.8 2.6* 2.7*   CBC: Recent Labs  Lab 08/25/17 1204 08/27/17 0607 08/28/17 0946 08/29/17 0658 08/30/17 0524  WBC 11.6* 5.6 5.4 6.0 5.8  NEUTROABS 10.2*  --   --   --   --   HGB 12.1* 8.9* 8.6* 9.0* 9.3*  HCT  39.4 29.3* 28.9* 29.4* 30.1*  MCV 88.3 88.8 90.6 89.1 88.0  PLT 216 114* 105* 110* 126*   Cardiac Enzymes: Recent Labs  Lab 08/25/17 1204 08/25/17 1444 08/25/17 2207 08/26/17 0539 08/27/17 0607 08/28/17 0634 08/29/17 0658 08/30/17 0524  CKTOTAL 21,892*  --   --  13,395* 8,539* 6,136* 4,019* 3,057*  TROPONINI 9.31* 8.03* 6.94* 4.45*  --   --   --   --     CBG: Recent Labs  Lab 08/29/17 2030 08/30/17 0022 08/30/17 0433 08/30/17 0826 08/30/17 1125  GLUCAP 130* 107* 111* 106* 181*    Signed:  Barton Dubois MD.  [facility] 08/30/2017, 2:25 PM

## 2017-08-30 NOTE — Plan of Care (Signed)
progressing 

## 2017-08-30 NOTE — Progress Notes (Signed)
  Speech Language Pathology Treatment: Dysphagia  Patient Details Name: Paul Perez MRN: [id number] DOB: [birth year] Today's Date: 08/30/2017 Time: 3893-7342 SLP Time Calculation (min) (ACUTE ONLY): 25 min  Assessment / Plan / Recommendation Clinical Impression  Pt seen for ongoing dysphagia intervention in room. Pt alert and upright. Mit removed from right hand to allow for self feeding with set up assist. Pt reportedly pulled his IV yesterday. Pt able to self feed peanut butter crackers and straw sips thin water with min assist. Pt required verbal cues to alternate with sips of liquids to help clear oral residuals noted with peanut butter crackers. Pt with one episode of delayed coughing following regular textures. Pt reports reduced appetite and states he is ready to go home and work out in the yard. D/C from acute SLP services at this time with f/u at SNF. Continue D3/mech soft and thin liquids with aspiration and reflux precautions.   HPI HPI: Paul Perez is a 84 y.o. male with medical history significant for diabetes mellitus, dementia and hypertension who was last seen in the night before on his riding mower mowing the yard.  He was reportedly found by his brother on the ground and the patient was noted to be shaking with complaints of pain.  He is brought to the emergency department with hypothermia and is admitted with rhabdomyolysis along with AK I on CKD stage III secondarily.  He has been placed on aggressive IV fluid hydration with improvement in rhabdomyolysis noted thus far.      SLP Plan  Continue with current plan of care       Recommendations  Diet recommendations: Dysphagia 3 (mechanical soft);Thin liquid Liquids provided via: Cup;Straw Medication Administration: Whole meds with liquid Supervision: Staff to assist with self feeding;Full supervision/cueing for compensatory strategies Compensations: Slow rate;Small sips/bites;Lingual sweep for clearance of  pocketing Postural Changes and/or Swallow Maneuvers: Seated upright 90 degrees;Upright 30-60 min after meal                Oral Care Recommendations: Oral care BID;Oral care before and after PO;Staff/trained caregiver to provide oral care Follow up Recommendations: 24 hour supervision/assistance;Skilled Nursing facility SLP Visit Diagnosis: Dysphagia, unspecified (R13.10) Plan: Continue with current plan of care       Thank you,  Genene Churn, [place]                 [place] 08/30/2017, 11:18 AM

## 2017-08-30 NOTE — Clinical Social Work Placement (Signed)
   CLINICAL SOCIAL WORK PLACEMENT  NOTE  Date:  08/30/2017  Patient Details  Name: Paul Perez MRN: [id number] Date of Birth: [birth year]  Clinical Social Work is seeking post-discharge placement for this patient at the [place] level of care (*CSW will initial, date and re-position this form in  chart as items are completed):  Yes   Patient/family provided with [facility] Work Department's list of facilities offering this level of care within the geographic area requested by the patient (or if unable, by the patient's family).  Yes   Patient/family informed of their freedom to choose among providers that offer the needed level of care, that participate in Medicare, Medicaid or managed care program needed by the patient, have an available bed and are willing to accept the patient.  Yes   Patient/family informed of [facility]'s ownership interest in [facility] and [facility], as well as of the fact that they are under no obligation to receive care at these facilities.  PASRR submitted to EDS on 08/29/17     PASRR number received on 08/29/17     Existing PASRR number confirmed on       FL2 transmitted to all facilities in geographic area requested by pt/family on 08/29/17     FL2 transmitted to all facilities within larger geographic area on       Patient informed that his/her managed care company has contracts with or will negotiate with certain facilities, including the following:        Yes   Patient/family informed of bed offers received.  Patient chooses bed at [facility]     Physician recommends and patient chooses bed at      Patient to be transferred to [facility] on 08/30/17.  Patient to be transferred to facility by [facility] staff     Patient family notified on 08/30/17 of transfer.  Name of family member notified:  dtr [name] was notified previously of discharge today.     PHYSICIAN       Additional  Comment:  Facility notified, discharge clincials sent. LCSW signing off.   _______________________________________________ Ihor Gully, LCSW 08/30/2017, 2:48 PM

## 2017-08-30 NOTE — Care Management Important Message (Signed)
Important Message  Patient Details  Name: Paul Perez MRN: [id number] Date of Birth: 11-30-1927   Medicare Important Message Given:  Yes    Paul Perez, Chauncey Reading, RN 08/30/2017, 9:49 AM

## 2017-08-30 NOTE — Progress Notes (Signed)
Removed IV-clean, dry, intact. Called report to [place] at [facility]. Paul Perez and I wheeled stable pt to [facility].

## 2017-08-31 ENCOUNTER — Telehealth: Payer: Self-pay | Admitting: *Deleted

## 2017-08-31 ENCOUNTER — Non-Acute Institutional Stay (SKILLED_NURSING_FACILITY): Payer: Medicare Other | Admitting: Internal Medicine

## 2017-08-31 ENCOUNTER — Encounter: Payer: Self-pay | Admitting: Internal Medicine

## 2017-08-31 DIAGNOSIS — R001 Bradycardia, unspecified: Secondary | ICD-10-CM

## 2017-08-31 DIAGNOSIS — E785 Hyperlipidemia, unspecified: Secondary | ICD-10-CM

## 2017-08-31 DIAGNOSIS — E870 Hyperosmolality and hypernatremia: Secondary | ICD-10-CM

## 2017-08-31 DIAGNOSIS — M6282 Rhabdomyolysis: Secondary | ICD-10-CM

## 2017-08-31 DIAGNOSIS — N183 Chronic kidney disease, stage 3 (moderate): Secondary | ICD-10-CM

## 2017-08-31 DIAGNOSIS — R778 Other specified abnormalities of plasma proteins: Secondary | ICD-10-CM

## 2017-08-31 DIAGNOSIS — I10 Essential (primary) hypertension: Secondary | ICD-10-CM

## 2017-08-31 DIAGNOSIS — G9340 Encephalopathy, unspecified: Secondary | ICD-10-CM | POA: Diagnosis not present

## 2017-08-31 DIAGNOSIS — R7989 Other specified abnormal findings of blood chemistry: Secondary | ICD-10-CM

## 2017-08-31 DIAGNOSIS — E1165 Type 2 diabetes mellitus with hyperglycemia: Secondary | ICD-10-CM

## 2017-08-31 DIAGNOSIS — IMO0002 Reserved for concepts with insufficient information to code with codable children: Secondary | ICD-10-CM

## 2017-08-31 DIAGNOSIS — E118 Type 2 diabetes mellitus with unspecified complications: Secondary | ICD-10-CM

## 2017-08-31 DIAGNOSIS — N179 Acute kidney failure, unspecified: Secondary | ICD-10-CM

## 2017-08-31 DIAGNOSIS — R748 Abnormal levels of other serum enzymes: Secondary | ICD-10-CM

## 2017-08-31 NOTE — Progress Notes (Signed)
Provider: Veleta Miners  Location:   [place] Room Number: 128/P Place of Service:  SNF (31)  PCP: Jani Gravel, MD Patient Care Team: Jani Gravel, MD as PCP - General (Internal Medicine)  Extended Emergency Contact Information Primary Emergency Contact: [place], [place] [address] Johnnette Litter of [place] Phone: [phone] Mobile Phone: [phone] Relation: Daughter Secondary Emergency Contact: Wynn Maudlin Address: [address]           [place], [place] [address] Johnnette Litter of [place] Phone: [phone] Mobile Phone: [phone] Relation: Son  Code Status: Full Code Goals of Care: Advanced Directive information Advanced Directives 08/31/2017  Does Patient Have a Medical Advance Directive? Yes  Type of Advance Directive (No Data)  Does patient want to make changes to medical advance directive? No - Patient declined  Would patient like information on creating a medical advance directive? No - Patient declined  Pre-existing out of facility DNR order (yellow form or pink MOST form) -      Chief Complaint  Patient presents with  . New Admit To SNF    New Admission Visit    HPI: Patient is a 84 y.o. male seen today for admission to SNF for therapy after staying in the hospital from 04/12-04/17 Patient Has h/o Hypertension, CKD, Diabetes Mellitus And dementia.  Patient unable to give me any history due to his dementia.  Per his discharge summary patient was found by his brother on the ground with a riding mower in the woods.  It seems like he has been outside all night.  Patient does not remember anything.  In the hospital his CT scan of the head did not show any acute changes just atrophy .  He does have moderate to severe spinal stenosis on his cervical spine CT.  He also was hypernatremic with sodium of 146.  Creatinine was 2.7 and had elevated troponin of 9.31 with CK of 22,000. He received IV fluids which improved his  rhabdomyolysis and creatinine and Sodium came down to his baseline.  Cardiology Saw him and don't think he had Acute cardiac Event.They did recommend an event monitor due to his  Intermittent bradycardia He also was seen by speech and started on dysphagia 3 diet. Per Family this is not his baseline.  He was driving and was more functional at home.  He lives by himself and his son helps him. He has been very confused in the facility.  And has had 2 falls.  When I went to the room he had his shirt off.  He does not remember that he was in the hospital and now is in rehab center.    Past Medical History:  Diagnosis Date  . Dementia   . Diabetes mellitus without complication ([place])   . Encephalopathy   . Gastritis 2004  . GIB (gastrointestinal bleeding) 2004  . Hypertension   . Poor historian   . Sinus bradycardia seen on cardiac monitor    Past Surgical History:  Procedure Laterality Date  . INGUINAL HERNIA REPAIR  09/2010   Bilateral  . UPPER GASTROINTESTINAL ENDOSCOPY  2004    reports that he has never smoked. He has never used smokeless tobacco. He reports that he does not drink alcohol or use drugs. Social History   Socioeconomic History  . Marital status: Widowed    Spouse name: Not on file  . Number of children: Not on file  . Years of education:  Not on file  . Highest education level: Not on file  Occupational History  . Not on file  Social Needs  . Financial resource strain: Not on file  . Food insecurity:    Worry: Not on file    Inability: Not on file  . Transportation needs:    Medical: Not on file    Non-medical: Not on file  Tobacco Use  . Smoking status: Never Smoker  . Smokeless tobacco: Never Used  Substance and Sexual Activity  . Alcohol use: No  . Drug use: No  . Sexual activity: Not on file  Lifestyle  . Physical activity:    Days per week: Not on file    Minutes per session: Not on file  . Stress: Not on file  Relationships  . Social  connections:    Talks on phone: Not on file    Gets together: Not on file    Attends religious service: Not on file    Active member of club or organization: Not on file    Attends meetings of clubs or organizations: Not on file    Relationship status: Not on file  . Intimate partner violence:    Fear of current or ex partner: Not on file    Emotionally abused: Not on file    Physically abused: Not on file    Forced sexual activity: Not on file  Other Topics Concern  . Not on file  Social History Narrative  . Not on file    Functional Status Survey:    Family History  Problem Relation Age of Onset  . Diabetes Mother   . Diabetes Sister     Health Maintenance  Topic Date Due  . FOOT EXAM  09/30/2017 (Originally 07/01/1937)  . OPHTHALMOLOGY EXAM  09/30/2017 (Originally 07/01/1937)  . TETANUS/TDAP  09/30/2017 (Originally 06/30/1946)  . PNA vac Low Risk Adult (1 of 2 - PCV13) 09/30/2017 (Originally 06/30/1994)  . INFLUENZA VACCINE  12/14/2017  . HEMOGLOBIN A1C  02/24/2018    No Known Allergies  Allergies as of 08/31/2017   No Known Allergies     Medication List        Accurate as of 08/31/17 10:19 AM. Always use your most recent med list.          amLODipine 5 MG tablet Commonly known as:  NORVASC Take 5 mg by mouth daily.   lisinopril 20 MG tablet Commonly known as:  PRINIVIL,ZESTRIL Take 1 tablet (20 mg total) by mouth daily.   meclizine 25 MG tablet Commonly known as:  ANTIVERT Take 25 mg by mouth 3 (three) times daily as needed for dizziness.   metFORMIN 500 MG tablet Commonly known as:  GLUCOPHAGE Take 1 tablet by mouth 2 (two) times daily.   pioglitazone 15 MG tablet Commonly known as:  ACTOS Take 1 tablet by mouth daily.   polyethylene glycol packet Commonly known as:  MIRALAX / GLYCOLAX Take 17 g by mouth daily as needed for mild constipation.   simvastatin 40 MG tablet Commonly known as:  ZOCOR Take 40 mg by mouth daily.     triamterene-hydrochlorothiazide 37.5-25 MG tablet Commonly known as:  MAXZIDE-25 Take 1 tablet by mouth daily. Start taking on:  09/02/2017   zolpidem 10 MG tablet Commonly known as:  AMBIEN Take 10 mg by mouth at bedtime as needed for sleep.       Review of Systems  Unable to perform ROS: Dementia    Vitals:   08/31/17 1019  BP: (!) 162/60  Pulse: (!) 47  Resp: 20  Temp: (!) 96.6 F (35.9 C)  TempSrc: Oral  SpO2: 97%   There is no height or weight on file to calculate BMI. Physical Exam  Constitutional: He appears well-developed and well-nourished.  HENT:  Head: Normocephalic.  Mouth/Throat: Oropharynx is clear and moist.  Eyes: Pupils are equal, round, and reactive to light.  Neck: Neck supple.  Cardiovascular: Regular rhythm and normal heart sounds. Bradycardia present.  No murmur heard. Pulmonary/Chest: Effort normal and breath sounds normal. No stridor. No respiratory distress. He has no wheezes.  Abdominal: Soft. Bowel sounds are normal. He exhibits no distension. There is no tenderness. There is no guarding.  Musculoskeletal:  Trace edema Bilateral  Lymphadenopathy:    He has no cervical adenopathy.  Neurological: He is alert.  Not oriented to place or time No Focal deficits Would Follow Commands.  Skin:  Has some redness in Right Hand  Psychiatric: He has a normal mood and affect. His behavior is normal.    Labs reviewed: Basic Metabolic Panel: Recent Labs    12/27/16 1039  08/28/17 0634 08/29/17 0658 08/30/17 0524  NA 136   < > 146* 140 140  K 4.9   < > 4.3 4.1 4.6  CL 107   < > 121* 114* 114*  CO2 23   < > 18* 17* 18*  GLUCOSE 127*   < > 152* 240* 135*  BUN 57*   < > 67* 47* 38*  CREATININE 2.70*   < > 2.07* 1.86* 1.75*  CALCIUM 8.8*   < > 8.1* 8.0* 8.3*  MG 3.4*  --   --   --   --    < > = values in this interval not displayed.   Liver Function Tests: Recent Labs    08/25/17 1204 08/28/17 0634 08/30/17 0524  AST 355* 104* 61*   ALT 101* 64* 53  ALKPHOS 70 44 50  BILITOT 0.9 0.5 1.0  PROT 7.4 5.3* 5.6*  ALBUMIN 3.8 2.6* 2.7*   No results for input(s): LIPASE, AMYLASE in the last 8760 hours. No results for input(s): AMMONIA in the last 8760 hours. CBC: Recent Labs    12/27/16 1039 08/25/17 1204  08/28/17 0946 08/29/17 0658 08/30/17 0524  WBC 5.7 11.6*   < > 5.4 6.0 5.8  NEUTROABS 4.0 10.2*  --   --   --   --   HGB 9.9* 12.1*   < > 8.6* 9.0* 9.3*  HCT 30.7* 39.4   < > 28.9* 29.4* 30.1*  MCV 84.8 88.3   < > 90.6 89.1 88.0  PLT 147* 216   < > 105* 110* 126*   < > = values in this interval not displayed.   Cardiac Enzymes: Recent Labs    08/25/17 1444 08/25/17 2207 08/26/17 0539  08/28/17 0634 08/29/17 0658 08/30/17 0524  CKTOTAL  --   --  13,395*   < > 6,136* 4,019* 3,057*  TROPONINI 8.03* 6.94* 4.45*  --   --   --   --    < > = values in this interval not displayed.   BNP: Invalid input(s): POCBNP Lab Results  Component Value Date   HGBA1C 6.4 (H) 08/25/2017   Lab Results  Component Value Date   TSH 1.690 08/25/2017   Lab Results  Component Value Date   VITAMINB12 288 08/22/2013   No results found for: FOLATE No results found for: IRON, TIBC, FERRITIN  Imaging and  Procedures obtained prior to SNF admission: Dg Chest 1 View  Result Date: 08/25/2017 CLINICAL DATA:  Confusion.  Found outside.  History of dementia. EXAM: CHEST  1 VIEW COMPARISON:  07/24/2014 FINDINGS: The cardiac silhouette is normal in size. The thoracic aorta is mildly tortuous with atherosclerotic calcification noted. No airspace consolidation, edema, pleural effusion, or pneumothorax is identified. A 1.7 cm ovoid density projecting below the right hemidiaphragm was not clearly present on the prior study and could reflect a calcified granuloma in the liver or posterior costophrenic sulcus. No acute osseous abnormality is seen. IMPRESSION: No active disease. Electronically Signed   By: Logan Bores M.D.   On: 08/25/2017  11:25   Ct Head Wo Contrast  Result Date: 08/25/2017 CLINICAL DATA:  84 year old who was found outside at his house after laying on the ground all might. Hypothermia. EXAM: CT HEAD WITHOUT CONTRAST CT CERVICAL SPINE WITHOUT CONTRAST TECHNIQUE: Multidetector CT imaging of the head and cervical spine was performed following the standard protocol without intravenous contrast. Multiplanar CT image reconstructions of the cervical spine were also generated. COMPARISON:  MRI brain 08/21/2013.  CT head 08/20/2013. FINDINGS: CT HEAD FINDINGS Brain: Moderate to severe age related cortical and deep atrophy, progressive since 42. Moderate chronic microvascular ischemic changes of the white matter diffusely, unchanged. No mass lesion. No midline shift. No acute hemorrhage or hematoma. No extra-axial fluid collections. No evidence of acute infarction. Incidental note of partial empty sella. Vascular: Moderate BILATERAL carotid siphon and mild BILATERAL vertebral artery atherosclerosis. No hyperdense vessel. Skull: No skull fracture or other focal osseous abnormality involving the skull. Sinuses/Orbits: Small mucous retention cyst or polyp involving the MEDIAL wall of the LEFT maxillary sinus. Minimal mucosal thickening involving the base of the frontal sinuses bilaterally. Remaining visualized paranasal sinuses, BILATERAL mastoid air cells and BILATERAL middle ear cavities well-aerated. Visualized orbits and globes normal in appearance. Other: None. CT CERVICAL SPINE FINDINGS Alignment: Anatomic POSTERIOR alignment. Straightening of the usual lordosis. Skull base and vertebrae: No fractures identified involving the cervical spine. No evidence of perched facets. Diffuse facet degenerative changes. Coronal reformatted images demonstrate an intact craniocervical junction, intact dens and intact lateral masses throughout. Lucent foci in the tip of the dens and in the MEDIAL RIGHT base of the dens likely represent degenerative  cysts/geodes, as there are no lucent lesions elsewhere in the visualized skeleton. Soft tissues and spinal canal: No evidence of paraspinous or canal hematoma. Moderate to severe multifactorial spinal stenosis at C3-4 and mild multifactorial spinal stenosis at C5-6 and C6-7. Disc levels: Disc space narrowing and endplate hypertrophic changes at every cervical level and at C7-T1. Combination of uncinate and facet hypertrophy account for multilevel foraminal stenoses including: Mild BILATERAL C2-3, severe BILATERAL C3-4, moderate BILATERAL C4-5, severe BILATERAL C5-6, severe BILATERAL C6-7, moderate LEFT C7-T1. Upper chest: Visualized lung apices clear. Visualized superior mediastinum normal. Other: Mild atherosclerosis involving the LEFT carotid artery bulb. IMPRESSION: CT Head: 1. No acute intracranial abnormality. 2. Moderate to severe generalized atrophy and chronic microvascular ischemic changes of the white matter diffusely. CT Cervical Spine: 1. No fractures identified involving the cervical spine. 2. Multilevel degenerative disc disease, spondylosis and facet degenerative changes resulting in multilevel foraminal stenoses as detailed above. 3. Moderate to severe multifactorial spinal stenosis at C3-4 and mild multifactorial spinal stenosis at C5-6 and C6-7. Electronically Signed   By: Evangeline Dakin M.D.   On: 08/25/2017 11:13   Ct Cervical Spine Wo Contrast  Result Date: 08/25/2017 CLINICAL DATA:  84 year old  who was found outside at his house after laying on the ground all might. Hypothermia. EXAM: CT HEAD WITHOUT CONTRAST CT CERVICAL SPINE WITHOUT CONTRAST TECHNIQUE: Multidetector CT imaging of the head and cervical spine was performed following the standard protocol without intravenous contrast. Multiplanar CT image reconstructions of the cervical spine were also generated. COMPARISON:  MRI brain 08/21/2013.  CT head 08/20/2013. FINDINGS: CT HEAD FINDINGS Brain: Moderate to severe age related  cortical and deep atrophy, progressive since 23. Moderate chronic microvascular ischemic changes of the white matter diffusely, unchanged. No mass lesion. No midline shift. No acute hemorrhage or hematoma. No extra-axial fluid collections. No evidence of acute infarction. Incidental note of partial empty sella. Vascular: Moderate BILATERAL carotid siphon and mild BILATERAL vertebral artery atherosclerosis. No hyperdense vessel. Skull: No skull fracture or other focal osseous abnormality involving the skull. Sinuses/Orbits: Small mucous retention cyst or polyp involving the MEDIAL wall of the LEFT maxillary sinus. Minimal mucosal thickening involving the base of the frontal sinuses bilaterally. Remaining visualized paranasal sinuses, BILATERAL mastoid air cells and BILATERAL middle ear cavities well-aerated. Visualized orbits and globes normal in appearance. Other: None. CT CERVICAL SPINE FINDINGS Alignment: Anatomic POSTERIOR alignment. Straightening of the usual lordosis. Skull base and vertebrae: No fractures identified involving the cervical spine. No evidence of perched facets. Diffuse facet degenerative changes. Coronal reformatted images demonstrate an intact craniocervical junction, intact dens and intact lateral masses throughout. Lucent foci in the tip of the dens and in the MEDIAL RIGHT base of the dens likely represent degenerative cysts/geodes, as there are no lucent lesions elsewhere in the visualized skeleton. Soft tissues and spinal canal: No evidence of paraspinous or canal hematoma. Moderate to severe multifactorial spinal stenosis at C3-4 and mild multifactorial spinal stenosis at C5-6 and C6-7. Disc levels: Disc space narrowing and endplate hypertrophic changes at every cervical level and at C7-T1. Combination of uncinate and facet hypertrophy account for multilevel foraminal stenoses including: Mild BILATERAL C2-3, severe BILATERAL C3-4, moderate BILATERAL C4-5, severe BILATERAL C5-6, severe  BILATERAL C6-7, moderate LEFT C7-T1. Upper chest: Visualized lung apices clear. Visualized superior mediastinum normal. Other: Mild atherosclerosis involving the LEFT carotid artery bulb. IMPRESSION: CT Head: 1. No acute intracranial abnormality. 2. Moderate to severe generalized atrophy and chronic microvascular ischemic changes of the white matter diffusely. CT Cervical Spine: 1. No fractures identified involving the cervical spine. 2. Multilevel degenerative disc disease, spondylosis and facet degenerative changes resulting in multilevel foraminal stenoses as detailed above. 3. Moderate to severe multifactorial spinal stenosis at C3-4 and mild multifactorial spinal stenosis at C5-6 and C6-7. Electronically Signed   By: Evangeline Dakin M.D.   On: 08/25/2017 11:13   US Abdomen Limited  Result Date: 08/25/2017 CLINICAL DATA:  Elevated liver function tests. EXAM: ULTRASOUND ABDOMEN LIMITED RIGHT UPPER QUADRANT COMPARISON:  05/11/2006 CT abdomen/pelvis. FINDINGS: Gallbladder: Gallbladder is mildly distended. There are two nonmobile faintly shadowing gallstones in the gallbladder neck measuring up to 7 mm. No gallbladder wall thickening. No pericholecystic fluid. By report, technologist is unable to assess for sonographic Murphy sign given history of dementia. Common bile duct: Diameter: 4 mm Liver: Simple 1.9 cm anterior left liver lobe cyst. No additional focal liver lesions. Liver parenchymal echogenicity and echotexture are within normal limits. Portal vein is patent on color Doppler imaging with normal direction of blood flow towards the liver. IMPRESSION: 1. Cholelithiasis. Mildly distended gallbladder. No gallbladder wall thickening. No pericholecystic fluid. Unable to assess for sonographic Murphy sign given patient's mental status. If there is  clinical concern for acute cholecystitis, consider hepatobiliary scintigraphy study. 2. No biliary ductal dilatation. 3. Small simple left liver lobe cyst.  No  suspicious liver lesions. Electronically Signed   By: Ilona Sorrel M.D.   On: 08/25/2017 13:54   Dg Hips Bilat With Pelvis Min 5 Views  Result Date: 08/25/2017 CLINICAL DATA:  CONFUSION, PT WAS FOUND OUTSIDE BY BROTHER THIS AM, DEMENTIA, HTN, NON SMOKER, PT WAS UNABLE TO GIVE ANY OTHER INFORMATION EXAM: DG HIP (WITH OR WITHOUT PELVIS) 5+V BILAT COMPARISON:  None. FINDINGS: No fracture or dislocation.  No bone lesion. There is left greater than right concentric hip joint space narrowing with a greater degree of superolateral hip joint space narrowing on the left. Mild subchondral cystic change along the superolateral acetabula. SI joints and symphysis pubis are normally spaced and aligned. Bones are demineralized. Soft tissues are unremarkable. IMPRESSION: 1. No fracture, dislocation or acute finding. Electronically Signed   By: Lajean Manes M.D.   On: 08/25/2017 11:24    Assessment/Plan  Essential hypertension Patient's  Is on  amlodipine, lisinopril, Maxzide. His blood pressure was mildly elevated today We will continue to monitor  Acute encephalopathy Patient does have baseline dementia with worsening with this hospitalization. He did have similar episode 2 years ago from which he recovered. Will start therapy in Facility. Family aware that he would need more supervision on Discharge. Patient [place] 2 level [place] He is at high risk for Falls. Would Consider Aricept in few days to see if it helps his behavior.   Diabetes mellitus type 2,  Patient is on Actos and Metformin BS mostly below 200 A1C Was 6.4 in hospital.  Non-traumatic rhabdomyolysis CK peaked at [place] down with hydration  Acute renal failure superimposed on stage 3 CKD Creat elevated but close to baseline On Maxzide and Lisinopril. Was restarted in the hospital. Will Follow renal function. Hyperlipidemia, Continue Zocor  Elevated troponin Per cardiology it was not related to any  acute cardiac event. His 2D echo was normal with a EF of 60% Bradycardia He does have intermittent bradycardia.  Though he stayed asymptomatic in the hospital.  Cardiology is planning to do event monitor He has appointment to follow-up with them  Acute hypernatremia Resolved with hydration Will repeat BMP Dementia Once he stabilizes in the facility we will consider starting him on Aricept. At this time I will go to discontinue Ambien Dysphagia Patient is on D3 diet.  Will follow with speech   Family/ staff Communication:   Labs/tests ordered: CBC, CK,  CMP  Total time spent in this patient care encounter was 45_ minutes; greater than 50% of the visit spent counseling patient, reviewing records , Labs and coordinating care for problems addressed at this encounter.

## 2017-08-31 NOTE — Telephone Encounter (Signed)
-----   Message from Erma Heritage, [place] sent at 08/29/2017 11:14 AM EDT ----- Regarding: 14-day event monitor Hi Kisha,   This patient will need a 14-day event monitor at the time of hospital discharge (should be within the next 1-2 days) for bradycardia. Domenic Polite is his Film/video editor.   Thanks,  [place]

## 2017-09-04 ENCOUNTER — Encounter (HOSPITAL_COMMUNITY)
Admission: RE | Admit: 2017-09-04 | Discharge: 2017-09-04 | Disposition: A | Discharge status: Home / Self Care | Payer: Medicare Other | Source: Skilled Nursing Facility | Attending: Internal Medicine | Admitting: Internal Medicine

## 2017-09-04 DIAGNOSIS — I10 Essential (primary) hypertension: Secondary | ICD-10-CM | POA: Insufficient documentation

## 2017-09-04 LAB — CBC WITH DIFFERENTIAL/PLATELET
Basophils Absolute: 0 10*3/uL (ref 0.0–0.1)
Basophils Relative: 0 %
Eosinophils Absolute: 0.1 10*3/uL (ref 0.0–0.7)
Eosinophils Relative: 1 %
HEMATOCRIT: 29.2 % — AB (ref 39.0–52.0)
HEMOGLOBIN: 8.8 g/dL — AB (ref 13.0–17.0)
LYMPHS ABS: 0.9 10*3/uL (ref 0.7–4.0)
Lymphocytes Relative: 9 %
MCH: 26.7 pg (ref 26.0–34.0)
MCHC: 30.1 g/dL (ref 30.0–36.0)
MCV: 88.8 fL (ref 78.0–100.0)
MONO ABS: 0.6 10*3/uL (ref 0.1–1.0)
Monocytes Relative: 6 %
NEUTROS ABS: 7.8 10*3/uL — AB (ref 1.7–7.7)
NEUTROS PCT: 84 %
Platelets: 186 10*3/uL (ref 150–400)
RBC: 3.29 MIL/uL — ABNORMAL LOW (ref 4.22–5.81)
RDW: 14.7 % (ref 11.5–15.5)
WBC: 9.4 10*3/uL (ref 4.0–10.5)

## 2017-09-04 LAB — COMPREHENSIVE METABOLIC PANEL
ALK PHOS: 54 U/L (ref 38–126)
ALT: 34 U/L (ref 17–63)
ANION GAP: 10 (ref 5–15)
AST: 26 U/L (ref 15–41)
Albumin: 3 g/dL — ABNORMAL LOW (ref 3.5–5.0)
BILIRUBIN TOTAL: 0.7 mg/dL (ref 0.3–1.2)
BUN: 37 mg/dL — ABNORMAL HIGH (ref 6–20)
CALCIUM: 8.6 mg/dL — AB (ref 8.9–10.3)
CO2: 21 mmol/L — ABNORMAL LOW (ref 22–32)
Chloride: 109 mmol/L (ref 101–111)
Creatinine, Ser: 1.97 mg/dL — ABNORMAL HIGH (ref 0.61–1.24)
GFR, EST AFRICAN AMERICAN: 33 mL/min — AB (ref >60)
GFR, EST NON AFRICAN AMERICAN: 29 mL/min — AB (ref >60)
Glucose, Bld: 133 mg/dL — ABNORMAL HIGH (ref 65–99)
Potassium: 4.8 mmol/L (ref 3.5–5.1)
Sodium: 140 mmol/L (ref 135–145)
TOTAL PROTEIN: 5.8 g/dL — AB (ref 6.5–8.1)

## 2017-09-04 LAB — CK: CK TOTAL: 840 U/L — AB (ref 49–397)

## 2017-09-09 ENCOUNTER — Encounter (HOSPITAL_COMMUNITY)
Admission: RE | Admit: 2017-09-09 | Discharge: 2017-09-09 | Disposition: A | Discharge status: Home / Self Care | Payer: Medicare Other | Source: Skilled Nursing Facility | Attending: Internal Medicine | Admitting: Internal Medicine

## 2017-09-09 LAB — BASIC METABOLIC PANEL
Anion gap: 11 (ref 5–15)
BUN: 47 mg/dL — AB (ref 6–20)
CALCIUM: 9 mg/dL (ref 8.9–10.3)
CHLORIDE: 108 mmol/L (ref 101–111)
CO2: 20 mmol/L — ABNORMAL LOW (ref 22–32)
CREATININE: 2.36 mg/dL — AB (ref 0.61–1.24)
GFR calc non Af Amer: 23 mL/min — ABNORMAL LOW (ref >60)
GFR, EST AFRICAN AMERICAN: 27 mL/min — AB (ref >60)
Glucose, Bld: 145 mg/dL — ABNORMAL HIGH (ref 65–99)
Potassium: 4.8 mmol/L (ref 3.5–5.1)
SODIUM: 139 mmol/L (ref 135–145)

## 2017-09-11 ENCOUNTER — Encounter: Payer: Self-pay | Admitting: Internal Medicine

## 2017-09-11 ENCOUNTER — Encounter (HOSPITAL_COMMUNITY)
Admission: RE | Admit: 2017-09-11 | Discharge: 2017-09-11 | Disposition: A | Discharge status: Home / Self Care | Payer: Medicare Other | Source: Skilled Nursing Facility | Attending: Internal Medicine | Admitting: Internal Medicine

## 2017-09-11 ENCOUNTER — Non-Acute Institutional Stay (SKILLED_NURSING_FACILITY): Payer: Medicare Other | Admitting: Internal Medicine

## 2017-09-11 DIAGNOSIS — E118 Type 2 diabetes mellitus with unspecified complications: Secondary | ICD-10-CM | POA: Diagnosis not present

## 2017-09-11 DIAGNOSIS — N183 Chronic kidney disease, stage 3 (moderate): Secondary | ICD-10-CM | POA: Diagnosis not present

## 2017-09-11 DIAGNOSIS — N179 Acute kidney failure, unspecified: Secondary | ICD-10-CM | POA: Diagnosis not present

## 2017-09-11 DIAGNOSIS — E785 Hyperlipidemia, unspecified: Secondary | ICD-10-CM | POA: Diagnosis not present

## 2017-09-11 DIAGNOSIS — E1165 Type 2 diabetes mellitus with hyperglycemia: Secondary | ICD-10-CM

## 2017-09-11 DIAGNOSIS — IMO0002 Reserved for concepts with insufficient information to code with codable children: Secondary | ICD-10-CM

## 2017-09-11 DIAGNOSIS — G9340 Encephalopathy, unspecified: Secondary | ICD-10-CM

## 2017-09-11 LAB — IRON AND TIBC
Iron: 46 ug/dL (ref 45–182)
Saturation Ratios: 13 % — ABNORMAL LOW (ref 17.9–39.5)
TIBC: 344 ug/dL (ref 250–450)
UIBC: 298 ug/dL

## 2017-09-11 LAB — CBC
HEMATOCRIT: 27.8 % — AB (ref 39.0–52.0)
Hemoglobin: 8.5 g/dL — ABNORMAL LOW (ref 13.0–17.0)
MCH: 27.2 pg (ref 26.0–34.0)
MCHC: 30.6 g/dL (ref 30.0–36.0)
MCV: 88.8 fL (ref 78.0–100.0)
Platelets: 251 10*3/uL (ref 150–400)
RBC: 3.13 MIL/uL — ABNORMAL LOW (ref 4.22–5.81)
RDW: 14.8 % (ref 11.5–15.5)
WBC: 6.2 10*3/uL (ref 4.0–10.5)

## 2017-09-11 LAB — BASIC METABOLIC PANEL
Anion gap: 8 (ref 5–15)
BUN: 56 mg/dL — AB (ref 6–20)
CO2: 22 mmol/L (ref 22–32)
CREATININE: 2.55 mg/dL — AB (ref 0.61–1.24)
Calcium: 9 mg/dL (ref 8.9–10.3)
Chloride: 109 mmol/L (ref 101–111)
GFR calc Af Amer: 24 mL/min — ABNORMAL LOW (ref >60)
GFR, EST NON AFRICAN AMERICAN: 21 mL/min — AB (ref >60)
GLUCOSE: 118 mg/dL — AB (ref 65–99)
Potassium: 5.2 mmol/L — ABNORMAL HIGH (ref 3.5–5.1)
Sodium: 139 mmol/L (ref 135–145)

## 2017-09-11 LAB — VITAMIN B12: VITAMIN B 12: 588 pg/mL (ref 180–914)

## 2017-09-11 LAB — FOLATE: Folate: 15.4 ng/mL (ref >5.9)

## 2017-09-11 NOTE — Progress Notes (Signed)
Location:   [facility] Room Number: 128/P Place of Service:  SNF (31) Provider:  Yetta Numbers, MD  Patient Care Team: Jani Gravel, MD as PCP - General (Internal Medicine)  Extended Emergency Contact Information Primary Emergency Contact: [place], [place] [address] Johnnette Litter of [place] Phone: [phone] Mobile Phone: [phone] Relation: Daughter Secondary Emergency Contact: Wynn Maudlin Address: [address]           [place], [place] [address] Johnnette Litter of [place] Phone: [phone] Mobile Phone: [phone] Relation: Son  Code Status:  Full Code Goals of care: Advanced Directive information Advanced Directives 09/11/2017  Does Patient Have a Medical Advance Directive? Yes  Type of Advance Directive (No Data)  Does patient want to make changes to medical advance directive? No - Patient declined  Would patient like information on creating a medical advance directive? No - Patient declined  Pre-existing out of facility DNR order (yellow form or pink MOST form) -     Chief Complaint  Patient presents with  . Acute Visit    Worsening renal function    HPI:  Pt is a 84 y.o. male seen today for an acute visit for  worsening renal function.  Patient was admitted to SNF for therapy after staying in the hospital from 04/12-04/17 Patient Has h/o Hypertension, CKD, Diabetes Mellitus And dementia. Patient is unable to give me any history due to his dementia Patient was admitted in the hospital when he was found outside his house. He also was hypernatremic with sodium of 146.  Creatinine was 2.7 and had elevated troponin of 9.31 with CK of 22,000. He received IV fluids which improved his rhabdomyolysis and creatinine and Sodium came down to his baseline.  Creatinine in the hospital was down to 1.75.  His lisinopril and MaxZide was started on discharge.  In the facility the creatinine has been slowly going up  and is now up to 2.5 with BUN of 55. Patient is eating well.  Does not have any diarrhea, nausea or vomiting.  He has adjusted not to the facility.  And is working with therapy.  He continues to be very confused.  Past Medical History:  Diagnosis Date  . Dementia   . Diabetes mellitus without complication ([place])   . Encephalopathy   . Gastritis 2004  . GIB (gastrointestinal bleeding) 2004  . Hypertension   . Poor historian   . Sinus bradycardia seen on cardiac monitor    Past Surgical History:  Procedure Laterality Date  . INGUINAL HERNIA REPAIR  09/2010   Bilateral  . UPPER GASTROINTESTINAL ENDOSCOPY  2004    No Known Allergies  Outpatient Encounter Medications as of 09/11/2017  Medication Sig  . amLODipine (NORVASC) 5 MG tablet Take 5 mg by mouth daily.  Marland Kitchen lisinopril (PRINIVIL,ZESTRIL) 20 MG tablet Take 1 tablet (20 mg total) by mouth daily.  . pioglitazone (ACTOS) 15 MG tablet Take 1 tablet by mouth daily.  . polyethylene glycol (MIRALAX / GLYCOLAX) packet Take 17 g by mouth daily as needed for mild constipation.  . simvastatin (ZOCOR) 40 MG tablet Take 20 mg by mouth daily.   . [DISCONTINUED] meclizine (ANTIVERT) 25 MG tablet Take 25 mg by mouth 3 (three) times daily as needed for dizziness.  . [DISCONTINUED] metFORMIN (GLUCOPHAGE) 500 MG tablet Take 1 tablet by mouth 2 (two) times daily.  . [DISCONTINUED] triamterene-hydrochlorothiazide (MAXZIDE-25) 37.5-25 MG tablet Take 1 tablet  by mouth daily.  . [DISCONTINUED] zolpidem (AMBIEN) 10 MG tablet Take 10 mg by mouth at bedtime as needed for sleep.   No facility-administered encounter medications on file as of 09/11/2017.      Review of Systems  Unable to perform ROS: Dementia     There is no immunization history on file for this patient. Pertinent  Health Maintenance Due  Topic Date Due  . FOOT EXAM  09/30/2017 (Originally 07/01/1937)  . OPHTHALMOLOGY EXAM  09/30/2017 (Originally 07/01/1937)  . PNA vac Low Risk Adult (1  of 2 - PCV13) 09/30/2017 (Originally 06/30/1994)  . URINE MICROALBUMIN  10/11/2017 (Originally 07/01/1937)  . INFLUENZA VACCINE  12/14/2017  . HEMOGLOBIN A1C  02/24/2018   No flowsheet data found. Functional Status Survey:    Vitals:   09/11/17 1113  BP: (!) 131/52  Pulse: (!) 52  Resp: 20  Temp: 98 F (36.7 C)  TempSrc: Oral   There is no height or weight on file to calculate BMI. Physical Exam  Constitutional: He appears well-developed and well-nourished.  HENT:  Head: Normocephalic.  Mouth/Throat: Oropharynx is clear and moist.  Eyes: Pupils are equal, round, and reactive to light.  Neck: Normal range of motion.  Cardiovascular: Normal rate and regular rhythm.  No murmur heard. Pulmonary/Chest: Effort normal and breath sounds normal. No stridor. No respiratory distress.  Abdominal: Soft. Bowel sounds are normal. He exhibits no distension. There is no tenderness. There is no guarding.  Musculoskeletal:  Mild edema Bilateral Left more then right  Lymphadenopathy:    He has no cervical adenopathy.  Neurological: He is alert.  Skin: Skin is warm and dry.  Psychiatric: He has a normal mood and affect. His behavior is normal.    Labs reviewed: Recent Labs    12/27/16 1039  09/04/17 0529 09/09/17 0903 09/11/17 0646  NA 136   < > 140 139 139  K 4.9   < > 4.8 4.8 5.2*  CL 107   < > 109 108 109  CO2 23   < > 21* 20* 22  GLUCOSE 127*   < > 133* 145* 118*  BUN 57*   < > 37* 47* 56*  CREATININE 2.70*   < > 1.97* 2.36* 2.55*  CALCIUM 8.8*   < > 8.6* 9.0 9.0  MG 3.4*  --   --   --   --    < > = values in this interval not displayed.   Recent Labs    08/28/17 0634 08/30/17 0524 09/04/17 0529  AST 104* 61* 26  ALT 64* 53 34  ALKPHOS 44 50 54  BILITOT 0.5 1.0 0.7  PROT 5.3* 5.6* 5.8*  ALBUMIN 2.6* 2.7* 3.0*   Recent Labs    12/27/16 1039 08/25/17 1204  08/30/17 0524 09/04/17 0529 09/11/17 0646  WBC 5.7 11.6*   < > 5.8 9.4 6.2  NEUTROABS 4.0 10.2*  --   --   7.8*  --   HGB 9.9* 12.1*   < > 9.3* 8.8* 8.5*  HCT 30.7* 39.4   < > 30.1* 29.2* 27.8*  MCV 84.8 88.3   < > 88.0 88.8 88.8  PLT 147* 216   < > 126* 186 251   < > = values in this interval not displayed.   Lab Results  Component Value Date   TSH 1.690 08/25/2017   Lab Results  Component Value Date   HGBA1C 6.4 (H) 08/25/2017   Lab Results  Component Value Date  CHOL 250 (H) 08/21/2013   HDL 69 08/21/2013   LDLCALC 169 (H) 08/21/2013   TRIG 59 08/21/2013   CHOLHDL 3.6 08/21/2013    Significant Diagnostic Results in last 30 days:  Dg Chest 1 View  Result Date: 08/25/2017 CLINICAL DATA:  Confusion.  Found outside.  History of dementia. EXAM: CHEST  1 VIEW COMPARISON:  07/24/2014 FINDINGS: The cardiac silhouette is normal in size. The thoracic aorta is mildly tortuous with atherosclerotic calcification noted. No airspace consolidation, edema, pleural effusion, or pneumothorax is identified. A 1.7 cm ovoid density projecting below the right hemidiaphragm was not clearly present on the prior study and could reflect a calcified granuloma in the liver or posterior costophrenic sulcus. No acute osseous abnormality is seen. IMPRESSION: No active disease. Electronically Signed   By: Logan Bores M.D.   On: 08/25/2017 11:25   Ct Head Wo Contrast  Result Date: 08/25/2017 CLINICAL DATA:  84 year old who was found outside at his house after laying on the ground all might. Hypothermia. EXAM: CT HEAD WITHOUT CONTRAST CT CERVICAL SPINE WITHOUT CONTRAST TECHNIQUE: Multidetector CT imaging of the head and cervical spine was performed following the standard protocol without intravenous contrast. Multiplanar CT image reconstructions of the cervical spine were also generated. COMPARISON:  MRI brain 08/21/2013.  CT head 08/20/2013. FINDINGS: CT HEAD FINDINGS Brain: Moderate to severe age related cortical and deep atrophy, progressive since 62. Moderate chronic microvascular ischemic changes of the white  matter diffusely, unchanged. No mass lesion. No midline shift. No acute hemorrhage or hematoma. No extra-axial fluid collections. No evidence of acute infarction. Incidental note of partial empty sella. Vascular: Moderate BILATERAL carotid siphon and mild BILATERAL vertebral artery atherosclerosis. No hyperdense vessel. Skull: No skull fracture or other focal osseous abnormality involving the skull. Sinuses/Orbits: Small mucous retention cyst or polyp involving the MEDIAL wall of the LEFT maxillary sinus. Minimal mucosal thickening involving the base of the frontal sinuses bilaterally. Remaining visualized paranasal sinuses, BILATERAL mastoid air cells and BILATERAL middle ear cavities well-aerated. Visualized orbits and globes normal in appearance. Other: None. CT CERVICAL SPINE FINDINGS Alignment: Anatomic POSTERIOR alignment. Straightening of the usual lordosis. Skull base and vertebrae: No fractures identified involving the cervical spine. No evidence of perched facets. Diffuse facet degenerative changes. Coronal reformatted images demonstrate an intact craniocervical junction, intact dens and intact lateral masses throughout. Lucent foci in the tip of the dens and in the MEDIAL RIGHT base of the dens likely represent degenerative cysts/geodes, as there are no lucent lesions elsewhere in the visualized skeleton. Soft tissues and spinal canal: No evidence of paraspinous or canal hematoma. Moderate to severe multifactorial spinal stenosis at C3-4 and mild multifactorial spinal stenosis at C5-6 and C6-7. Disc levels: Disc space narrowing and endplate hypertrophic changes at every cervical level and at C7-T1. Combination of uncinate and facet hypertrophy account for multilevel foraminal stenoses including: Mild BILATERAL C2-3, severe BILATERAL C3-4, moderate BILATERAL C4-5, severe BILATERAL C5-6, severe BILATERAL C6-7, moderate LEFT C7-T1. Upper chest: Visualized lung apices clear. Visualized superior mediastinum  normal. Other: Mild atherosclerosis involving the LEFT carotid artery bulb. IMPRESSION: CT Head: 1. No acute intracranial abnormality. 2. Moderate to severe generalized atrophy and chronic microvascular ischemic changes of the white matter diffusely. CT Cervical Spine: 1. No fractures identified involving the cervical spine. 2. Multilevel degenerative disc disease, spondylosis and facet degenerative changes resulting in multilevel foraminal stenoses as detailed above. 3. Moderate to severe multifactorial spinal stenosis at C3-4 and mild multifactorial spinal stenosis at C5-6 and  C6-7. Electronically Signed   By: Evangeline Dakin M.D.   On: 08/25/2017 11:13   Ct Cervical Spine Wo Contrast  Result Date: 08/25/2017 CLINICAL DATA:  84 year old who was found outside at his house after laying on the ground all might. Hypothermia. EXAM: CT HEAD WITHOUT CONTRAST CT CERVICAL SPINE WITHOUT CONTRAST TECHNIQUE: Multidetector CT imaging of the head and cervical spine was performed following the standard protocol without intravenous contrast. Multiplanar CT image reconstructions of the cervical spine were also generated. COMPARISON:  MRI brain 08/21/2013.  CT head 08/20/2013. FINDINGS: CT HEAD FINDINGS Brain: Moderate to severe age related cortical and deep atrophy, progressive since 11. Moderate chronic microvascular ischemic changes of the white matter diffusely, unchanged. No mass lesion. No midline shift. No acute hemorrhage or hematoma. No extra-axial fluid collections. No evidence of acute infarction. Incidental note of partial empty sella. Vascular: Moderate BILATERAL carotid siphon and mild BILATERAL vertebral artery atherosclerosis. No hyperdense vessel. Skull: No skull fracture or other focal osseous abnormality involving the skull. Sinuses/Orbits: Small mucous retention cyst or polyp involving the MEDIAL wall of the LEFT maxillary sinus. Minimal mucosal thickening involving the base of the frontal sinuses  bilaterally. Remaining visualized paranasal sinuses, BILATERAL mastoid air cells and BILATERAL middle ear cavities well-aerated. Visualized orbits and globes normal in appearance. Other: None. CT CERVICAL SPINE FINDINGS Alignment: Anatomic POSTERIOR alignment. Straightening of the usual lordosis. Skull base and vertebrae: No fractures identified involving the cervical spine. No evidence of perched facets. Diffuse facet degenerative changes. Coronal reformatted images demonstrate an intact craniocervical junction, intact dens and intact lateral masses throughout. Lucent foci in the tip of the dens and in the MEDIAL RIGHT base of the dens likely represent degenerative cysts/geodes, as there are no lucent lesions elsewhere in the visualized skeleton. Soft tissues and spinal canal: No evidence of paraspinous or canal hematoma. Moderate to severe multifactorial spinal stenosis at C3-4 and mild multifactorial spinal stenosis at C5-6 and C6-7. Disc levels: Disc space narrowing and endplate hypertrophic changes at every cervical level and at C7-T1. Combination of uncinate and facet hypertrophy account for multilevel foraminal stenoses including: Mild BILATERAL C2-3, severe BILATERAL C3-4, moderate BILATERAL C4-5, severe BILATERAL C5-6, severe BILATERAL C6-7, moderate LEFT C7-T1. Upper chest: Visualized lung apices clear. Visualized superior mediastinum normal. Other: Mild atherosclerosis involving the LEFT carotid artery bulb. IMPRESSION: CT Head: 1. No acute intracranial abnormality. 2. Moderate to severe generalized atrophy and chronic microvascular ischemic changes of the white matter diffusely. CT Cervical Spine: 1. No fractures identified involving the cervical spine. 2. Multilevel degenerative disc disease, spondylosis and facet degenerative changes resulting in multilevel foraminal stenoses as detailed above. 3. Moderate to severe multifactorial spinal stenosis at C3-4 and mild multifactorial spinal stenosis at C5-6  and C6-7. Electronically Signed   By: Evangeline Dakin M.D.   On: 08/25/2017 11:13   US Abdomen Limited  Result Date: 08/25/2017 CLINICAL DATA:  Elevated liver function tests. EXAM: ULTRASOUND ABDOMEN LIMITED RIGHT UPPER QUADRANT COMPARISON:  05/11/2006 CT abdomen/pelvis. FINDINGS: Gallbladder: Gallbladder is mildly distended. There are two nonmobile faintly shadowing gallstones in the gallbladder neck measuring up to 7 mm. No gallbladder wall thickening. No pericholecystic fluid. By report, technologist is unable to assess for sonographic Murphy sign given history of dementia. Common bile duct: Diameter: 4 mm Liver: Simple 1.9 cm anterior left liver lobe cyst. No additional focal liver lesions. Liver parenchymal echogenicity and echotexture are within normal limits. Portal vein is patent on color Doppler imaging with normal direction of blood flow  towards the liver. IMPRESSION: 1. Cholelithiasis. Mildly distended gallbladder. No gallbladder wall thickening. No pericholecystic fluid. Unable to assess for sonographic Murphy sign given patient's mental status. If there is clinical concern for acute cholecystitis, consider hepatobiliary scintigraphy study. 2. No biliary ductal dilatation. 3. Small simple left liver lobe cyst.  No suspicious liver lesions. Electronically Signed   By: Ilona Sorrel M.D.   On: 08/25/2017 13:54   Dg Hips Bilat With Pelvis Min 5 Views  Result Date: 08/25/2017 CLINICAL DATA:  CONFUSION, PT WAS FOUND OUTSIDE BY BROTHER THIS AM, DEMENTIA, HTN, NON SMOKER, PT WAS UNABLE TO GIVE ANY OTHER INFORMATION EXAM: DG HIP (WITH OR WITHOUT PELVIS) 5+V BILAT COMPARISON:  None. FINDINGS: No fracture or dislocation.  No bone lesion. There is left greater than right concentric hip joint space narrowing with a greater degree of superolateral hip joint space narrowing on the left. Mild subchondral cystic change along the superolateral acetabula. SI joints and symphysis pubis are normally spaced and  aligned. Bones are demineralized. Soft tissues are unremarkable. IMPRESSION: 1. No fracture, dislocation or acute finding. Electronically Signed   By: Lajean Manes M.D.   On: 08/25/2017 11:24    Assessment/Plan  Acute renal failure superimposed on stage 3 CKD Creat elevated to 2.5  Discontinue both Maxzide and lisinopril Repeat BMP.  Will monitor his vital signs more frequently  Essential hypertension Patient's  Is on  amlodipine,  Will Discontinue Lisinopril and Maxzide for now. Will monitor blood pressure more frequently   Diabetes mellitus type 2,  Patient is on Actos BS mostly below 200 A1C Was 6.4 in hospital.  Non-traumatic rhabdomyolysis CK peaked at [place] down with hydration  Hyperlipidemia, Continue Zocor  Elevated troponin Per cardiology it was not related to any acute cardiac event. His 2D echo was normal with a EF of 60% Bradycardia He does have intermittent bradycardia.  Though he stayed asymptomatic in the hospital.  Cardiology is planning to do event monitor He has appointment to follow-up with them  Acute hypernatremia Resolved with hydration Dementia Once he stabilizes in the facility we will consider starting him on Aricept. At this time I will  discontinue Ambien Dysphagia Patient is on D3 diet.  Will follow with speech Acute encephalopathy Patient does have baseline dementia with worsening with this hospitalization. He did have similar episode 2 years ago from which he recovered. Will start therapy in Facility. Family aware that he would need more supervision on Discharge. Patient [place] 2 level [place] He is at high risk for Falls. Would Consider Aricept in few days to see if it helps his behavior.     Family/ staff Communication:   Labs/tests ordered:

## 2017-09-14 ENCOUNTER — Encounter (HOSPITAL_COMMUNITY)
Admission: RE | Admit: 2017-09-14 | Discharge: 2017-09-14 | Disposition: A | Discharge status: Home / Self Care | Payer: Medicare Other | Source: Skilled Nursing Facility | Attending: Internal Medicine | Admitting: Internal Medicine

## 2017-09-14 DIAGNOSIS — R41841 Cognitive communication deficit: Secondary | ICD-10-CM | POA: Insufficient documentation

## 2017-09-14 DIAGNOSIS — R195 Other fecal abnormalities: Secondary | ICD-10-CM | POA: Insufficient documentation

## 2017-09-14 DIAGNOSIS — T68XXXD Hypothermia, subsequent encounter: Secondary | ICD-10-CM | POA: Insufficient documentation

## 2017-09-14 DIAGNOSIS — R279 Unspecified lack of coordination: Secondary | ICD-10-CM | POA: Insufficient documentation

## 2017-09-14 DIAGNOSIS — I10 Essential (primary) hypertension: Secondary | ICD-10-CM | POA: Insufficient documentation

## 2017-09-14 DIAGNOSIS — R262 Difficulty in walking, not elsewhere classified: Secondary | ICD-10-CM | POA: Insufficient documentation

## 2017-09-14 LAB — BASIC METABOLIC PANEL
Anion gap: 11 (ref 5–15)
BUN: 65 mg/dL — AB (ref 6–20)
CO2: 22 mmol/L (ref 22–32)
CREATININE: 2.5 mg/dL — AB (ref 0.61–1.24)
Calcium: 9.1 mg/dL (ref 8.9–10.3)
Chloride: 106 mmol/L (ref 101–111)
GFR, EST AFRICAN AMERICAN: 25 mL/min — AB (ref >60)
GFR, EST NON AFRICAN AMERICAN: 21 mL/min — AB (ref >60)
Glucose, Bld: 129 mg/dL — ABNORMAL HIGH (ref 65–99)
POTASSIUM: 5 mmol/L (ref 3.5–5.1)
SODIUM: 139 mmol/L (ref 135–145)

## 2017-09-14 LAB — CK: CK TOTAL: 157 U/L (ref 49–397)

## 2017-09-15 ENCOUNTER — Encounter (HOSPITAL_COMMUNITY)
Admission: RE | Admit: 2017-09-15 | Discharge: 2017-09-15 | Disposition: A | Discharge status: Home / Self Care | Payer: Medicare Other | Source: Skilled Nursing Facility | Attending: Internal Medicine | Admitting: Internal Medicine

## 2017-09-15 LAB — OCCULT BLOOD X 1 CARD TO LAB, STOOL: FECAL OCCULT BLD: NEGATIVE

## 2017-09-16 ENCOUNTER — Encounter (HOSPITAL_COMMUNITY)
Admission: RE | Admit: 2017-09-16 | Discharge: 2017-09-16 | Disposition: A | Discharge status: Home / Self Care | Payer: Medicare Other | Source: Skilled Nursing Facility | Attending: *Deleted | Admitting: *Deleted

## 2017-09-16 LAB — OCCULT BLOOD X 1 CARD TO LAB, STOOL: FECAL OCCULT BLD: NEGATIVE

## 2017-09-25 ENCOUNTER — Encounter (HOSPITAL_COMMUNITY)
Admission: RE | Admit: 2017-09-25 | Discharge: 2017-09-25 | Disposition: A | Discharge status: Home / Self Care | Payer: Medicare Other | Source: Skilled Nursing Facility | Attending: Internal Medicine | Admitting: Internal Medicine

## 2017-09-25 DIAGNOSIS — I10 Essential (primary) hypertension: Secondary | ICD-10-CM | POA: Insufficient documentation

## 2017-09-25 LAB — BASIC METABOLIC PANEL
Anion gap: 7 (ref 5–15)
BUN: 59 mg/dL — AB (ref 6–20)
CO2: 25 mmol/L (ref 22–32)
CREATININE: 2.24 mg/dL — AB (ref 0.61–1.24)
Calcium: 9 mg/dL (ref 8.9–10.3)
Chloride: 108 mmol/L (ref 101–111)
GFR calc Af Amer: 28 mL/min — ABNORMAL LOW (ref >60)
GFR, EST NON AFRICAN AMERICAN: 25 mL/min — AB (ref >60)
Glucose, Bld: 131 mg/dL — ABNORMAL HIGH (ref 65–99)
Potassium: 4.8 mmol/L (ref 3.5–5.1)
SODIUM: 140 mmol/L (ref 135–145)

## 2017-09-26 ENCOUNTER — Telehealth: Payer: Self-pay

## 2017-09-26 ENCOUNTER — Encounter: Payer: Self-pay | Admitting: Internal Medicine

## 2017-09-26 ENCOUNTER — Non-Acute Institutional Stay (SKILLED_NURSING_FACILITY): Payer: Medicare Other | Admitting: Internal Medicine

## 2017-09-26 DIAGNOSIS — I5033 Acute on chronic diastolic (congestive) heart failure: Secondary | ICD-10-CM

## 2017-09-26 DIAGNOSIS — I10 Essential (primary) hypertension: Secondary | ICD-10-CM | POA: Diagnosis not present

## 2017-09-26 DIAGNOSIS — F0390 Unspecified dementia without behavioral disturbance: Secondary | ICD-10-CM

## 2017-09-26 DIAGNOSIS — IMO0002 Reserved for concepts with insufficient information to code with codable children: Secondary | ICD-10-CM

## 2017-09-26 DIAGNOSIS — E118 Type 2 diabetes mellitus with unspecified complications: Secondary | ICD-10-CM

## 2017-09-26 DIAGNOSIS — E1165 Type 2 diabetes mellitus with hyperglycemia: Secondary | ICD-10-CM

## 2017-09-26 NOTE — Progress Notes (Deleted)
Location:    [facility]   Patient Care Team: Jani Gravel, MD as PCP - General (Internal Medicine) Satira Sark, MD as PCP - Cardiology (Cardiology)  Extended Emergency Contact Information Primary Emergency Contact: [place], [place] [address] Johnnette Litter of [place] Phone: [phone] Mobile Phone: [phone] Relation: Daughter Secondary Emergency Contact: Wynn Maudlin Address: [address]           [place],  [address] Johnnette Litter of [place] Phone: [phone] Mobile Phone: [phone] Relation: Son  Code Status:  Full Code Goals of care: Advanced Directive information Advanced Directives 09/26/2017  Does Patient Have a Medical Advance Directive? Yes  Type of Advance Directive (No Data)  Does patient want to make changes to medical advance directive? No - Patient declined  Would patient like information on creating a medical advance directive? No - Patient declined  Pre-existing out of facility DNR order (yellow form or pink MOST form) -     Chief Complaint  Patient presents with  . Acute Visit    Renal Insyfficiency and Discharge Discussion    HPI:  Pt is a 84 y.o. male seen today for an acute visit for    Past Medical History:  Diagnosis Date  . Dementia   . Diabetes mellitus without complication ([place])   . Encephalopathy   . Gastritis 2004  . GIB (gastrointestinal bleeding) 2004  . Hypertension   . Poor historian   . Sinus bradycardia seen on cardiac monitor    Past Surgical History:  Procedure Laterality Date  . INGUINAL HERNIA REPAIR  09/2010   Bilateral  . UPPER GASTROINTESTINAL ENDOSCOPY  2004    No Known Allergies  Outpatient Encounter Medications as of 09/26/2017  Medication Sig  . amLODipine (NORVASC) 5 MG tablet Take 5 mg by mouth daily.  Roseanne Kaufman [place]-Castor Oil (VENELEX) OINT Apply liberal amount to coccyx, sacrum and bilateral buttocks q shift and prn after incontinence episodes for skin  protection  . pioglitazone (ACTOS) 15 MG tablet Take 1 tablet by mouth daily.  . polyethylene glycol (MIRALAX / GLYCOLAX) packet Take 17 g by mouth daily as needed for mild constipation.  . simvastatin (ZOCOR) 40 MG tablet Take 20 mg by mouth daily.   . [DISCONTINUED] lisinopril (PRINIVIL,ZESTRIL) 20 MG tablet Take 1 tablet (20 mg total) by mouth daily.   No facility-administered encounter medications on file as of 09/26/2017.      Review of Systems   There is no immunization history on file for this patient. Pertinent  Health Maintenance Due  Topic Date Due  . FOOT EXAM  09/30/2017 (Originally 07/01/1937)  . OPHTHALMOLOGY EXAM  09/30/2017 (Originally 07/01/1937)  . PNA vac Low Risk Adult (1 of 2 - PCV13) 09/30/2017 (Originally 06/30/1994)  . URINE MICROALBUMIN  10/11/2017 (Originally 07/01/1937)  . INFLUENZA VACCINE  12/14/2017  . HEMOGLOBIN A1C  02/24/2018   No flowsheet data found. Functional Status Survey:    Vitals:   09/26/17 1113  BP: (!) 143/49  Pulse: 60  Resp: 20  Temp: 98.3 F (36.8 C)  TempSrc: Oral  SpO2: 97%   There is no height or weight on file to calculate BMI. Physical Exam  Labs reviewed: Recent Labs    12/27/16 1039  09/11/17 0646 09/14/17 0708 09/25/17 0757  NA 136   < > 139 139 140  K 4.9   < > 5.2* 5.0 4.8  CL 107   < > 109 106  108  CO2 23   < > 22 22 25   GLUCOSE 127*   < > 118* 129* 131*  BUN 57*   < > 56* 65* 59*  CREATININE 2.70*   < > 2.55* 2.50* 2.24*  CALCIUM 8.8*   < > 9.0 9.1 9.0  MG 3.4*  --   --   --   --    < > = values in this interval not displayed.   Recent Labs    08/28/17 0634 08/30/17 0524 09/04/17 0529  AST 104* 61* 26  ALT 64* 53 34  ALKPHOS 44 50 54  BILITOT 0.5 1.0 0.7  PROT 5.3* 5.6* 5.8*  ALBUMIN 2.6* 2.7* 3.0*   Recent Labs    12/27/16 1039 08/25/17 1204  08/30/17 0524 09/04/17 0529 09/11/17 0646  WBC 5.7 11.6*   < > 5.8 9.4 6.2  NEUTROABS 4.0 10.2*  --   --  7.8*  --   HGB 9.9* 12.1*   < > 9.3*  8.8* 8.5*  HCT 30.7* 39.4   < > 30.1* 29.2* 27.8*  MCV 84.8 88.3   < > 88.0 88.8 88.8  PLT 147* 216   < > 126* 186 251   < > = values in this interval not displayed.   Lab Results  Component Value Date   TSH 1.690 08/25/2017   Lab Results  Component Value Date   HGBA1C 6.4 (H) 08/25/2017   Lab Results  Component Value Date   CHOL 250 (H) 08/21/2013   HDL 69 08/21/2013   LDLCALC 169 (H) 08/21/2013   TRIG 59 08/21/2013   CHOLHDL 3.6 08/21/2013    Significant Diagnostic Results in last 30 days:  No results found.  Assessment/Plan There are no diagnoses linked to this encounter.   Family/ staff Communication:   Labs/tests ordered:

## 2017-09-26 NOTE — Progress Notes (Signed)
Location:   [facility] Room Number: 128/P Place of Service:  SNF (31) Provider:  Yetta Numbers, MD  Patient Care Team: Jani Gravel, MD as PCP - General (Internal Medicine) Satira Sark, MD as PCP - Cardiology (Cardiology)  Extended Emergency Contact Information Primary Emergency Contact: [place], [place] [address] Johnnette Litter of [place] Phone: [phone] Mobile Phone: [phone] Relation: Daughter Secondary Emergency Contact: Wynn Maudlin Address: [address]           [place], [place] [address] Johnnette Litter of [place] Phone: [phone] Mobile Phone: [phone] Relation: Son  Code Status:  Full Code Goals of care: Advanced Directive information Advanced Directives 09/26/2017  Does Patient Have a Medical Advance Directive? Yes  Type of Advance Directive (No Data)  Does patient want to make changes to medical advance directive? No - Patient declined  Would patient like information on creating a medical advance directive? No - Patient declined  Pre-existing out of facility DNR order (yellow form or pink MOST form) -     Chief Complaint  Patient presents with  . Acute Visit    Renal Insyfficiency and Discharge Discussion    HPI:  Pt is a 84 y.o. male seen today for an acute visit for for Renal Insufficiency. Also to D/W  His discharge.  Patient is a 84 y.o. male seen today for admission to SNF for therapy after staying in the hospital from 04/12-04/17 Patient Has h/o Hypertension, CKD, Diabetes Mellitus And dementia.  Patient unable to give me any history due to his dementia. He was admitted in the hospital with Rhabdomyolysis, Mental Status changes. In the facility he did have Severe Dementia. He was initially non Compliant but did improve and started doing well with therapy.   Past Medical History:  Diagnosis Date  . Dementia   . Diabetes mellitus without complication ([place])   .  Encephalopathy   . Gastritis 2004  . GIB (gastrointestinal bleeding) 2004  . Hypertension   . Poor historian   . Sinus bradycardia seen on cardiac monitor    Past Surgical History:  Procedure Laterality Date  . INGUINAL HERNIA REPAIR  09/2010   Bilateral  . UPPER GASTROINTESTINAL ENDOSCOPY  2004    No Known Allergies  Outpatient Encounter Medications as of 09/26/2017  Medication Sig  . amLODipine (NORVASC) 5 MG tablet Take 5 mg by mouth daily.  Roseanne Kaufman Peru-Castor Oil (VENELEX) OINT Apply liberal amount to coccyx, sacrum and bilateral buttocks q shift and prn after incontinence episodes for skin protection  . pioglitazone (ACTOS) 15 MG tablet Take 1 tablet by mouth daily.  . polyethylene glycol (MIRALAX / GLYCOLAX) packet Take 17 g by mouth daily as needed for mild constipation.  . simvastatin (ZOCOR) 40 MG tablet Take 20 mg by mouth daily.   . [DISCONTINUED] lisinopril (PRINIVIL,ZESTRIL) 20 MG tablet Take 1 tablet (20 mg total) by mouth daily.   No facility-administered encounter medications on file as of 09/26/2017.      Review of Systems  Unable to perform ROS: Dementia     There is no immunization history on file for this patient. Pertinent  Health Maintenance Due  Topic Date Due  . FOOT EXAM  09/30/2017 (Originally 07/01/1937)  . OPHTHALMOLOGY EXAM  09/30/2017 (Originally 07/01/1937)  . PNA vac Low Risk Adult (1 of 2 - PCV13) 09/30/2017 (Originally 06/30/1994)  . URINE MICROALBUMIN  10/11/2017 (Originally 07/01/1937)  . INFLUENZA  VACCINE  12/14/2017  . HEMOGLOBIN A1C  02/24/2018   No flowsheet data found. Functional Status Survey:    Vitals:   09/26/17 1113  BP: (!) 143/49  Pulse: 60  Resp: 20  Temp: 98.3 F (36.8 C)  TempSrc: Oral  SpO2: 97%   There is no height or weight on file to calculate BMI. Physical Exam  Constitutional: He appears well-developed and well-nourished.  HENT:  Head: Normocephalic.  Mouth/Throat: Oropharynx is clear and moist.  Eyes:  Pupils are equal, round, and reactive to light.  Neck: Neck supple.  Cardiovascular: Normal rate and regular rhythm.  No murmur heard. Pulmonary/Chest: Effort normal and breath sounds normal. No stridor. No respiratory distress. He has no wheezes.  Abdominal: Soft. Bowel sounds are normal. He exhibits no distension. There is no tenderness. There is no guarding.  Musculoskeletal:  Moderate Edema bilateral.  Neurological: He is alert.  Skin: Skin is warm and dry.  Psychiatric: He has a normal mood and affect. His behavior is normal. Thought content normal.    Labs reviewed: Recent Labs    12/27/16 1039  09/11/17 0646 09/14/17 0708 09/25/17 0757  NA 136   < > 139 139 140  K 4.9   < > 5.2* 5.0 4.8  CL 107   < > 109 106 108  CO2 23   < > 22 22 25   GLUCOSE 127*   < > 118* 129* 131*  BUN 57*   < > 56* 65* 59*  CREATININE 2.70*   < > 2.55* 2.50* 2.24*  CALCIUM 8.8*   < > 9.0 9.1 9.0  MG 3.4*  --   --   --   --    < > = values in this interval not displayed.   Recent Labs    08/28/17 0634 08/30/17 0524 09/04/17 0529  AST 104* 61* 26  ALT 64* 53 34  ALKPHOS 44 50 54  BILITOT 0.5 1.0 0.7  PROT 5.3* 5.6* 5.8*  ALBUMIN 2.6* 2.7* 3.0*   Recent Labs    12/27/16 1039 08/25/17 1204  08/30/17 0524 09/04/17 0529 09/11/17 0646  WBC 5.7 11.6*   < > 5.8 9.4 6.2  NEUTROABS 4.0 10.2*  --   --  7.8*  --   HGB 9.9* 12.1*   < > 9.3* 8.8* 8.5*  HCT 30.7* 39.4   < > 30.1* 29.2* 27.8*  MCV 84.8 88.3   < > 88.0 88.8 88.8  PLT 147* 216   < > 126* 186 251   < > = values in this interval not displayed.   Lab Results  Component Value Date   TSH 1.690 08/25/2017   Lab Results  Component Value Date   HGBA1C 6.4 (H) 08/25/2017   Lab Results  Component Value Date   CHOL 250 (H) 08/21/2013   HDL 69 08/21/2013   LDLCALC 169 (H) 08/21/2013   TRIG 59 08/21/2013   CHOLHDL 3.6 08/21/2013    Significant Diagnostic Results in last 30 days:  No results found.  Assessment/Plan Renal  Insufficiency Patient's creatinine stays elevated to 2.5 in spite of discontinuing Maxide and lisinopril. Actually he has gained around 5 pounds and has worsening edema in his lower extremities We will start him on Lasix 20 mg Repeat BMP in a week Referred to nephrology  Essential hypertension On amlodipine  Blood pressure stable Starting diuretic will help with BP Diabetes mellitus type 2 Patient on Actos BS mostly less then 150 A1C was 6.4 in [facility]  Metformin discontinued due to CKD Hyperlipidemia, Continue Zocor Elevated troponin Per cardiology it was not related to any acute cardiac event. His 2D echo was normal with a EF of 60% Dementia We will start him on Aricept 5 mg daily Discharge planning Family is planning to take patient home with 24-hour supervision. Patient would need help with both ADLs and IADL  Family/ staff Communication:   Labs/tests ordered:

## 2017-09-26 NOTE — Telephone Encounter (Signed)
Got a letter from [employer] stating that pt cancelled the event monitor.

## 2017-09-27 ENCOUNTER — Encounter (HOSPITAL_COMMUNITY)
Admission: RE | Admit: 2017-09-27 | Discharge: 2017-09-27 | Disposition: A | Discharge status: Home / Self Care | Payer: Medicare Other | Source: Skilled Nursing Facility | Attending: Internal Medicine | Admitting: Internal Medicine

## 2017-09-27 ENCOUNTER — Ambulatory Visit (INDEPENDENT_AMBULATORY_CARE_PROVIDER_SITE_OTHER): Payer: Medicare Other | Admitting: Physician Assistant

## 2017-09-27 ENCOUNTER — Encounter: Payer: Self-pay | Admitting: Physician Assistant

## 2017-09-27 VITALS — BP 150/50 | HR 93 | Ht 66.5 in | Wt 192.0 lb

## 2017-09-27 DIAGNOSIS — N183 Chronic kidney disease, stage 3 (moderate): Secondary | ICD-10-CM | POA: Diagnosis not present

## 2017-09-27 DIAGNOSIS — T796XXD Traumatic ischemia of muscle, subsequent encounter: Secondary | ICD-10-CM | POA: Diagnosis not present

## 2017-09-27 DIAGNOSIS — I10 Essential (primary) hypertension: Secondary | ICD-10-CM

## 2017-09-27 DIAGNOSIS — I5033 Acute on chronic diastolic (congestive) heart failure: Secondary | ICD-10-CM | POA: Diagnosis not present

## 2017-09-27 DIAGNOSIS — N179 Acute kidney failure, unspecified: Secondary | ICD-10-CM

## 2017-09-27 DIAGNOSIS — R279 Unspecified lack of coordination: Secondary | ICD-10-CM | POA: Insufficient documentation

## 2017-09-27 DIAGNOSIS — R001 Bradycardia, unspecified: Secondary | ICD-10-CM

## 2017-09-27 DIAGNOSIS — R195 Other fecal abnormalities: Secondary | ICD-10-CM | POA: Insufficient documentation

## 2017-09-27 DIAGNOSIS — T68XXXD Hypothermia, subsequent encounter: Secondary | ICD-10-CM | POA: Insufficient documentation

## 2017-09-27 DIAGNOSIS — R262 Difficulty in walking, not elsewhere classified: Secondary | ICD-10-CM | POA: Insufficient documentation

## 2017-09-27 DIAGNOSIS — R41841 Cognitive communication deficit: Secondary | ICD-10-CM | POA: Insufficient documentation

## 2017-09-27 LAB — BASIC METABOLIC PANEL
Anion gap: 7 (ref 5–15)
BUN: 59 mg/dL — AB (ref 6–20)
CALCIUM: 8.8 mg/dL — AB (ref 8.9–10.3)
CO2: 23 mmol/L (ref 22–32)
Chloride: 108 mmol/L (ref 101–111)
Creatinine, Ser: 2.06 mg/dL — ABNORMAL HIGH (ref 0.61–1.24)
GFR calc Af Amer: 31 mL/min — ABNORMAL LOW (ref >60)
GFR, EST NON AFRICAN AMERICAN: 27 mL/min — AB (ref >60)
GLUCOSE: 150 mg/dL — AB (ref 65–99)
Potassium: 4.7 mmol/L (ref 3.5–5.1)
Sodium: 138 mmol/L (ref 135–145)

## 2017-09-27 NOTE — Patient Instructions (Signed)
Medication Instructions:  Your physician has recommended you make the following change in your medication:  Increase Lasix to 40 mg for 3 Days then resume 20 mg Daily    Labwork: NONE   Testing/Procedures: NONE   Follow-Up: Your physician recommends that you schedule a follow-up appointment in: 1-2 months with Dr. Harl Bowie.    Any Other Special Instructions Will Be Listed Below (If Applicable). Compression Hose  2 gram sodium Diet     If you need a refill on your cardiac medications before your next appointment, please call your pharmacy.  Thank you for choosing [facility]!

## 2017-09-27 NOTE — Progress Notes (Signed)
Cardiology Office Note    Date:  09/27/2017   ID:  Paul Perez, DOB 06-05-1927, MRN [id number]  PCP:  Jani Gravel, MD  Cardiologist: Rozann Lesches, MD  No chief complaint on file.   History of Present Illness:  Paul Perez is a 84 y.o. male with history of bradycardia in the setting of rhabdomyolysis in 2015, dementia, hypertension, history of upper GI bleed.  Patient was found down in the yard by his brother 08/25/2017 where he had laid all night long.  He had been mowing with a riding mower and the mower was found in the woods.  CKs were 22,000 and troponins 9.3.  Core body temperature was 94.6. It was not clear if he had syncope.  In the hospital patient had bradycardia down to the high 30s at times while resting not on any rate lowering treatment.  Most of bradycardia was in the a.m. hours.  Afternoon and evening rates were normal.  No further cardiac work-up in the hospital was recommended but he was to have a 14-day event monitor as an outpatient but he canceled this.  2D echo 08/26/2017 normal LVEF 60 to 65% with no wall motion abnormality, grade 1 DD.  Patient comes in today with an Environmental consultant from [facility].  He has dementia and she says they have trouble keeping his clothes on and there is no way he would be able to wear a heart monitor.  He has had some swelling in his legs recently.  Blood pressure is up.  He was sent home on lisinopril and Maxide but these were stopped on the 29th.  Weights have fluctuated at the [facility] by 10 pounds.  Only one blood pressure reading from the [facility] was brought.  Patient is pleasantly confused but cannot answer questions.  Assistant said he can walk only with assistance or he will fall.  He is in the wheelchair most of the day.   Past Medical History:  Diagnosis Date  . Dementia   . Diabetes mellitus without complication ([place])   . Encephalopathy   . Gastritis 2004  . GIB (gastrointestinal bleeding) 2004  . Hypertension     . Poor historian   . Sinus bradycardia seen on cardiac monitor     Past Surgical History:  Procedure Laterality Date  . INGUINAL HERNIA REPAIR  09/2010   Bilateral  . UPPER GASTROINTESTINAL ENDOSCOPY  2004    Current Medications: Current Meds  Medication Sig  . amLODipine (NORVASC) 5 MG tablet Take 5 mg by mouth daily.  Roseanne Kaufman Peru-Castor Oil (VENELEX) OINT Apply liberal amount to coccyx, sacrum and bilateral buttocks q shift and prn after incontinence episodes for skin protection  . donepezil (ARICEPT) 5 MG tablet Take 5 mg by mouth at bedtime.  . furosemide (LASIX) 20 MG tablet Take 20 mg by mouth daily.  . pioglitazone (ACTOS) 15 MG tablet Take 1 tablet by mouth daily.  . polyethylene glycol (MIRALAX / GLYCOLAX) packet Take 17 g by mouth daily as needed for mild constipation.  . simvastatin (ZOCOR) 40 MG tablet Take 20 mg by mouth daily.      Allergies:   Patient has no known allergies.   Social History   Socioeconomic History  . Marital status: Widowed    Spouse name: Not on file  . Number of children: Not on file  . Years of education: Not on file  . Highest education level: Not on file  Occupational History  . Not  on file  Social Needs  . Financial resource strain: Not on file  . Food insecurity:    Worry: Not on file    Inability: Not on file  . Transportation needs:    Medical: Not on file    Non-medical: Not on file  Tobacco Use  . Smoking status: Never Smoker  . Smokeless tobacco: Never Used  Substance and Sexual Activity  . Alcohol use: No  . Drug use: No  . Sexual activity: Not on file  Lifestyle  . Physical activity:    Days per week: Not on file    Minutes per session: Not on file  . Stress: Not on file  Relationships  . Social connections:    Talks on phone: Not on file    Gets together: Not on file    Attends religious service: Not on file    Active member of club or organization: Not on file    Attends meetings of clubs or  organizations: Not on file    Relationship status: Not on file  Other Topics Concern  . Not on file  Social History Narrative  . Not on file     Family History:  The patient's family history includes Diabetes in his mother and sister.   ROS:   Please see the history of present illness.    Review of Systems  Reason unable to perform ROS: Dementia all the history is taken from the aide from [facility].   All other systems reviewed and are negative.   PHYSICAL EXAM:   VS:  BP (!) 150/50   Pulse 93   Ht 5' 6.5" (1.689 m)   Wt 192 lb (87.1 kg)   SpO2 (!) 52%   BMI 30.53 kg/m   Physical Exam  GEN: Elderly, in no acute distress  Neck: no JVD, carotid bruits, or masses Cardiac:RRR; with skipping no murmurs, rubs, or gallops  Respiratory:  clear to auscultation bilaterally, normal work of breathing GI: soft, nontender, nondistended, + BS Ext: +2 edema bilaterally without cyanosis, clubbing,  Good distal pulses bilaterally Neuro:  Alert and Oriented x 3, Strength and sensation are intact Psych: euthymic mood, full affect  Wt Readings from Last 3 Encounters:  09/27/17 192 lb (87.1 kg)  08/26/17 186 lb 11.7 oz (84.7 kg)  12/27/16 200 lb (90.7 kg)      Studies/Labs Reviewed:   EKG:  EKG is  ordered today.  The ekg ordered today demonstrates normal sinus rhythm with PACs T wave inversion inferior lateral, no acute change  Recent Labs: 12/27/2016: Magnesium 3.4 08/25/2017: TSH 1.690 09/04/2017: ALT 34 09/11/2017: Hemoglobin 8.5; Platelets 251 09/27/2017: BUN 59; Creatinine, Ser 2.06; Potassium 4.7; Sodium 138   Lipid Panel    Component Value Date/Time   CHOL 250 (H) 08/21/2013 0555   TRIG 59 08/21/2013 0555   HDL 69 08/21/2013 0555   CHOLHDL 3.6 08/21/2013 0555   VLDL 12 08/21/2013 0555   LDLCALC 169 (H) 08/21/2013 0555    Additional studies/ records that were reviewed today include:  2D echo 08/26/2017 Study Conclusions   - Left ventricle: The cavity size was normal.  There was mild   concentric hypertrophy with moderate hypertrophy of the basal   septum. Systolic function was normal. The estimated ejection   fraction was in the range of 60% to 65%. Wall motion was normal;   there were no regional wall motion abnormalities. Doppler   parameters are consistent with abnormal left ventricular   relaxation (grade  1 diastolic dysfunction). Doppler parameters   are consistent with indeterminate ventricular filling pressure. - Aortic valve: Transvalvular velocity was within the normal range.   There was no stenosis. There was no regurgitation. - Mitral valve: Transvalvular velocity was within the normal range.   There was no evidence for stenosis. There was mild regurgitation. - Right ventricle: The cavity size was normal. Wall thickness was   normal. Systolic function was normal. - Atrial septum: No defect or patent foramen ovale was identified   by color flow Doppler. - Tricuspid valve: There was trivial regurgitation. - Pulmonary arteries: Systolic pressure was within the normal   range.      ASSESSMENT:    1. Bradycardia   2. Acute on chronic diastolic CHF (congestive heart failure) ([place])   3. Traumatic rhabdomyolysis, subsequent encounter   4. Essential hypertension   5. Acute renal failure superimposed on stage 3 chronic kidney disease, unspecified acute renal failure type (H[facility])      PLAN:  In order of problems listed above:  Bradycardia in the setting of rhabdomyolysis and hypothermia, dehydration while in the hospital.  Patient was supposed to wear 14-day monitor but canceled secondary to dementia.  2D echo with normal LVEF and grade 1 DD.  We will hold off on placing monitor because he most likely will not be put on.  Patient's heart rate is in the 70s today.  No dizziness and has not passed out in the [facility].  Follow-up with Dr. Harl Bowie in 1 to 2 months.  Acute on chronic diastolic CHF with increased edema.  Will increase Lasix to 40  mg daily for 3 days then back down to 20 mg daily, compression stockings, keep legs elevated, 2 g sodium diet at the hospital.  He may need another blood pressure agent as his blood pressure is up a little today.  His lisinopril and Maxide were stopped at the [facility] at the end of April.  We will allow Dr. Lyndel Safe to evaluate as we do not have readings of his blood pressures from the [facility].  Rhabdomyolysis secondary to falling off his tractor and laying in the yard for over 24 hours.  Essential hypertension blood pressure up a little today.  Do not have readings from the [facility].  Dr. Lyndel Safe to check and treat at the [facility].  Acute renal failure in the setting of rhabdomyolysis.  Creatinine today was 2.04 which is improving    Medication Adjustments/Labs and Tests Ordered: Current medicines are reviewed at length with the patient today.  Concerns regarding medicines are outlined above.  Medication changes, Labs and Tests ordered today are listed in the Patient Instructions below. Patient Instructions  Medication Instructions:  Your physician has recommended you make the following change in your medication:  Increase Lasix to 40 mg for 3 Days then resume 20 mg Daily    Labwork: NONE   Testing/Procedures: NONE   Follow-Up: Your physician recommends that you schedule a follow-up appointment in: 1-2 months with Dr. Harl Bowie.    Any Other Special Instructions Will Be Listed Below (If Applicable). Compression Hose  2 gram sodium Diet     If you need a refill on your cardiac medications before your next appointment, please call your pharmacy.  Thank you for choosing [facility]!      Sumner Boast, PA-C  09/27/2017 2:29 PM    [facility] [place], [place], [place]  [address] Phone: [phone];  Fax: [phone]

## 2017-09-28 ENCOUNTER — Encounter: Payer: Self-pay | Admitting: Internal Medicine

## 2017-09-28 ENCOUNTER — Non-Acute Institutional Stay (SKILLED_NURSING_FACILITY): Payer: Medicare Other | Admitting: Internal Medicine

## 2017-09-28 DIAGNOSIS — F0390 Unspecified dementia without behavioral disturbance: Secondary | ICD-10-CM | POA: Diagnosis not present

## 2017-09-28 DIAGNOSIS — T796XXS Traumatic ischemia of muscle, sequela: Secondary | ICD-10-CM | POA: Diagnosis not present

## 2017-09-28 DIAGNOSIS — R600 Localized edema: Secondary | ICD-10-CM

## 2017-09-28 DIAGNOSIS — G9340 Encephalopathy, unspecified: Secondary | ICD-10-CM | POA: Diagnosis not present

## 2017-09-28 DIAGNOSIS — E118 Type 2 diabetes mellitus with unspecified complications: Secondary | ICD-10-CM | POA: Diagnosis not present

## 2017-09-28 DIAGNOSIS — I10 Essential (primary) hypertension: Secondary | ICD-10-CM

## 2017-09-28 DIAGNOSIS — IMO0002 Reserved for concepts with insufficient information to code with codable children: Secondary | ICD-10-CM

## 2017-09-28 DIAGNOSIS — E1165 Type 2 diabetes mellitus with hyperglycemia: Secondary | ICD-10-CM

## 2017-09-28 NOTE — Progress Notes (Addendum)
Location:   [facility] Room Number: 128/P Place of Service:  SNF [phone])  Provider: Veleta Miners  PCP: Jani Gravel, MD Patient Care Team: Jani Gravel, MD as PCP - General (Internal Medicine) Satira Sark, MD as PCP - Cardiology (Cardiology)  Extended Emergency Contact Information Primary Emergency Contact: [place], [place] [address] Johnnette Litter of [place] Phone: [phone] Mobile Phone: [phone] Relation: Daughter Secondary Emergency Contact: Wynn Maudlin Address: [address]           [place], [place] [address] Johnnette Litter of [place] Phone: [phone] Mobile Phone: [phone] Relation: Son  Code Status: Full Code Goals of care:  Advanced Directive information Advanced Directives 10/05/2017  Does Patient Have a Medical Advance Directive? Yes  Type of Advance Directive (No Data)  Does patient want to make changes to medical advance directive? No - Patient declined  Would patient like information on creating a medical advance directive? No - Patient declined  Pre-existing out of facility DNR order (yellow form or pink MOST form) -     No Known Allergies  Chief Complaint  Patient presents with  . Discharge Note    Patient is being seen for Acute visit     HPI:  84 y.o. male  Seen Today for acute visit  Patient is a 84 y.o.malewas admitted to SNF for therapy after staying in the hospital from 04/12-04/17 Patient Has h/o Hypertension, CKD, Diabetes Mellitus And dementia.  Patient was taken to the hospital after he was  found by his brother on the ground with a riding mower in the woods.  It seems like he has been outside all night.  Patient does not remember anything.  In the hospital his CT scan of the head which did not show any acute changes just atrophy .  He did have moderate to severe spinal stenosis on his cervical spine CT.  He also was hypernatremic with sodium of 146.  Creatinine was 2.7 and had  elevated troponin of 9.31 with CK of 22,000. He received IV fluids which improved his rhabdomyolysis and creatinine and Sodium came down to his baseline.  Cardiology Saw him and don't think he had Acute cardiac Event.They did recommend an event monitor due to his  Intermittent bradycardia He stayed stable in the facility. His Ambien was discontinued. He was also started on Aricept for Dementia Per his family they did not realize that he has Severe dementia and was unsafe for him to stay by himself.He did improve some with therapy but is not cognitively safe to walk with the walker and now is on wheelchair. His other problem was CKD. His Creat stayed above 2.0 in spite of stopping Lisinopril and Maxzide. He was started back on Lasix for LE edema. And weight gain. He also never had Event monitor placed due to him not keeping it on because of his dementia. He did not have any episodes of syncope while in Facility.   Past Medical History:  Diagnosis Date  . Dementia   . Diabetes mellitus without complication ([facility])   . Encephalopathy   . Gastritis 2004  . GIB (gastrointestinal bleeding) 2004  . Hypertension   . Poor historian   . Sinus bradycardia seen on cardiac monitor     Past Surgical History:  Procedure Laterality Date  . INGUINAL HERNIA REPAIR  09/2010   Bilateral  . UPPER GASTROINTESTINAL ENDOSCOPY  2004      reports that  he has never smoked. He has never used smokeless tobacco. He reports that he does not drink alcohol or use drugs. Social History   Socioeconomic History  . Marital status: Widowed    Spouse name: Not on file  . Number of children: Not on file  . Years of education: Not on file  . Highest education level: Not on file  Occupational History  . Not on file  Social Needs  . Financial resource strain: Not on file  . Food insecurity:    Worry: Not on file    Inability: Not on file  . Transportation needs:    Medical: Not on file    Non-medical: Not on file    Tobacco Use  . Smoking status: Never Smoker  . Smokeless tobacco: Never Used  Substance and Sexual Activity  . Alcohol use: No  . Drug use: No  . Sexual activity: Not on file  Lifestyle  . Physical activity:    Days per week: Not on file    Minutes per session: Not on file  . Stress: Not on file  Relationships  . Social connections:    Talks on phone: Not on file    Gets together: Not on file    Attends religious service: Not on file    Active member of club or organization: Not on file    Attends meetings of clubs or organizations: Not on file    Relationship status: Not on file  . Intimate partner violence:    Fear of current or ex partner: Not on file    Emotionally abused: Not on file    Physically abused: Not on file    Forced sexual activity: Not on file  Other Topics Concern  . Not on file  Social History Narrative  . Not on file   Functional Status Survey:    No Known Allergies  Pertinent  Health Maintenance Due  Topic Date Due  . URINE MICROALBUMIN  10/11/2017 (Originally 07/01/1937)  . OPHTHALMOLOGY EXAM  11/05/2017 (Originally 07/01/1937)  . PNA vac Low Risk Adult (1 of 2 - PCV13) 11/05/2017 (Originally 06/30/1994)  . INFLUENZA VACCINE  12/14/2017  . HEMOGLOBIN A1C  02/24/2018  . FOOT EXAM  09/08/2018    Medications: Outpatient Encounter Medications as of 09/28/2017  Medication Sig  . amLODipine (NORVASC) 5 MG tablet Take 5 mg by mouth daily.  Roseanne Kaufman Peru-Castor Oil (VENELEX) OINT Apply liberal amount to coccyx, sacrum and bilateral buttocks q shift and prn after incontinence episodes for skin protection  . donepezil (ARICEPT) 5 MG tablet Take 5 mg by mouth at bedtime.  . furosemide (LASIX) 20 MG tablet Take 20 mg by mouth daily. Starting 10/01/2017  . pioglitazone (ACTOS) 15 MG tablet Take 1 tablet by mouth daily.  . polyethylene glycol (MIRALAX / GLYCOLAX) packet Take 17 g by mouth daily as needed for mild constipation.  . simvastatin (ZOCOR) 40 MG  tablet Take 20 mg by mouth daily.   . [DISCONTINUED] furosemide (LASIX) 40 MG tablet Take 40 mg by mouth daily. Starting 09/28/2017-09/30/2017   No facility-administered encounter medications on file as of 09/28/2017.      Review of Systems  Unable to perform ROS: Dementia    Vitals:   09/28/17 1555  BP: (!) 150/90  Pulse: (!) 50  Resp: 20  Temp: 98 F (36.7 C)   There is no height or weight on file to calculate BMI. Physical Exam  Constitutional: He appears well-developed and well-nourished.  HENT:  Head: Normocephalic.  Mouth/Throat: Oropharynx is clear and moist.  Eyes: Pupils are equal, round, and reactive to light.  Neck: Neck supple.  Cardiovascular: Normal rate and regular rhythm.  Pulmonary/Chest: Effort normal and breath sounds normal.  Abdominal: Soft. Bowel sounds are normal.  Musculoskeletal: He exhibits edema.  Neurological: He is alert.  Skin: Skin is warm and dry.  Psychiatric: He has a normal mood and affect. His behavior is normal.    Labs reviewed: Basic Metabolic Panel: Recent Labs    12/27/16 1039  10/04/17 0751 10/07/17 0815 10/08/17 0742  NA 136   < > 140 137 138  K 4.9   < > 5.1 5.1 5.1  CL 107   < > 107 107 108  CO2 23   < > 25 24 23   GLUCOSE 127*   < > 156* 134* 109*  BUN 57*   < > 63* 59* 63*  CREATININE 2.70*   < > 2.29* 2.36* 2.48*  CALCIUM 8.8*   < > 9.1 9.2 8.9  MG 3.4*  --   --   --   --    < > = values in this interval not displayed.   Liver Function Tests: Recent Labs    08/28/17 0634 08/30/17 0524 09/04/17 0529  AST 104* 61* 26  ALT 64* 53 34  ALKPHOS 44 50 54  BILITOT 0.5 1.0 0.7  PROT 5.3* 5.6* 5.8*  ALBUMIN 2.6* 2.7* 3.0*   No results for input(s): LIPASE, AMYLASE in the last 8760 hours. No results for input(s): AMMONIA in the last 8760 hours. CBC: Recent Labs    09/04/17 0529 09/11/17 0646 10/07/17 0815 10/08/17 0742  WBC 9.4 6.2 5.9 6.0  NEUTROABS 7.8*  --  3.6 3.2  HGB 8.8* 8.5* 9.0* 8.5*  HCT 29.2*  27.8* 28.9* 28.0*  MCV 88.8 88.8 88.7 88.3  PLT 186 251 197 185   Cardiac Enzymes: Recent Labs    08/25/17 1444 08/25/17 2207 08/26/17 0539  08/30/17 0524 09/04/17 0529 09/14/17 0708  CKTOTAL  --   --  13,395*   < > 3,057* 840* 157  TROPONINI 8.03* 6.94* 4.45*  --   --   --   --    < > = values in this interval not displayed.   BNP: Invalid input(s): POCBNP CBG: Recent Labs    08/30/17 0433 08/30/17 0826 08/30/17 1125  GLUCAP 111* 106* 181*    Procedures and Imaging Studies During Stay: No results found.  Assessment/Plan:    LE edema AS per Cardiology will start him on higher dose of lasix for few days. Follow up with BMP and BP Essential hypertension Patient is on  amlodipine,  Lisinopril and Maxzide was discontinued due to his Renal Insufficieny On Lasix now.  His blood pressure was mildly elevated today Follow up in facility   Acute encephalopathy with Dementia Patient does have baseline dementia with worsening with this hospitalization. He did have similar episode 2 years ago from which he recovered.  Family aware that he would need more supervision on Discharge. They are planning to have 24 hour care for him at home. [place] level [place] He is at high risk for Falls. He was also started on Aricept.  Diabetes mellitus type 2,  Patient is on Actos  BS mostly below 200 A1C Was 6.4 in hospital. Metformin discontinued due to CKD  Non-traumatic rhabdomyolysis CK peaked at 22000 Came down with hydration  Chronic Renal  Disease with worsening recently Will Need follow uP renal function  Will also need Nephrology Consult   Hyperlipidemia, Continue Zocor  Elevated troponin Per cardiology it was not related to any acute cardiac event. His 2D echo was normal with a EF of 60%  Bradycardia He does have intermittent bradycardia.  Though he stayed asymptomatic in the hospital and Facility He never had Cardiac  monitor placed due to his dementia He has appointment to follow with them in 2 months Acute hypernatremia Resolved with hydration  Discharge Planning Patient was suppose to go home today but his Family is getting extension from the Insurance for his stay in facility.

## 2017-10-02 ENCOUNTER — Encounter (HOSPITAL_COMMUNITY)
Admission: RE | Admit: 2017-10-02 | Discharge: 2017-10-02 | Disposition: A | Discharge status: Home / Self Care | Payer: Medicare Other | Source: Skilled Nursing Facility | Attending: Internal Medicine | Admitting: Internal Medicine

## 2017-10-02 DIAGNOSIS — R7989 Other specified abnormal findings of blood chemistry: Secondary | ICD-10-CM | POA: Insufficient documentation

## 2017-10-02 LAB — BASIC METABOLIC PANEL
ANION GAP: 9 (ref 5–15)
BUN: 61 mg/dL — AB (ref 6–20)
CALCIUM: 9 mg/dL (ref 8.9–10.3)
CO2: 25 mmol/L (ref 22–32)
Chloride: 106 mmol/L (ref 101–111)
Creatinine, Ser: 2.14 mg/dL — ABNORMAL HIGH (ref 0.61–1.24)
GFR calc Af Amer: 30 mL/min — ABNORMAL LOW (ref >60)
GFR, EST NON AFRICAN AMERICAN: 26 mL/min — AB (ref >60)
Glucose, Bld: 134 mg/dL — ABNORMAL HIGH (ref 65–99)
POTASSIUM: 4.7 mmol/L (ref 3.5–5.1)
Sodium: 140 mmol/L (ref 135–145)

## 2017-10-04 ENCOUNTER — Encounter (HOSPITAL_COMMUNITY)
Admission: RE | Admit: 2017-10-04 | Discharge: 2017-10-04 | Disposition: A | Discharge status: Home / Self Care | Payer: Medicare Other | Source: Skilled Nursing Facility | Attending: *Deleted | Admitting: *Deleted

## 2017-10-04 LAB — BASIC METABOLIC PANEL
Anion gap: 8 (ref 5–15)
BUN: 63 mg/dL — ABNORMAL HIGH (ref 6–20)
CHLORIDE: 107 mmol/L (ref 101–111)
CO2: 25 mmol/L (ref 22–32)
Calcium: 9.1 mg/dL (ref 8.9–10.3)
Creatinine, Ser: 2.29 mg/dL — ABNORMAL HIGH (ref 0.61–1.24)
GFR, EST AFRICAN AMERICAN: 28 mL/min — AB (ref >60)
GFR, EST NON AFRICAN AMERICAN: 24 mL/min — AB (ref >60)
Glucose, Bld: 156 mg/dL — ABNORMAL HIGH (ref 65–99)
Potassium: 5.1 mmol/L (ref 3.5–5.1)
Sodium: 140 mmol/L (ref 135–145)

## 2017-10-05 ENCOUNTER — Non-Acute Institutional Stay (SKILLED_NURSING_FACILITY): Payer: Medicare Other | Admitting: Internal Medicine

## 2017-10-05 ENCOUNTER — Encounter: Payer: Self-pay | Admitting: Internal Medicine

## 2017-10-05 DIAGNOSIS — E1165 Type 2 diabetes mellitus with hyperglycemia: Secondary | ICD-10-CM | POA: Diagnosis not present

## 2017-10-05 DIAGNOSIS — I10 Essential (primary) hypertension: Secondary | ICD-10-CM

## 2017-10-05 DIAGNOSIS — N183 Chronic kidney disease, stage 3 (moderate): Secondary | ICD-10-CM | POA: Diagnosis not present

## 2017-10-05 DIAGNOSIS — IMO0002 Reserved for concepts with insufficient information to code with codable children: Secondary | ICD-10-CM

## 2017-10-05 DIAGNOSIS — N179 Acute kidney failure, unspecified: Secondary | ICD-10-CM | POA: Diagnosis not present

## 2017-10-05 DIAGNOSIS — E118 Type 2 diabetes mellitus with unspecified complications: Secondary | ICD-10-CM | POA: Diagnosis not present

## 2017-10-05 NOTE — Progress Notes (Signed)
Location:   [facility] Room Number: 128/P Place of Service:  SNF [phone]) Provider:  Lucretia Field, MD  Patient Care Team: Jani Gravel, MD as PCP - General (Internal Medicine) Satira Sark, MD as PCP - Cardiology (Cardiology)  Extended Emergency Contact Information Primary Emergency Contact: [place], [place] [address] Johnnette Litter of [place] Phone: [phone] Mobile Phone: [phone] Relation: Daughter Secondary Emergency Contact: Wynn Maudlin Address: [address]           [place], [place] [address] Johnnette Litter of [place] Phone: [phone] Mobile Phone: [phone] Relation: Son  Code Status:  Full Code Goals of care: Advanced Directive information Advanced Directives 10/05/2017  Does Patient Have a Medical Advance Directive? Yes  Type of Advance Directive (No Data)  Does patient want to make changes to medical advance directive? No - Patient declined  Would patient like information on creating a medical advance directive? No - Patient declined  Pre-existing out of facility DNR order (yellow form or pink MOST form) -     Chief Complaint  Patient presents with  . Acute Visit    Patient is being seen for Worsing Renal Insuffciency    HPI:  Pt is a 84 y.o. male seen today for an acute visit for  A rising creatinine with a history of chronic renal insufficiency  He has a history of hypertension chronic kidney disease diabetes and dementia-he was here for therapy and actually is to be discharged later this week.   He was initially hospitalized after he was found at home by family on the ground-apparently had been outside all night.  CT scan of the head did not Show any acute changes  He had an elevated sodium of 146 creatinine was 2.7 as well as an elevated troponin with a CK elevated at 22,000  He received IV fluids that improved his rhabdomylolysis-creatinine andsodium also came down  His  baseline.  He was seen by cardiology They did not think he had an acute cardiac event--0they recommended an event monitor because of his intermittent bradycardia  He has since seen cardiology and apparently thought to be stable.  He was started on Aricept for dementia-and thought to be unsafe to stay by himself-he has improved with therapy and apparently is now using a walker.  In regards to chronic kidney disease--His creatinine has stayed above 2-despite the stoppage of his lisinopril and Maxzide--he was started backn Lasix for lower extremity edema and weight gain.  Updated labs done this week showed a mild elevation is his creatinine up to 2.29 with BUN of 63previous creatinines were 2.14-2.06-at one point was 2.26  At one point his Lasix was increased for short-term herof increased edema-but now he is back on 20 mg a day-his weight appears relatively stabilized at 193 pounds there have fluctuations    He did not have an event monitor placed because he didn't keep it on because  Of his dementia-he has not had any syncopal episodes.  Currently he is not complaining of any shortness of breath or chest pain or increased weaknesshe is ambulating some with walker but will need 24-hour supervision with his dementia        Past Medical History:  Diagnosis Date  . Dementia   . Diabetes mellitus without complication ([facility])   . Encephalopathy   . Gastritis 2004  . GIB (gastrointestinal bleeding) 2004  . Hypertension   . Poor historian   .  Sinus bradycardia seen on cardiac monitor    Past Surgical History:  Procedure Laterality Date  . INGUINAL HERNIA REPAIR  09/2010   Bilateral  . UPPER GASTROINTESTINAL ENDOSCOPY  2004    No Known Allergies  Outpatient Encounter Medications as of 10/05/2017  Medication Sig  . amLODipine (NORVASC) 5 MG tablet Take 5 mg by mouth daily.  Roseanne Kaufman Peru-Castor Oil (VENELEX) OINT Apply liberal amount to coccyx, sacrum and bilateral buttocks q shift  and prn after incontinence episodes for skin protection  . donepezil (ARICEPT) 5 MG tablet Take 5 mg by mouth at bedtime.  . furosemide (LASIX) 20 MG tablet Take 20 mg by mouth daily. Starting 10/01/2017  . pioglitazone (ACTOS) 15 MG tablet Take 1 tablet by mouth daily.  . polyethylene glycol (MIRALAX / GLYCOLAX) packet Take 17 g by mouth daily as needed for mild constipation.  . simvastatin (ZOCOR) 40 MG tablet Take 20 mg by mouth daily.   . [DISCONTINUED] furosemide (LASIX) 40 MG tablet Take 40 mg by mouth daily. Starting 09/28/2017-09/30/2017   No facility-administered encounter medications on file as of 10/05/2017.     Review of Systems   This is somewhat limited secondary to dementia provided by nursing as wel.  In general is not complaining of  Of any fever or chills.  Skin does not complain of rashes or itching.  Head ears eyes nose mouth and throat is not complaining of sore throat or visual changes.  Respiratory denies shortness of breath or cough.  Cardiac--Does not complain of chest pain has baseline lower extremity edema  GI is not complaining of abdominal discomfort nausea vomiting diarrhea or constipation.  Musculoskeletal has some weakness but has gained strength-does not complain of joint pain.  Neurologic is not complaining of dizziness headache syncope numbness.  In psych again he does have a history of dementia appears to be doing fairly well as long as he has supportive care--he does not complain of being anxious or depressed   There is no immunization history on file for this patient. Pertinent  Health Maintenance Due  Topic Date Due  . URINE MICROALBUMIN  10/11/2017 (Originally 07/01/1937)  . OPHTHALMOLOGY EXAM  11/05/2017 (Originally 07/01/1937)  . PNA vac Low Risk Adult (1 of 2 - PCV13) 11/05/2017 (Originally 06/30/1994)  . INFLUENZA VACCINE  12/14/2017  . HEMOGLOBIN A1C  02/24/2018  . FOOT EXAM  09/08/2018   No flowsheet data found. Functional Status  Survey:    Vitals:   10/05/17 1556  BP: (!) 154/72  Pulse: 64  Resp: 18  Temp: (!) 97 F (36.1 C)  TempSrc: Oral  SpO2: 96%   -- Physical Exam  In general this is a pleasant elderly male in no distress.  His skin is warm and dry  Eyes visual acuity appears grossly intact sclera and conjunctiva are clear.  Oropharynx is clear mucous membranes moist.  Chest is clear to auscultation there is no labored breathing.  Heart is regular rate and rhythm he does havemoderate lower extremity edema bilaterally this appears relatively baseline.  Abdomen is soft nontender with positive bowel sounds.  Musculoskeletal is able to move all extremities 4 is able to stand and ambulate but is quite weak especially without a walker.  Neurologic is grossly intact speech is clear no lateralizing findings.  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   Recent Labs    08/28/17 0634 08/30/17 0524 09/04/17 0529  AST 104* 61* 26  ALT 64* 53 34  ALKPHOS 44 50 54  BILITOT 0.5 1.0 0.7  PROT 5.3* 5.6* 5.8*  ALBUMIN 2.6* 2.7* 3.0*   Recent Labs    12/27/16 1039 08/25/17 1204  08/30/17 0524 09/04/17 0529 09/11/17 0646  WBC 5.7 11.6*   < > 5.8 9.4 6.2  NEUTROABS 4.0 10.2*  --   --  7.8*  --   HGB 9.9* 12.1*   < > 9.3* 8.8* 8.5*  HCT 30.7* 39.4   < > 30.1* 29.2* 27.8*  MCV 84.8 88.3   < > 88.0 88.8 88.8  PLT 147* 216   < > 126* 186 251   < > = values in this interval not displayed.   Lab Results  Component Value  Date   TSH 1.690 08/25/2017   Lab Results  Component Value Date   HGBA1C 6.4 (H) 08/25/2017   Lab Results  Component Value Date   CHOL 250 (H) 08/21/2013   HDL 69 08/21/2013   LDLCALC 169 (H) 08/21/2013   TRIG 59 08/21/2013   CHOLHDL 3.6 08/21/2013    Significant Diagnostic Results in last 30 days:  No results found.  Assessment/Plan  #1 history of chronic renal insufficiency-creatinine appears to be rising some-he will need follow-up by nephrology-we will update this before discharge on Saturday-clinically he appears to be stable-this was discussed with Dr. Leighton Parody this point would be hesitant to hold Lasix secondary to persistent edema   #2 hypertension-again his lisinoprilwas discontinued in hospital because of renal issues-he is on Norvasc 5 mg a day-this was discussed with Dr. Lyndel Safe is welland will start him on hydralazine   25 mg twice a day--Continue to monitor with follow-up on Saturday.  #3-type 2 diabetes this appears well controlled with fasting blood sugars largely in the lower 100s-he is onPioglitazone 15 mg in the morning.--hemoglobin A1c was 6.4 in the hospital  #4 dementia-he has been started on Aricept he appears to be doing well with supportive care he will need continued care at home  [phone]

## 2017-10-06 ENCOUNTER — Encounter: Payer: Self-pay | Admitting: Internal Medicine

## 2017-10-07 ENCOUNTER — Encounter (HOSPITAL_COMMUNITY)
Admission: RE | Admit: 2017-10-07 | Discharge: 2017-10-07 | Disposition: A | Discharge status: Home / Self Care | Payer: Medicare Other | Source: Skilled Nursing Facility | Attending: *Deleted | Admitting: *Deleted

## 2017-10-07 ENCOUNTER — Non-Acute Institutional Stay (SKILLED_NURSING_FACILITY): Payer: Medicare Other | Admitting: Internal Medicine

## 2017-10-07 DIAGNOSIS — I10 Essential (primary) hypertension: Secondary | ICD-10-CM

## 2017-10-07 DIAGNOSIS — F0390 Unspecified dementia without behavioral disturbance: Secondary | ICD-10-CM | POA: Diagnosis not present

## 2017-10-07 DIAGNOSIS — N179 Acute kidney failure, unspecified: Secondary | ICD-10-CM

## 2017-10-07 DIAGNOSIS — N183 Chronic kidney disease, stage 3 (moderate): Secondary | ICD-10-CM | POA: Diagnosis not present

## 2017-10-07 DIAGNOSIS — IMO0002 Reserved for concepts with insufficient information to code with codable children: Secondary | ICD-10-CM

## 2017-10-07 DIAGNOSIS — I5033 Acute on chronic diastolic (congestive) heart failure: Secondary | ICD-10-CM

## 2017-10-07 DIAGNOSIS — E1165 Type 2 diabetes mellitus with hyperglycemia: Secondary | ICD-10-CM | POA: Diagnosis not present

## 2017-10-07 DIAGNOSIS — E118 Type 2 diabetes mellitus with unspecified complications: Secondary | ICD-10-CM | POA: Diagnosis not present

## 2017-10-07 LAB — CBC WITH DIFFERENTIAL/PLATELET
Basophils Absolute: 0 10*3/uL (ref 0.0–0.1)
Basophils Relative: 0 %
EOS PCT: 4 %
Eosinophils Absolute: 0.2 10*3/uL (ref 0.0–0.7)
HCT: 28.9 % — ABNORMAL LOW (ref 39.0–52.0)
HEMOGLOBIN: 9 g/dL — AB (ref 13.0–17.0)
LYMPHS ABS: 1.5 10*3/uL (ref 0.7–4.0)
Lymphocytes Relative: 26 %
MCH: 27.6 pg (ref 26.0–34.0)
MCHC: 31.1 g/dL (ref 30.0–36.0)
MCV: 88.7 fL (ref 78.0–100.0)
MONOS PCT: 10 %
Monocytes Absolute: 0.6 10*3/uL (ref 0.1–1.0)
NEUTROS PCT: 60 %
Neutro Abs: 3.6 10*3/uL (ref 1.7–7.7)
Platelets: 197 10*3/uL (ref 150–400)
RBC: 3.26 MIL/uL — ABNORMAL LOW (ref 4.22–5.81)
RDW: 14.8 % (ref 11.5–15.5)
WBC: 5.9 10*3/uL (ref 4.0–10.5)

## 2017-10-07 LAB — BASIC METABOLIC PANEL
Anion gap: 6 (ref 5–15)
BUN: 59 mg/dL — AB (ref 6–20)
CHLORIDE: 107 mmol/L (ref 101–111)
CO2: 24 mmol/L (ref 22–32)
Calcium: 9.2 mg/dL (ref 8.9–10.3)
Creatinine, Ser: 2.36 mg/dL — ABNORMAL HIGH (ref 0.61–1.24)
GFR calc Af Amer: 27 mL/min — ABNORMAL LOW (ref >60)
GFR calc non Af Amer: 23 mL/min — ABNORMAL LOW (ref >60)
Glucose, Bld: 134 mg/dL — ABNORMAL HIGH (ref 65–99)
Potassium: 5.1 mmol/L (ref 3.5–5.1)
Sodium: 137 mmol/L (ref 135–145)

## 2017-10-07 NOTE — Progress Notes (Signed)
This is an acute visit.  Level of care is skilled.  Facility is Therapist, music complaint---acute visit secondary to follow up renal insuff and HTN  History of present illness  Patient is a pleasant55 year old male-seen today for follow-up of chronic renal insufficiency as well as hypertension  He has a history of hypertension as well as chronic kidney diype 2 diabetes and dementia-he is slated for discharge from facility later today-  He was initially hospitalized after he was found at home by family on the ground-apparently had been outside all night.  CT scan of the head did not Show any acute changes  He had an elevated sodium of 146 creatinine was 2.7 as well as an elevated troponin with a CK elevated at 22,000  He received IV fluids that improved his rhabdomylolysis-creatinine andsodium also came down  His baseline.  He was seen by cardiology They did not think he had an acute cardiac event--0they recommended an event monitor because of his intermittent bradycardia  He has since seen cardiology and apparently thought to be stable.  He was started on Aricept for dementia-and thought to be unsafe to stay by himself-he has improved with therapy and apparently is now using a walker.  In regards to chronic kidney disease--His creatinine has stayed above 2-despite the stoppage of his lisinopril and Maxzide--he was started backn Lasix for lower extremity edema and weight gain.  Updated labs done this week showed a mild elevation is his creatinine up to 2.29 with BUN of 63previous creatinines were 2.14-2.06-at one point was 2.26  At one point his Lasix was increased for short-term for increased edema-but now he is back on 20 mg a day-his weight appears relatively stabilized at 192 pounds there have fluctuations    He did not have an event monitor placed because he didn't keep it on because  Of his dementia-he has not had any syncopal episodes.  When I saw  him 2 days ago his blood pressure was somewhat elevated in he was started on hydralazine 25 mg twice a day in addition to the Norvasc 5 mg he was already on-blood pressure today appears to be improved at 142/68  In regards to renal insufficiency creatinine on lab done today shows relative stability with earlier in the week ith a creatinine of 2.36 BUN of 59 potassium is 5.1 which is unchangedfrom earlier in the week    He is doing well wheeling about facility in his wheelchair today   Past Medical History:  Diagnosis Date  . Dementia   . Diabetes mellitus without complication ([place])   . Encephalopathy   . Gastritis 2004  . GIB (gastrointestinal bleeding) 2004  . Hypertension   . Poor historian   . Sinus bradycardia seen on cardiac monitor    Past Surgical History:  Procedure Laterality Date  . INGUINAL HERNIA REPAIR  09/2010   Bilateral  . UPPER GASTROINTESTINAL ENDOSCOPY  2004    No Known Allergies         Medication Sig  . amLODipine (NORVASC) 5 MG tablet Take 5 mg by mouth daily.  Roseanne Kaufman Peru-Castor Oil (VENELEX) OINT Apply liberal amount to coccyx, sacrum and bilateral buttocks q shift and prn after incontinence episodes for skin protection  . donepezil (ARICEPT) 5 MG tablet Take 5 mg by mouth at bedtime.  . furosemide (LASIX) 20 MG tablet Take 20 mg by mouth daily. Starting 10/01/2017  . pioglitazone (ACTOS) 15 MG tablet Take 1 tablet by  mouth daily.  . polyethylene glycol (MIRALAX / GLYCOLAX) packet Take 17 g by mouth daily as needed for mild constipation.  . simvastatin (ZOCOR) 40 MG tablet Take 20 mg by mouth daily.   . [DISCONTINUED] furosemide (LASIX) 40 MG tablet Take 40 mg by mouth daily. Starting 09/28/2017-09/30/2017   No facility-administered encounter medications on file as of 10/05/2017.     Review of Systems   This is somewhat limited secondary to dementia provided by nursing as wel.  In general is not complaining of any fever  chills.  Skin does not complain of rashes itching or diaphoresis.  Head ears eyes nose mouth and throat is not complaining of any visual changes or sore throat.  Respiratory does not complain of shortness of breath or cough.  Cardiac is not complaining of any chest pain has baseline lower extremity edema.  GI is not complaining of abdominal discomfort nausea vomiting diarrhea constipation.  Musculoskeletal continues to ambulate largely in a wheelchair is not really complaining of joint pain.  Neurologic does not complain of dizziness or headache.  And psych appears to be in good spirits does have some cognitive impairment   There is no immunization history on file for this patient.     Pertinent  Health Maintenance Due  Topic Date Due  . URINE MICROALBUMIN  10/11/2017 (Originally 07/01/1937)  . OPHTHALMOLOGY EXAM  11/05/2017 (Originally 07/01/1937)  . PNA vac Low Risk Adult (1 of 2 - PCV13) 11/05/2017 (Originally 06/30/1992)  . INFLUENZA VACCINE  12/14/2017  . HEMOGLOBIN A1C  02/24/2018  . FOOT EXAM  09/08/2018    Physical exam.  Temperature is 98.0-pulse 70- blood pressure 142/68-respirations 20- weight is stable at 192.4 pounds.  General this is a pleasant elderly male in no distress.  His skin is warm and dry.  Eyes visual acuity appears grossly intact sclera and conjunctive are clear.  Oropharynx is clear mucous membranes moist.  Chest is clear to auscultation there is no labored breathing.  Heart is regular rate and rhythm with an occasional irregular beat he has baseline 1+ lower extremity edema bilaterally.  Abdomen is soft nontender with positive bowel sounds.  Muscular skeletal moves all extremities x4 largely ambulating in wheelchair but is able to stand and ambulate but is quite unsteady.  Neurologic is grossly intact his speech is clear he does have lower extremity weakness he does not have true lateralizing findings.  Psych he is oriented to self  pleasant and appropriate.  Labs.   Oct 07, 2017.  WBC 5.9 hemoglobin 9.0 platelets 197.  Sodium 137 potassium 5.1-BUN 59-creatinine 2.36.  Assessment and plan.  1.  History of chronic renal insufficiency- creatinine remains in the higher end of his baseline but he appears to be stable potassium is stable at 5.1 this will need follow-up by her provider once he is discharged.  .  2.  Hypertension this appears to be relatively stable hydralazine just started 25 mg twice daily he is also on Norvasc 5 mg a day--- follow-up as well.  by primary care provider  3.  Type 2 diabetes this appears to be well controlled on Actos largely CBGs in the low 100s hemoglobin A1c was 6.1.  4.  Dementia he continues on Aricept if he goes home he will require 24-hour care apparently family is working on this.  5.  Diastolic CHF his weight appears to be stable continues on Lasix- again metabolic panel will to be monitored.  Addendum-it appears patient may not be going  home the family has appealed his discharge at this point will await those results clinically he appears to be stable as noted above.  [id number]

## 2017-10-08 ENCOUNTER — Encounter (HOSPITAL_COMMUNITY)
Admission: RE | Admit: 2017-10-08 | Discharge: 2017-10-08 | Disposition: A | Discharge status: Home / Self Care | Payer: Medicare Other | Source: Skilled Nursing Facility | Attending: *Deleted | Admitting: *Deleted

## 2017-10-08 ENCOUNTER — Encounter: Payer: Self-pay | Admitting: Internal Medicine

## 2017-10-08 DIAGNOSIS — R195 Other fecal abnormalities: Secondary | ICD-10-CM | POA: Diagnosis present

## 2017-10-08 DIAGNOSIS — T68XXXD Hypothermia, subsequent encounter: Secondary | ICD-10-CM | POA: Diagnosis present

## 2017-10-08 DIAGNOSIS — R41841 Cognitive communication deficit: Secondary | ICD-10-CM | POA: Diagnosis present

## 2017-10-08 DIAGNOSIS — R262 Difficulty in walking, not elsewhere classified: Secondary | ICD-10-CM | POA: Diagnosis present

## 2017-10-08 DIAGNOSIS — R279 Unspecified lack of coordination: Secondary | ICD-10-CM | POA: Diagnosis present

## 2017-10-08 DIAGNOSIS — I10 Essential (primary) hypertension: Secondary | ICD-10-CM | POA: Diagnosis not present

## 2017-10-08 LAB — CBC WITH DIFFERENTIAL/PLATELET
Basophils Absolute: 0 10*3/uL (ref 0.0–0.1)
Basophils Relative: 0 %
Eosinophils Absolute: 0.2 10*3/uL (ref 0.0–0.7)
Eosinophils Relative: 4 %
HCT: 28 % — ABNORMAL LOW (ref 39.0–52.0)
HEMOGLOBIN: 8.5 g/dL — AB (ref 13.0–17.0)
Lymphocytes Relative: 35 %
Lymphs Abs: 2.1 10*3/uL (ref 0.7–4.0)
MCH: 26.8 pg (ref 26.0–34.0)
MCHC: 30.4 g/dL (ref 30.0–36.0)
MCV: 88.3 fL (ref 78.0–100.0)
MONOS PCT: 9 %
Monocytes Absolute: 0.5 10*3/uL (ref 0.1–1.0)
NEUTROS ABS: 3.2 10*3/uL (ref 1.7–7.7)
Neutrophils Relative %: 52 %
Platelets: 185 10*3/uL (ref 150–400)
RBC: 3.17 MIL/uL — ABNORMAL LOW (ref 4.22–5.81)
RDW: 15 % (ref 11.5–15.5)
WBC: 6 10*3/uL (ref 4.0–10.5)

## 2017-10-08 LAB — BASIC METABOLIC PANEL
ANION GAP: 7 (ref 5–15)
BUN: 63 mg/dL — ABNORMAL HIGH (ref 6–20)
CHLORIDE: 108 mmol/L (ref 101–111)
CO2: 23 mmol/L (ref 22–32)
Calcium: 8.9 mg/dL (ref 8.9–10.3)
Creatinine, Ser: 2.48 mg/dL — ABNORMAL HIGH (ref 0.61–1.24)
GFR calc Af Amer: 25 mL/min — ABNORMAL LOW (ref >60)
GFR calc non Af Amer: 22 mL/min — ABNORMAL LOW (ref >60)
GLUCOSE: 109 mg/dL — AB (ref 65–99)
Potassium: 5.1 mmol/L (ref 3.5–5.1)
Sodium: 138 mmol/L (ref 135–145)

## 2017-10-10 NOTE — Addendum Note (Signed)
Addended by: Georgina Snell on: 10/10/2017 10:55 AM   Modules accepted: Level of Service

## 2017-10-17 ENCOUNTER — Encounter (HOSPITAL_COMMUNITY)
Admission: RE | Admit: 2017-10-17 | Discharge: 2017-10-17 | Disposition: A | Discharge status: Home / Self Care | Payer: Medicare Other | Source: Skilled Nursing Facility | Attending: Internal Medicine | Admitting: Internal Medicine

## 2017-10-17 DIAGNOSIS — N183 Chronic kidney disease, stage 3 (moderate): Secondary | ICD-10-CM | POA: Insufficient documentation

## 2017-10-17 DIAGNOSIS — R41841 Cognitive communication deficit: Secondary | ICD-10-CM | POA: Diagnosis present

## 2017-10-17 DIAGNOSIS — T68XXXD Hypothermia, subsequent encounter: Secondary | ICD-10-CM | POA: Diagnosis present

## 2017-10-17 DIAGNOSIS — R279 Unspecified lack of coordination: Secondary | ICD-10-CM | POA: Diagnosis present

## 2017-10-17 DIAGNOSIS — R262 Difficulty in walking, not elsewhere classified: Secondary | ICD-10-CM | POA: Diagnosis present

## 2017-10-17 DIAGNOSIS — R195 Other fecal abnormalities: Secondary | ICD-10-CM | POA: Insufficient documentation

## 2017-10-17 DIAGNOSIS — G9340 Encephalopathy, unspecified: Secondary | ICD-10-CM | POA: Insufficient documentation

## 2017-10-17 LAB — CBC WITH DIFFERENTIAL/PLATELET
BASOS ABS: 0 10*3/uL (ref 0.0–0.1)
Basophils Relative: 0 %
EOS PCT: 5 %
Eosinophils Absolute: 0.3 10*3/uL (ref 0.0–0.7)
HEMATOCRIT: 33.5 % — AB (ref 39.0–52.0)
Hemoglobin: 10 g/dL — ABNORMAL LOW (ref 13.0–17.0)
LYMPHS PCT: 28 %
Lymphs Abs: 1.7 10*3/uL (ref 0.7–4.0)
MCH: 26.8 pg (ref 26.0–34.0)
MCHC: 29.9 g/dL — AB (ref 30.0–36.0)
MCV: 89.8 fL (ref 78.0–100.0)
MONO ABS: 0.5 10*3/uL (ref 0.1–1.0)
MONOS PCT: 8 %
NEUTROS ABS: 3.6 10*3/uL (ref 1.7–7.7)
Neutrophils Relative %: 59 %
PLATELETS: 207 10*3/uL (ref 150–400)
RBC: 3.73 MIL/uL — ABNORMAL LOW (ref 4.22–5.81)
RDW: 14.8 % (ref 11.5–15.5)
WBC: 6.2 10*3/uL (ref 4.0–10.5)

## 2017-10-17 LAB — BASIC METABOLIC PANEL
Anion gap: 7 (ref 5–15)
BUN: 65 mg/dL — AB (ref 6–20)
CALCIUM: 9.4 mg/dL (ref 8.9–10.3)
CO2: 25 mmol/L (ref 22–32)
CREATININE: 2.46 mg/dL — AB (ref 0.61–1.24)
Chloride: 109 mmol/L (ref 101–111)
GFR calc Af Amer: 25 mL/min — ABNORMAL LOW (ref >60)
GFR, EST NON AFRICAN AMERICAN: 22 mL/min — AB (ref >60)
GLUCOSE: 137 mg/dL — AB (ref 65–99)
Potassium: 4.9 mmol/L (ref 3.5–5.1)
Sodium: 141 mmol/L (ref 135–145)

## 2017-10-24 ENCOUNTER — Encounter: Payer: Self-pay | Admitting: Internal Medicine

## 2017-10-24 ENCOUNTER — Encounter (HOSPITAL_COMMUNITY)
Admission: RE | Admit: 2017-10-24 | Discharge: 2017-10-24 | Disposition: A | Discharge status: Home / Self Care | Payer: Medicare Other | Source: Skilled Nursing Facility | Attending: Internal Medicine | Admitting: Internal Medicine

## 2017-10-24 ENCOUNTER — Non-Acute Institutional Stay (SKILLED_NURSING_FACILITY): Payer: Medicare Other | Admitting: Internal Medicine

## 2017-10-24 DIAGNOSIS — I10 Essential (primary) hypertension: Secondary | ICD-10-CM

## 2017-10-24 DIAGNOSIS — E118 Type 2 diabetes mellitus with unspecified complications: Secondary | ICD-10-CM

## 2017-10-24 DIAGNOSIS — N183 Chronic kidney disease, stage 3 (moderate): Secondary | ICD-10-CM | POA: Insufficient documentation

## 2017-10-24 DIAGNOSIS — F0390 Unspecified dementia without behavioral disturbance: Secondary | ICD-10-CM

## 2017-10-24 DIAGNOSIS — E1165 Type 2 diabetes mellitus with hyperglycemia: Secondary | ICD-10-CM

## 2017-10-24 DIAGNOSIS — N179 Acute kidney failure, unspecified: Secondary | ICD-10-CM | POA: Diagnosis not present

## 2017-10-24 DIAGNOSIS — IMO0002 Reserved for concepts with insufficient information to code with codable children: Secondary | ICD-10-CM

## 2017-10-24 LAB — IRON AND TIBC
IRON: 28 ug/dL — AB (ref 45–182)
SATURATION RATIOS: 8 % — AB (ref 17.9–39.5)
TIBC: 350 ug/dL (ref 250–450)
UIBC: 322 ug/dL

## 2017-10-24 LAB — HEMOGLOBIN AND HEMATOCRIT, BLOOD
HCT: 28.6 % — ABNORMAL LOW (ref 39.0–52.0)
Hemoglobin: 8.7 g/dL — ABNORMAL LOW (ref 13.0–17.0)

## 2017-10-24 LAB — RENAL FUNCTION PANEL
Albumin: 3.6 g/dL (ref 3.5–5.0)
Anion gap: 8 (ref 5–15)
BUN: 87 mg/dL — ABNORMAL HIGH (ref 6–20)
CO2: 22 mmol/L (ref 22–32)
Calcium: 8.9 mg/dL (ref 8.9–10.3)
Chloride: 109 mmol/L (ref 101–111)
Creatinine, Ser: 2.87 mg/dL — ABNORMAL HIGH (ref 0.61–1.24)
GFR, EST AFRICAN AMERICAN: 21 mL/min — AB (ref >60)
GFR, EST NON AFRICAN AMERICAN: 18 mL/min — AB (ref >60)
Glucose, Bld: 149 mg/dL — ABNORMAL HIGH (ref 65–99)
POTASSIUM: 4.9 mmol/L (ref 3.5–5.1)
Phosphorus: 4.5 mg/dL (ref 2.5–4.6)
Sodium: 139 mmol/L (ref 135–145)

## 2017-10-24 LAB — FOLATE: Folate: 16.1 ng/mL (ref >5.9)

## 2017-10-24 LAB — VITAMIN B12: Vitamin B-12: 427 pg/mL (ref 180–914)

## 2017-10-24 LAB — FERRITIN: Ferritin: 67 ng/mL (ref 24–336)

## 2017-10-24 NOTE — Progress Notes (Signed)
Location:   [facility] Room Number: 128/P Place of Service:  SNF (31) Provider:  Yetta Numbers, MD  Patient Care Team: Jani Gravel, MD as PCP - General (Internal Medicine) Satira Sark, MD as PCP - Cardiology (Cardiology)  Extended Emergency Contact Information Primary Emergency Contact: [place], [place] [address] Johnnette Litter of [place] Phone: [phone] Mobile Phone: [phone] Relation: Daughter Secondary Emergency Contact: Wynn Maudlin Address: [address]           [place], [place] [address] Johnnette Litter of [place] Phone: [phone] Mobile Phone: [phone] Relation: Son  Code Status:  Full Code Goals of care: Advanced Directive information Advanced Directives 10/24/2017  Does Patient Have a Medical Advance Directive? Yes  Type of Advance Directive (No Data)  Does patient want to make changes to medical advance directive? No - Patient declined  Would patient like information on creating a medical advance directive? No - Patient declined  Pre-existing out of facility DNR order (yellow form or pink MOST form) -     Chief Complaint  Patient presents with  . Acute Visit    Patient beng seen for Worsning Renal Function    HPI:  Pt is a 84 y.o. male seen today for an acute visit for Worsening Renal Function.and Hypertension  Patient is a 84 y.o.malewas admitted to SNF for therapy after staying in the hospital from 04/12-04/17 Patient Has h/o Hypertension, CKD, Diabetes Mellitus And dementia.  Patient was taken to the hospital after he was  found by his brother on the ground with a riding mower in the woods. .  his CT scan of the head which did not show any acute changes just atrophy . He did have moderate to severe spinal stenosis on his cervical spine CT. He also was hypernatremic with sodium of 146. Creatinine was 2.7 and had elevated troponin of 9.31 with CK of 22,000. He received IV  fluids which improved his rhabdomyolysis and creatinine and Sodium came down to his baseline.  Cardiology Saw him and don't think he had Acute cardiac Event.They did recommend an event monitor due to his Intermittent bradycardia In the facility patient has been noticed to have worsening of his creatinine from 1.75-2.87 now and BUN from 59- 87 now  .He Was taken off Maxzide and lisinopril.  Patient was continued  on Lasix due to his lower extremity edema.  His weight has not changed at all and is still 193 pounds. Patient has a history of dementia and is unable to give me any history.  But he denies any chest pain coughing shortness of breath.  He continues to have lower extremity edema.  He was seen By renal.  His ultrasound of kidneys is pending  Past Medical History:  Diagnosis Date  . Dementia   . Diabetes mellitus without complication ([place])   . Encephalopathy   . Gastritis 2004  . GIB (gastrointestinal bleeding) 2004  . Hypertension   . Poor historian   . Sinus bradycardia seen on cardiac monitor    Past Surgical History:  Procedure Laterality Date  . INGUINAL HERNIA REPAIR  09/2010   Bilateral  . UPPER GASTROINTESTINAL ENDOSCOPY  2004    No Known Allergies  Outpatient Encounter Medications as of 10/24/2017  Medication Sig  . amLODipine (NORVASC) 5 MG tablet Take 5 mg by mouth daily.  Roseanne Kaufman Peru-Castor Oil (VENELEX) OINT Apply liberal amount to coccyx, sacrum and  bilateral buttocks q shift and prn after incontinence episodes for skin protection  . donepezil (ARICEPT) 5 MG tablet Take 5 mg by mouth at bedtime.  . furosemide (LASIX) 20 MG tablet Take 20 mg by mouth daily. Starting 10/01/2017  . hydrALAZINE (APRESOLINE) 25 MG tablet Take 25 mg by mouth 2 (two) times daily.  . pioglitazone (ACTOS) 15 MG tablet Take 1 tablet by mouth daily.  . polyethylene glycol (MIRALAX / GLYCOLAX) packet Take 17 g by mouth daily as needed for mild constipation.  . simvastatin (ZOCOR) 40 MG tablet  Take 20 mg by mouth daily.    No facility-administered encounter medications on file as of 10/24/2017.      Review of Systems  Unable to perform ROS: Dementia     There is no immunization history on file for this patient. Pertinent  Health Maintenance Due  Topic Date Due  . OPHTHALMOLOGY EXAM  11/05/2017 (Originally 07/01/1937)  . PNA vac Low Risk Adult (1 of 2 - PCV13) 11/05/2017 (Originally 06/30/1992)  . URINE MICROALBUMIN  11/23/2017 (Originally 07/01/1937)  . INFLUENZA VACCINE  12/14/2017  . HEMOGLOBIN A1C  02/24/2018  . FOOT EXAM  09/08/2018   No flowsheet data found. Functional Status Survey:    Vitals:   10/24/17 1314  BP: (!) 187/50  Pulse: 77  Resp: (!) 22  Temp: 98 F (36.7 C)  TempSrc: Oral   There is no height or weight on file to calculate BMI. Physical Exam  Constitutional: He appears well-developed and well-nourished.  HENT:  Head: Normocephalic.  Mouth/Throat: Oropharynx is clear and moist.  Eyes: Pupils are equal, round, and reactive to light.  Neck: Neck supple.  Cardiovascular: Normal rate and regular rhythm.  No murmur heard. Pulmonary/Chest: Effort normal and breath sounds normal. No stridor. No respiratory distress. He has no wheezes.  Abdominal: Soft. Bowel sounds are normal. He exhibits no distension. There is no tenderness. There is no guarding.  Musculoskeletal:  Moderate Edema Bilateral  Neurological: He is alert.  Skin: Skin is warm and dry.  Psychiatric: He has a normal mood and affect. His behavior is normal. Thought content normal.    Labs reviewed: Recent Labs    12/27/16 1039  10/08/17 0742 10/17/17 0712 10/24/17 0300  NA 136   < > 138 141 139  K 4.9   < > 5.1 4.9 4.9  CL 107   < > 108 109 109  CO2 23   < > 23 25 22   GLUCOSE 127*   < > 109* 137* 149*  BUN 57*   < > 63* 65* 87*  CREATININE 2.70*   < > 2.48* 2.46* 2.87*  CALCIUM 8.8*   < > 8.9 9.4 8.9  MG 3.4*  --   --   --   --   PHOS  --   --   --   --  4.5   < > =  values in this interval not displayed.   Recent Labs    08/28/17 0634 08/30/17 0524 09/04/17 0529 10/24/17 0300  AST 104* 61* 26  --   ALT 64* 53 34  --   ALKPHOS 44 50 54  --   BILITOT 0.5 1.0 0.7  --   PROT 5.3* 5.6* 5.8*  --   ALBUMIN 2.6* 2.7* 3.0* 3.6   Recent Labs    10/07/17 0815 10/08/17 0742 10/17/17 0712 10/24/17 0300  WBC 5.9 6.0 6.2  --   NEUTROABS 3.6 3.2 3.6  --  HGB 9.0* 8.5* 10.0* 8.7*  HCT 28.9* 28.0* 33.5* 28.6*  MCV 88.7 88.3 89.8  --   PLT 197 185 207  --    Lab Results  Component Value Date   TSH 1.690 08/25/2017   Lab Results  Component Value Date   HGBA1C 6.4 (H) 08/25/2017   Lab Results  Component Value Date   CHOL 250 (H) 08/21/2013   HDL 69 08/21/2013   LDLCALC 169 (H) 08/21/2013   TRIG 59 08/21/2013   CHOLHDL 3.6 08/21/2013    Significant Diagnostic Results in last 30 days:  No results found.  Assessment/Plan Acute over chronic renal insufficiency We will hold his Lasix. Check him for PVR though per nurses he has not had any problems with retention Stat renal ultrasound Patient has a follow-up with nephrology Repeat BMP  Essential hypertension Patient is on amlodipine,  Lisinopril and Maxzide was discontinued due to his Renal Insufficieny Will increase his hydralazine to 50 mg 3 times daily Vital signs every shift  Anemia iron deficiency Related to CKD We will check stool We will start on iron Type 2 diabetes Accu Checks less then 150 Continue on Actos Hyperlipidemia Continue simvastatin Dementia Patient continues to be dependent for ADL Patient's family has not  made decision on his disposition he is stable on Aricept   Family/ staff Communication:   Labs/tests ordered:

## 2017-10-25 LAB — HCV RNA QUANT: HCV Quantitative: NOT DETECTED IU/mL (ref >50)

## 2017-10-25 LAB — VITAMIN D 25 HYDROXY (VIT D DEFICIENCY, FRACTURES): VIT D 25 HYDROXY: 22 ng/mL — AB (ref 30.0–100.0)

## 2017-10-25 LAB — COMPLEMENT, TOTAL

## 2017-10-25 LAB — ANA: Anti Nuclear Antibody(ANA): NEGATIVE

## 2017-10-25 LAB — HEPATITIS B SURFACE ANTIGEN: Hepatitis B Surface Ag: NEGATIVE

## 2017-10-25 LAB — ANCA TITERS: P-ANCA: <1:20 {titer}

## 2017-10-25 LAB — C4 COMPLEMENT: COMPLEMENT C4, BODY FLUID: 38 mg/dL (ref 14–44)

## 2017-10-25 LAB — MPO/PR-3 (ANCA) ANTIBODIES
ANCA Proteinase 3: <3.5 U/mL (ref 0.0–3.5)
Myeloperoxidase Abs: <9 U/mL (ref 0.0–9.0)

## 2017-10-25 LAB — PARATHYROID HORMONE, INTACT (NO CA): PTH: 56 pg/mL (ref 15–65)

## 2017-10-25 LAB — ANTI-DNA ANTIBODY, DOUBLE-STRANDED: ds DNA Ab: <1 IU/mL (ref 0–9)

## 2017-10-25 LAB — C3 COMPLEMENT: C3 Complement: 134 mg/dL (ref 82–167)

## 2017-10-26 ENCOUNTER — Encounter: Payer: Self-pay | Admitting: Internal Medicine

## 2017-10-26 ENCOUNTER — Other Ambulatory Visit (HOSPITAL_COMMUNITY)
Admission: AD | Admit: 2017-10-26 | Discharge: 2017-10-26 | Disposition: A | Discharge status: Home / Self Care | Payer: Medicare Other | Source: Skilled Nursing Facility | Attending: Internal Medicine | Admitting: Internal Medicine

## 2017-10-26 DIAGNOSIS — E1122 Type 2 diabetes mellitus with diabetic chronic kidney disease: Secondary | ICD-10-CM | POA: Insufficient documentation

## 2017-10-26 DIAGNOSIS — I129 Hypertensive chronic kidney disease with stage 1 through stage 4 chronic kidney disease, or unspecified chronic kidney disease: Secondary | ICD-10-CM | POA: Insufficient documentation

## 2017-10-26 DIAGNOSIS — N183 Chronic kidney disease, stage 3 (moderate): Secondary | ICD-10-CM | POA: Insufficient documentation

## 2017-10-26 LAB — HEPATITIS C ANTIBODY: HCV Ab: <0.1 s/co ratio (ref 0.0–0.9)

## 2017-10-26 LAB — HEPATITIS B CORE ANTIBODY, IGM: Hep B C IgM: NEGATIVE

## 2017-10-26 LAB — HEPATITIS B CORE ANTIBODY, TOTAL: HEP B C TOTAL AB: NEGATIVE

## 2017-10-26 LAB — HEPATITIS B SURFACE ANTIBODY,QUALITATIVE: Hep B S Ab: NONREACTIVE

## 2017-10-26 NOTE — Progress Notes (Signed)
Location:   [facility] Room Number: 128/P Place of Service:  SNF (31) Provider:  Yetta Numbers, MD  Patient Care Team: Jani Gravel, MD as PCP - General (Internal Medicine) Satira Sark, MD as PCP - Cardiology (Cardiology)  Extended Emergency Contact Information Primary Emergency Contact: [place], [place] [address] Johnnette Litter of [place] Phone: [phone] Mobile Phone: [phone] Relation: Daughter Secondary Emergency Contact: Wynn Maudlin Address: [address]           [place], [place] [address] Johnnette Litter of [place] Phone: [phone] Mobile Phone: [phone] Relation: Son  Code Status:  Full Code Goals of care: Advanced Directive information Advanced Directives 10/26/2017  Does Patient Have a Medical Advance Directive? Yes  Type of Advance Directive (No Data)  Does patient want to make changes to medical advance directive? No - Patient declined  Would patient like information on creating a medical advance directive? No - Patient declined  Pre-existing out of facility DNR order (yellow form or pink MOST form) -     Chief Complaint  Patient presents with  . Acute Visit    Patient being seen for Renal Function    HPI:  Pt is a 84 y.o. male seen today for an acute visit for    Past Medical History:  Diagnosis Date  . Dementia   . Diabetes mellitus without complication ([place])   . Encephalopathy   . Gastritis 2004  . GIB (gastrointestinal bleeding) 2004  . Hypertension   . Poor historian   . Sinus bradycardia seen on cardiac monitor    Past Surgical History:  Procedure Laterality Date  . INGUINAL HERNIA REPAIR  09/2010   Bilateral  . UPPER GASTROINTESTINAL ENDOSCOPY  2004    No Known Allergies  Outpatient Encounter Medications as of 10/26/2017  Medication Sig  . amLODipine (NORVASC) 5 MG tablet Take 5 mg by mouth daily.  Roseanne Kaufman Peru-Castor Oil (VENELEX) OINT Apply liberal  amount to coccyx, sacrum and bilateral buttocks q shift and prn after incontinence episodes for skin protection  . donepezil (ARICEPT) 5 MG tablet Take 5 mg by mouth at bedtime.  . hydrALAZINE (APRESOLINE) 25 MG tablet Take 50 mg by mouth 3 (three) times daily.   . iron polysaccharides (NIFEREX) 150 MG capsule Take 150 mg by mouth daily.  . pioglitazone (ACTOS) 15 MG tablet Take 1 tablet by mouth daily.  . polyethylene glycol (MIRALAX / GLYCOLAX) packet Take 17 g by mouth daily as needed for mild constipation.  . simvastatin (ZOCOR) 40 MG tablet Take 20 mg by mouth daily.   . [DISCONTINUED] furosemide (LASIX) 20 MG tablet Take 20 mg by mouth daily. Starting 10/01/2017   No facility-administered encounter medications on file as of 10/26/2017.      Review of Systems   There is no immunization history on file for this patient. Pertinent  Health Maintenance Due  Topic Date Due  . OPHTHALMOLOGY EXAM  11/05/2017 (Originally 07/01/1937)  . PNA vac Low Risk Adult (1 of 2 - PCV13) 11/05/2017 (Originally 06/30/1992)  . URINE MICROALBUMIN  11/23/2017 (Originally 07/01/1937)  . INFLUENZA VACCINE  12/14/2017  . HEMOGLOBIN A1C  02/24/2018  . FOOT EXAM  09/08/2018   No flowsheet data found. Functional Status Survey:    There were no vitals filed for this visit. There is no height or weight on file to calculate BMI. Physical Exam  Labs reviewed: Recent Labs  12/27/16 1039  10/08/17 0742 10/17/17 0712 10/24/17 0300  NA 136   < > 138 141 139  K 4.9   < > 5.1 4.9 4.9  CL 107   < > 108 109 109  CO2 23   < > 23 25 22   GLUCOSE 127*   < > 109* 137* 149*  BUN 57*   < > 63* 65* 87*  CREATININE 2.70*   < > 2.48* 2.46* 2.87*  CALCIUM 8.8*   < > 8.9 9.4 8.9  MG 3.4*  --   --   --   --   PHOS  --   --   --   --  4.5   < > = values in this interval not displayed.   Recent Labs    08/28/17 0634 08/30/17 0524 09/04/17 0529 10/24/17 0300  AST 104* 61* 26  --   ALT 64* 53 34  --   ALKPHOS 44  50 54  --   BILITOT 0.5 1.0 0.7  --   PROT 5.3* 5.6* 5.8*  --   ALBUMIN 2.6* 2.7* 3.0* 3.6   Recent Labs    10/07/17 0815 10/08/17 0742 10/17/17 0712 10/24/17 0300  WBC 5.9 6.0 6.2  --   NEUTROABS 3.6 3.2 3.6  --   HGB 9.0* 8.5* 10.0* 8.7*  HCT 28.9* 28.0* 33.5* 28.6*  MCV 88.7 88.3 89.8  --   PLT 197 185 207  --    Lab Results  Component Value Date   TSH 1.690 08/25/2017   Lab Results  Component Value Date   HGBA1C 6.4 (H) 08/25/2017   Lab Results  Component Value Date   CHOL 250 (H) 08/21/2013   HDL 69 08/21/2013   LDLCALC 169 (H) 08/21/2013   TRIG 59 08/21/2013   CHOLHDL 3.6 08/21/2013    Significant Diagnostic Results in last 30 days:  No results found.  Assessment/Plan There are no diagnoses linked to this encounter.   Family/ staff Communication:  Labs/tests ordered:    This encounter was created in error - please disregard.

## 2017-10-27 ENCOUNTER — Encounter (HOSPITAL_COMMUNITY)
Admission: RE | Admit: 2017-10-27 | Discharge: 2017-10-27 | Disposition: A | Discharge status: Home / Self Care | Payer: Medicare Other | Source: Skilled Nursing Facility | Attending: Internal Medicine | Admitting: Internal Medicine

## 2017-10-27 DIAGNOSIS — I10 Essential (primary) hypertension: Secondary | ICD-10-CM | POA: Insufficient documentation

## 2017-10-27 DIAGNOSIS — T68XXXD Hypothermia, subsequent encounter: Secondary | ICD-10-CM | POA: Insufficient documentation

## 2017-10-27 DIAGNOSIS — R41841 Cognitive communication deficit: Secondary | ICD-10-CM | POA: Diagnosis present

## 2017-10-27 DIAGNOSIS — R195 Other fecal abnormalities: Secondary | ICD-10-CM | POA: Insufficient documentation

## 2017-10-27 DIAGNOSIS — R279 Unspecified lack of coordination: Secondary | ICD-10-CM | POA: Diagnosis present

## 2017-10-27 DIAGNOSIS — G9340 Encephalopathy, unspecified: Secondary | ICD-10-CM | POA: Insufficient documentation

## 2017-10-27 DIAGNOSIS — R262 Difficulty in walking, not elsewhere classified: Secondary | ICD-10-CM | POA: Insufficient documentation

## 2017-10-27 LAB — BASIC METABOLIC PANEL
Anion gap: 8 (ref 5–15)
BUN: 63 mg/dL — AB (ref 6–20)
CHLORIDE: 111 mmol/L (ref 101–111)
CO2: 24 mmol/L (ref 22–32)
Calcium: 9.2 mg/dL (ref 8.9–10.3)
Creatinine, Ser: 2.34 mg/dL — ABNORMAL HIGH (ref 0.61–1.24)
GFR, EST AFRICAN AMERICAN: 27 mL/min — AB (ref >60)
GFR, EST NON AFRICAN AMERICAN: 23 mL/min — AB (ref >60)
Glucose, Bld: 150 mg/dL — ABNORMAL HIGH (ref 65–99)
POTASSIUM: 4.7 mmol/L (ref 3.5–5.1)
SODIUM: 143 mmol/L (ref 135–145)

## 2017-10-27 LAB — URINALYSIS, ROUTINE W REFLEX MICROSCOPIC
Bilirubin Urine: NEGATIVE
Glucose, UA: NEGATIVE mg/dL
Ketones, ur: NEGATIVE mg/dL
Leukocytes, UA: NEGATIVE
Nitrite: NEGATIVE
PROTEIN: 30 mg/dL — AB
SPECIFIC GRAVITY, URINE: 1.016 (ref 1.005–1.030)
pH: 5 (ref 5.0–8.0)

## 2017-10-28 ENCOUNTER — Encounter (HOSPITAL_COMMUNITY)
Admission: RE | Admit: 2017-10-28 | Discharge: 2017-10-28 | Disposition: A | Discharge status: Home / Self Care | Payer: Medicare Other | Source: Skilled Nursing Facility | Attending: Internal Medicine | Admitting: Internal Medicine

## 2017-10-28 DIAGNOSIS — N183 Chronic kidney disease, stage 3 (moderate): Secondary | ICD-10-CM | POA: Diagnosis not present

## 2017-10-28 LAB — BASIC METABOLIC PANEL
BUN: 61 mg/dL — ABNORMAL HIGH (ref 6–20)
CALCIUM: 8.8 mg/dL — AB (ref 8.9–10.3)
CHLORIDE: 112 mmol/L — AB (ref 101–111)
CREATININE: 2.39 mg/dL — AB (ref 0.61–1.24)
GFR calc Af Amer: 26 mL/min — ABNORMAL LOW (ref >60)
GFR, EST NON AFRICAN AMERICAN: 23 mL/min — AB (ref >60)
Glucose, Bld: 125 mg/dL — ABNORMAL HIGH (ref 65–99)
Potassium: 4.9 mmol/L (ref 3.5–5.1)
SODIUM: 140 mmol/L (ref 135–145)

## 2017-10-28 LAB — URINE CULTURE: CULTURE: NO GROWTH

## 2017-11-06 ENCOUNTER — Non-Acute Institutional Stay (SKILLED_NURSING_FACILITY): Payer: Medicare Other | Admitting: Internal Medicine

## 2017-11-06 ENCOUNTER — Encounter: Payer: Self-pay | Admitting: Internal Medicine

## 2017-11-06 DIAGNOSIS — E118 Type 2 diabetes mellitus with unspecified complications: Secondary | ICD-10-CM

## 2017-11-06 DIAGNOSIS — E1165 Type 2 diabetes mellitus with hyperglycemia: Secondary | ICD-10-CM

## 2017-11-06 DIAGNOSIS — I10 Essential (primary) hypertension: Secondary | ICD-10-CM

## 2017-11-06 DIAGNOSIS — R410 Disorientation, unspecified: Secondary | ICD-10-CM | POA: Diagnosis not present

## 2017-11-06 DIAGNOSIS — I5033 Acute on chronic diastolic (congestive) heart failure: Secondary | ICD-10-CM

## 2017-11-06 DIAGNOSIS — IMO0002 Reserved for concepts with insufficient information to code with codable children: Secondary | ICD-10-CM

## 2017-11-06 NOTE — Progress Notes (Signed)
Location:   [facility] Room Number: 128/P Place of Service:  SNF [phone]) Provider:  Yetta Numbers, MD  Patient Care Team: Jani Gravel, MD as PCP - General (Internal Medicine) Satira Sark, MD as PCP - Cardiology (Cardiology)  Extended Emergency Contact Information Primary Emergency Contact: [place], [place] [address] Johnnette Litter of [place] Phone: [phone] Mobile Phone: [phone] Relation: Daughter Secondary Emergency Contact: Wynn Maudlin Address: [address]           [place], [place] [address] Johnnette Litter of [place] Phone: [phone] Mobile Phone: [phone] Relation: Son  Code Status:  Full Code Goals of care: Advanced Directive information Advanced Directives 11/06/2017  Does Patient Have a Medical Advance Directive? Yes  Type of Advance Directive (No Data)  Does patient want to make changes to medical advance directive? No - Patient declined  Would patient like information on creating a medical advance directive? No - Patient declined  Pre-existing out of facility DNR order (yellow form or pink MOST form) -     Chief Complaint  Patient presents with  . Acute Visit    Patient is showing more confusion and was foud out side of facility     HPI:  Pt is a 84 y.o. male seen today for an acute visit for Confusion noticed recently.  Patient Has h/o Hypertension, CKD, Diabetes Mellitus And dementia. He was admittedto SNF for therapy after staying in the hospital from 04/12-04/17 Patient wastaken to the hospital after he wasfound by his brother on the ground with a riding mower in the woods. .  his CT scan of the headwhichdid not show any acute changes just atrophy . He didhave moderate to severe spinal stenosis on his cervical spine CT.  Since then patient has been in the facility and is now wheelchair-bound.  He failed therapy because of his dementia. He also had some worsening of  renal function.  His Lasix was stopped on my last visit and his BUN and creatinine has stabilized. Nurses have noticed worsening confusion for last few days.  They said he on the weekend he tried to escape the facility saying he wanted to go home.  He has a wander guard now. Patient unable to give me any history due to his dementia.  He states that he has been living here for many years and paying rent.  He denies leaving the facility. Patient denies any cough chest pain, fever or SOB  Past Medical History:  Diagnosis Date  . Dementia   . Diabetes mellitus without complication ([place])   . Encephalopathy   . Gastritis 2004  . GIB (gastrointestinal bleeding) 2004  . Hypertension   . Poor historian   . Sinus bradycardia seen on cardiac monitor    Past Surgical History:  Procedure Laterality Date  . INGUINAL HERNIA REPAIR  09/2010   Bilateral  . UPPER GASTROINTESTINAL ENDOSCOPY  2004    No Known Allergies  Outpatient Encounter Medications as of 11/06/2017  Medication Sig  . amLODipine (NORVASC) 5 MG tablet Take 5 mg by mouth daily.  Roseanne Kaufman Peru-Castor Oil (VENELEX) OINT Apply liberal amount to coccyx, sacrum and bilateral buttocks q shift and prn after incontinence episodes for skin protection  . donepezil (ARICEPT) 5 MG tablet Take 5 mg by mouth at bedtime.  . hydrALAZINE (APRESOLINE) 25 MG tablet Take 50 mg by mouth 3 (three) times daily.   . iron polysaccharides (  NIFEREX) 150 MG capsule Take 150 mg by mouth daily.  . pioglitazone (ACTOS) 15 MG tablet Take 1 tablet by mouth daily.  . polyethylene glycol (MIRALAX / GLYCOLAX) packet Take 17 g by mouth daily as needed for mild constipation.  . simvastatin (ZOCOR) 40 MG tablet Take 20 mg by mouth daily.    No facility-administered encounter medications on file as of 11/06/2017.      Review of Systems  Unable to perform ROS: Dementia       There is no immunization history on file for this patient. Pertinent  Health Maintenance  Due  Topic Date Due  . URINE MICROALBUMIN  11/23/2017 (Originally 07/01/1937)  . OPHTHALMOLOGY EXAM  12/06/2017 (Originally 07/01/1937)  . PNA vac Low Risk Adult (1 of 2 - PCV13) 12/06/2017 (Originally 06/30/1994)  . INFLUENZA VACCINE  12/14/2017  . HEMOGLOBIN A1C  02/24/2018  . FOOT EXAM  09/08/2018   No flowsheet data found. Functional Status Survey:    Vitals:   11/06/17 1034  BP: (!) 156/63  Pulse: 64  Resp: (!) 22  Temp: 97.8 F (36.6 C)  TempSrc: Oral   There is no height or weight on file to calculate BMI. Physical Exam  Constitutional: He appears well-developed and well-nourished.  HENT:  Head: Normocephalic.  Mouth/Throat: Oropharynx is clear and moist.  Eyes: Pupils are equal, round, and reactive to light.  Neck: Neck supple.  Cardiovascular: Normal rate and normal heart sounds. An irregular rhythm present.  Pulmonary/Chest: Effort normal and breath sounds normal. No stridor. No respiratory distress. He has no wheezes.  Abdominal: Soft. Bowel sounds are normal. He exhibits no distension. There is no tenderness. There is no guarding.  Musculoskeletal:  Moderate Edema Bilateral  Neurological: He is alert.  Knew his DOB. Didn't know date or Place No Focal deficits  Skin: Skin is warm and dry.  Psychiatric: He has a normal mood and affect. His behavior is normal. Thought content normal.    Labs reviewed: Recent Labs    12/27/16 1039  10/24/17 0300 10/27/17 0712 10/28/17 0705  NA 136   < > 139 143 140  K 4.9   < > 4.9 4.7 4.9  CL 107   < > 109 111 112*  CO2 23   < > 22 24 TEST NOT PERFORMED, REAGENT NOT AVAILABLE  GLUCOSE 127*   < > 149* 150* 125*  BUN 57*   < > 87* 63* 61*  CREATININE 2.70*   < > 2.87* 2.34* 2.39*  CALCIUM 8.8*   < > 8.9 9.2 8.8*  MG 3.4*  --   --   --   --   PHOS  --   --  4.5  --   --    < > = values in this interval not displayed.   Recent Labs    08/28/17 0634 08/30/17 0524 09/04/17 0529 10/24/17 0300  AST 104* 61* 26  --     ALT 64* 53 34  --   ALKPHOS 44 50 54  --   BILITOT 0.5 1.0 0.7  --   PROT 5.3* 5.6* 5.8*  --   ALBUMIN 2.6* 2.7* 3.0* 3.6   Recent Labs    10/07/17 0815 10/08/17 0742 10/17/17 0712 10/24/17 0300  WBC 5.9 6.0 6.2  --   NEUTROABS 3.6 3.2 3.6  --   HGB 9.0* 8.5* 10.0* 8.7*  HCT 28.9* 28.0* 33.5* 28.6*  MCV 88.7 88.3 89.8  --   PLT 197 185 207  --  Lab Results  Component Value Date   TSH 1.690 08/25/2017   Lab Results  Component Value Date   HGBA1C 6.4 (H) 08/25/2017   Lab Results  Component Value Date   CHOL 250 (H) 08/21/2013   HDL 69 08/21/2013   LDLCALC 169 (H) 08/21/2013   TRIG 59 08/21/2013   CHOLHDL 3.6 08/21/2013    Significant Diagnostic Results in last 30 days:  No results found.  Assessment/Plan Worsening confusion/mental status changes  At this time patient seems in his baseline. With no other symptoms I do not think there is any signs of infection We will repeat his labs Continue to monitor No need for repeat CT scan Patient has a Wander guard now. Continue Aricept Acute over chronic renal insufficiency His BUN and creatinine has stabilized since his Lasix was discontinued His weight stays stable at 194 lBs. His renal ultrasound is pending Follow-up with nephrology Essential hypertension Patientison amlodipine, Lisinopril and Maxzide was discontinued due to his Renal Insufficieny Blood pressure better controlled on hydralazine Anemia iron deficiency Related to CKD On iron Type 2 diabetes Accu Checks less then 150 Continue on Actos Hyperlipidemia Continue simvastatin Dementia Patient continues to be dependent for ADL Patient's family has not  made decision on his disposition  He is stable on Aricept    Family/ staff Communication:   Labs/tests ordered:  BMP and CBC

## 2017-11-07 ENCOUNTER — Encounter (HOSPITAL_COMMUNITY)
Admission: RE | Admit: 2017-11-07 | Discharge: 2017-11-07 | Disposition: A | Discharge status: Home / Self Care | Payer: Medicare Other | Source: Skilled Nursing Facility | Attending: *Deleted | Admitting: *Deleted

## 2017-11-07 DIAGNOSIS — N183 Chronic kidney disease, stage 3 (moderate): Secondary | ICD-10-CM | POA: Diagnosis not present

## 2017-11-07 LAB — CBC
HCT: 30.8 % — ABNORMAL LOW (ref 39.0–52.0)
Hemoglobin: 9.4 g/dL — ABNORMAL LOW (ref 13.0–17.0)
MCH: 26.5 pg (ref 26.0–34.0)
MCHC: 30.5 g/dL (ref 30.0–36.0)
MCV: 86.8 fL (ref 78.0–100.0)
Platelets: 212 10*3/uL (ref 150–400)
RBC: 3.55 MIL/uL — AB (ref 4.22–5.81)
RDW: 14.4 % (ref 11.5–15.5)
WBC: 6.8 10*3/uL (ref 4.0–10.5)

## 2017-11-07 LAB — BASIC METABOLIC PANEL
Anion gap: 8 (ref 5–15)
BUN: 50 mg/dL — AB (ref 8–23)
CO2: 21 mmol/L — AB (ref 22–32)
Calcium: 9.2 mg/dL (ref 8.9–10.3)
Chloride: 112 mmol/L — ABNORMAL HIGH (ref 98–111)
Creatinine, Ser: 2.39 mg/dL — ABNORMAL HIGH (ref 0.61–1.24)
GFR calc Af Amer: 26 mL/min — ABNORMAL LOW (ref >60)
GFR calc non Af Amer: 23 mL/min — ABNORMAL LOW (ref >60)
GLUCOSE: 122 mg/dL — AB (ref 70–99)
POTASSIUM: 4.7 mmol/L (ref 3.5–5.1)
Sodium: 141 mmol/L (ref 135–145)

## 2017-11-08 ENCOUNTER — Ambulatory Visit (HOSPITAL_COMMUNITY): Payer: Medicare Other

## 2017-11-13 ENCOUNTER — Ambulatory Visit (HOSPITAL_COMMUNITY)
Admit: 2017-11-13 | Discharge: 2017-11-13 | Disposition: A | Discharge status: Home / Self Care | Payer: Medicare Other | Attending: Medical | Admitting: Medical

## 2017-11-13 ENCOUNTER — Encounter (HOSPITAL_COMMUNITY): Payer: Self-pay

## 2017-11-15 ENCOUNTER — Non-Acute Institutional Stay (SKILLED_NURSING_FACILITY): Payer: Medicare Other | Admitting: Internal Medicine

## 2017-11-15 ENCOUNTER — Encounter: Payer: Self-pay | Admitting: Internal Medicine

## 2017-11-15 DIAGNOSIS — E1121 Type 2 diabetes mellitus with diabetic nephropathy: Secondary | ICD-10-CM

## 2017-11-15 DIAGNOSIS — R600 Localized edema: Secondary | ICD-10-CM

## 2017-11-15 DIAGNOSIS — N183 Chronic kidney disease, stage 3 (moderate): Secondary | ICD-10-CM

## 2017-11-15 DIAGNOSIS — I10 Essential (primary) hypertension: Secondary | ICD-10-CM

## 2017-11-15 DIAGNOSIS — N179 Acute kidney failure, unspecified: Secondary | ICD-10-CM

## 2017-11-15 NOTE — Progress Notes (Signed)
Location:   [facility] Room Number: 128/P Place of Service:  SNF [phone]) Provider:  Lucretia Field, MD  Patient Care Team: Jani Gravel, MD as PCP - General (Internal Medicine) Satira Sark, MD as PCP - Cardiology (Cardiology)  Extended Emergency Contact Information Primary Emergency Contact: Wynn Maudlin Address: [address]           [place], [place] [address] Johnnette Litter of [place] Phone: [phone] Mobile Phone: [phone] Relation: Son Secondary Emergency Contact: Mimmons,Carolyn          [place], [place] [address] Johnnette Litter of [place] Phone: [phone] Mobile Phone: [phone] Relation: Daughter  Code Status:  Full Code Goals of care: Advanced Directive information Advanced Directives 11/15/2017  Does Patient Have a Medical Advance Directive? Yes  Type of Advance Directive (No Data)  Does patient want to make changes to medical advance directive? No - Patient declined  Would patient like information on creating a medical advance directive? No - Patient declined  Pre-existing out of facility DNR order (yellow form or pink MOST form) -     Acute visit secondary to hypertension-follow-up edema  HPI:  Pt is a 84 y.o. male seen today for an acute visit for  Follow-up of hypertension-as well as edema.  Patient Has h/o Hypertension, CKD, Diabetes Mellitus And dementia. He was admittedto SNF for therapy after staying in the hospital from 04/12-04/17 Patient wastaken to the hospital after he wasfound by his brother on the ground with a riding mower in the woods. .  his CT scan of the headwhichdid not show any acute changes just atrophy . He didhave moderate to severe spinal stenosis on his cervical spine CT.  Since then patient has been in the facility and is now wheelchair-bound.  He failed therapy because of his dementia  He also has chronic kidney disease which appear to be worsening and his Lasix was held since  then BUN and creatinine have remained relatively stable.  At one point his lisinopril Maxzide were discontinued because of his renal insufficiency for hypertension he is now on Norvasc 5 mg a day and hydralazine 50 mg 3 times daily- hydralazine was recently increased about a month ago.  Edema appears to be relatively baseline his weight appears stable at around 194 pounds.  However I note his blood pressure this afternoon is somewhat elevated with a systolic of 595 diastolic 66- this was confirmed manually.  He does not complain of any headache or dizziness-or chest pain or shortness of breath  r review of blood pressures there is some variability she recent readings 124/75 this morning- 52/57-137/61-140/60-I also see a previous one with a systolic in the 638V     Past Medical History:  Diagnosis Date  . Dementia   . Diabetes mellitus without complication ([place])   . Encephalopathy   . Gastritis 2004  . GIB (gastrointestinal bleeding) 2004  . Hypertension   . Poor historian   . Sinus bradycardia seen on cardiac monitor    Past Surgical History:  Procedure Laterality Date  . INGUINAL HERNIA REPAIR  09/2010   Bilateral  . UPPER GASTROINTESTINAL ENDOSCOPY  2004    No Known Allergies  Outpatient Encounter Medications as of 11/15/2017  Medication Sig  . amLODipine (NORVASC) 5 MG tablet Take 5 mg by mouth daily.  Roseanne Kaufman Peru-Castor Oil (VENELEX) OINT Apply liberal amount to coccyx, sacrum and bilateral buttocks q shift and prn after incontinence episodes for skin protection  .  donepezil (ARICEPT) 5 MG tablet Take 5 mg by mouth at bedtime.  . hydrALAZINE (APRESOLINE) 25 MG tablet Take 50 mg by mouth 3 (three) times daily.   . iron polysaccharides (NIFEREX) 150 MG capsule Take 150 mg by mouth daily.  . pioglitazone (ACTOS) 15 MG tablet Take 1 tablet by mouth daily.  . polyethylene glycol (MIRALAX / GLYCOLAX) packet Take 17 g by mouth daily as needed for mild constipation.  .  simvastatin (ZOCOR) 40 MG tablet Take 20 mg by mouth daily.    No facility-administered encounter medications on file as of 11/15/2017.     Review of Systems   This is largely unobtainable secondary to dementia but is not complaining of any shortness of breath or pain at this time nursing does not report any issues reported he has chronic lower extremity edema at baseline--   There is no immunization history on file for this patient. Pertinent  Health Maintenance Due  Topic Date Due  . URINE MICROALBUMIN  11/23/2017 (Originally 07/01/1937)  . OPHTHALMOLOGY EXAM  12/06/2017 (Originally 07/01/1937)  . PNA vac Low Risk Adult (1 of 2 - PCV13) 12/06/2017 (Originally 06/30/1994)  . INFLUENZA VACCINE  12/14/2017  . HEMOGLOBIN A1C  02/24/2018  . FOOT EXAM  09/08/2018   No flowsheet data found. Functional Status Survey:    Vitals:   11/15/17 1439  BP: 124/75  Pulse: 67  Resp: 17  Temp: 98 F (36.7 C)  TempSrc: Oral  SpO2: 100%   Again blood pressure later today was 185/66 He does have variability as noted above  Physical Exam  In general this is a pleasant elderly male no distress sitting comfortably in his wheelchair.  Skin is warm and dry.  Eyes visual acuity appears to be intact sclera and conjunctive are clear.  Oropharynx is clear mucous membranes moist.  Chest is clear to auscultation there is no labored breathing.  Heart is regular irregular rate and rhythm without murmur gallop or rub he appears to have moderate lower extremity edema at baseline per staff.  Abdomen is soft nontender with positive bowel sounds.  Musculoskeletal is ambulatory in wheelchair moves all extremities at baseline.  Neurologic appears grossly intact his speech is clear no lateralizing findings.  Psych he is oriented to self is pleasant appropriate follows simple verbal commands- appears to be at baseline   Labs reviewed: Recent Labs    12/27/16 1039  10/24/17 0300 10/27/17 0712  10/28/17 0705 11/07/17 0700  NA 136   < > 139 143 140 141  K 4.9   < > 4.9 4.7 4.9 4.7  CL 107   < > 109 111 112* 112*  CO2 23   < > 22 24 TEST NOT PERFORMED, REAGENT NOT AVAILABLE 21*  GLUCOSE 127*   < > 149* 150* 125* 122*  BUN 57*   < > 87* 63* 61* 50*  CREATININE 2.70*   < > 2.87* 2.34* 2.39* 2.39*  CALCIUM 8.8*   < > 8.9 9.2 8.8* 9.2  MG 3.4*  --   --   --   --   --   PHOS  --   --  4.5  --   --   --    < > = values in this interval not displayed.   Recent Labs    08/28/17 0634 08/30/17 0524 09/04/17 0529 10/24/17 0300  AST 104* 61* 26  --   ALT 64* 53 34  --   ALKPHOS 44 50 54  --  BILITOT 0.5 1.0 0.7  --   PROT 5.3* 5.6* 5.8*  --   ALBUMIN 2.6* 2.7* 3.0* 3.6   Recent Labs    10/07/17 0815 10/08/17 0742 10/17/17 0712 10/24/17 0300 11/07/17 0700  WBC 5.9 6.0 6.2  --  6.8  NEUTROABS 3.6 3.2 3.6  --   --   HGB 9.0* 8.5* 10.0* 8.7* 9.4*  HCT 28.9* 28.0* 33.5* 28.6* 30.8*  MCV 88.7 88.3 89.8  --  86.8  PLT 197 185 207  --  212   Lab Results  Component Value Date   TSH 1.690 08/25/2017   Lab Results  Component Value Date   HGBA1C 6.4 (H) 08/25/2017   Lab Results  Component Value Date   CHOL 250 (H) 08/21/2013   HDL 69 08/21/2013   LDLCALC 169 (H) 08/21/2013   TRIG 59 08/21/2013   CHOLHDL 3.6 08/21/2013    Significant Diagnostic Results in last 30 days:  No results found.  Assessment/Plan  #1 hypertension- appears at times he has elevated systolics but not persistent- however he does have a systolic in the 916O today appears to occasionally happen.  I did discuss this with Dr. Linna Darner and will start diltiazem 90 mg twice daily- we will discontinue the Norvasc this might be contributing to his edema.  Continue hydralazine 50 mg 3 times daily.  Monitor blood pressure and pulse every shift log for provider review   #2 history of acute-chronic renal insufficiency this appears stabilized values- creatinine of 2.39 and BUN of 50 on most recent lab late  June Renal ultrasound is pending   3--iron deficiency anemia --e has been started on iron and this appears stable with hemoglobin of 9.4   #4- type 2 diabetes-continues on Actos blood sugars appear to be largely in the high 90s lower 100s   [id number]

## 2017-11-27 ENCOUNTER — Ambulatory Visit (HOSPITAL_COMMUNITY)
Admission: RE | Admit: 2017-11-27 | Discharge: 2017-11-27 | Disposition: A | Discharge status: Home / Self Care | Payer: Medicare Other | Source: Ambulatory Visit | Attending: Medical | Admitting: Medical

## 2017-11-27 DIAGNOSIS — R93429 Abnormal radiologic findings on diagnostic imaging of unspecified kidney: Secondary | ICD-10-CM | POA: Insufficient documentation

## 2017-11-27 DIAGNOSIS — N281 Cyst of kidney, acquired: Secondary | ICD-10-CM | POA: Insufficient documentation

## 2017-11-27 DIAGNOSIS — N183 Chronic kidney disease, stage 3 (moderate): Secondary | ICD-10-CM | POA: Diagnosis not present

## 2017-11-28 ENCOUNTER — Encounter (HOSPITAL_COMMUNITY)
Admission: RE | Admit: 2017-11-28 | Discharge: 2017-11-28 | Disposition: A | Discharge status: Home / Self Care | Payer: Medicare Other | Source: Ambulatory Visit | Attending: Nephrology | Admitting: Nephrology

## 2017-11-28 ENCOUNTER — Non-Acute Institutional Stay (SKILLED_NURSING_FACILITY): Payer: Medicare Other | Admitting: Internal Medicine

## 2017-11-28 ENCOUNTER — Other Ambulatory Visit (HOSPITAL_COMMUNITY)
Admission: AD | Admit: 2017-11-28 | Discharge: 2017-11-28 | Disposition: A | Discharge status: Home / Self Care | Payer: Medicare Other | Source: Skilled Nursing Facility | Attending: Internal Medicine | Admitting: Internal Medicine

## 2017-11-28 ENCOUNTER — Encounter: Payer: Self-pay | Admitting: Internal Medicine

## 2017-11-28 ENCOUNTER — Encounter (HOSPITAL_COMMUNITY): Payer: Self-pay

## 2017-11-28 DIAGNOSIS — I10 Essential (primary) hypertension: Secondary | ICD-10-CM | POA: Insufficient documentation

## 2017-11-28 DIAGNOSIS — E1165 Type 2 diabetes mellitus with hyperglycemia: Secondary | ICD-10-CM | POA: Diagnosis not present

## 2017-11-28 DIAGNOSIS — D509 Iron deficiency anemia, unspecified: Secondary | ICD-10-CM | POA: Diagnosis not present

## 2017-11-28 DIAGNOSIS — E118 Type 2 diabetes mellitus with unspecified complications: Secondary | ICD-10-CM

## 2017-11-28 DIAGNOSIS — R410 Disorientation, unspecified: Secondary | ICD-10-CM

## 2017-11-28 DIAGNOSIS — F0390 Unspecified dementia without behavioral disturbance: Secondary | ICD-10-CM | POA: Diagnosis not present

## 2017-11-28 DIAGNOSIS — N390 Urinary tract infection, site not specified: Secondary | ICD-10-CM | POA: Diagnosis not present

## 2017-11-28 DIAGNOSIS — IMO0002 Reserved for concepts with insufficient information to code with codable children: Secondary | ICD-10-CM

## 2017-11-28 LAB — CBC WITH DIFFERENTIAL/PLATELET
Basophils Absolute: 0 10*3/uL (ref 0.0–0.1)
Basophils Relative: 0 %
Eosinophils Absolute: 0 10*3/uL (ref 0.0–0.7)
Eosinophils Relative: 0 %
HCT: 35 % — ABNORMAL LOW (ref 39.0–52.0)
Hemoglobin: 10.9 g/dL — ABNORMAL LOW (ref 13.0–17.0)
Lymphocytes Relative: 12 %
Lymphs Abs: 1.2 10*3/uL (ref 0.7–4.0)
MCH: 26.5 pg (ref 26.0–34.0)
MCHC: 31.1 g/dL (ref 30.0–36.0)
MCV: 85.2 fL (ref 78.0–100.0)
Monocytes Absolute: 0.7 10*3/uL (ref 0.1–1.0)
Monocytes Relative: 7 %
Neutro Abs: 7.8 10*3/uL — ABNORMAL HIGH (ref 1.7–7.7)
Neutrophils Relative %: 81 %
Platelets: 233 10*3/uL (ref 150–400)
RBC: 4.11 MIL/uL — ABNORMAL LOW (ref 4.22–5.81)
RDW: 14.7 % (ref 11.5–15.5)
WBC: 9.7 10*3/uL (ref 4.0–10.5)

## 2017-11-28 LAB — COMPREHENSIVE METABOLIC PANEL
ALBUMIN: 4.6 g/dL (ref 3.5–5.0)
ALT: 13 U/L (ref 0–44)
ANION GAP: 12 (ref 5–15)
AST: 25 U/L (ref 15–41)
Alkaline Phosphatase: 73 U/L (ref 38–126)
BUN: 68 mg/dL — ABNORMAL HIGH (ref 8–23)
CHLORIDE: 105 mmol/L (ref 98–111)
CO2: 21 mmol/L — AB (ref 22–32)
Calcium: 9.6 mg/dL (ref 8.9–10.3)
Creatinine, Ser: 2.98 mg/dL — ABNORMAL HIGH (ref 0.61–1.24)
GFR calc Af Amer: 20 mL/min — ABNORMAL LOW (ref >60)
GFR calc non Af Amer: 17 mL/min — ABNORMAL LOW (ref >60)
GLUCOSE: 145 mg/dL — AB (ref 70–99)
POTASSIUM: 4.7 mmol/L (ref 3.5–5.1)
SODIUM: 138 mmol/L (ref 135–145)
TOTAL PROTEIN: 8 g/dL (ref 6.5–8.1)
Total Bilirubin: 0.8 mg/dL (ref 0.3–1.2)

## 2017-11-28 LAB — URINALYSIS, ROUTINE W REFLEX MICROSCOPIC
BACTERIA UA: NONE SEEN
Bilirubin Urine: NEGATIVE
Glucose, UA: NEGATIVE mg/dL
Hgb urine dipstick: NEGATIVE
Ketones, ur: NEGATIVE mg/dL
LEUKOCYTES UA: NEGATIVE
Nitrite: NEGATIVE
PROTEIN: 30 mg/dL — AB
Specific Gravity, Urine: 1.009 (ref 1.005–1.030)
pH: 5 (ref 5.0–8.0)

## 2017-11-28 MED ORDER — FERUMOXYTOL INJECTION 510 MG/17 ML
510.0000 mg | Freq: Once | INTRAVENOUS | Status: AC
  Administered 2017-11-28: 510 mg via INTRAVENOUS
  Filled 2017-11-28: qty 17

## 2017-11-28 MED ORDER — SODIUM CHLORIDE 0.9 % IV SOLN
INTRAVENOUS | Status: DC
  Administered 2017-11-28: 12:00:00 via INTRAVENOUS

## 2017-11-28 NOTE — Progress Notes (Signed)
Location:   [facility] Room Number: 128/P Place of Service:  SNF [phone]) Provider:  Veleta Miners MD  Jani Gravel, MD  Patient Care Team: Jani Gravel, MD as PCP - General (Internal Medicine) Satira Sark, MD as PCP - Cardiology (Cardiology)  Extended Emergency Contact Information Primary Emergency Contact: Wynn Maudlin Address: [address]           [place], [place] [address] Johnnette Litter of [place] Phone: [phone] Mobile Phone: [phone] Relation: Son Secondary Emergency Contact: Mimmons,Carolyn          [place], [place] [address] Johnnette Litter of [place] Phone: [phone] Mobile Phone: [phone] Relation: Daughter  Code Status:  Full Code Goals of care: Advanced Directive information Advanced Directives 11/28/2017  Does Patient Have a Medical Advance Directive? Yes  Type of Advance Directive (No Data)  Does patient want to make changes to medical advance directive? No - Patient declined  Would patient like information on creating a medical advance directive? No - Patient declined  Pre-existing out of facility DNR order (yellow form or pink MOST form) -     Chief Complaint  Patient presents with  . Acute Visit    Patient is being seen for Increased Confusion     HPI:  Pt is a 84 y.o. male seen today for an acute visit for Worsening Confusion.  Patient Has h/o Hypertension, CKD, Diabetes Mellitus And dementia. He was admittedto SNF for therapy after staying in the hospital from 04/12-04/17 For Acute Encephalopathy.and AKD  Patient has been in the facility he has been very confused.  He has not responded to any therapy.  The last few days patient has worsening.  He tries to escape from the facility and keeps saying he wants to go home.  He has not been sleeping well at night.  Has been refusing to eat.  Usually his family comes around and comes in today he got upset with them too.  Nurses wants to know if there is something to  calm him down.  His behavior is now hindering his day-to-day care. Patient does not have any fever, chills, abdominal pain ,nausea or vomiting. When I went to see the patient he was upset as he thinks his family wants his money. He wanted to know how soon he can go home.    Past Medical History:  Diagnosis Date  . Dementia   . Diabetes mellitus without complication ([place])   . Encephalopathy   . Gastritis 2004  . GIB (gastrointestinal bleeding) 2004  . Hypertension   . Poor historian   . Sinus bradycardia seen on cardiac monitor    Past Surgical History:  Procedure Laterality Date  . INGUINAL HERNIA REPAIR  09/2010   Bilateral  . UPPER GASTROINTESTINAL ENDOSCOPY  2004    No Known Allergies  Facility-Administered Encounter Medications as of 11/28/2017  Medication  . 0.9 %  sodium chloride infusion   Outpatient Encounter Medications as of 11/28/2017  Medication Sig  . Balsam Peru-Castor Oil (VENELEX) OINT Apply liberal amount to coccyx, sacrum and bilateral buttocks q shift and prn after incontinence episodes for skin protection  . diltiazem (CARDIZEM) 90 MG tablet Take 90 mg by mouth 2 (two) times daily.  Marland Kitchen donepezil (ARICEPT) 5 MG tablet Take 5 mg by mouth at bedtime.  . furosemide (LASIX) 40 MG tablet Take 40 mg by mouth.  . hydrALAZINE (APRESOLINE) 25 MG tablet Take 50 mg by mouth 3 (three) times daily.   Marland Kitchen iron  polysaccharides (NIFEREX) 150 MG capsule Take 150 mg by mouth daily.  . pioglitazone (ACTOS) 15 MG tablet Take 1 tablet by mouth daily.  . polyethylene glycol (MIRALAX / GLYCOLAX) packet Take 17 g by mouth daily as needed for mild constipation.  . Vitamin D, Cholecalciferol, 1000 units TABS Take 1 tablet by mouth once a day  . [DISCONTINUED] amLODipine (NORVASC) 5 MG tablet Take 5 mg by mouth daily.  . [DISCONTINUED] simvastatin (ZOCOR) 40 MG tablet Take 20 mg by mouth daily.      Review of Systems  Unable to perform ROS: Dementia     There is no immunization  history on file for this patient. Pertinent  Health Maintenance Due  Topic Date Due  . OPHTHALMOLOGY EXAM  12/06/2017 (Originally 07/01/1937)  . PNA vac Low Risk Adult (1 of 2 - PCV13) 12/06/2017 (Originally 06/30/1994)  . URINE MICROALBUMIN  12/29/2017 (Originally 07/01/1937)  . INFLUENZA VACCINE  12/14/2017  . HEMOGLOBIN A1C  02/24/2018  . FOOT EXAM  09/08/2018   No flowsheet data found. Functional Status Survey:    Vitals:   11/28/17 1721  BP: (!) 155/71  Pulse: (!) 50  Resp: 17  Temp: (!) 97.2 F (36.2 C)  SpO2: 98%   There is no height or weight on file to calculate BMI. Physical Exam  Constitutional: He appears well-developed and well-nourished.  HENT:  Head: Normocephalic.  Eyes: Pupils are equal, round, and reactive to light.  Neck: Neck supple.  Cardiovascular: Normal rate and regular rhythm.  Pulmonary/Chest: Effort normal and breath sounds normal. No stridor. No respiratory distress. He has no wheezes.  Abdominal: Soft. Bowel sounds are normal. He exhibits no distension. There is no tenderness. There is no guarding.  Musculoskeletal:  Moderate Edema Bilateral  Neurological: He is alert.  Not Oriented. Does follow commands but get Upset very easily. No Focal deficits.  Skin: Skin is warm and dry.  Psychiatric: His mood appears anxious. His affect is angry. His speech is rapid and/or pressured. He is agitated.    Labs reviewed: Recent Labs    12/27/16 1039  10/24/17 0300  10/28/17 0705 11/07/17 0700 11/28/17 1500  NA 136   < > 139   < > 140 141 138  K 4.9   < > 4.9   < > 4.9 4.7 4.7  CL 107   < > 109   < > 112* 112* 105  CO2 23   < > 22   < > TEST NOT PERFORMED, REAGENT NOT AVAILABLE 21* 21*  GLUCOSE 127*   < > 149*   < > 125* 122* 145*  BUN 57*   < > 87*   < > 61* 50* 68*  CREATININE 2.70*   < > 2.87*   < > 2.39* 2.39* 2.98*  CALCIUM 8.8*   < > 8.9   < > 8.8* 9.2 9.6  MG 3.4*  --   --   --   --   --   --   PHOS  --   --  4.5  --   --   --   --    < >  = values in this interval not displayed.   Recent Labs    08/30/17 0524 09/04/17 0529 10/24/17 0300 11/28/17 1500  AST 61* 26  --  25  ALT 53 34  --  13  ALKPHOS 50 54  --  73  BILITOT 1.0 0.7  --  0.8  PROT 5.6* 5.8*  --  8.0  ALBUMIN 2.7* 3.0* 3.6 4.6   Recent Labs    10/08/17 0742 10/17/17 0712 10/24/17 0300 11/07/17 0700 11/28/17 1500  WBC 6.0 6.2  --  6.8 9.7  NEUTROABS 3.2 3.6  --   --  7.8*  HGB 8.5* 10.0* 8.7* 9.4* 10.9*  HCT 28.0* 33.5* 28.6* 30.8* 35.0*  MCV 88.3 89.8  --  86.8 85.2  PLT 185 207  --  212 233   Lab Results  Component Value Date   TSH 1.690 08/25/2017   Lab Results  Component Value Date   HGBA1C 6.4 (H) 08/25/2017   Lab Results  Component Value Date   CHOL 250 (H) 08/21/2013   HDL 69 08/21/2013   LDLCALC 169 (H) 08/21/2013   TRIG 59 08/21/2013   CHOLHDL 3.6 08/21/2013    Significant Diagnostic Results in last 30 days:  US Renal  Result Date: 11/27/2017 CLINICAL DATA:  84 year old male with chronic kidney disease stage 3. Initial encounter. EXAM: RENAL / URINARY TRACT ULTRASOUND COMPLETE COMPARISON:  None. FINDINGS: Right Kidney: Length: 10.1 cm. Increased echogenicity renal parenchyma. No hydronephrosis. 1.2 cm cyst. Left Kidney: Length: 10.2 cm. Increased echogenicity renal parenchyma. No hydronephrosis. 1.7 cm and 2.1 cm cyst. Bladder: Appears normal for degree of bladder distention. IMPRESSION: Increased renal parenchyma echogenicity consistent with changes of medical renal disease. No hydronephrosis. Bilateral renal cysts. Electronically Signed   By: Genia Del M.D.   On: 11/27/2017 16:44    Assessment/Plan Worsening of Symptoms with Underline Dementia Will repeat BMP and CBC Repeat UA Will not repeat CT scan right now as it seems worsening of his Dementia Will start him on Buspar BID and Ativan QD for few days to see if it helps his Anxiety and Behavior. Also to d/w family for goals of care.  Essential hypertension On  diltiazem for her blood for his blood pressure control but he has a history of bradycardia.  Will discontinue diltiazem And start him on low-dose of clonidine. CKD Mild worsening on Labs today but overall stable Follows with Renal Anemia iron deficiency Related to CKD On iron Type 2 diabetes Accu Checks less then 150 Continue on Actos Hyperlipidemia Continue simvastatin Dementia Patient continues to be dependent for ADL Patient's familyhas notmade decision on his disposition  He is  on Aricept  Family/ staff Communication:   Labs/tests ordered:   Addendum  Repeat Labs did not show any acute Process UA and Cultures pending.

## 2017-11-29 ENCOUNTER — Encounter (HOSPITAL_COMMUNITY)
Admission: AD | Admit: 2017-11-29 | Discharge: 2017-11-29 | Disposition: A | Discharge status: Home / Self Care | Payer: Medicare Other | Source: Skilled Nursing Facility | Attending: Internal Medicine | Admitting: Internal Medicine

## 2017-11-29 DIAGNOSIS — R195 Other fecal abnormalities: Secondary | ICD-10-CM | POA: Diagnosis present

## 2017-11-29 DIAGNOSIS — I10 Essential (primary) hypertension: Secondary | ICD-10-CM | POA: Insufficient documentation

## 2017-11-29 DIAGNOSIS — R279 Unspecified lack of coordination: Secondary | ICD-10-CM | POA: Diagnosis present

## 2017-11-29 DIAGNOSIS — R41841 Cognitive communication deficit: Secondary | ICD-10-CM | POA: Insufficient documentation

## 2017-11-29 DIAGNOSIS — R262 Difficulty in walking, not elsewhere classified: Secondary | ICD-10-CM | POA: Insufficient documentation

## 2017-11-29 DIAGNOSIS — T68XXXD Hypothermia, subsequent encounter: Secondary | ICD-10-CM | POA: Insufficient documentation

## 2017-11-29 DIAGNOSIS — G9340 Encephalopathy, unspecified: Secondary | ICD-10-CM | POA: Diagnosis present

## 2017-11-29 LAB — CBC WITH DIFFERENTIAL/PLATELET
Basophils Absolute: 0 10*3/uL (ref 0.0–0.1)
Basophils Relative: 0 %
Eosinophils Absolute: 0.1 10*3/uL (ref 0.0–0.7)
Eosinophils Relative: 2 %
HCT: 30.1 % — ABNORMAL LOW (ref 39.0–52.0)
HEMOGLOBIN: 9.3 g/dL — AB (ref 13.0–17.0)
LYMPHS ABS: 1.5 10*3/uL (ref 0.7–4.0)
Lymphocytes Relative: 26 %
MCH: 26.3 pg (ref 26.0–34.0)
MCHC: 30.9 g/dL (ref 30.0–36.0)
MCV: 85 fL (ref 78.0–100.0)
Monocytes Absolute: 0.4 10*3/uL (ref 0.1–1.0)
Monocytes Relative: 8 %
NEUTROS ABS: 3.6 10*3/uL (ref 1.7–7.7)
NEUTROS PCT: 64 %
PLATELETS: 180 10*3/uL (ref 150–400)
RBC: 3.54 MIL/uL — ABNORMAL LOW (ref 4.22–5.81)
RDW: 14.6 % (ref 11.5–15.5)
WBC: 5.7 10*3/uL (ref 4.0–10.5)

## 2017-11-29 LAB — COMPREHENSIVE METABOLIC PANEL
ALT: 11 U/L (ref 0–44)
AST: 25 U/L (ref 15–41)
Albumin: 3.4 g/dL — ABNORMAL LOW (ref 3.5–5.0)
Alkaline Phosphatase: 56 U/L (ref 38–126)
Anion gap: 9 (ref 5–15)
BUN: 63 mg/dL — ABNORMAL HIGH (ref 8–23)
CHLORIDE: 111 mmol/L (ref 98–111)
CO2: 23 mmol/L (ref 22–32)
CREATININE: 2.78 mg/dL — AB (ref 0.61–1.24)
Calcium: 9.1 mg/dL (ref 8.9–10.3)
GFR calc non Af Amer: 19 mL/min — ABNORMAL LOW (ref >60)
GFR, EST AFRICAN AMERICAN: 22 mL/min — AB (ref >60)
Glucose, Bld: 107 mg/dL — ABNORMAL HIGH (ref 70–99)
POTASSIUM: 4.3 mmol/L (ref 3.5–5.1)
SODIUM: 143 mmol/L (ref 135–145)
Total Bilirubin: 0.8 mg/dL (ref 0.3–1.2)
Total Protein: 6.1 g/dL — ABNORMAL LOW (ref 6.5–8.1)

## 2017-11-30 LAB — URINE CULTURE: Culture: NO GROWTH

## 2017-12-05 ENCOUNTER — Encounter (HOSPITAL_COMMUNITY)
Admit: 2017-12-05 | Discharge: 2017-12-05 | Disposition: A | Discharge status: Home / Self Care | Payer: Medicare Other | Attending: Nephrology | Admitting: Nephrology

## 2017-12-05 ENCOUNTER — Encounter (HOSPITAL_COMMUNITY): Payer: Self-pay

## 2017-12-05 DIAGNOSIS — D509 Iron deficiency anemia, unspecified: Secondary | ICD-10-CM | POA: Diagnosis not present

## 2017-12-05 MED ORDER — SODIUM CHLORIDE 0.9 % IV SOLN
Freq: Once | INTRAVENOUS | Status: AC
  Administered 2017-12-05: 11:00:00 via INTRAVENOUS

## 2017-12-05 MED ORDER — SODIUM CHLORIDE 0.9 % IV SOLN
510.0000 mg | Freq: Once | INTRAVENOUS | Status: AC
  Administered 2017-12-05: 510 mg via INTRAVENOUS
  Filled 2017-12-05: qty 17

## 2017-12-08 ENCOUNTER — Other Ambulatory Visit: Payer: Self-pay

## 2017-12-08 MED ORDER — TEMAZEPAM 7.5 MG PO CAPS: 7.5000 mg | ORAL_CAPSULE | Freq: Every day | ORAL | 0 refills | Status: DC

## 2017-12-08 NOTE — Telephone Encounter (Signed)
RX Fax for [employer] [phone]   Script given to [facility] the nurse on [employer]

## 2017-12-11 ENCOUNTER — Other Ambulatory Visit: Payer: Self-pay

## 2017-12-11 MED ORDER — TEMAZEPAM 7.5 MG PO CAPS: 7.5000 mg | ORAL_CAPSULE | Freq: Every day | ORAL | 0 refills | Status: DC

## 2017-12-11 NOTE — Telephone Encounter (Signed)
RX Fax for [employer] [phone]  Script given to [facility] the nurse on duty

## 2017-12-21 ENCOUNTER — Non-Acute Institutional Stay (SKILLED_NURSING_FACILITY): Payer: Medicare Other | Admitting: Internal Medicine

## 2017-12-21 ENCOUNTER — Encounter: Payer: Self-pay | Admitting: Internal Medicine

## 2017-12-21 DIAGNOSIS — F0390 Unspecified dementia without behavioral disturbance: Secondary | ICD-10-CM | POA: Diagnosis not present

## 2017-12-21 DIAGNOSIS — R600 Localized edema: Secondary | ICD-10-CM | POA: Diagnosis not present

## 2017-12-21 DIAGNOSIS — D649 Anemia, unspecified: Secondary | ICD-10-CM | POA: Insufficient documentation

## 2017-12-21 DIAGNOSIS — I10 Essential (primary) hypertension: Secondary | ICD-10-CM

## 2017-12-21 DIAGNOSIS — IMO0002 Reserved for concepts with insufficient information to code with codable children: Secondary | ICD-10-CM

## 2017-12-21 DIAGNOSIS — N184 Chronic kidney disease, stage 4 (severe): Secondary | ICD-10-CM

## 2017-12-21 DIAGNOSIS — E1165 Type 2 diabetes mellitus with hyperglycemia: Secondary | ICD-10-CM

## 2017-12-21 DIAGNOSIS — D631 Anemia in chronic kidney disease: Secondary | ICD-10-CM

## 2017-12-21 DIAGNOSIS — E118 Type 2 diabetes mellitus with unspecified complications: Secondary | ICD-10-CM | POA: Diagnosis not present

## 2017-12-21 NOTE — Progress Notes (Signed)
Location:    [facility] Room Number: 128/P Place of Service:  SNF (31) Provider:Laqueisha Catalina Lyndel Safe MD   Jani Gravel, MD  Patient Care Team: Jani Gravel, MD as PCP - General (Internal Medicine) Satira Sark, MD as PCP - Cardiology (Cardiology)  Extended Emergency Contact Information Primary Emergency Contact: Wynn Maudlin Address: [address]           [place], [place] [address] Johnnette Litter of [place] Phone: [phone] Mobile Phone: [phone] Relation: Son Secondary Emergency Contact: Mimmons,Carolyn          [place], [place] [address] Johnnette Litter of [place] Phone: [phone] Mobile Phone: [phone] Relation: Daughter  Code Status: DNR Goals of care: Advanced Directive information Advanced Directives 12/21/2017  Does Patient Have a Medical Advance Directive? Yes  Type of Advance Directive (No Data)  Does patient want to make changes to medical advance directive? No - Patient declined  Would patient like information on creating a medical advance directive? No - Patient declined  Pre-existing out of facility DNR order (yellow form or pink MOST form) -     Chief Complaint  Patient presents with  . Medical Management of Chronic Issues    Patient is being seen for a routine visit of Medical Management , Patient is due Albumin, T-dap,and DM Eye Exam    HPI:  Pt is a 84 y.o. male seen today for medical management of chronic diseases.    Patient Has h/o Hypertension, CKD, Diabetes Mellitus And dementia. Hewas admittedto SNF for therapy after staying in the hospital from 04/12-04/17 For Acute Encephalopathy.and AKD  Since his family is unable to take patient home. He is now a long-term resident. Patient had been very confused in the facility.  He was getting worse Nurses were unable to Provide him Care . He was  started  on BuSpar and Restoril. Patient has done better since then.  He is more calmer and has not tried to escape the facility.   He is also letting nurses take care of him.  But he still continues to be very confused.  And gets very upset.  Like today he wanted to go to the bank and was looking for it in the facility. His appetite stays good and his weight is stable  at 193 lbs. Patient did not have any complaints Past Medical History:  Diagnosis Date  . Dementia   . Diabetes mellitus without complication ([place])   . Encephalopathy   . Gastritis 2004  . GIB (gastrointestinal bleeding) 2004  . Hypertension   . Poor historian   . Sinus bradycardia seen on cardiac monitor    Past Surgical History:  Procedure Laterality Date  . INGUINAL HERNIA REPAIR  09/2010   Bilateral  . UPPER GASTROINTESTINAL ENDOSCOPY  2004    No Known Allergies  Outpatient Encounter Medications as of 12/21/2017  Medication Sig  . Balsam [place]-Castor Oil (VENELEX) OINT Apply liberal amount to coccyx, sacrum and bilateral buttocks q shift and prn after incontinence episodes for skin protection  . busPIRone (BUSPAR) 5 MG tablet Take 5 mg by mouth 2 (two) times daily.  . cloNIDine (CATAPRES) 0.1 MG tablet Take 0.1 mg by mouth 2 (two) times daily.  Marland Kitchen donepezil (ARICEPT) 5 MG tablet Take 5 mg by mouth at bedtime.  . furosemide (LASIX) 40 MG tablet Take 40 mg by mouth.  . hydrALAZINE (APRESOLINE) 25 MG tablet Take 50 mg by mouth 3 (three) times daily.   . iron polysaccharides (NIFEREX) 150  MG capsule Take 150 mg by mouth daily.  . pioglitazone (ACTOS) 15 MG tablet Take 1 tablet by mouth daily.  . polyethylene glycol (MIRALAX / GLYCOLAX) packet Take 17 g by mouth daily as needed for mild constipation.  . temazepam (RESTORIL) 7.5 MG capsule Take 1 capsule (7.5 mg total) by mouth at bedtime.  . Vitamin D, Cholecalciferol, 1000 units TABS Take 1 tablet by mouth once a day  . [DISCONTINUED] diltiazem (CARDIZEM) 90 MG tablet Take 90 mg by mouth 2 (two) times daily.   No facility-administered encounter medications on file as of 12/21/2017.      Review  of Systems  Unable to perform ROS: Dementia     There is no immunization history on file for this patient. Pertinent  Health Maintenance Due  Topic Date Due  . URINE MICROALBUMIN  12/29/2017 (Originally 07/01/1937)  . INFLUENZA VACCINE  01/21/2018 (Originally 12/14/2017)  . OPHTHALMOLOGY EXAM  01/21/2018 (Originally 07/01/1937)  . PNA vac Low Risk Adult (1 of 2 - PCV13) 01/21/2018 (Originally 06/30/1994)  . HEMOGLOBIN A1C  02/24/2018  . FOOT EXAM  09/08/2018   No flowsheet data found. Functional Status Survey:    Vitals:   12/21/17 1152  BP: (!) 127/54  Pulse: 66  Resp: 20  Temp: 97.6 F (36.4 C)  TempSrc: Oral  SpO2: 100%  Weight: 192 lb (87.1 kg)  Height: 5' 5.6" (1.666 m)   Body mass index is 31.37 kg/m. Physical Exam  Constitutional: He appears well-developed and well-nourished.  HENT:  Head: Normocephalic.  Eyes: Pupils are equal, round, and reactive to light.  Neck: Neck supple.  Cardiovascular: Normal rate and regular rhythm.  Pulmonary/Chest: Effort normal and breath sounds normal. No stridor. No respiratory distress. He has no wheezes.  Abdominal: Soft. Bowel sounds are normal. He exhibits no distension. There is no tenderness. There is no guarding.  Musculoskeletal:  Moderate Edema Bilateral  Neurological: He is alert.  Not Oriented. Does follow commands but get Upset very easily. No Focal deficits.  Skin: Skin is warm and dry.  Psychiatric: He has a normal mood and affect. His speech is normal and behavior is normal.    Labs reviewed: Recent Labs    12/27/16 1039  10/24/17 0300  11/07/17 0700 11/28/17 1500 11/29/17 0700  NA 136   < > 139   < > 141 138 143  K 4.9   < > 4.9   < > 4.7 4.7 4.3  CL 107   < > 109   < > 112* 105 111  CO2 23   < > 22   < > 21* 21* 23  GLUCOSE 127*   < > 149*   < > 122* 145* 107*  BUN 57*   < > 87*   < > 50* 68* 63*  CREATININE 2.70*   < > 2.87*   < > 2.39* 2.98* 2.78*  CALCIUM 8.8*   < > 8.9   < > 9.2 9.6 9.1  MG 3.4*   --   --   --   --   --   --   PHOS  --   --  4.5  --   --   --   --    < > = values in this interval not displayed.   Recent Labs    09/04/17 0529 10/24/17 0300 11/28/17 1500 11/29/17 0700  AST 26  --  25 25  ALT 34  --  13 11  ALKPHOS 54  --  73 56  BILITOT 0.7  --  0.8 0.8  PROT 5.8*  --  8.0 6.1*  ALBUMIN 3.0* 3.6 4.6 3.4*   Recent Labs    10/17/17 0712  11/07/17 0700 11/28/17 1500 11/29/17 0700  WBC 6.2  --  6.8 9.7 5.7  NEUTROABS 3.6  --   --  7.8* 3.6  HGB 10.0*   < > 9.4* 10.9* 9.3*  HCT 33.5*   < > 30.8* 35.0* 30.1*  MCV 89.8  --  86.8 85.2 85.0  PLT 207  --  212 233 180   < > = values in this interval not displayed.   Lab Results  Component Value Date   TSH 1.690 08/25/2017   Lab Results  Component Value Date   HGBA1C 6.4 (H) 08/25/2017   Lab Results  Component Value Date   CHOL 250 (H) 08/21/2013   HDL 69 08/21/2013   LDLCALC 169 (H) 08/21/2013   TRIG 59 08/21/2013   CHOLHDL 3.6 08/21/2013    Significant Diagnostic Results in last 30 days:  US Renal  Result Date: 11/27/2017 CLINICAL DATA:  84 year old male with chronic kidney disease stage 3. Initial encounter. EXAM: RENAL / URINARY TRACT ULTRASOUND COMPLETE COMPARISON:  None. FINDINGS: Right Kidney: Length: 10.1 cm. Increased echogenicity renal parenchyma. No hydronephrosis. 1.2 cm cyst. Left Kidney: Length: 10.2 cm. Increased echogenicity renal parenchyma. No hydronephrosis. 1.7 cm and 2.1 cm cyst. Bladder: Appears normal for degree of bladder distention. IMPRESSION: Increased renal parenchyma echogenicity consistent with changes of medical renal disease. No hydronephrosis. Bilateral renal cysts. Electronically Signed   By: Genia Del M.D.   On: 11/27/2017 16:44    Assessment/Plan   Essential hypertension Clonidine, Lasix, hydralazine Has a history of bradycardia also cannot use beta-blocker or Diltiazem Blood pressure controlled CKD Mild worsening on Labs  but overall stable Follows with  Renal Renal US has been no acute changes Anemia iron deficiency Related to CKD To be started on IV Feraheme by Nephro Type 2 diabetes Accu Checks less then 150 Continue on Actos Due for A1C Hyperlipidemia Continue simvastatin Repeat Fasting Lipids Dementia With Behavior Patient continues to be dependent for ADL He has stabilized on Buspar and Restoril Continue on them  Ativan Discontinued Family/ staff Communication:   Labs/tests ordered:    Total time spent in this patient care encounter was 25_ minutes; greater than 50% of the visit spent counseling patient, reviewing records , Labs and coordinating care for problems addressed at this encounter.

## 2017-12-22 ENCOUNTER — Encounter (HOSPITAL_COMMUNITY)
Admission: RE | Admit: 2017-12-22 | Discharge: 2017-12-22 | Disposition: A | Discharge status: Home / Self Care | Payer: Medicare Other | Source: Skilled Nursing Facility | Attending: Internal Medicine | Admitting: Internal Medicine

## 2017-12-22 DIAGNOSIS — E1165 Type 2 diabetes mellitus with hyperglycemia: Secondary | ICD-10-CM | POA: Diagnosis not present

## 2017-12-22 LAB — LIPID PANEL
CHOL/HDL RATIO: 4.5 ratio
Cholesterol: 171 mg/dL (ref 0–200)
HDL: 38 mg/dL — ABNORMAL LOW (ref >40)
LDL CALC: 123 mg/dL — AB (ref 0–99)
Triglycerides: 49 mg/dL (ref <150)
VLDL: 10 mg/dL (ref 0–40)

## 2017-12-22 LAB — BASIC METABOLIC PANEL
Anion gap: 7 (ref 5–15)
BUN: 75 mg/dL — AB (ref 8–23)
CALCIUM: 9 mg/dL (ref 8.9–10.3)
CO2: 23 mmol/L (ref 22–32)
CREATININE: 3.01 mg/dL — AB (ref 0.61–1.24)
Chloride: 112 mmol/L — ABNORMAL HIGH (ref 98–111)
GFR calc Af Amer: 20 mL/min — ABNORMAL LOW (ref >60)
GFR, EST NON AFRICAN AMERICAN: 17 mL/min — AB (ref >60)
GLUCOSE: 118 mg/dL — AB (ref 70–99)
Potassium: 4.7 mmol/L (ref 3.5–5.1)
Sodium: 142 mmol/L (ref 135–145)

## 2017-12-22 LAB — HEMOGLOBIN A1C
Hgb A1c MFr Bld: 6.8 % — ABNORMAL HIGH (ref 4.8–5.6)
Mean Plasma Glucose: 148.46 mg/dL

## 2017-12-29 ENCOUNTER — Ambulatory Visit: Payer: Medicare Other | Admitting: Cardiology

## 2018-01-12 ENCOUNTER — Encounter (HOSPITAL_COMMUNITY)
Admission: AD | Admit: 2018-01-12 | Discharge: 2018-01-12 | Disposition: A | Discharge status: Home / Self Care | Payer: Medicare Other | Source: Skilled Nursing Facility | Attending: Internal Medicine | Admitting: Internal Medicine

## 2018-01-12 DIAGNOSIS — I129 Hypertensive chronic kidney disease with stage 1 through stage 4 chronic kidney disease, or unspecified chronic kidney disease: Secondary | ICD-10-CM | POA: Insufficient documentation

## 2018-01-12 DIAGNOSIS — T68XXXD Hypothermia, subsequent encounter: Secondary | ICD-10-CM | POA: Insufficient documentation

## 2018-01-12 DIAGNOSIS — R195 Other fecal abnormalities: Secondary | ICD-10-CM | POA: Insufficient documentation

## 2018-05-10 IMAGING — US US ABDOMEN LIMITED
1 series · 14 of 25 positions shown · non-contrast
Comparison: 05/11/2006 CT abdomen/pelvis.

CLINICAL DATA: Elevated liver function tests.

EXAM:
ULTRASOUND ABDOMEN LIMITED RIGHT UPPER QUADRANT

[Series 1: us abdomen limited · 0.24mm/px · 14 of 46 slices shown]
[im 1/46]
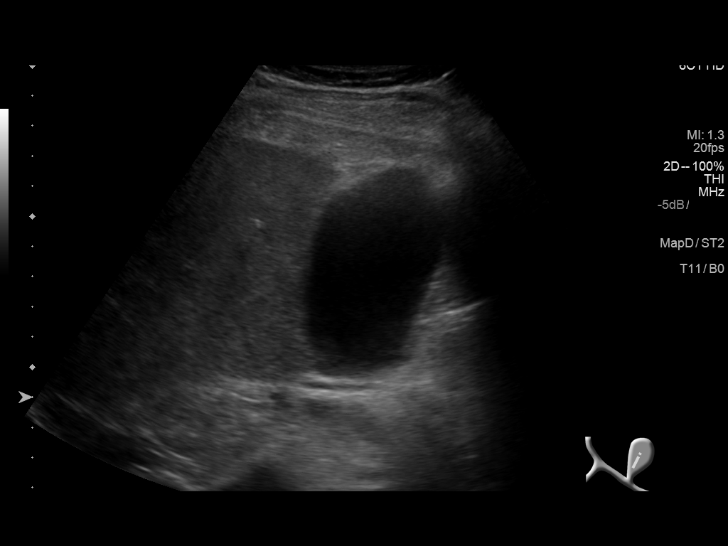
[im 4/46]
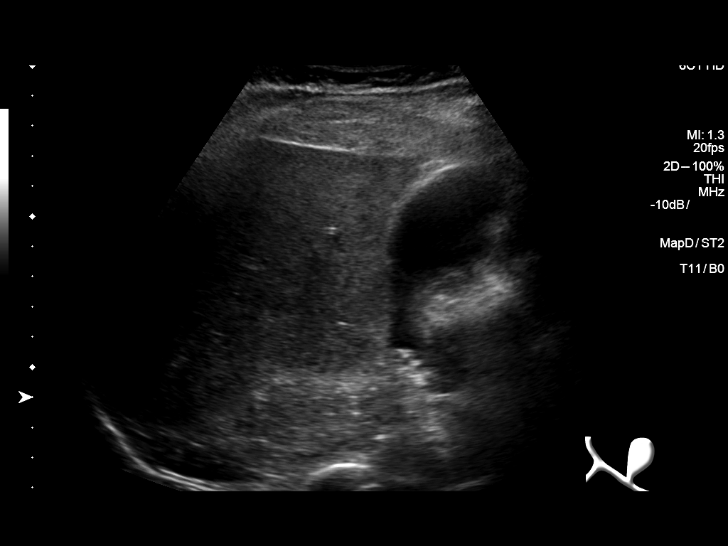
[im 8/46]
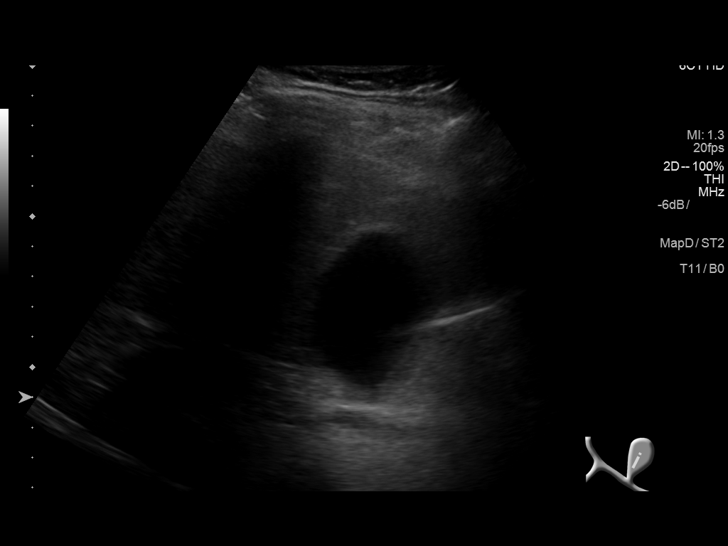
[im 12/46]
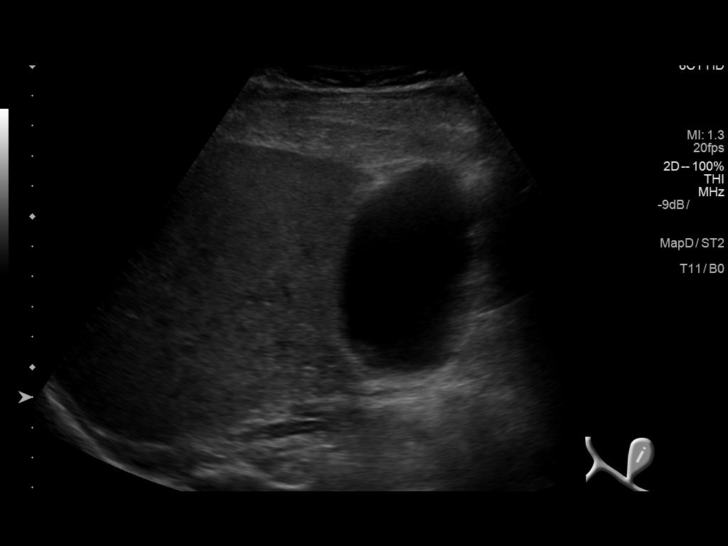
[im 16/46]
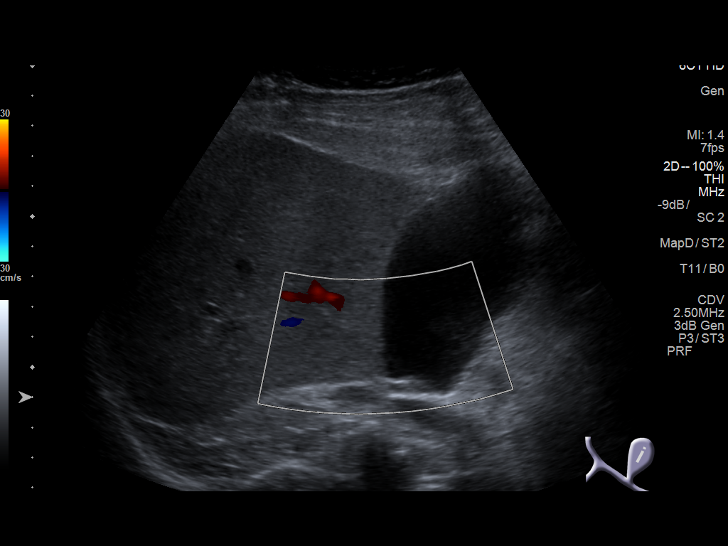
[im 17/46]
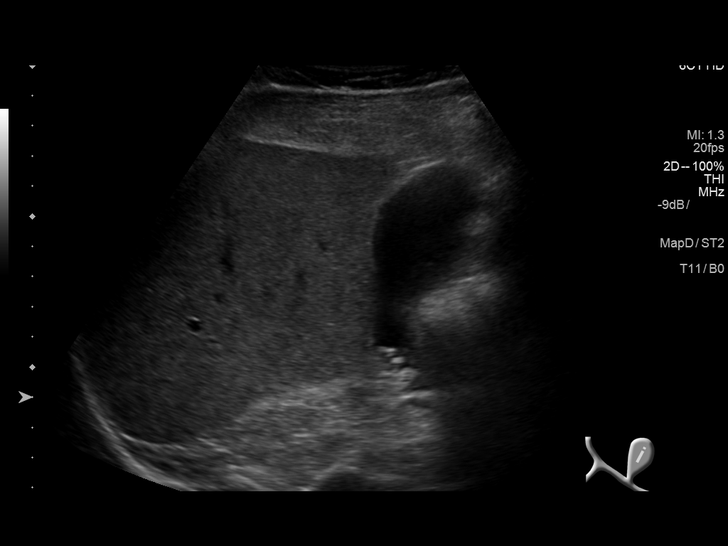
[im 21/46]
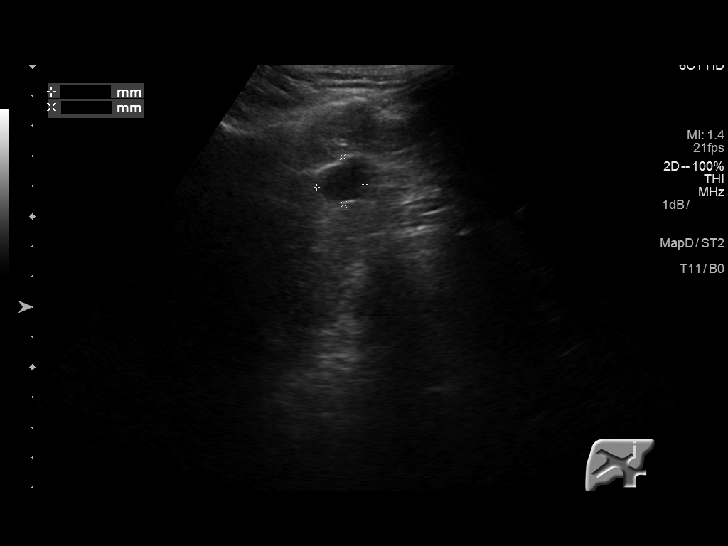
[im 25/46]
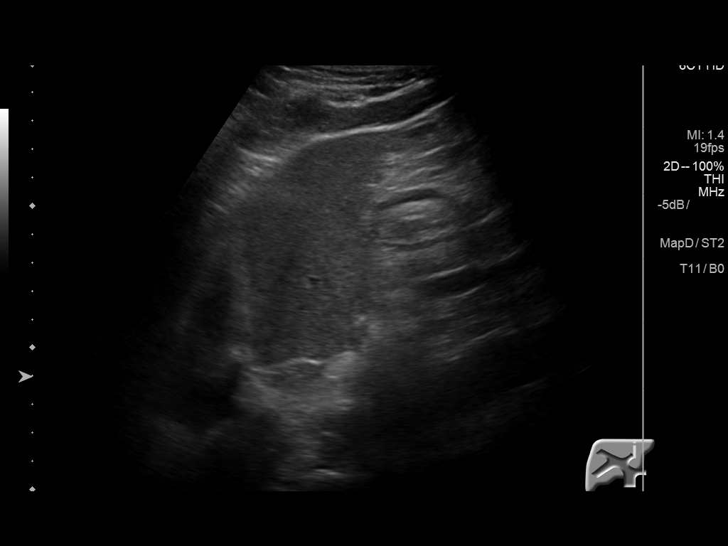
[im 29/46]
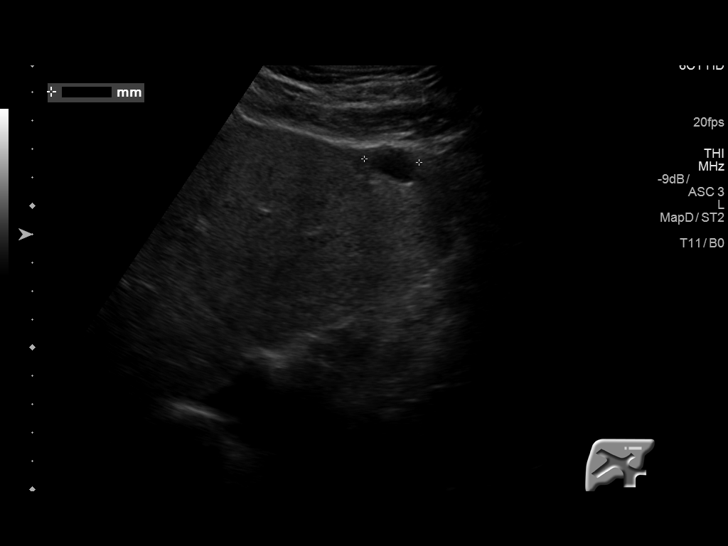
[im 31/46]
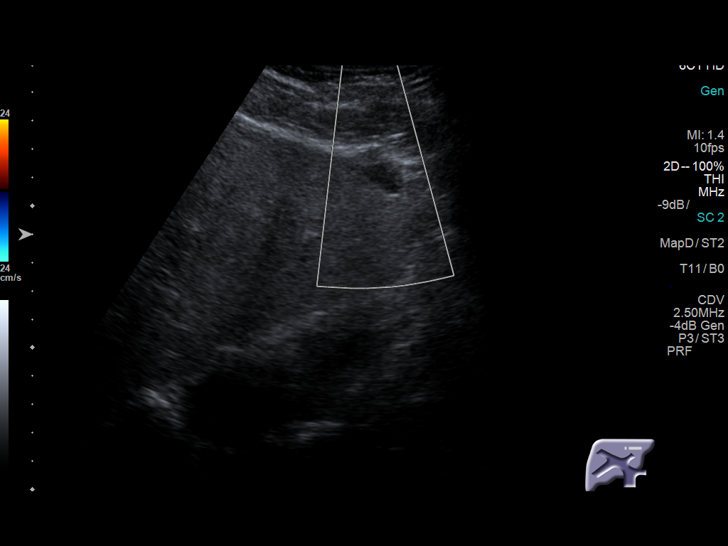
[im 34/46]
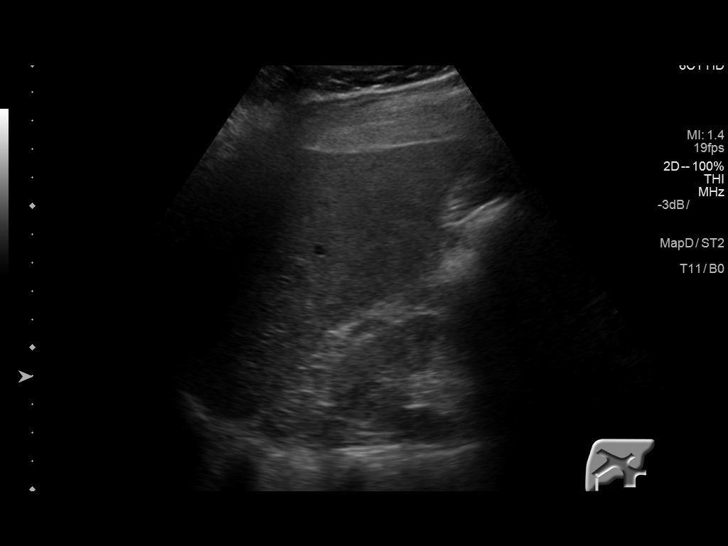
[im 38/46]
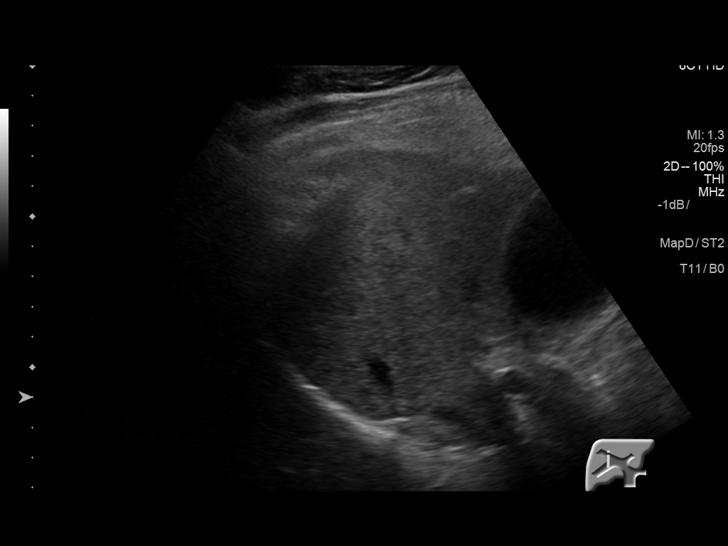
[im 42/46]
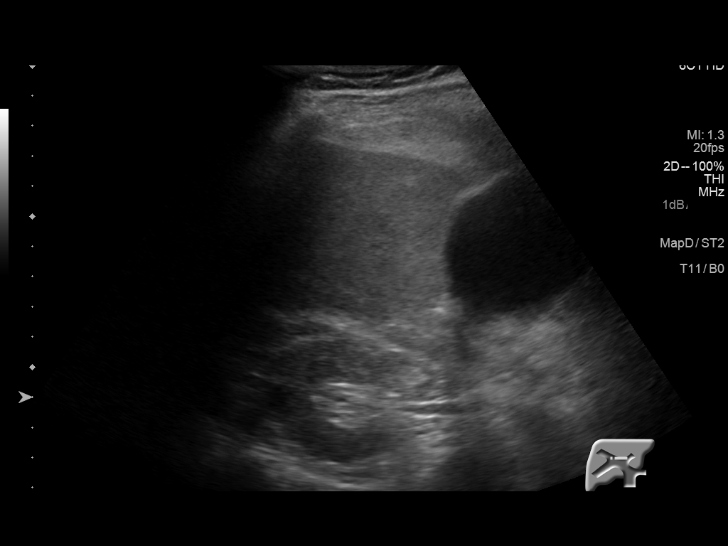
[im 46/46]
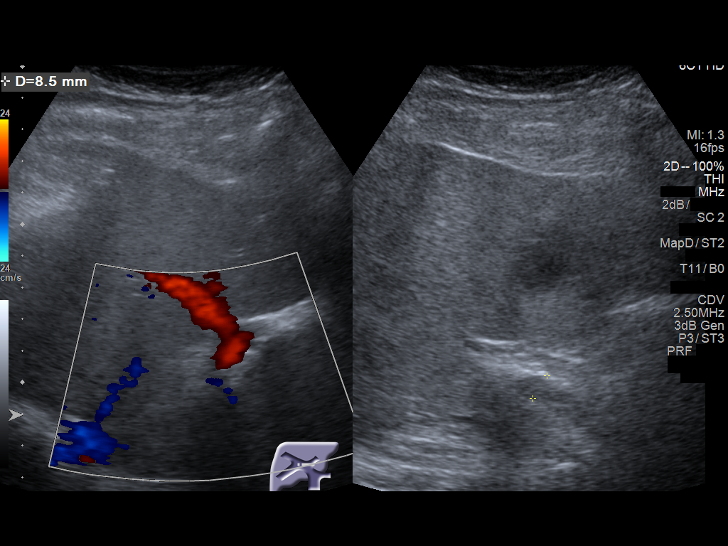

[14 of 25 positions shown; findings below may reference images not displayed]

FINDINGS: Gallbladder:

Gallbladder is mildly distended. There are two nonmobile faintly
shadowing gallstones in the gallbladder neck measuring up to 7 mm.
No gallbladder wall thickening. No pericholecystic fluid. By report,
technologist is unable to assess for sonographic Murphy sign given
history of dementia.

Common bile duct:

Diameter: 4 mm

Liver:

Simple 1.9 cm anterior left liver lobe cyst. No additional focal
liver lesions. Liver parenchymal echogenicity and echotexture are
within normal limits. Portal vein is patent on color Doppler imaging
with normal direction of blood flow towards the liver.
IMPRESSION: 1. Cholelithiasis. Mildly distended gallbladder. No gallbladder wall
thickening. No pericholecystic fluid. Unable to assess for
sonographic Murphy sign given patient's mental status. If there is
clinical concern for acute cholecystitis, consider hepatobiliary
scintigraphy study.
2. No biliary ductal dilatation.
3. Small simple left liver lobe cyst.  No suspicious liver lesions.

## 2019-12-06 IMAGING — DX DG CHEST 1V
2 series · 2 of 2 positions shown · non-contrast
Comparison: 07/24/2014

CLINICAL DATA: Confusion.  Found outside.  History of dementia.

EXAM:
CHEST  1 VIEW

[chest ap (1 of 2)]
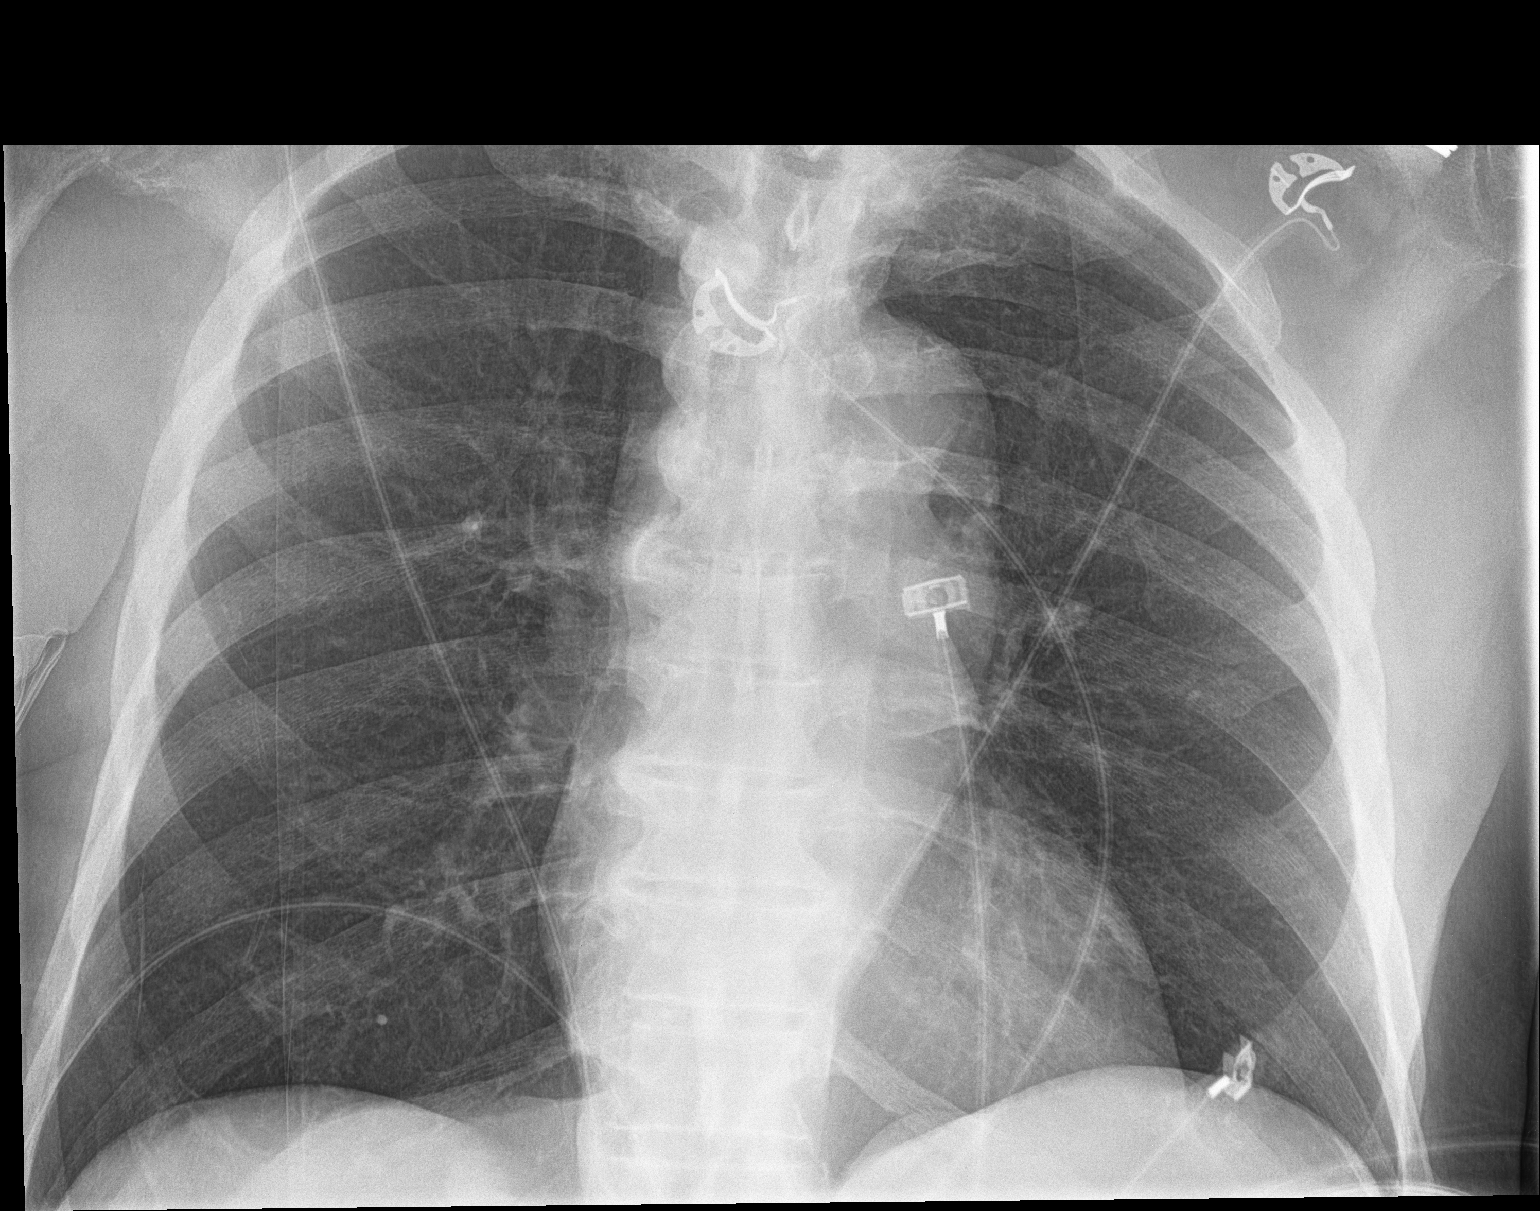

[chest ap (2 of 2)]
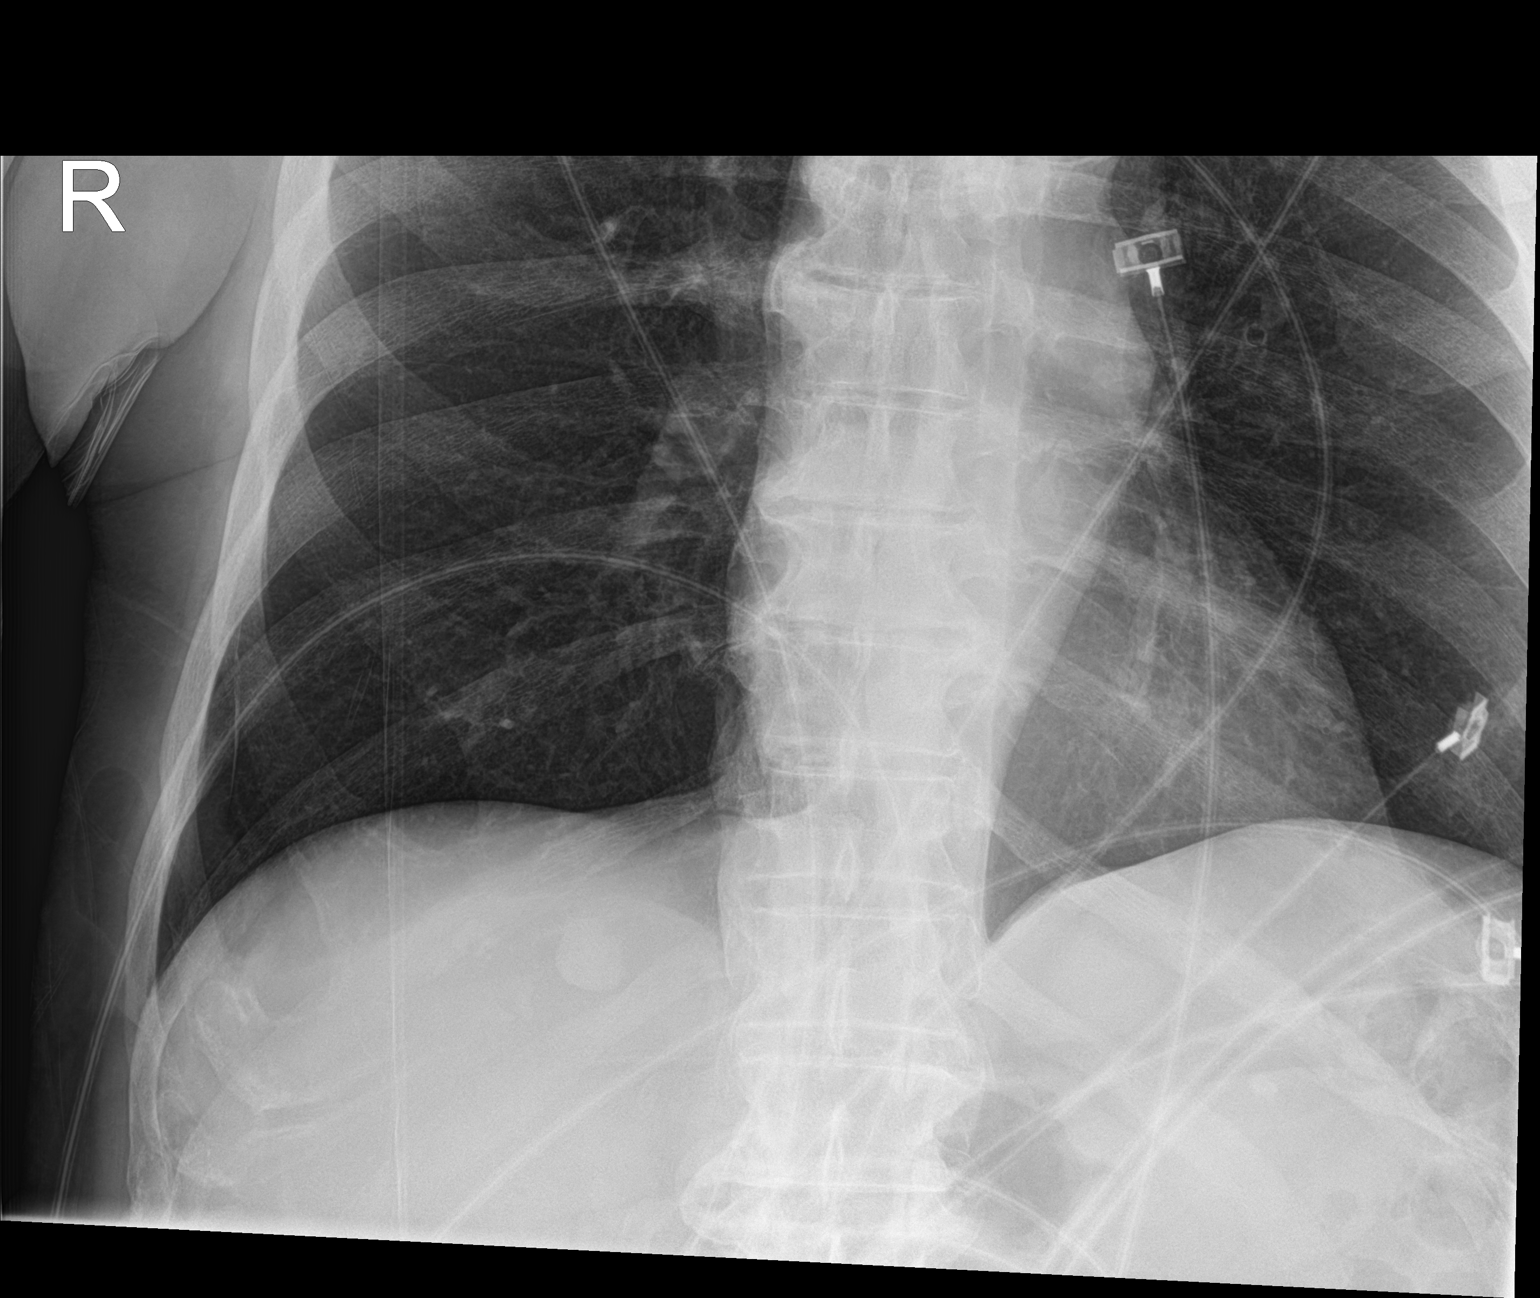

[2 of 2 positions shown; findings below may reference images not displayed]

FINDINGS: The cardiac silhouette is normal in size. The thoracic aorta is
mildly tortuous with atherosclerotic calcification noted. No
airspace consolidation, edema, pleural effusion, or pneumothorax is
identified. A 1.7 cm ovoid density projecting below the right
hemidiaphragm was not clearly present on the prior study and could
reflect a calcified granuloma in the liver or posterior costophrenic
sulcus. No acute osseous abnormality is seen.
IMPRESSION: No active disease.

## 2019-12-06 IMAGING — CT CT CERVICAL SPINE W/O CM
4 of 7 series · 14 of 33 positions shown, 15 images · non-contrast
Comparison: MRI brain 08/21/2013.  CT head 08/20/2013.

CLINICAL DATA: 89-year-old who was found outside at his house after
laying on the ground all might. Hypothermia.

EXAM:
CT HEAD WITHOUT CONTRAST
CT CERVICAL SPINE WITHOUT CONTRAST
TECHNIQUE: Multidetector CT imaging of the head and cervical spine was
performed following the standard protocol without intravenous
contrast. Multiplanar CT image reconstructions of the cervical spine
were also generated.

[Series 4: coronal soft tissue · coronal · 0.31mm/px · 2 of 76 slices shown]
[im 26/76  bone]
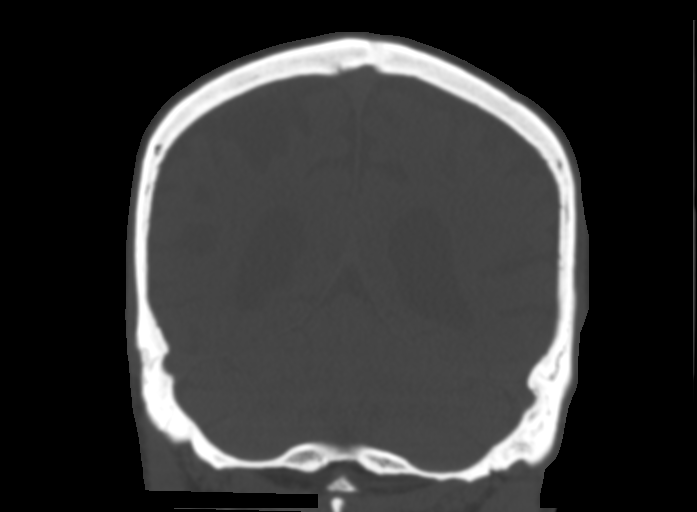
[im 51/76  bone]
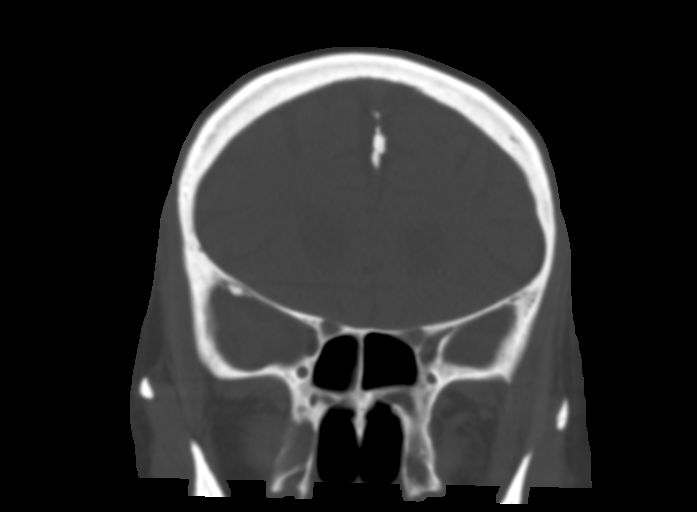

[Series 7: c spine soft · axial · 0.25mm/px · z∈[-79,+21]mm · 4 of 84 slices shown]
[im 17/84  soft-tissue]
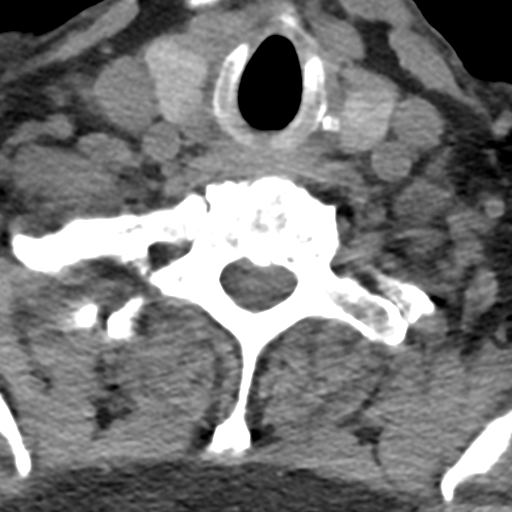
[im 34/84  soft-tissue]
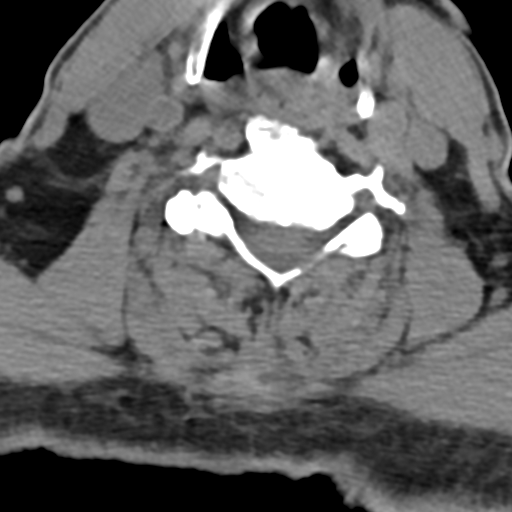
[im 50/84  soft-tissue]
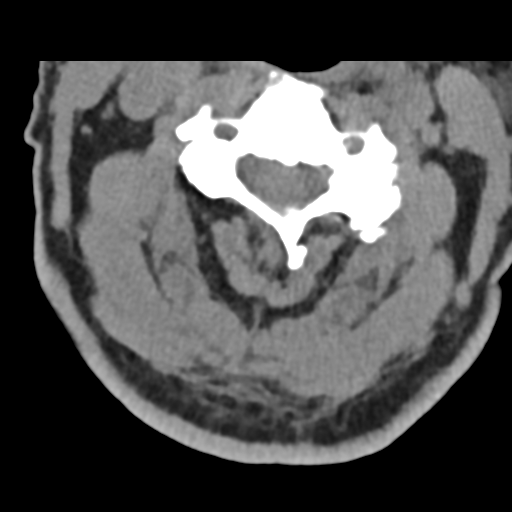
[im 67/84  soft-tissue]
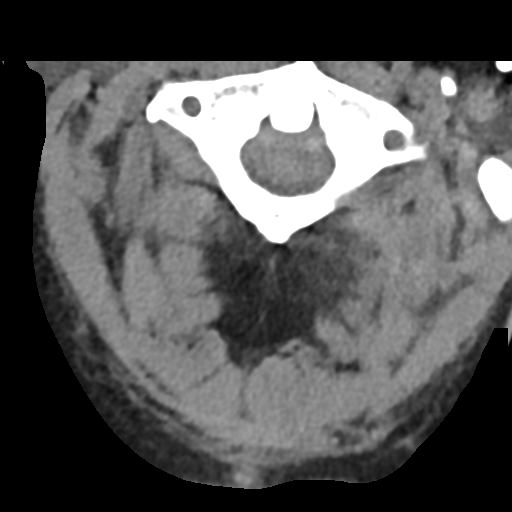

[Series 9: sagittal bone · sagittal · 0.24mm/px · 4 of 56 slices shown]
[im 12/56  bone]
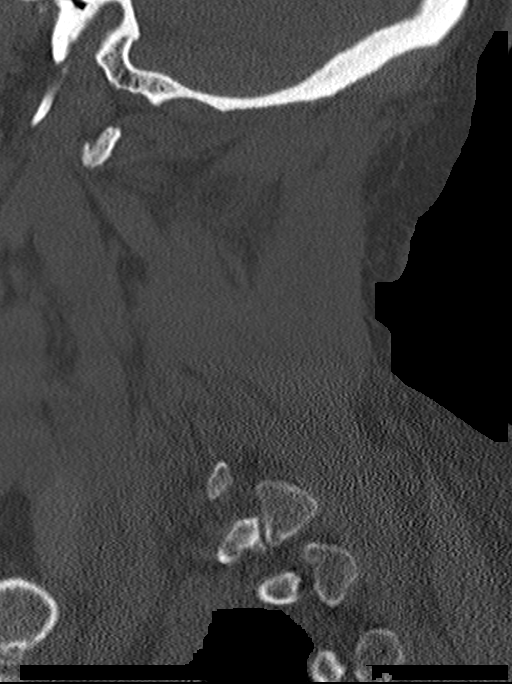
[im 23/56  bone]
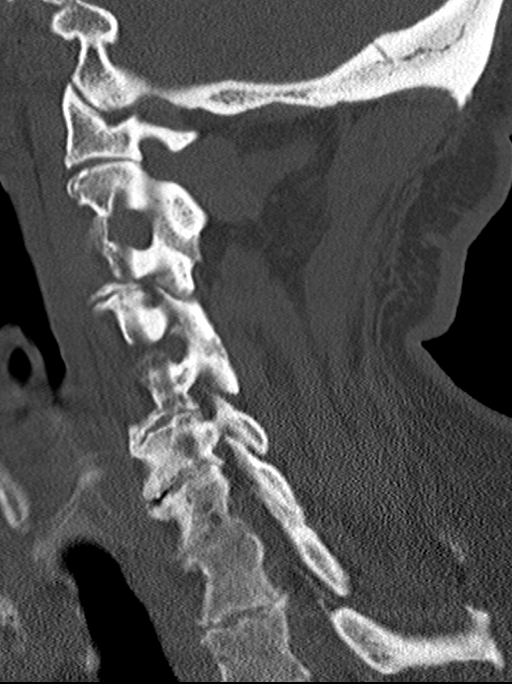
[im 34/56  bone]
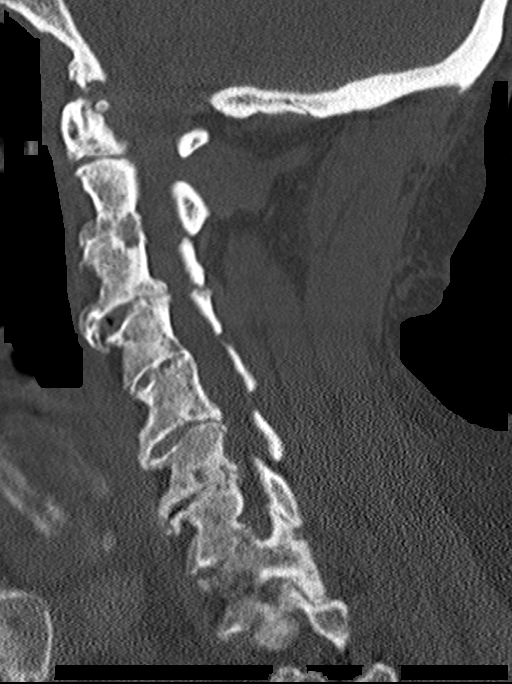
[im 45/56  bone]
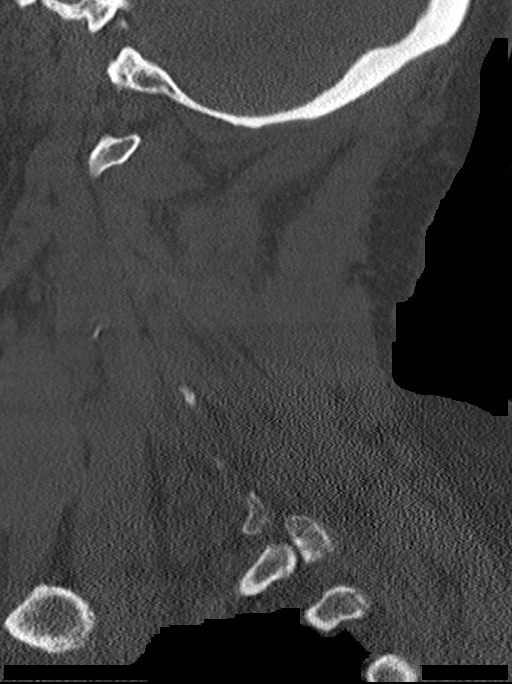

[Series 11: orthogonal bone · axial · 0.21mm/px · z∈[-79,+5]mm · 4 of 82 slices shown, 5 images]
[im 17/82  soft-tissue]
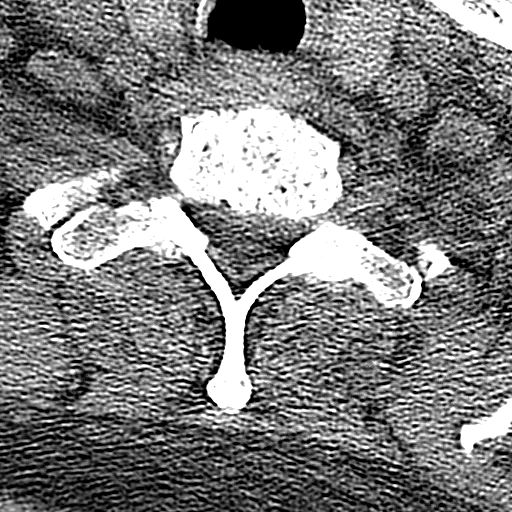
[im 17/82  bone]
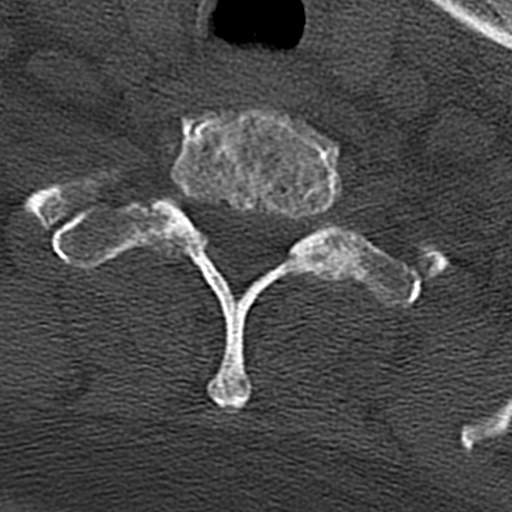
[im 33/82  bone]
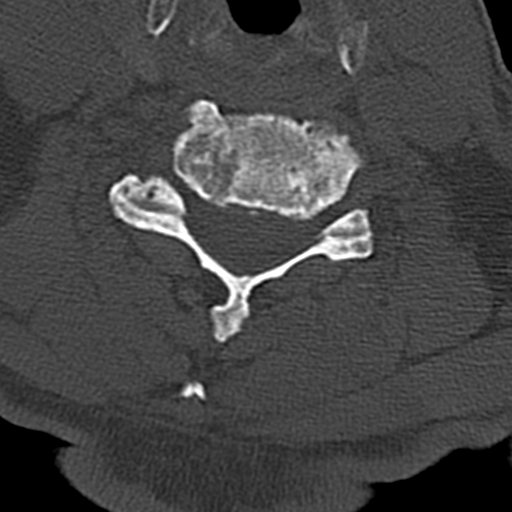
[im 49/82  bone]
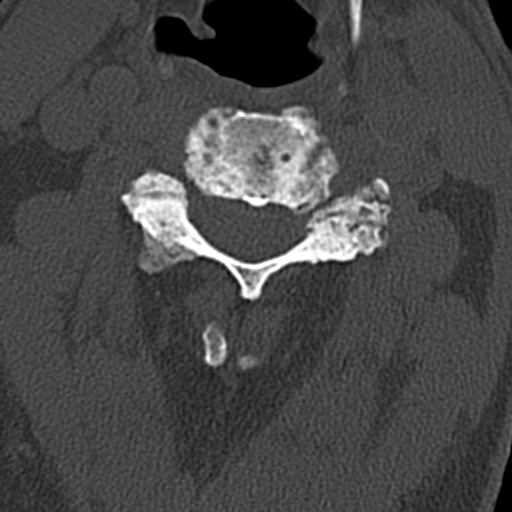
[im 65/82  bone]
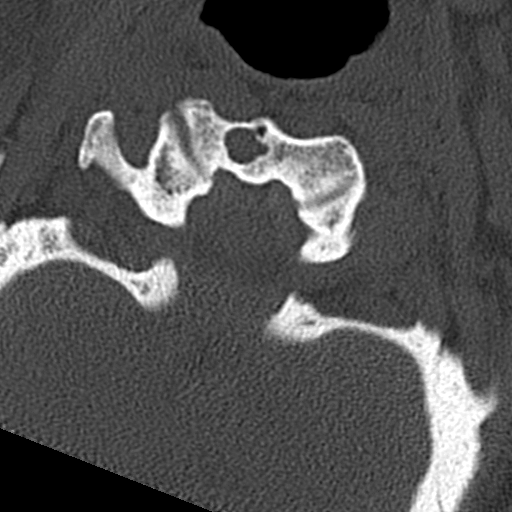

[14 of 33 positions shown; findings below may reference images not displayed]

FINDINGS: CT HEAD FINDINGS

Brain: Moderate to severe age related cortical and deep atrophy,
progressive since 9056. Moderate chronic microvascular ischemic
changes of the white matter diffusely, unchanged. No mass lesion. No
midline shift. No acute hemorrhage or hematoma. No extra-axial fluid
collections. No evidence of acute infarction. Incidental note of
partial empty sella.

Vascular: Moderate BILATERAL carotid siphon and mild BILATERAL
vertebral artery atherosclerosis. No hyperdense vessel.

Skull: No skull fracture or other focal osseous abnormality
involving the skull.

Sinuses/Orbits: Small mucous retention cyst or polyp involving the
MEDIAL wall of the LEFT maxillary sinus. Minimal mucosal thickening
involving the base of the frontal sinuses bilaterally. Remaining
visualized paranasal sinuses, BILATERAL mastoid air cells and
BILATERAL middle ear cavities well-aerated.

Visualized orbits and globes normal in appearance.

Other: None.

CT CERVICAL SPINE FINDINGS

Alignment: Anatomic POSTERIOR alignment. Straightening of the usual
lordosis.

Skull base and vertebrae: No fractures identified involving the
cervical spine. No evidence of perched facets. Diffuse facet
degenerative changes. Coronal reformatted images demonstrate an
intact craniocervical junction, intact dens and intact lateral
masses throughout. Lucent foci in the tip of the dens and in the
MEDIAL RIGHT base of the dens likely represent degenerative
cysts/geodes, as there are no lucent lesions elsewhere in the
visualized skeleton.

Soft tissues and spinal canal: No evidence of paraspinous or canal
hematoma. Moderate to severe multifactorial spinal stenosis at C3-4
and mild multifactorial spinal stenosis at C5-6 and C6-7.

Disc levels: Disc space narrowing and endplate hypertrophic changes
at every cervical level and at C7-T1. Combination of uncinate and
facet hypertrophy account for multilevel foraminal stenoses
including: Mild BILATERAL C2-3, severe BILATERAL C3-4, moderate
BILATERAL C4-5, severe BILATERAL C5-6, severe BILATERAL C6-7,
moderate LEFT C7-T1.

Upper chest: Visualized lung apices clear. Visualized superior
mediastinum normal.

Other: Mild atherosclerosis involving the LEFT carotid artery bulb.
IMPRESSION: CT Head:

1. No acute intracranial abnormality.
2. Moderate to severe generalized atrophy and chronic microvascular
ischemic changes of the white matter diffusely.

CT Cervical Spine:

1. No fractures identified involving the cervical spine.
2. Multilevel degenerative disc disease, spondylosis and facet
degenerative changes resulting in multilevel foraminal stenoses as
detailed above.
3. Moderate to severe multifactorial spinal stenosis at C3-4 and
mild multifactorial spinal stenosis at C5-6 and C6-7.

## 2020-02-14 ENCOUNTER — Emergency Department (HOSPITAL_COMMUNITY): Payer: Medicare Other

## 2020-02-14 ENCOUNTER — Encounter (HOSPITAL_COMMUNITY): Payer: Self-pay | Admitting: *Deleted

## 2020-02-14 ENCOUNTER — Emergency Department (HOSPITAL_COMMUNITY)
Admission: EM | Admit: 2020-02-14 | Discharge: 2020-02-14 | Disposition: A | Discharge status: Home / Self Care | Payer: Medicare Other | Attending: Emergency Medicine | Admitting: Emergency Medicine

## 2020-02-14 ENCOUNTER — Other Ambulatory Visit: Payer: Self-pay

## 2020-02-14 DIAGNOSIS — F0390 Unspecified dementia without behavioral disturbance: Secondary | ICD-10-CM | POA: Insufficient documentation

## 2020-02-14 DIAGNOSIS — N184 Chronic kidney disease, stage 4 (severe): Secondary | ICD-10-CM | POA: Insufficient documentation

## 2020-02-14 DIAGNOSIS — I130 Hypertensive heart and chronic kidney disease with heart failure and stage 1 through stage 4 chronic kidney disease, or unspecified chronic kidney disease: Secondary | ICD-10-CM | POA: Insufficient documentation

## 2020-02-14 DIAGNOSIS — R52 Pain, unspecified: Secondary | ICD-10-CM

## 2020-02-14 DIAGNOSIS — M79672 Pain in left foot: Secondary | ICD-10-CM | POA: Diagnosis not present

## 2020-02-14 DIAGNOSIS — E119 Type 2 diabetes mellitus without complications: Secondary | ICD-10-CM | POA: Diagnosis not present

## 2020-02-14 DIAGNOSIS — I5033 Acute on chronic diastolic (congestive) heart failure: Secondary | ICD-10-CM | POA: Insufficient documentation

## 2020-02-14 DIAGNOSIS — N3000 Acute cystitis without hematuria: Secondary | ICD-10-CM

## 2020-02-14 DIAGNOSIS — M79671 Pain in right foot: Secondary | ICD-10-CM | POA: Insufficient documentation

## 2020-02-14 DIAGNOSIS — Z79899 Other long term (current) drug therapy: Secondary | ICD-10-CM | POA: Diagnosis not present

## 2020-02-14 LAB — URINALYSIS, ROUTINE W REFLEX MICROSCOPIC
Bilirubin Urine: NEGATIVE
Glucose, UA: >=500 mg/dL — AB
Ketones, ur: NEGATIVE mg/dL
Leukocytes,Ua: NEGATIVE
Nitrite: POSITIVE — AB
Protein, ur: NEGATIVE mg/dL
Specific Gravity, Urine: 1.012 (ref 1.005–1.030)
pH: 7 (ref 5.0–8.0)

## 2020-02-14 LAB — CBC WITH DIFFERENTIAL/PLATELET
Abs Immature Granulocytes: 0.04 10*3/uL (ref 0.00–0.07)
Basophils Absolute: 0 10*3/uL (ref 0.0–0.1)
Basophils Relative: 0 %
Eosinophils Absolute: 0.1 10*3/uL (ref 0.0–0.5)
Eosinophils Relative: 1 %
HCT: 33.8 % — ABNORMAL LOW (ref 39.0–52.0)
Hemoglobin: 10.8 g/dL — ABNORMAL LOW (ref 13.0–17.0)
Immature Granulocytes: 1 %
Lymphocytes Relative: 27 %
Lymphs Abs: 1.7 10*3/uL (ref 0.7–4.0)
MCH: 27.2 pg (ref 26.0–34.0)
MCHC: 32 g/dL (ref 30.0–36.0)
MCV: 85.1 fL (ref 80.0–100.0)
Monocytes Absolute: 0.6 10*3/uL (ref 0.1–1.0)
Monocytes Relative: 9 %
Neutro Abs: 3.8 10*3/uL (ref 1.7–7.7)
Neutrophils Relative %: 62 %
Platelets: 203 10*3/uL (ref 150–400)
RBC: 3.97 MIL/uL — ABNORMAL LOW (ref 4.22–5.81)
RDW: 12.5 % (ref 11.5–15.5)
WBC: 6.2 10*3/uL (ref 4.0–10.5)
nRBC: 0 % (ref 0.0–0.2)

## 2020-02-14 LAB — BASIC METABOLIC PANEL
Anion gap: 11 (ref 5–15)
BUN: 53 mg/dL — ABNORMAL HIGH (ref 8–23)
CO2: 26 mmol/L (ref 22–32)
Calcium: 9.6 mg/dL (ref 8.9–10.3)
Chloride: 92 mmol/L — ABNORMAL LOW (ref 98–111)
Creatinine, Ser: 3.13 mg/dL — ABNORMAL HIGH (ref 0.61–1.24)
GFR calc Af Amer: 19 mL/min — ABNORMAL LOW (ref >60)
GFR calc non Af Amer: 17 mL/min — ABNORMAL LOW (ref >60)
Glucose, Bld: 548 mg/dL (ref 70–99)
Potassium: 4.6 mmol/L (ref 3.5–5.1)
Sodium: 129 mmol/L — ABNORMAL LOW (ref 135–145)

## 2020-02-14 LAB — CBG MONITORING, ED: Glucose-Capillary: 271 mg/dL — ABNORMAL HIGH (ref 70–99)

## 2020-02-14 MED ORDER — CEPHALEXIN 500 MG PO CAPS: 500.0000 mg | ORAL_CAPSULE | Freq: Four times a day (QID) | ORAL | 0 refills | Status: DC

## 2020-02-14 MED ORDER — LACTATED RINGERS IV BOLUS
500.0000 mL | Freq: Once | INTRAVENOUS | Status: AC
  Administered 2020-02-14: 500 mL via INTRAVENOUS

## 2020-02-14 MED ORDER — SODIUM CHLORIDE 0.9 % IV SOLN
1.0000 g | Freq: Once | INTRAVENOUS | Status: AC
  Administered 2020-02-14: 1 g via INTRAVENOUS
  Filled 2020-02-14: qty 10

## 2020-02-14 MED ORDER — INSULIN ASPART 100 UNIT/ML ~~LOC~~ SOLN
10.0000 [IU] | Freq: Once | SUBCUTANEOUS | Status: AC
  Administered 2020-02-14: 10 [IU] via SUBCUTANEOUS
  Filled 2020-02-14: qty 1

## 2020-02-14 NOTE — ED Triage Notes (Signed)
Pt brought in by [facility] for c/o bilateral foot pain with swelling; the pain started last night

## 2020-02-14 NOTE — ED Notes (Signed)
Date and time results received: 02/14/20 704  Test: 548 Critical Value: Glucose  Name of Provider Notified: Dr. Roderic Palau  Orders Received? Or Actions Taken?:  No new orders given.

## 2020-02-14 NOTE — ED Notes (Signed)
Patient transported to X-ray 

## 2020-02-14 NOTE — ED Provider Notes (Signed)
[facility] EMERGENCY DEPARTMENT Provider Note   CSN: [id number] Arrival date & time: 02/14/20  0431     History Chief Complaint  Patient presents with  . Foot Pain    OTHON Perez is a 86 y.o. male.  According to the assisted living facility the patient woke up in the middle night stating he had foot pain.  Then he stated he had all over pain and then he started having foot pain again.  EMS was called on arrival patient had no pain but they brought him to the emergency room.  He had Tylenol prior to arrival.  On my evaluation patient is a states he has no pain at all anywhere and is just tired but does not go to sleep because of where he lives.  On reevaluation with his daughter at the bedside initially states that he hurts everywhere but when asked specifically about body parts he states that his feet hurt but they always hurt.  The daughter does note that his right foot is more swollen than normal.  The history is provided by the EMS personnel, a caregiver and a relative.  Foot Pain       Past Medical History:  Diagnosis Date  . Dementia ([place])   . Diabetes mellitus without complication ([place])   . Encephalopathy   . Gastritis 2004  . GIB (gastrointestinal bleeding) 2004  . Hypertension   . Poor historian   . Sinus bradycardia seen on cardiac monitor     Patient Active Problem List   Diagnosis Date Noted  . CKD (chronic kidney disease) stage 4, GFR 15-29 ml/min ([facility]) 12/21/2017  . Anemia 12/21/2017  . Dementia ([place]) 09/28/2017  . Leg edema 09/28/2017  . Acute on chronic diastolic CHF (congestive heart failure) ([place]) 09/27/2017  . Acute renal failure superimposed on stage 3 chronic kidney disease ([place])   . Elevated troponin 08/25/2017  . Hyperlipidemia 08/25/2017  . Diabetes mellitus type 2, uncontrolled, with complications ([place]) 31/49/7026  . Bradycardia 08/28/2013  . PAC (premature atrial contraction) 08/28/2013  . Altered mental state 08/20/2013  . Acute  encephalopathy 08/20/2013  . Hypertension 08/20/2013  . Rhabdomyolysis 08/20/2013  . UTI (lower urinary tract infection) 08/20/2013    Past Surgical History:  Procedure Laterality Date  . INGUINAL HERNIA REPAIR  09/2010   Bilateral  . UPPER GASTROINTESTINAL ENDOSCOPY  2004       Family History  Problem Relation Age of Onset  . Diabetes Mother   . Diabetes Sister     Social History   Tobacco Use  . Smoking status: Never Smoker  . Smokeless tobacco: Never Used  Vaping Use  . Vaping Use: Never used  Substance Use Topics  . Alcohol use: No  . Drug use: No    Home Medications Prior to Admission medications   Medication Sig Start Date End Date Taking? Authorizing Provider  Balsam [place]-Castor Oil [facility]) OINT Apply liberal amount to coccyx, sacrum and bilateral buttocks q shift and prn after incontinence episodes for skin protection    [provider]  busPIRone (BUSPAR) 5 MG tablet Take 5 mg by mouth 2 (two) times daily.    [provider]  cloNIDine (CATAPRES) 0.1 MG tablet Take 0.1 mg by mouth 2 (two) times daily.    [provider]  donepezil (ARICEPT) 5 MG tablet Take 5 mg by mouth at bedtime.    [provider]  furosemide (LASIX) 40 MG tablet Take 40 mg by mouth.  [provider]  hydrALAZINE (APRESOLINE) 25 MG tablet Take 50 mg by mouth 3 (three) times daily.     [provider]  iron polysaccharides (NIFEREX) 150 MG capsule Take 150 mg by mouth daily.    [provider]  pioglitazone (ACTOS) 15 MG tablet Take 1 tablet by mouth daily. 12/01/16   [provider]  polyethylene glycol (MIRALAX / GLYCOLAX) packet Take 17 g by mouth daily as needed for mild constipation. 08/30/17   Barton Dubois, MD  temazepam (RESTORIL) 7.5 MG capsule Take 1 capsule (7.5 mg total) by mouth at bedtime. 12/11/17   Gran[place] Lewis C, PA-C  Vitamin D, Cholecalciferol, 1000 units TABS Take 1 tablet by mouth once a day     [provider]    Allergies    Patient has no known allergies.  Review of Systems   Review of Systems  Unable to perform ROS: Dementia    Physical Exam Updated Vital Signs BP (!) 141/51   Pulse (!) 59   Temp 98.2 F (36.8 C)   Resp 20   Ht 5\' 7"  (1.702 m)   Wt 87.1 kg   SpO2 95%   BMI 30.07 kg/m   Physical Exam Vitals and nursing note reviewed.  Constitutional:      Appearance: He is well-developed.  HENT:     Head: Normocephalic and atraumatic.     Mouth/Throat:     Mouth: Mucous membranes are dry.  Eyes:     Pupils: Pupils are equal, round, and reactive to light.  Cardiovascular:     Rate and Rhythm: Normal rate.  Pulmonary:     Effort: Pulmonary effort is normal. No respiratory distress.  Abdominal:     General: There is no distension.  Musculoskeletal:        General: Normal range of motion.     Cervical back: Normal range of motion.     Right lower leg: Edema present.     Left lower leg: No edema.     Comments: Musculoskeletal exam including palpation and range of motion of upper extremities lower extremities cervical/thoracic and lumbar spine all without any significant discomfort.  Skin:    General: Skin is warm and dry.     Comments: Multiple significantly long toenails without any evidence of ingrown nails or infection.  Neurological:     General: No focal deficit present.     Mental Status: He is alert.     ED Results / Procedures / Treatments   Labs (all labs ordered are listed, but only abnormal results are displayed) Labs Reviewed  CBC WITH DIFFERENTIAL/PLATELET  BASIC METABOLIC PANEL    EKG None  Radiology No results found.  Procedures Procedures (including critical care time)  Medications Ordered in ED Medications  lactated ringers bolus 500 mL (has no administration in time range)    ED Course  I have reviewed the triage vital signs and the nursing notes.  Pertinent labs & imaging results that were available  during my care of the patient were reviewed by me and considered in my medical decision making (see chart for details).    MDM Rules/Calculators/A&P                          Appears slightly dehydrated. No evident cause for his severe pain but after discussion with daughter will perform basic labs, give a small bolus of fluids and likely discharge.   Apparently when patient was rolled  in bed by nursing he was then complaining of low back pain and right hip pain. Will add on those xr's.   Care transferred to Dr. Roderic Palau to follow up XR's and labs. Likely discharge.  .  Final Clinical Impression(s) / ED Diagnoses Final diagnoses:  None    Rx / DC Orders ED Discharge Orders    None       Jentri Aye, Corene Cornea, MD 02/19/20 2340

## 2020-02-14 NOTE — Discharge Instructions (Addendum)
Follow up with your md next week for recheck. Tylenol for pain

## 2020-02-16 LAB — URINE CULTURE: Culture: >=100000 — AB

## 2020-02-17 ENCOUNTER — Other Ambulatory Visit: Payer: Self-pay

## 2020-02-17 ENCOUNTER — Encounter (HOSPITAL_COMMUNITY): Payer: Self-pay | Admitting: Emergency Medicine

## 2020-02-17 ENCOUNTER — Emergency Department (HOSPITAL_COMMUNITY)
Admission: EM | Admit: 2020-02-17 | Discharge: 2020-02-17 | Disposition: A | Discharge status: Home / Self Care | Payer: Medicare Other | Attending: Emergency Medicine | Admitting: Emergency Medicine

## 2020-02-17 DIAGNOSIS — Z79899 Other long term (current) drug therapy: Secondary | ICD-10-CM | POA: Diagnosis not present

## 2020-02-17 DIAGNOSIS — M79604 Pain in right leg: Secondary | ICD-10-CM | POA: Diagnosis not present

## 2020-02-17 DIAGNOSIS — F0390 Unspecified dementia without behavioral disturbance: Secondary | ICD-10-CM | POA: Insufficient documentation

## 2020-02-17 DIAGNOSIS — E1122 Type 2 diabetes mellitus with diabetic chronic kidney disease: Secondary | ICD-10-CM | POA: Insufficient documentation

## 2020-02-17 DIAGNOSIS — I130 Hypertensive heart and chronic kidney disease with heart failure and stage 1 through stage 4 chronic kidney disease, or unspecified chronic kidney disease: Secondary | ICD-10-CM | POA: Diagnosis not present

## 2020-02-17 DIAGNOSIS — M79605 Pain in left leg: Secondary | ICD-10-CM | POA: Diagnosis not present

## 2020-02-17 DIAGNOSIS — N184 Chronic kidney disease, stage 4 (severe): Secondary | ICD-10-CM | POA: Diagnosis not present

## 2020-02-17 DIAGNOSIS — I5033 Acute on chronic diastolic (congestive) heart failure: Secondary | ICD-10-CM | POA: Diagnosis not present

## 2020-02-17 MED ORDER — ACETAMINOPHEN 325 MG PO TABS
650.0000 mg | ORAL_TABLET | Freq: Once | ORAL | Status: AC
  Administered 2020-02-17: 650 mg via ORAL
  Filled 2020-02-17: qty 2

## 2020-02-17 NOTE — Discharge Instructions (Addendum)
Paul Perez states his legs have been painful for years because of prior injuries from playing football. Please give him acetaminophen 500 mg every 6 hours as needed for pain. His urine culture grew out E. coli that is sensitive to the cephalexin. Please make sure he finishes his course of cephalexin.

## 2020-02-17 NOTE — ED Provider Notes (Signed)
[facility] EMERGENCY DEPARTMENT Provider Note   CSN: [id number] Arrival date & time: 02/17/20  0048    Time seen 2:24 AM  History Chief Complaint  Patient presents with  . Leg Pain    bilateral   Level 5 caveat for dementia  Paul Perez is a 86 y.o. male.  HPI When I asked patient why he is here he states "I am cold". I asked him if his legs were hurting and he said no and then he said that he has "football legs". He states his legs have hurt him for years. He states he played football in high school and college and in the [employer] for 2 years.  PCP Dr Herbert Pun     Past Medical History:  Diagnosis Date  . Dementia ([place])   . Diabetes mellitus without complication ([place])   . Encephalopathy   . Gastritis 2004  . GIB (gastrointestinal bleeding) 2004  . Hypertension   . Poor historian   . Sinus bradycardia seen on cardiac monitor     Patient Active Problem List   Diagnosis Date Noted  . CKD (chronic kidney disease) stage 4, GFR 15-29 ml/min (HCC) 12/21/2017  . Anemia 12/21/2017  . Dementia ([place]) 09/28/2017  . Leg edema 09/28/2017  . Acute on chronic diastolic CHF (congestive heart failure) ([place]) 09/27/2017  . Acute renal failure superimposed on stage 3 chronic kidney disease ([place])   . Elevated troponin 08/25/2017  . Hyperlipidemia 08/25/2017  . Diabetes mellitus type 2, uncontrolled, with complications ([place]) 82/50/5397  . Bradycardia 08/28/2013  . PAC (premature atrial contraction) 08/28/2013  . Altered mental state 08/20/2013  . Acute encephalopathy 08/20/2013  . Hypertension 08/20/2013  . Rhabdomyolysis 08/20/2013  . UTI (lower urinary tract infection) 08/20/2013    Past Surgical History:  Procedure Laterality Date  . INGUINAL HERNIA REPAIR  09/2010   Bilateral  . UPPER GASTROINTESTINAL ENDOSCOPY  2004       Family History  Problem Relation Age of Onset  . Diabetes Mother   . Diabetes Sister     Social History   Tobacco Use  . Smoking status:  Never Smoker  . Smokeless tobacco: Never Used  Vaping Use  . Vaping Use: Never used  Substance Use Topics  . Alcohol use: No  . Drug use: No  Lives in a facility  Home Medications Prior to Admission medications   Medication Sig Start Date End Date Taking? Authorizing Provider  Balsam [place]-Castor Oil [facility]) OINT Apply liberal amount to coccyx, sacrum and bilateral buttocks q shift and prn after incontinence episodes for skin protection    [provider]  busPIRone (BUSPAR) 5 MG tablet Take 5 mg by mouth 2 (two) times daily.    [provider]  cephALEXin (KEFLEX) 500 MG capsule Take 1 capsule (500 mg total) by mouth 4 (four) times daily. 02/14/20   Milton Ferguson, MD  cloNIDine (CATAPRES) 0.1 MG tablet Take 0.1 mg by mouth 2 (two) times daily.    [provider]  donepezil (ARICEPT) 5 MG tablet Take 5 mg by mouth at bedtime.    [provider]  furosemide (LASIX) 40 MG tablet Take 40 mg by mouth.    [provider]  hydrALAZINE (APRESOLINE) 25 MG tablet Take 50 mg by mouth 3 (three) times daily.     [provider]  iron polysaccharides (NIFEREX) 150 MG capsule Take 150 mg by mouth daily.    [provider]  pioglitazone (ACTOS) 15 MG tablet Take  1 tablet by mouth daily. 12/01/16   [provider]  polyethylene glycol (MIRALAX / GLYCOLAX) packet Take 17 g by mouth daily as needed for mild constipation. 08/30/17   Barton Dubois, MD  temazepam (RESTORIL) 7.5 MG capsule Take 1 capsule (7.5 mg total) by mouth at bedtime. 12/11/17   Granville Lewis C, PA-C  Vitamin D, Cholecalciferol, 1000 units TABS Take 1 tablet by mouth once a day    [provider]    Allergies    Patient has no known allergies.  Review of Systems   Review of Systems  Unable to perform ROS: Dementia    Physical Exam Updated Vital Signs BP (!) 150/80   Pulse 65   Temp 98.8 F (37.1 C) (Oral)   Resp 18   Ht 6' (1.829 m)   Wt 90.7 kg    SpO2 98%   BMI 27.12 kg/m   Physical Exam Vitals and nursing note reviewed.  Constitutional:      General: He is not in acute distress.    Appearance: He is obese.  HENT:     Head: Normocephalic and atraumatic.     Right Ear: External ear normal.     Left Ear: External ear normal.  Eyes:     Extraocular Movements: Extraocular movements intact.     Conjunctiva/sclera: Conjunctivae normal.  Cardiovascular:     Rate and Rhythm: Normal rate.  Pulmonary:     Effort: Pulmonary effort is normal. No respiratory distress.  Musculoskeletal:     Cervical back: Normal range of motion.     Comments: When I examined patient's lower extremities he has normal skin warmth, there is no erythema or lesions or abrasions seen. He has good distal pulses and capillary refill. He has some swelling around the ankles of both feet. When I palpate his leg he does not appear to react as if it is painful. There is no joint effusions noted in the knees. His toes were inspected and there was no sign of infection.  Neurological:     General: No focal deficit present.     Mental Status: He is alert. Mental status is at baseline.  Psychiatric:        Mood and Affect: Mood normal.     ED Results / Procedures / Treatments   Labs (all labs ordered are listed, but only abnormal results are displayed) Labs Reviewed - No data to display  EKG None  Radiology No results found.  Procedures Procedures (including critical care time)  Medications Ordered in ED Medications  acetaminophen (TYLENOL) tablet 650 mg (has no administration in time range)    ED Course  I have reviewed the triage vital signs and the nursing notes.  Pertinent labs & imaging results that were available during my care of the patient were reviewed by me and considered in my medical decision making (see chart for details).    MDM Rules/Calculators/A&P                          Patient does not voluntarily tell me his legs are  hurting, I had to ask him if they were painful. On his exam there is no evidence of any acute problem. Patient relates to me he has had pain in his legs for years from playing football for many years. He was given Tylenol and sent back to his facility.  Patient was seen in the ED on October 1 and had x-rays of  his chest, right ankle, pelvis, lumbar spine, and thoracic spine. He had a lot of underlying arthritis but no acute findings. He had a urine culture done at that time which grew out over 100,000 colonies of E. coli that was sensitive to all antibiotics tested. It had been treated with cephalexin and Rocephin 1 g in the ED.  Final Clinical Impression(s) / ED Diagnoses Final diagnoses:  Right leg pain  Left leg pain    Rx / DC Orders ED Discharge Orders    None    OTC acetaminophen  Plan discharge  Rolland Porter, MD, Barbette Or, MD 02/17/20 [id number]

## 2020-02-17 NOTE — ED Notes (Signed)
[facility] called for transport

## 2020-02-17 NOTE — ED Notes (Signed)
Facility called

## 2020-02-17 NOTE — ED Triage Notes (Signed)
Patient brought in by EMS for bilateral leg pain from [place]. EMS reports patient hypertensive with dementia.

## 2020-02-18 ENCOUNTER — Telehealth: Payer: Self-pay | Admitting: *Deleted

## 2020-02-18 NOTE — Telephone Encounter (Signed)
Post ED Visit - Positive Culture Follow-up  Culture report reviewed by antimicrobial stewardship pharmacist: [place] Team []  Elenor Quinones, Pharm.D. []  Heide Guile, Pharm.D., BCPS AQ-ID []  Parks Neptune, Pharm.D., BCPS []  Alycia Rossetti, Pharm.D., BCPS []  Morris, Pharm.D., BCPS, AAHIVP []  Legrand Como, Pharm.D., BCPS, AAHIVP []  Salome Arnt, PharmD, BCPS []  Johnnette Gourd, PharmD, BCPS []  Hughes Better, PharmD, BCPS []  Leeroy Cha, PharmD []  Laqueta Linden, PharmD, BCPS []  Albertina Parr, PharmD  [place] Team []  Leodis Sias, PharmD []  Lindell Spar, PharmD []  Royetta Asal, PharmD []  Graylin Shiver, Rph []  Rema Fendt) Glennon Mac, PharmD []  Arlyn Dunning, PharmD []  Netta Cedars, PharmD []  Dia Sitter, PharmD []  Leone Haven, PharmD []  Gretta Arab, PharmD []  Theodis Shove, PharmD []  Peggyann Juba, PharmD []  Reuel Boom, PharmD   Positive urine culture Treated with Cephalexin, organism sensitive to the same and no further patient follow-up is required at this time. Jimmy Footman, PharmD  Harlon Flor Talley 02/18/2020, 9:48 AM

## 2020-02-22 ENCOUNTER — Inpatient Hospital Stay (HOSPITAL_COMMUNITY)
Admission: EM | Admit: 2020-02-22 | Discharge: 2020-03-06 | DRG: 871 | Disposition: A | Discharge status: Skilled Nursing Facility | Payer: Medicare Other | Attending: Family Medicine | Admitting: Family Medicine

## 2020-02-22 ENCOUNTER — Encounter (HOSPITAL_COMMUNITY): Payer: Self-pay | Admitting: Emergency Medicine

## 2020-02-22 ENCOUNTER — Emergency Department (HOSPITAL_COMMUNITY): Payer: Medicare Other

## 2020-02-22 DIAGNOSIS — Z20822 Contact with and (suspected) exposure to covid-19: Secondary | ICD-10-CM | POA: Diagnosis present

## 2020-02-22 DIAGNOSIS — E860 Dehydration: Secondary | ICD-10-CM | POA: Diagnosis present

## 2020-02-22 DIAGNOSIS — I480 Paroxysmal atrial fibrillation: Secondary | ICD-10-CM | POA: Diagnosis not present

## 2020-02-22 DIAGNOSIS — I4891 Unspecified atrial fibrillation: Secondary | ICD-10-CM | POA: Diagnosis not present

## 2020-02-22 DIAGNOSIS — A419 Sepsis, unspecified organism: Secondary | ICD-10-CM | POA: Diagnosis present

## 2020-02-22 DIAGNOSIS — N183 Chronic kidney disease, stage 3 unspecified: Secondary | ICD-10-CM | POA: Diagnosis not present

## 2020-02-22 DIAGNOSIS — F0390 Unspecified dementia without behavioral disturbance: Secondary | ICD-10-CM | POA: Diagnosis not present

## 2020-02-22 DIAGNOSIS — N179 Acute kidney failure, unspecified: Secondary | ICD-10-CM | POA: Diagnosis present

## 2020-02-22 DIAGNOSIS — Z79899 Other long term (current) drug therapy: Secondary | ICD-10-CM

## 2020-02-22 DIAGNOSIS — R140 Abdominal distension (gaseous): Secondary | ICD-10-CM

## 2020-02-22 DIAGNOSIS — Z66 Do not resuscitate: Secondary | ICD-10-CM | POA: Diagnosis not present

## 2020-02-22 DIAGNOSIS — Z8719 Personal history of other diseases of the digestive system: Secondary | ICD-10-CM

## 2020-02-22 DIAGNOSIS — R059 Cough, unspecified: Secondary | ICD-10-CM

## 2020-02-22 DIAGNOSIS — E1165 Type 2 diabetes mellitus with hyperglycemia: Secondary | ICD-10-CM | POA: Diagnosis present

## 2020-02-22 DIAGNOSIS — G9341 Metabolic encephalopathy: Secondary | ICD-10-CM | POA: Diagnosis not present

## 2020-02-22 DIAGNOSIS — F05 Delirium due to known physiological condition: Secondary | ICD-10-CM | POA: Diagnosis not present

## 2020-02-22 DIAGNOSIS — Z515 Encounter for palliative care: Secondary | ICD-10-CM | POA: Diagnosis not present

## 2020-02-22 DIAGNOSIS — M7989 Other specified soft tissue disorders: Secondary | ICD-10-CM | POA: Diagnosis not present

## 2020-02-22 DIAGNOSIS — A4152 Sepsis due to Pseudomonas: Principal | ICD-10-CM | POA: Diagnosis present

## 2020-02-22 DIAGNOSIS — I129 Hypertensive chronic kidney disease with stage 1 through stage 4 chronic kidney disease, or unspecified chronic kidney disease: Secondary | ICD-10-CM | POA: Diagnosis present

## 2020-02-22 DIAGNOSIS — E1122 Type 2 diabetes mellitus with diabetic chronic kidney disease: Secondary | ICD-10-CM | POA: Diagnosis present

## 2020-02-22 DIAGNOSIS — R7881 Bacteremia: Secondary | ICD-10-CM | POA: Diagnosis not present

## 2020-02-22 DIAGNOSIS — R6520 Severe sepsis without septic shock: Secondary | ICD-10-CM | POA: Diagnosis not present

## 2020-02-22 DIAGNOSIS — F39 Unspecified mood [affective] disorder: Secondary | ICD-10-CM | POA: Diagnosis present

## 2020-02-22 DIAGNOSIS — R1312 Dysphagia, oropharyngeal phase: Secondary | ICD-10-CM | POA: Diagnosis present

## 2020-02-22 DIAGNOSIS — Z7984 Long term (current) use of oral hypoglycemic drugs: Secondary | ICD-10-CM

## 2020-02-22 DIAGNOSIS — E871 Hypo-osmolality and hyponatremia: Secondary | ICD-10-CM | POA: Diagnosis present

## 2020-02-22 DIAGNOSIS — T17908A Unspecified foreign body in respiratory tract, part unspecified causing other injury, initial encounter: Secondary | ICD-10-CM

## 2020-02-22 DIAGNOSIS — R4182 Altered mental status, unspecified: Secondary | ICD-10-CM | POA: Diagnosis present

## 2020-02-22 DIAGNOSIS — Z7189 Other specified counseling: Secondary | ICD-10-CM | POA: Diagnosis not present

## 2020-02-22 DIAGNOSIS — L89322 Pressure ulcer of left buttock, stage 2: Secondary | ICD-10-CM | POA: Diagnosis not present

## 2020-02-22 DIAGNOSIS — E870 Hyperosmolality and hypernatremia: Secondary | ICD-10-CM | POA: Diagnosis not present

## 2020-02-22 DIAGNOSIS — K7689 Other specified diseases of liver: Secondary | ICD-10-CM | POA: Diagnosis present

## 2020-02-22 DIAGNOSIS — N184 Chronic kidney disease, stage 4 (severe): Secondary | ICD-10-CM | POA: Diagnosis present

## 2020-02-22 DIAGNOSIS — R404 Transient alteration of awareness: Secondary | ICD-10-CM | POA: Diagnosis not present

## 2020-02-22 DIAGNOSIS — E1110 Type 2 diabetes mellitus with ketoacidosis without coma: Secondary | ICD-10-CM | POA: Diagnosis present

## 2020-02-22 DIAGNOSIS — Z87440 Personal history of urinary (tract) infections: Secondary | ICD-10-CM

## 2020-02-22 DIAGNOSIS — R748 Abnormal levels of other serum enzymes: Secondary | ICD-10-CM

## 2020-02-22 DIAGNOSIS — Z23 Encounter for immunization: Secondary | ICD-10-CM | POA: Diagnosis present

## 2020-02-22 DIAGNOSIS — I10 Essential (primary) hypertension: Secondary | ICD-10-CM | POA: Diagnosis not present

## 2020-02-22 DIAGNOSIS — IMO0002 Reserved for concepts with insufficient information to code with codable children: Secondary | ICD-10-CM | POA: Diagnosis present

## 2020-02-22 DIAGNOSIS — Z833 Family history of diabetes mellitus: Secondary | ICD-10-CM

## 2020-02-22 DIAGNOSIS — K8020 Calculus of gallbladder without cholecystitis without obstruction: Secondary | ICD-10-CM | POA: Diagnosis present

## 2020-02-22 DIAGNOSIS — B965 Pseudomonas (aeruginosa) (mallei) (pseudomallei) as the cause of diseases classified elsewhere: Secondary | ICD-10-CM | POA: Diagnosis not present

## 2020-02-22 DIAGNOSIS — R6521 Severe sepsis with septic shock: Secondary | ICD-10-CM | POA: Diagnosis present

## 2020-02-22 DIAGNOSIS — R001 Bradycardia, unspecified: Secondary | ICD-10-CM | POA: Diagnosis present

## 2020-02-22 DIAGNOSIS — E118 Type 2 diabetes mellitus with unspecified complications: Secondary | ICD-10-CM | POA: Diagnosis not present

## 2020-02-22 LAB — TYPE AND SCREEN
ABO/RH(D): A POS
Antibody Screen: NEGATIVE

## 2020-02-22 LAB — CBC WITH DIFFERENTIAL/PLATELET
Abs Immature Granulocytes: 1.9 10*3/uL — ABNORMAL HIGH (ref 0.00–0.07)
Band Neutrophils: 16 %
Basophils Absolute: 0 10*3/uL (ref 0.0–0.1)
Basophils Relative: 0 %
Eosinophils Absolute: 0 10*3/uL (ref 0.0–0.5)
Eosinophils Relative: 0 %
HCT: 34.5 % — ABNORMAL LOW (ref 39.0–52.0)
Hemoglobin: 11 g/dL — ABNORMAL LOW (ref 13.0–17.0)
Lymphocytes Relative: 5 %
Lymphs Abs: 0.4 10*3/uL — ABNORMAL LOW (ref 0.7–4.0)
MCH: 27.6 pg (ref 26.0–34.0)
MCHC: 31.9 g/dL (ref 30.0–36.0)
MCV: 86.5 fL (ref 80.0–100.0)
Monocytes Absolute: 0.4 10*3/uL (ref 0.1–1.0)
Monocytes Relative: 5 %
Neutro Abs: 7.3 10*3/uL (ref 1.7–7.7)
Neutrophils Relative %: 74 %
Platelets: 203 10*3/uL (ref 150–400)
RBC: 3.99 MIL/uL — ABNORMAL LOW (ref 4.22–5.81)
RDW: 13 % (ref 11.5–15.5)
WBC: 8.1 10*3/uL (ref 4.0–10.5)
nRBC: 0 % (ref 0.0–0.2)

## 2020-02-22 LAB — BASIC METABOLIC PANEL
Anion gap: 15 (ref 5–15)
BUN: 60 mg/dL — ABNORMAL HIGH (ref 8–23)
CO2: 17 mmol/L — ABNORMAL LOW (ref 22–32)
Calcium: 8.6 mg/dL — ABNORMAL LOW (ref 8.9–10.3)
Chloride: 104 mmol/L (ref 98–111)
Creatinine, Ser: 3.57 mg/dL — ABNORMAL HIGH (ref 0.61–1.24)
GFR, Estimated: 14 mL/min — ABNORMAL LOW (ref >60)
Glucose, Bld: 332 mg/dL — ABNORMAL HIGH (ref 70–99)
Potassium: 4.2 mmol/L (ref 3.5–5.1)
Sodium: 136 mmol/L (ref 135–145)

## 2020-02-22 LAB — LACTIC ACID, PLASMA
Lactic Acid, Venous: 5.1 mmol/L (ref 0.5–1.9)
Lactic Acid, Venous: 3.5 mmol/L (ref 0.5–1.9)

## 2020-02-22 LAB — COMPREHENSIVE METABOLIC PANEL
ALT: 55 U/L — ABNORMAL HIGH (ref 0–44)
AST: 154 U/L — ABNORMAL HIGH (ref 15–41)
Albumin: 3.5 g/dL (ref 3.5–5.0)
Alkaline Phosphatase: 79 U/L (ref 38–126)
Anion gap: 17 — ABNORMAL HIGH (ref 5–15)
BUN: 62 mg/dL — ABNORMAL HIGH (ref 8–23)
CO2: 19 mmol/L — ABNORMAL LOW (ref 22–32)
Calcium: 9.1 mg/dL (ref 8.9–10.3)
Chloride: 91 mmol/L — ABNORMAL LOW (ref 98–111)
Creatinine, Ser: 3.79 mg/dL — ABNORMAL HIGH (ref 0.61–1.24)
GFR, Estimated: 13 mL/min — ABNORMAL LOW (ref >60)
Glucose, Bld: 802 mg/dL (ref 70–99)
Potassium: 5.1 mmol/L (ref 3.5–5.1)
Sodium: 127 mmol/L — ABNORMAL LOW (ref 135–145)
Total Bilirubin: 0.9 mg/dL (ref 0.3–1.2)
Total Protein: 7.1 g/dL (ref 6.5–8.1)

## 2020-02-22 LAB — URINALYSIS, ROUTINE W REFLEX MICROSCOPIC
Bacteria, UA: NONE SEEN
Bilirubin Urine: NEGATIVE
Glucose, UA: >=500 mg/dL — AB
Ketones, ur: NEGATIVE mg/dL
Leukocytes,Ua: NEGATIVE
Nitrite: NEGATIVE
Protein, ur: 30 mg/dL — AB
Specific Gravity, Urine: 1.015 (ref 1.005–1.030)
pH: 5 (ref 5.0–8.0)

## 2020-02-22 LAB — CBG MONITORING, ED
Glucose-Capillary: >600 mg/dL (ref 70–99)
Glucose-Capillary: 595 mg/dL (ref 70–99)
Glucose-Capillary: >600 mg/dL (ref 70–99)
Glucose-Capillary: 449 mg/dL — ABNORMAL HIGH (ref 70–99)
Glucose-Capillary: 530 mg/dL (ref 70–99)
Glucose-Capillary: 424 mg/dL — ABNORMAL HIGH (ref 70–99)
Glucose-Capillary: 429 mg/dL — ABNORMAL HIGH (ref 70–99)
Glucose-Capillary: 332 mg/dL — ABNORMAL HIGH (ref 70–99)
Glucose-Capillary: 246 mg/dL — ABNORMAL HIGH (ref 70–99)
Glucose-Capillary: 189 mg/dL — ABNORMAL HIGH (ref 70–99)
Glucose-Capillary: 159 mg/dL — ABNORMAL HIGH (ref 70–99)
Glucose-Capillary: 197 mg/dL — ABNORMAL HIGH (ref 70–99)
Glucose-Capillary: 145 mg/dL — ABNORMAL HIGH (ref 70–99)

## 2020-02-22 LAB — APTT: aPTT: 31 seconds (ref 24–36)

## 2020-02-22 LAB — BETA-HYDROXYBUTYRIC ACID
Beta-Hydroxybutyric Acid: 1.99 mmol/L — ABNORMAL HIGH (ref 0.05–0.27)
Beta-Hydroxybutyric Acid: 0.87 mmol/L — ABNORMAL HIGH (ref 0.05–0.27)

## 2020-02-22 LAB — MRSA PCR SCREENING: MRSA by PCR: NEGATIVE

## 2020-02-22 LAB — RESPIRATORY PANEL BY RT PCR (FLU A&B, COVID)
Influenza A by PCR: NEGATIVE
Influenza B by PCR: NEGATIVE
SARS Coronavirus 2 by RT PCR: NEGATIVE

## 2020-02-22 LAB — PROTIME-INR
INR: 1.3 — ABNORMAL HIGH (ref 0.8–1.2)
Prothrombin Time: 15.4 seconds — ABNORMAL HIGH (ref 11.4–15.2)

## 2020-02-22 MED ORDER — BUSPIRONE HCL 5 MG PO TABS
5.0000 mg | ORAL_TABLET | Freq: Two times a day (BID) | ORAL | Status: DC
  Administered 2020-02-24 – 2020-03-06 (×21): 5 mg via ORAL
  Filled 2020-02-22 (×22): qty 1

## 2020-02-22 MED ORDER — METRONIDAZOLE IN NACL 5-0.79 MG/ML-% IV SOLN
500.0000 mg | Freq: Once | INTRAVENOUS | Status: AC
  Administered 2020-02-22: 500 mg via INTRAVENOUS
  Filled 2020-02-22: qty 100

## 2020-02-22 MED ORDER — CLONIDINE HCL 0.1 MG PO TABS: 0.1000 mg | ORAL_TABLET | Freq: Two times a day (BID) | ORAL | Status: DC

## 2020-02-22 MED ORDER — FUROSEMIDE 40 MG PO TABS
40.0000 mg | ORAL_TABLET | Freq: Every day | ORAL | Status: DC
  Administered 2020-02-25 – 2020-02-29 (×5): 40 mg via ORAL
  Filled 2020-02-22 (×5): qty 1

## 2020-02-22 MED ORDER — SODIUM CHLORIDE 0.9 % IV SOLN
2.0000 g | INTRAVENOUS | Status: AC
  Administered 2020-02-23 – 2020-02-28 (×6): 2 g via INTRAVENOUS
  Filled 2020-02-22 (×6): qty 2

## 2020-02-22 MED ORDER — INSULIN REGULAR(HUMAN) IN NACL 100-0.9 UT/100ML-% IV SOLN: INTRAVENOUS | Status: DC

## 2020-02-22 MED ORDER — ACETAMINOPHEN 650 MG RE SUPP
650.0000 mg | Freq: Four times a day (QID) | RECTAL | Status: DC | PRN
  Administered 2020-02-23: 650 mg via RECTAL
  Filled 2020-02-22 (×2): qty 1

## 2020-02-22 MED ORDER — LACTATED RINGERS IV BOLUS
20.0000 mL/kg | Freq: Once | INTRAVENOUS | Status: AC
  Administered 2020-02-22: 1814 mL via INTRAVENOUS

## 2020-02-22 MED ORDER — VANCOMYCIN HCL 2000 MG/400ML IV SOLN
2000.0000 mg | Freq: Once | INTRAVENOUS | Status: AC
  Administered 2020-02-22: 2000 mg via INTRAVENOUS
  Filled 2020-02-22: qty 400

## 2020-02-22 MED ORDER — LACTATED RINGERS IV SOLN: INTRAVENOUS | Status: DC

## 2020-02-22 MED ORDER — ACETAMINOPHEN 650 MG RE SUPP
650.0000 mg | Freq: Once | RECTAL | Status: AC
  Administered 2020-02-22: 650 mg via RECTAL

## 2020-02-22 MED ORDER — SODIUM CHLORIDE 0.9 % IV SOLN
2.0000 g | Freq: Once | INTRAVENOUS | Status: AC
  Administered 2020-02-22: 2 g via INTRAVENOUS
  Filled 2020-02-22: qty 2

## 2020-02-22 MED ORDER — POTASSIUM CHLORIDE 10 MEQ/100ML IV SOLN
10.0000 meq | INTRAVENOUS | Status: AC
  Administered 2020-02-22 (×2): 10 meq via INTRAVENOUS
  Filled 2020-02-22 (×2): qty 100

## 2020-02-22 MED ORDER — VANCOMYCIN HCL IN DEXTROSE 1-5 GM/200ML-% IV SOLN: 1000.0000 mg | Freq: Once | INTRAVENOUS | Status: DC

## 2020-02-22 MED ORDER — HYDRALAZINE HCL 25 MG PO TABS
50.0000 mg | ORAL_TABLET | Freq: Three times a day (TID) | ORAL | Status: DC
  Administered 2020-02-24 – 2020-02-25 (×5): 50 mg via ORAL
  Filled 2020-02-22 (×5): qty 2

## 2020-02-22 MED ORDER — ONDANSETRON HCL 4 MG PO TABS: 4.0000 mg | ORAL_TABLET | Freq: Four times a day (QID) | ORAL | Status: DC | PRN

## 2020-02-22 MED ORDER — ACETAMINOPHEN 325 MG PO TABS: 650.0000 mg | ORAL_TABLET | Freq: Four times a day (QID) | ORAL | Status: DC | PRN

## 2020-02-22 MED ORDER — INSULIN REGULAR(HUMAN) IN NACL 100-0.9 UT/100ML-% IV SOLN
INTRAVENOUS | Status: DC
  Administered 2020-02-22: 11.5 [IU]/h via INTRAVENOUS
  Filled 2020-02-22: qty 100

## 2020-02-22 MED ORDER — SENNA 8.6 MG PO TABS
1.0000 | ORAL_TABLET | Freq: Two times a day (BID) | ORAL | Status: DC
  Administered 2020-02-24 – 2020-03-06 (×21): 8.6 mg via ORAL
  Filled 2020-02-22 (×24): qty 1

## 2020-02-22 MED ORDER — ONDANSETRON HCL 4 MG/2ML IJ SOLN: 4.0000 mg | Freq: Four times a day (QID) | INTRAMUSCULAR | Status: DC | PRN

## 2020-02-22 MED ORDER — DEXTROSE IN LACTATED RINGERS 5 % IV SOLN: INTRAVENOUS | Status: DC

## 2020-02-22 MED ORDER — SODIUM CHLORIDE 0.9 % IV BOLUS
3000.0000 mL | Freq: Once | INTRAVENOUS | Status: AC
  Administered 2020-02-22: 3000 mL via INTRAVENOUS

## 2020-02-22 MED ORDER — DEXTROSE 50 % IV SOLN: 0.0000 mL | INTRAVENOUS | Status: DC | PRN

## 2020-02-22 MED ORDER — DONEPEZIL HCL 5 MG PO TABS
5.0000 mg | ORAL_TABLET | Freq: Every day | ORAL | Status: DC
  Administered 2020-02-24 – 2020-03-05 (×11): 5 mg via ORAL
  Filled 2020-02-22 (×12): qty 1

## 2020-02-22 MED ORDER — DOCUSATE SODIUM 100 MG PO CAPS
100.0000 mg | ORAL_CAPSULE | Freq: Two times a day (BID) | ORAL | Status: DC
  Administered 2020-02-24 – 2020-03-06 (×21): 100 mg via ORAL
  Filled 2020-02-22 (×22): qty 1

## 2020-02-22 MED ORDER — PANTOPRAZOLE SODIUM 40 MG IV SOLR
40.0000 mg | Freq: Once | INTRAVENOUS | Status: AC
  Administered 2020-02-22: 40 mg via INTRAVENOUS
  Filled 2020-02-22: qty 40

## 2020-02-22 MED ORDER — VANCOMYCIN HCL IN DEXTROSE 1-5 GM/200ML-% IV SOLN: 1000.0000 mg | INTRAVENOUS | Status: DC

## 2020-02-22 MED ORDER — METRONIDAZOLE IN NACL 5-0.79 MG/ML-% IV SOLN
500.0000 mg | Freq: Three times a day (TID) | INTRAVENOUS | Status: DC
  Administered 2020-02-22 – 2020-02-24 (×5): 500 mg via INTRAVENOUS
  Filled 2020-02-22 (×5): qty 100

## 2020-02-22 MED ORDER — LACTATED RINGERS IV BOLUS: 20.0000 mL/kg | Freq: Once | INTRAVENOUS | Status: DC

## 2020-02-22 MED ORDER — ENOXAPARIN SODIUM 40 MG/0.4ML ~~LOC~~ SOLN
40.0000 mg | SUBCUTANEOUS | Status: DC
  Administered 2020-02-22: 40 mg via SUBCUTANEOUS
  Filled 2020-02-22: qty 0.4

## 2020-02-22 NOTE — H&P (Addendum)
History and Physical    SHEENA SIMONIS VOJ:[id number] DOB: 1927-06-09 DOA: 02/22/2020  PCP: Alanson Puls [facility]   Patient coming from:  Assisted Living facility  I have personally briefly reviewed patient's old medical records in [facility].  Chief Complaint: Generalized weakness,  Confusion and fever.  HPI: LARRI YEHLE is a 86 y.o. male with medical history significant of Dementia, Diabetes Mellitus II,  Hypertension, CKD stage IV, premature atrial contractions , history of GI bleed, presents in the emergency department with confusion and fever. History is obtained from ED chart. Patient is demented,  unable to provide history.  History is also obtained from daughter [name] over the phone.  This is patient's third ER visit  In last three weeks.  Last week he presented in the ED with generalized weakness and pain,  found to have a UTI . He was discharged on Keflex.  Daughter reports he has been having worsening pain and weakness, was not able to stand up in the morning.  He was brought in the emergency department the morning.  ED Course: He was febrile with a temp of 101.8, tachycardic, tachypneic and hypertensive. O2 saturation 100% on room air. Labs: CBC: WBC 8.1, hemoglobin 11.0, hematocrit 34.5, platelet 203, INR 1.3, sodium 127, potassium 5.1, chloride 91, bicarb 19, BUN 62, creatinine 3.79, calcium 9.1, glucose 802 anion gap 17 AST 154 ALT 55 lactic acid 5.1. CXR: No acute abnormality.   Review of Systems: As per HPI otherwise 10 point review of systems negative.  Review of Systems  Constitutional: Positive for chills and fever.  HENT: Negative.   Eyes: Negative.   Respiratory: Positive for shortness of breath.   Cardiovascular: Negative.   Gastrointestinal: Negative.   Genitourinary: Negative.   Musculoskeletal: Positive for joint pain.  Skin: Negative.   Neurological: Positive for weakness.  Psychiatric/Behavioral: Negative.      Past Medical History:    Diagnosis Date  . Dementia ([place])   . Diabetes mellitus without complication ([place])   . Encephalopathy   . Gastritis 2004  . GIB (gastrointestinal bleeding) 2004  . Hypertension   . Poor historian   . Sinus bradycardia seen on cardiac monitor     Past Surgical History:  Procedure Laterality Date  . INGUINAL HERNIA REPAIR  09/2010   Bilateral  . UPPER GASTROINTESTINAL ENDOSCOPY  2004     reports that he has never smoked. He has never used smokeless tobacco. He reports that he does not drink alcohol and does not use drugs.  No Known Allergies  Family History  Problem Relation Age of Onset  . Diabetes Mother   . Diabetes Sister     Family history reviewed and not pertinent.  Prior to Admission medications   Medication Sig Start Date End Date Taking? Authorizing Provider  Balsam [place]-Castor Oil [facility]) OINT Apply liberal amount to coccyx, sacrum and bilateral buttocks q shift and prn after incontinence episodes for skin protection    [provider]  busPIRone (BUSPAR) 5 MG tablet Take 5 mg by mouth 2 (two) times daily.    [provider]  cephALEXin (KEFLEX) 500 MG capsule Take 1 capsule (500 mg total) by mouth 4 (four) times daily. 02/14/20   Milton Ferguson, MD  cloNIDine (CATAPRES) 0.1 MG tablet Take 0.1 mg by mouth 2 (two) times daily.    [provider]  donepezil (ARICEPT) 5 MG tablet Take 5 mg by mouth at bedtime.    [provider]  furosemide (  LASIX) 40 MG tablet Take 40 mg by mouth.    [provider]  hydrALAZINE (APRESOLINE) 25 MG tablet Take 50 mg by mouth 3 (three) times daily.     [provider]  iron polysaccharides (NIFEREX) 150 MG capsule Take 150 mg by mouth daily.    [provider]  pioglitazone (ACTOS) 15 MG tablet Take 1 tablet by mouth daily. 12/01/16   [provider]  polyethylene glycol (MIRALAX / GLYCOLAX) packet Take 17 g by mouth daily as needed for mild constipation. 08/30/17    Barton Dubois, MD  temazepam (RESTORIL) 7.5 MG capsule Take 1 capsule (7.5 mg total) by mouth at bedtime. 12/11/17   Granville Lewis C, PA-C  Vitamin D, Cholecalciferol, 1000 units TABS Take 1 tablet by mouth once a day    [provider]    Physical Exam: Vitals:   02/22/20 1400 02/22/20 1415 02/22/20 1430 02/22/20 1445  BP: (!) 138/53  138/69   Pulse: 80 78 78 78  Resp: 17 18 16 18   Temp: (!) 101.1 F (38.4 C) (!) 100.9 F (38.3 C) (!) 100.9 F (38.3 C) (!) 100.9 F (38.3 C)  TempSrc:      SpO2: 100% 100% 100% 100%  Weight:      Height:        Constitutional: NAD, calm, comfortable Vitals:   02/22/20 1400 02/22/20 1415 02/22/20 1430 02/22/20 1445  BP: (!) 138/53  138/69   Pulse: 80 78 78 78  Resp: 17 18 16 18   Temp: (!) 101.1 F (38.4 C) (!) 100.9 F (38.3 C) (!) 100.9 F (38.3 C) (!) 100.9 F (38.3 C)  TempSrc:      SpO2: 100% 100% 100% 100%  Weight:      Height:       Eyes: PERRL, lids and conjunctivae normal ENMT: Mucous membranes are moist. Posterior pharynx clear of any exudate or lesions.Normal dentition.  Neck: normal, supple, no masses, no thyromegaly. No neck stiffness Respiratory: clear to auscultation bilaterally, no wheezing, no crackles. Normal respiratory effort. No accessory muscle use.  Cardiovascular: Regular rate and rhythm, no murmurs / rubs / gallops. No extremity edema. 2+ pedal pulses. No carotid bruits.  Abdomen: RUQ tenderness, no masses palpated. No hepatosplenomegaly. Bowel sounds positive.  Musculoskeletal: no clubbing / cyanosis. No joint deformity upper and lower extremities. Good ROM, no contractures. Normal muscle tone.  Skin: no rashes, lesions, ulcers. No induration Neurologic: CN 2-12 grossly intact. Sensation intact, DTR normal.  Psychiatric: Normal judgment and insight. Alert and oriented x 1. Normal mood.   Labs on Admission: I have personally reviewed following labs and imaging studies  CBC: Recent Labs  Lab  02/22/20 1220  WBC 8.1  NEUTROABS 7.3  HGB 11.0*  HCT 34.5*  MCV 86.5  PLT 371   Basic Metabolic Panel: Recent Labs  Lab 02/22/20 1220  NA 127*  K 5.1  CL 91*  CO2 19*  GLUCOSE 802*  BUN 62*  CREATININE 3.79*  CALCIUM 9.1   GFR: Estimated Creatinine Clearance: 14.2 mL/min (A) (by C-G formula based on SCr of 3.79 mg/dL (H)). Liver Function Tests: Recent Labs  Lab 02/22/20 1220  AST 154*  ALT 55*  ALKPHOS 79  BILITOT 0.9  PROT 7.1  ALBUMIN 3.5   No results for input(s): LIPASE, AMYLASE in the last 168 hours. No results for input(s): AMMONIA in the last 168 hours. Coagulation Profile: Recent Labs  Lab 02/22/20 1220  INR 1.3*   Cardiac Enzymes: No  results for input(s): CKTOTAL, CKMB, CKMBINDEX, TROPONINI in the last 168 hours. BNP (last 3 results) No results for input(s): PROBNP in the last 8760 hours. HbA1C: No results for input(s): HGBA1C in the last 72 hours. CBG: Recent Labs  Lab 02/22/20 1150 02/22/20 1431 02/22/20 1509  GLUCAP >600* >600* 595*   Lipid Profile: No results for input(s): CHOL, HDL, LDLCALC, TRIG, CHOLHDL, LDLDIRECT in the last 72 hours. Thyroid Function Tests: No results for input(s): TSH, T4TOTAL, FREET4, T3FREE, THYROIDAB in the last 72 hours. Anemia Panel: No results for input(s): VITAMINB12, FOLATE, FERRITIN, TIBC, IRON, RETICCTPCT in the last 72 hours. Urine analysis:    Component Value Date/Time   COLORURINE YELLOW 02/22/2020 1205   APPEARANCEUR HAZY (A) 02/22/2020 1205   LABSPEC 1.015 02/22/2020 1205   PHURINE 5.0 02/22/2020 1205   GLUCOSEU >=500 (A) 02/22/2020 1205   HGBUR LARGE (A) 02/22/2020 1205   [place] 02/22/2020 1205   [place] 02/22/2020 1205   PROTEINUR 30 (A) 02/22/2020 1205   UROBILINOGEN 0.2 08/20/2013 1820   NITRITE NEGATIVE 02/22/2020 1205   LEUKOCYTESUR NEGATIVE 02/22/2020 1205    Radiological Exams on Admission: DG Chest Port 1 View  Result Date: 02/22/2020 CLINICAL DATA:   Sepsis EXAM: PORTABLE CHEST 1 VIEW COMPARISON:  February 14, 2020 FINDINGS: The heart size and mediastinal contours are within normal limits. Both lungs are clear. The visualized skeletal structures are unremarkable. IMPRESSION: No active disease. Electronically Signed   By: Abelardo Diesel M.D.   On: 02/22/2020 14:08    EKG: Independently reviewed.  Sinus rhythm, biatrial enlargement, LVH.  Assessment/Plan Active Problems:   Severe sepsis with acute organ dysfunction (HCC)  Severe sepsis secondary to unknown cause: Patient presents with fever, tachycardia, tachypnea, lactic acid 5.3. He was treated for UTI with Keflex recently, continues to have fever. Admit to stepdown, patient received IV fluids in the ED. Post fluid resuscitation lactic acid 3.2.  we will continue IV fluids. Patient received vancomycin, cefepime and Flagyl in the ED.  Pharmacy consulted for dosing , will continue the same. Follow up blood cultures / urine cultures and de-escalate antibiotics accordingly. Chest x-ray unremarkable, UA shows few WBCs otherwise nitrites, LE :negative  High anion gap metabolic acidosis secondary to DKA: Patient found to have a blood sugar of 802, high lactic acid,  high anion gap. Elevated Hydroxy butyrate 1.91. Start DKA protocol,  Continue insulin drip as per protocol, Transition to subcu insulin once blood glucose below 250. Continue IV fluids,  recheck BMP every 4 hours. Replace potassium as per protocol.  Hyponatremia: This could be due to dehydration we will continue IV fluids. Recheck sodium tomorrow.  Elevated liver enzymes: He is found to have elevated liver enzymes.   Noted to have some right lower quadrant tenderness. Obtain right upper quadrant ultrasound.  AKI on CKD stage III: Baseline serum creatinine remains 2.4-2.6 Avoid nephrotoxic medication Nephrology consulted will follow up recommendation.  Hypertension:  Resume home blood pressure medication.  Dementia:  Continue  Aricept.   DVT prophylaxis: Lovenox Code Status: Full Family Communication:  No one at bed side. Disposition Plan:  Status is: Inpatient  Remains inpatient appropriate because:Inpatient level of care appropriate due to severity of illness   Dispo: The patient is from: Home              Anticipated d/c is to: SNF              Anticipated d/c date is: 3 days  Patient currently is not medically stable to d/c.   Consults called: Nephrology Jonnie Finner). Admission status: inpatient    Shawna Clamp MD [facility]   If 7PM-7AM, please contact night-coverage [email or web address]   02/22/2020, 3:14 PM

## 2020-02-22 NOTE — ED Provider Notes (Signed)
[facility] EMERGENCY DEPARTMENT Provider Note   CSN: [id number] Arrival date & time: 02/22/20  1134     History Chief Complaint  Patient presents with  . Altered Mental Status    Paul Perez is a 86 y.o. male.  Patient brought in here with fever and confusion.  Patient has a history of dementia diabetes hypertension and has had uroseptic before  The history is provided by medical records and the patient. No language interpreter was used.  Weakness Severity:  Moderate Onset quality:  Sudden Timing:  Constant Progression:  Worsening Chronicity:  New Context: not alcohol use   Relieved by:  Nothing Ineffective treatments:  None tried Associated symptoms: no abdominal pain        Past Medical History:  Diagnosis Date  . Dementia ([place])   . Diabetes mellitus without complication ([place])   . Encephalopathy   . Gastritis 2004  . GIB (gastrointestinal bleeding) 2004  . Hypertension   . Poor historian   . Sinus bradycardia seen on cardiac monitor     Patient Active Problem List   Diagnosis Date Noted  . CKD (chronic kidney disease) stage 4, GFR 15-29 ml/min (HCC) 12/21/2017  . Anemia 12/21/2017  . Dementia ([place]) 09/28/2017  . Leg edema 09/28/2017  . Acute on chronic diastolic CHF (congestive heart failure) ([place]) 09/27/2017  . Acute renal failure superimposed on stage 3 chronic kidney disease ([place])   . Elevated troponin 08/25/2017  . Hyperlipidemia 08/25/2017  . Diabetes mellitus type 2, uncontrolled, with complications ([place]) 63/78/5885  . Bradycardia 08/28/2013  . PAC (premature atrial contraction) 08/28/2013  . Altered mental state 08/20/2013  . Acute encephalopathy 08/20/2013  . Hypertension 08/20/2013  . Rhabdomyolysis 08/20/2013  . UTI (lower urinary tract infection) 08/20/2013    Past Surgical History:  Procedure Laterality Date  . INGUINAL HERNIA REPAIR  09/2010   Bilateral  . UPPER GASTROINTESTINAL ENDOSCOPY  2004       Family History    Problem Relation Age of Onset  . Diabetes Mother   . Diabetes Sister     Social History   Tobacco Use  . Smoking status: Never Smoker  . Smokeless tobacco: Never Used  Vaping Use  . Vaping Use: Never used  Substance Use Topics  . Alcohol use: No  . Drug use: No    Home Medications Prior to Admission medications   Medication Sig Start Date End Date Taking? Authorizing Provider  Balsam [place]-Castor Oil [facility]) OINT Apply liberal amount to coccyx, sacrum and bilateral buttocks q shift and prn after incontinence episodes for skin protection    [provider]  busPIRone (BUSPAR) 5 MG tablet Take 5 mg by mouth 2 (two) times daily.    [provider]  cephALEXin (KEFLEX) 500 MG capsule Take 1 capsule (500 mg total) by mouth 4 (four) times daily. 02/14/20   Milton Ferguson, MD  cloNIDine (CATAPRES) 0.1 MG tablet Take 0.1 mg by mouth 2 (two) times daily.    [provider]  donepezil (ARICEPT) 5 MG tablet Take 5 mg by mouth at bedtime.    [provider]  furosemide (LASIX) 40 MG tablet Take 40 mg by mouth.    [provider]  hydrALAZINE (APRESOLINE) 25 MG tablet Take 50 mg by mouth 3 (three) times daily.     [provider]  iron polysaccharides (NIFEREX) 150 MG capsule Take 150 mg by mouth daily.    [provider]  pioglitazone (ACTOS) 15 MG tablet Take  1 tablet by mouth daily. 12/01/16   [provider]  polyethylene glycol (MIRALAX / GLYCOLAX) packet Take 17 g by mouth daily as needed for mild constipation. 08/30/17   Barton Dubois, MD  temazepam (RESTORIL) 7.5 MG capsule Take 1 capsule (7.5 mg total) by mouth at bedtime. 12/11/17   Granville Lewis C, PA-C  Vitamin D, Cholecalciferol, 1000 units TABS Take 1 tablet by mouth once a day    [provider]    Allergies    Patient has no known allergies.  Review of Systems   Review of Systems  Unable to perform ROS: Mental status change  Gastrointestinal:  Negative for abdominal pain.  Neurological: Positive for weakness.    Physical Exam Updated Vital Signs BP (!) 173/104 (BP Location: Right Arm)   Pulse 91   Temp (!) 101.8 F (38.8 C) (Rectal)   Resp 20   Ht 6' (1.829 m)   Wt 90.7 kg   SpO2 100%   BMI 27.12 kg/m   Physical Exam Vitals and nursing note reviewed.  Constitutional:      Appearance: He is well-developed.  HENT:     Head: Normocephalic.     Nose:     Comments: Dry mucous membrane Eyes:     General: No scleral icterus.    Conjunctiva/sclera: Conjunctivae normal.  Neck:     Thyroid: No thyromegaly.  Cardiovascular:     Rate and Rhythm: Normal rate and regular rhythm.     Heart sounds: No murmur heard.  No friction rub. No gallop.   Pulmonary:     Breath sounds: No stridor. No wheezing or rales.  Chest:     Chest wall: No tenderness.  Abdominal:     General: There is no distension.     Tenderness: There is no abdominal tenderness. There is no rebound.  Musculoskeletal:        General: Normal range of motion.     Cervical back: Neck supple.  Lymphadenopathy:     Cervical: No cervical adenopathy.  Skin:    Findings: No erythema or rash.  Neurological:     Mental Status: He is alert.     Motor: No abnormal muscle tone.     Coordination: Coordination normal.     Comments: Patient oriented to person only  Psychiatric:        Behavior: Behavior normal.     ED Results / Procedures / Treatments   Labs (all labs ordered are listed, but only abnormal results are displayed) Labs Reviewed  LACTIC ACID, PLASMA - Abnormal; Notable for the following components:      Result Value   Lactic Acid, Venous 5.1 (*)    All other components within normal limits  COMPREHENSIVE METABOLIC PANEL - Abnormal; Notable for the following components:   Sodium 127 (*)    Chloride 91 (*)    CO2 19 (*)    Glucose, Bld 802 (*)    BUN 62 (*)    Creatinine, Ser 3.79 (*)    AST 154 (*)    ALT 55 (*)    GFR, Estimated 13 (*)     Anion gap 17 (*)    All other components within normal limits  CBC WITH DIFFERENTIAL/PLATELET - Abnormal; Notable for the following components:   RBC 3.99 (*)    Hemoglobin 11.0 (*)    HCT 34.5 (*)    Lymphs Abs 0.4 (*)    Abs Immature Granulocytes 1.90 (*)    All other components  within normal limits  PROTIME-INR - Abnormal; Notable for the following components:   Prothrombin Time 15.4 (*)    INR 1.3 (*)    All other components within normal limits  URINALYSIS, ROUTINE W REFLEX MICROSCOPIC - Abnormal; Notable for the following components:   APPearance HAZY (*)    Glucose, UA >=500 (*)    Hgb urine dipstick LARGE (*)    Protein, ur 30 (*)    All other components within normal limits  CBG MONITORING, ED - Abnormal; Notable for the following components:   Glucose-Capillary >600 (*)    All other components within normal limits  CULTURE, BLOOD (ROUTINE X 2)  CULTURE, BLOOD (ROUTINE X 2)  URINE CULTURE  RESPIRATORY PANEL BY RT PCR (FLU A&B, COVID)  MRSA PCR SCREENING  APTT  LACTIC ACID, PLASMA  BETA-HYDROXYBUTYRIC ACID  BETA-HYDROXYBUTYRIC ACID  HEMOGLOBIN A1C  TYPE AND SCREEN    EKG None  Radiology DG Chest Port 1 View  Result Date: 02/22/2020 CLINICAL DATA:  Sepsis EXAM: PORTABLE CHEST 1 VIEW COMPARISON:  February 14, 2020 FINDINGS: The heart size and mediastinal contours are within normal limits. Both lungs are clear. The visualized skeletal structures are unremarkable. IMPRESSION: No active disease. Electronically Signed   By: Abelardo Diesel M.D.   On: 02/22/2020 14:08    Procedures Procedures (including critical care time)  Medications Ordered in ED Medications  vancomycin (VANCOREADY) IVPB 2000 mg/400 mL (2,000 mg Intravenous New Bag/Given 02/22/20 1259)  lactated ringers bolus 1,814 mL (has no administration in time range)  insulin regular, human (MYXREDLIN) 100 units/ 100 mL infusion (11.5 Units/hr Intravenous New Bag/Given 02/22/20 1404)  lactated ringers  infusion (has no administration in time range)  dextrose 5 % in lactated ringers infusion (has no administration in time range)  dextrose 50 % solution 0-50 mL (has no administration in time range)  vancomycin (VANCOCIN) IVPB 1000 mg/200 mL premix (has no administration in time range)  ceFEPIme (MAXIPIME) 2 g in sodium chloride 0.9 % 100 mL IVPB (0 g Intravenous Stopped 02/22/20 1406)  metroNIDAZOLE (FLAGYL) IVPB 500 mg (0 mg Intravenous Stopped 02/22/20 1406)  sodium chloride 0.9 % bolus 3,000 mL (3,000 mLs Intravenous New Bag/Given 02/22/20 1214)  pantoprazole (PROTONIX) injection 40 mg (40 mg Intravenous Given 02/22/20 1257)    ED Course  I have reviewed the triage vital signs and the nursing notes.  Pertinent labs & imaging results that were available during my care of the patient were reviewed by me and considered in my medical decision making (see chart for details). CRITICAL CARE Performed by: Milton Ferguson Total critical care time: 55 minutes Critical care time was exclusive of separately billable procedures and treating other patients. Critical care was necessary to treat or prevent imminent or life-threatening deterioration. Critical care was time spent personally by me on the following activities: development of treatment plan with patient and/or surrogate as well as nursing, discussions with consultants, evaluation of patient's response to treatment, examination of patient, obtaining history from patient or surrogate, ordering and performing treatments and interventions, ordering and review of laboratory studies, ordering and review of radiographic studies, pulse oximetry and re-evaluation of patient's condition.    MDM Rules/Calculators/A&P                          Patient with DKA and febrile illness possible urosepsis possible Covid infection.  He will be admitted to medicine     This patient presents to the ED  for concern of weakness confusion, this involves an extensive  number of treatment options, and is a complaint that carries with it a high risk of complications and morbidity.  The differential diagnosis includes sepsis stroke   Lab Tests:   I Ordered, reviewed, and interpreted labs, which included CBC chemistries which showed glucose of 800 renal failure and anemia  Medicines ordered:   I ordered medication insulin for elevated glucose and antibiotics per sepsis protocol  Imaging Studies ordered:   I ordered imaging studies which included chest x-ray  I independently visualized and interpreted imaging which showed negative  Additional history obtained:   Additional history obtained from record  Previous records obtained and reviewed.  Consultations Obtained:   I consulted hospital and discussed lab and imaging findings  Reevaluation:  After the interventions stated above, I reevaluated the patient and found no change  Critical Interventions:  .   Final Clinical Impression(s) / ED Diagnoses Final diagnoses:  Acute sepsis [facility])    Rx / DC Orders ED Discharge Orders    None       Milton Ferguson, MD 02/22/20 1727

## 2020-02-22 NOTE — ED Notes (Signed)
Pt placed on cooling blanket. 

## 2020-02-22 NOTE — Progress Notes (Addendum)
Pharmacy Antibiotic Note  Paul Perez is a 86 y.o. diabetic male admitted on 02/22/2020 with  sepsis.  Pharmacy has been consulted for vancomycin and cefepime  dosing.  Patient has  CKD stage 3.  Plan: Start cefepime 2g IV q24h Continue metronidazole 500mg IV q8h  Vancomycin 2g IV x1 dose now, then  1g IV q48h Goal vancomycin trough range: 15-20 mcg/mL Pharmacy will continue to monitor renal function, vancomycin troughs as clinically appropriate,  cultures and patient progress.  Height: 6' (182.9 cm) Weight: 90.7 kg (199 lb 15.3 oz) IBW/kg (Calculated) : 77.6  Temp (24hrs), Avg:101.8 F (38.8 C), Min:101.8 F (38.8 C), Max:101.8 F (38.8 C)  Recent Labs  Lab 02/22/20 1220  WBC 8.1  CREATININE 3.79*  LATICACIDVEN 5.1*    Estimated Creatinine Clearance: 14.2 mL/min (A) (by C-G formula based on SCr of 3.79 mg/dL (H)).     Antimicrobials this admission: cefepime 10/9 >>   vancomycin 10/9 >>   metronidazole 10/9>>   Dose adjustments this admission: Vancomycin, cefepime  Microbiology results: 10/9 BC x2:  10/9 UCx:     10/1 UCx: >100,000 E. Coli (pan sens) 10/9  MRSA PCR:  negative  Thank you for allowing pharmacy to be a part of this patient's care.  Despina Pole 02/22/2020 1:17 PM

## 2020-02-22 NOTE — ED Triage Notes (Signed)
[facility] bedside, EMS do not have a time.  Woke up this am not able to walk,  CBG reads high, AMS.  Right sided lean.  Gait is usually normal but not able to ambulate and pt slid into floor.  Recent treated for UTI.  Sats 84 % on EMS arrival, placed on O2@2l /m and Sats 97%.  Dr Roderic Palau in to assess.

## 2020-02-22 NOTE — ED Notes (Signed)
CRITICAL VALUE ALERT  Critical Value:  Lactic acid 5.1, glucose 802  Date & Time Notied:  02/22/2020, 1300  Provider Notified: Dr. Roderic Palau  Orders Received/Actions taken: see chart

## 2020-02-22 NOTE — ED Notes (Signed)
CRITICAL VALUE ALERT  Critical Value:  Lactic acid 3.5  Date & Time Notied:  02/22/2020  Provider Notified:Dr. Roderic Palau  Orders Received/Actions taken: see chart

## 2020-02-23 ENCOUNTER — Inpatient Hospital Stay (HOSPITAL_COMMUNITY): Payer: Medicare Other

## 2020-02-23 DIAGNOSIS — A419 Sepsis, unspecified organism: Secondary | ICD-10-CM | POA: Diagnosis not present

## 2020-02-23 DIAGNOSIS — R6520 Severe sepsis without septic shock: Secondary | ICD-10-CM | POA: Diagnosis not present

## 2020-02-23 LAB — CBC
HCT: 29.5 % — ABNORMAL LOW (ref 39.0–52.0)
Hemoglobin: 9.7 g/dL — ABNORMAL LOW (ref 13.0–17.0)
MCH: 27.7 pg (ref 26.0–34.0)
MCHC: 32.9 g/dL (ref 30.0–36.0)
MCV: 84.3 fL (ref 80.0–100.0)
Platelets: 148 10*3/uL — ABNORMAL LOW (ref 150–400)
RBC: 3.5 MIL/uL — ABNORMAL LOW (ref 4.22–5.81)
RDW: 13.1 % (ref 11.5–15.5)
WBC: 6.2 10*3/uL (ref 4.0–10.5)
nRBC: 0 % (ref 0.0–0.2)

## 2020-02-23 LAB — BLOOD CULTURE ID PANEL (REFLEXED) - BCID2

## 2020-02-23 LAB — COMPREHENSIVE METABOLIC PANEL
ALT: 43 U/L (ref 0–44)
AST: 114 U/L — ABNORMAL HIGH (ref 15–41)
Albumin: 2.5 g/dL — ABNORMAL LOW (ref 3.5–5.0)
Alkaline Phosphatase: 48 U/L (ref 38–126)
Anion gap: 8 (ref 5–15)
BUN: 64 mg/dL — ABNORMAL HIGH (ref 8–23)
CO2: 21 mmol/L — ABNORMAL LOW (ref 22–32)
Calcium: 8.1 mg/dL — ABNORMAL LOW (ref 8.9–10.3)
Chloride: 107 mmol/L (ref 98–111)
Creatinine, Ser: 3.52 mg/dL — ABNORMAL HIGH (ref 0.61–1.24)
GFR, Estimated: 14 mL/min — ABNORMAL LOW (ref >60)
Glucose, Bld: 174 mg/dL — ABNORMAL HIGH (ref 70–99)
Potassium: 4.1 mmol/L (ref 3.5–5.1)
Sodium: 136 mmol/L (ref 135–145)
Total Bilirubin: 0.6 mg/dL (ref 0.3–1.2)
Total Protein: 5.4 g/dL — ABNORMAL LOW (ref 6.5–8.1)

## 2020-02-23 LAB — GLUCOSE, CAPILLARY
Glucose-Capillary: 114 mg/dL — ABNORMAL HIGH (ref 70–99)
Glucose-Capillary: 118 mg/dL — ABNORMAL HIGH (ref 70–99)
Glucose-Capillary: 148 mg/dL — ABNORMAL HIGH (ref 70–99)
Glucose-Capillary: 154 mg/dL — ABNORMAL HIGH (ref 70–99)
Glucose-Capillary: 155 mg/dL — ABNORMAL HIGH (ref 70–99)
Glucose-Capillary: 158 mg/dL — ABNORMAL HIGH (ref 70–99)
Glucose-Capillary: 158 mg/dL — ABNORMAL HIGH (ref 70–99)
Glucose-Capillary: 164 mg/dL — ABNORMAL HIGH (ref 70–99)
Glucose-Capillary: 166 mg/dL — ABNORMAL HIGH (ref 70–99)
Glucose-Capillary: 168 mg/dL — ABNORMAL HIGH (ref 70–99)
Glucose-Capillary: 174 mg/dL — ABNORMAL HIGH (ref 70–99)
Glucose-Capillary: 202 mg/dL — ABNORMAL HIGH (ref 70–99)
Glucose-Capillary: 99 mg/dL (ref 70–99)

## 2020-02-23 LAB — BASIC METABOLIC PANEL
Anion gap: 14 (ref 5–15)
BUN: 62 mg/dL — ABNORMAL HIGH (ref 8–23)
CO2: 19 mmol/L — ABNORMAL LOW (ref 22–32)
Calcium: 8.7 mg/dL — ABNORMAL LOW (ref 8.9–10.3)
Chloride: 105 mmol/L (ref 98–111)
Creatinine, Ser: 3.39 mg/dL — ABNORMAL HIGH (ref 0.61–1.24)
GFR, Estimated: 15 mL/min — ABNORMAL LOW (ref >60)
Glucose, Bld: 91 mg/dL (ref 70–99)
Potassium: 4.3 mmol/L (ref 3.5–5.1)
Sodium: 138 mmol/L (ref 135–145)

## 2020-02-23 LAB — CORTISOL-AM, BLOOD: Cortisol - AM: 68.2 ug/dL — ABNORMAL HIGH (ref 6.7–22.6)

## 2020-02-23 LAB — PROCALCITONIN: Procalcitonin: >150 ng/mL

## 2020-02-23 LAB — PROTIME-INR
INR: 1.3 — ABNORMAL HIGH (ref 0.8–1.2)
Prothrombin Time: 15.3 seconds — ABNORMAL HIGH (ref 11.4–15.2)

## 2020-02-23 LAB — BETA-HYDROXYBUTYRIC ACID
Beta-Hydroxybutyric Acid: 0.07 mmol/L (ref 0.05–0.27)
Beta-Hydroxybutyric Acid: 0.07 mmol/L (ref 0.05–0.27)

## 2020-02-23 MED ORDER — INSULIN DETEMIR 100 UNIT/ML ~~LOC~~ SOLN
16.0000 [IU] | Freq: Every day | SUBCUTANEOUS | Status: DC
  Filled 2020-02-23 (×2): qty 0.16

## 2020-02-23 MED ORDER — ACETAMINOPHEN 650 MG RE SUPP
650.0000 mg | Freq: Four times a day (QID) | RECTAL | Status: DC | PRN
  Administered 2020-02-24: 650 mg via RECTAL
  Filled 2020-02-23: qty 1

## 2020-02-23 MED ORDER — ENOXAPARIN SODIUM 30 MG/0.3ML ~~LOC~~ SOLN
30.0000 mg | SUBCUTANEOUS | Status: DC
  Administered 2020-02-23 – 2020-02-28 (×6): 30 mg via SUBCUTANEOUS
  Filled 2020-02-23 (×6): qty 0.3

## 2020-02-23 MED ORDER — ACETAMINOPHEN 325 MG PO TABS
650.0000 mg | ORAL_TABLET | Freq: Four times a day (QID) | ORAL | Status: DC | PRN
  Administered 2020-02-26 – 2020-03-06 (×3): 650 mg via ORAL
  Filled 2020-02-23 (×3): qty 2

## 2020-02-23 MED ORDER — CHLORHEXIDINE GLUCONATE CLOTH 2 % EX PADS
6.0000 | MEDICATED_PAD | Freq: Every day | CUTANEOUS | Status: DC
  Administered 2020-02-24 – 2020-03-06 (×10): 6 via TOPICAL

## 2020-02-23 MED ORDER — INSULIN DETEMIR 100 UNIT/ML ~~LOC~~ SOLN
16.0000 [IU] | Freq: Every day | SUBCUTANEOUS | Status: DC
  Administered 2020-02-23 – 2020-02-25 (×3): 16 [IU] via SUBCUTANEOUS
  Filled 2020-02-23 (×5): qty 0.16

## 2020-02-23 MED ORDER — INSULIN ASPART 100 UNIT/ML ~~LOC~~ SOLN
0.0000 [IU] | SUBCUTANEOUS | Status: DC
  Administered 2020-02-23: 2 [IU] via SUBCUTANEOUS
  Administered 2020-02-23: 1 [IU] via SUBCUTANEOUS
  Administered 2020-02-24: 3 [IU] via SUBCUTANEOUS
  Administered 2020-02-24: 1 [IU] via SUBCUTANEOUS
  Administered 2020-02-24: 2 [IU] via SUBCUTANEOUS
  Administered 2020-02-24: 3 [IU] via SUBCUTANEOUS
  Administered 2020-02-24 – 2020-02-25 (×4): 1 [IU] via SUBCUTANEOUS
  Administered 2020-02-25 (×2): 3 [IU] via SUBCUTANEOUS
  Administered 2020-02-25: 2 [IU] via SUBCUTANEOUS
  Administered 2020-02-25 – 2020-02-26 (×2): 3 [IU] via SUBCUTANEOUS
  Administered 2020-02-26: 2 [IU] via SUBCUTANEOUS

## 2020-02-23 MED ORDER — HYDRALAZINE HCL 20 MG/ML IJ SOLN
10.0000 mg | Freq: Once | INTRAMUSCULAR | Status: AC
  Administered 2020-02-23: 10 mg via INTRAVENOUS
  Filled 2020-02-23: qty 1

## 2020-02-23 MED ORDER — CLONIDINE HCL 0.1 MG/24HR TD PTWK
0.1000 mg | MEDICATED_PATCH | TRANSDERMAL | Status: DC
  Administered 2020-02-23 – 2020-03-01 (×2): 0.1 mg via TRANSDERMAL
  Filled 2020-02-23 (×3): qty 1

## 2020-02-23 MED ORDER — LABETALOL HCL 5 MG/ML IV SOLN
5.0000 mg | INTRAVENOUS | Status: DC | PRN
  Administered 2020-02-23 – 2020-02-24 (×3): 5 mg via INTRAVENOUS
  Filled 2020-02-23 (×3): qty 4

## 2020-02-23 MED ORDER — METOPROLOL TARTRATE 5 MG/5ML IV SOLN
5.0000 mg | Freq: Once | INTRAVENOUS | Status: AC
  Administered 2020-02-23: 5 mg via INTRAVENOUS
  Filled 2020-02-23: qty 5

## 2020-02-23 MED ORDER — HYDRALAZINE HCL 20 MG/ML IJ SOLN
10.0000 mg | Freq: Three times a day (TID) | INTRAMUSCULAR | Status: DC | PRN
  Administered 2020-02-23 – 2020-02-28 (×9): 10 mg via INTRAVENOUS
  Filled 2020-02-23 (×10): qty 1

## 2020-02-23 MED ORDER — ORAL CARE MOUTH RINSE
15.0000 mL | Freq: Two times a day (BID) | OROMUCOSAL | Status: DC
  Administered 2020-02-23 – 2020-03-06 (×23): 15 mL via OROMUCOSAL

## 2020-02-23 NOTE — Consult Note (Signed)
[name]  INPATIENT CONSULTATION  Reason for Consultation: AKI Requesting Provider: Dr. Josephine Cables  HPI: Paul Perez is an 86 y.o. male with dementia, DM, HTN, h/o GIB currently admitted with GNR septic shock (pseudomonas bacteremia) and nephrology is consulted for evaluation and management of AKI on CKD.   Presented yesterday with confusion and fever, generalized weakness.  He was febrile, tachycardic, hypertensive.  Na 127, K 5.1, BUN 62, Cr 3.8, Lactate 5, procalcitonin > 150, BG 802, ^ beta hydroxybutyrate, transaminitis.  UA/microscopy with blood and WBC; UA 1 week prior without blood on microscopy.   He was volume expanded and treated with vanc, flagyl, cefepime.  Insulin gtt for DKA.  Blood cultures are showing GNR.  He has remained febrile into this morning.  Abd imaging is pending.   Creatinine initially 3.8 and is 3.5 this AM.  I/Os yesterday 3.7 / 1.2L.    Appears his baseline creatinine is around 3 recently.  He saw Dr. [name] in 11/2018 with plans for 68mo f/u.   He can offer no history currently.   PMH: Past Medical History:  Diagnosis Date  . Dementia ([place])   . Diabetes mellitus without complication ([place])   . Encephalopathy   . Gastritis 2004  . GIB (gastrointestinal bleeding) 2004  . Hypertension   . Poor historian   . Sinus bradycardia seen on cardiac monitor    PSH: Past Surgical History:  Procedure Laterality Date  . INGUINAL HERNIA REPAIR  09/2010   Bilateral  . UPPER GASTROINTESTINAL ENDOSCOPY  2004   Past Medical History:  Diagnosis Date  . Dementia ([place])   . Diabetes mellitus without complication ([place])   . Encephalopathy   . Gastritis 2004  . GIB (gastrointestinal bleeding) 2004  . Hypertension   . Poor historian   . Sinus bradycardia seen on cardiac monitor     Medications:  I have reviewed the patient's current medications.  Medications Prior to Admission  Medication Sig Dispense Refill  . Balsam [place]-Castor Oil  (VENELEX) OINT Apply liberal amount to coccyx, sacrum and bilateral buttocks q shift and prn after incontinence episodes for skin protection    . busPIRone (BUSPAR) 5 MG tablet Take 5 mg by mouth 2 (two) times daily.    . cephALEXin (KEFLEX) 500 MG capsule Take 1 capsule (500 mg total) by mouth 4 (four) times daily. 28 capsule 0  . cloNIDine (CATAPRES) 0.1 MG tablet Take 0.1 mg by mouth 2 (two) times daily.    Marland Kitchen donepezil (ARICEPT) 5 MG tablet Take 5 mg by mouth at bedtime.    . furosemide (LASIX) 40 MG tablet Take 40 mg by mouth.    . hydrALAZINE (APRESOLINE) 25 MG tablet Take 50 mg by mouth 3 (three) times daily.     . iron polysaccharides (NIFEREX) 150 MG capsule Take 150 mg by mouth daily.    . pioglitazone (ACTOS) 15 MG tablet Take 1 tablet by mouth daily.  0  . polyethylene glycol (MIRALAX / GLYCOLAX) packet Take 17 g by mouth daily as needed for mild constipation.    . temazepam (RESTORIL) 7.5 MG capsule Take 1 capsule (7.5 mg total) by mouth at bedtime. 14 capsule 0  . Vitamin D, Cholecalciferol, 1000 units TABS Take 1 tablet by mouth once a day      ALLERGIES:  No Known Allergies  FAM HX: Family History  Problem Relation Age of Onset  . Diabetes Mother   . Diabetes Sister     Social History:  reports that he has never smoked. He has never used smokeless tobacco. He reports that he does not drink alcohol and does not use drugs.  ROS: unable to obtain given patient dementia  Blood pressure (!) 155/89, pulse 67, temperature 99.8 F (37.7 C), temperature source Bladder, resp. rate (!) 21, height 6' (1.829 m), weight 81.1 kg, SpO2 100 %. PHYSICAL EXAM: Gen: elderly, nontoxic appearing comfortably sitting in bed  Eyes: anicteric, EOMI ENT: MMM CV:  RRR, no rub Abd:  Soft, nontender Lungs: clear, 100% on 2L [place] Extr:  No edema Neuro: sleepy, unable to answer orientation questions Skin: cool and dry   Results for orders placed or performed during the hospital encounter of  02/22/20 (from the past 48 hour(s))  CBG monitoring, ED     Status: Abnormal   Collection Time: 02/22/20 11:50 AM  Result Value Ref Range   Glucose-Capillary >600 (HH) 70 - 99 mg/dL    Comment: Glucose reference range applies only to samples taken after fasting for at least 8 hours.  Urinalysis, Routine w reflex microscopic Urine, Clean Catch     Status: Abnormal   Collection Time: 02/22/20 12:05 PM  Result Value Ref Range   Color, Urine YELLOW YELLOW   APPearance HAZY (A) CLEAR   Specific Gravity, Urine 1.015 1.005 - 1.030   pH 5.0 5.0 - 8.0   Glucose, UA >=500 (A) NEGATIVE mg/dL   Hgb urine dipstick LARGE (A) NEGATIVE   Bilirubin Urine NEGATIVE NEGATIVE   Ketones, ur NEGATIVE NEGATIVE mg/dL   Protein, ur 30 (A) NEGATIVE mg/dL   Nitrite NEGATIVE NEGATIVE   Leukocytes,Ua NEGATIVE NEGATIVE   RBC / HPF 11-20 0 - 5 RBC/hpf   WBC, UA 6-10 0 - 5 WBC/hpf   Bacteria, UA NONE SEEN NONE SEEN   Mucus PRESENT     Comment: Performed at [facility], [address] [place], [place] [address]  Respiratory Panel by RT PCR (Flu A&B, Covid) - Nasopharyngeal Swab     Status: None   Collection Time: 02/22/20 12:10 PM   Specimen: Nasopharyngeal Swab  Result Value Ref Range   SARS Coronavirus 2 by RT PCR NEGATIVE NEGATIVE    Comment: (NOTE) SARS-CoV-2 target nucleic acids are NOT DETECTED.  The SARS-CoV-2 RNA is generally detectable in upper respiratoy specimens during the acute phase of infection. The lowest concentration of SARS-CoV-2 viral copies this assay can detect is 131 copies/mL. A negative result does not preclude SARS-Cov-2 infection and should not be used as the sole basis for treatment or other patient management decisions. A negative result may occur with  improper specimen collection/handling, submission of specimen other than nasopharyngeal swab, presence of viral mutation(s) within the areas targeted by this assay, and inadequate number of viral copies (<131 copies/mL). A  negative result must be combined with clinical observations, patient history, and epidemiological information. The expected result is Negative.  Fact Sheet for Patients:  PinkCheek.be  Fact Sheet for Healthcare Providers:  GravelBags.it  This test is no t yet approved or cleared by the [place] FDA and  has been authorized for detection and/or diagnosis of SARS-CoV-2 by FDA under an Emergency Use Authorization (EUA). This EUA will remain  in effect (meaning this test can be used) for the duration of the COVID-19 declaration under Section 564(b)(1) of the Act, 21 U.S.C. section 360bbb-3(b)(1), unless the authorization is terminated or revoked sooner.     Influenza A by PCR NEGATIVE NEGATIVE   Influenza B by PCR NEGATIVE NEGATIVE  Comment: (NOTE) The Xpert Xpress SARS-CoV-2/FLU/RSV assay is intended as an aid in  the diagnosis of influenza from Nasopharyngeal swab specimens and  should not be used as a sole basis for treatment. Nasal washings and  aspirates are unacceptable for Xpert Xpress SARS-CoV-2/FLU/RSV  testing.  Fact Sheet for Patients: PinkCheek.be  Fact Sheet for Healthcare Providers: GravelBags.it  This test is not yet approved or cleared by the [place] FDA and  has been authorized for detection and/or diagnosis of SARS-CoV-2 by  FDA under an Emergency Use Authorization (EUA). This EUA will remain  in effect (meaning this test can be used) for the duration of the  Covid-19 declaration under Section 564(b)(1) of the Act, 21  U.S.C. section 360bbb-3(b)(1), unless the authorization is  terminated or revoked. Performed at [facility], [address], [place], [place] [address]   Lactic acid, plasma     Status: Abnormal   Collection Time: 02/22/20 12:20 PM  Result Value Ref Range   Lactic Acid, Venous 5.1 (HH) 0.5 - 1.9 mmol/L    Comment:  CRITICAL RESULT CALLED TO, READ BACK BY AND VERIFIED WITH: CARDWELL @ 1258 ON 100921 BY HENDERSON L. Performed at [facility], [address] [place], [place] [address]   Comprehensive metabolic panel     Status: Abnormal   Collection Time: 02/22/20 12:20 PM  Result Value Ref Range   Sodium 127 (L) 135 - 145 mmol/L   Potassium 5.1 3.5 - 5.1 mmol/L   Chloride 91 (L) 98 - 111 mmol/L   CO2 19 (L) 22 - 32 mmol/L   Glucose, Bld 802 (HH) 70 - 99 mg/dL    Comment: Glucose reference range applies only to samples taken after fasting for at least 8 hours. RESULTS CONFIRMED BY MANUAL DILUTION CRITICAL RESULT CALLED TO, READ BACK BY AND VERIFIED WITH: CARDWELL @ 1305 ON 100921 BY HENDESON L.    BUN 62 (H) 8 - 23 mg/dL   Creatinine, Ser 3.79 (H) 0.61 - 1.24 mg/dL   Calcium 9.1 8.9 - 10.3 mg/dL   Total Protein 7.1 6.5 - 8.1 g/dL   Albumin 3.5 3.5 - 5.0 g/dL   AST 154 (H) 15 - 41 U/L   ALT 55 (H) 0 - 44 U/L   Alkaline Phosphatase 79 38 - 126 U/L   Total Bilirubin 0.9 0.3 - 1.2 mg/dL   GFR, Estimated 13 (L) >60 mL/min   Anion gap 17 (H) 5 - 15    Comment: Performed at [facility], [address], [place], [place] [address]  CBC WITH DIFFERENTIAL     Status: Abnormal   Collection Time: 02/22/20 12:20 PM  Result Value Ref Range   WBC 8.1 4.0 - 10.5 K/uL   RBC 3.99 (L) 4.22 - 5.81 MIL/uL   Hemoglobin 11.0 (L) 13.0 - 17.0 g/dL   HCT 34.5 (L) 39 - 52 %   MCV 86.5 80.0 - 100.0 fL   MCH 27.6 26.0 - 34.0 pg   MCHC 31.9 30.0 - 36.0 g/dL   RDW 13.0 11.5 - 15.5 %   Platelets 203 150 - 400 K/uL   nRBC 0.0 0.0 - 0.2 %   Neutrophils Relative % 74 %   Neutro Abs 7.3 1.7 - 7.7 K/uL   Band Neutrophils 16 %   Lymphocytes Relative 5 %   Lymphs Abs 0.4 (L) 0.7 - 4.0 K/uL   Monocytes Relative 5 %   Monocytes Absolute 0.4 0.1 - 1.0 K/uL   Eosinophils Relative 0 %  Eosinophils Absolute 0.0 0 - 0 K/uL   Basophils Relative 0 %   Basophils Absolute 0.0 0 - 0 K/uL   Abs Immature Granulocytes 1.90 (H)  0.00 - 0.07 K/uL   Abnormal Lymphocytes Present PRESENT     Comment: Performed at [facility], [address], [place], [place] [address]  Protime-INR     Status: Abnormal   Collection Time: 02/22/20 12:20 PM  Result Value Ref Range   Prothrombin Time 15.4 (H) 11.4 - 15.2 seconds   INR 1.3 (H) 0.8 - 1.2    Comment: (NOTE) INR goal varies based on device and disease states. Performed at [facility], [address], [place], [place] [address]   APTT     Status: None   Collection Time: 02/22/20 12:20 PM  Result Value Ref Range   aPTT 31 24 - 36 seconds    Comment: Performed at [facility], [address], [place], [place] [address]  Blood Culture (routine x 2)     Status: None (Preliminary result)   Collection Time: 02/22/20 12:20 PM   Specimen: BLOOD LEFT HAND  Result Value Ref Range   Specimen Description      BLOOD LEFT HAND BOTTLES DRAWN AEROBIC AND ANAEROBIC Performed at [facility], [address] [place], [place] [address]    Special Requests      Blood Culture adequate volume Performed at [facility], [address], [place], [place] [address]    Culture  Setup Time      GRAM NEGATIVE RODS Gram Stain Report Called to,Read Back By and Verified With: DANIELS,J @ [place] ON 02/23/20 BY JUW AEROBIC BOTTLE ONLY GS DONE @ [facility] Performed at [facility], [address], [place], [place] [address]    Culture GRAM NEGATIVE RODS    Report Status PENDING   Blood Culture (routine x 2)     Status: None (Preliminary result)   Collection Time: 02/22/20 12:20 PM   Specimen: Right Antecubital; Blood  Result Value Ref Range   Specimen Description      RIGHT ANTECUBITAL BOTTLES DRAWN AEROBIC AND ANAEROBIC Performed at [facility], [address] [place], [place] [address]    Special Requests      Blood Culture adequate volume Performed at [facility], [address], [place], [place] [address]    Culture  Setup Time      GRAM NEGATIVE RODS Gram Stain Report Called to,Read Back By  and Verified With: DANIELS,J @ 0535 ON 02/23/20 BY JUW AEROBIC BOTTLE ONLY GS DONE @ [facility] Organism ID to follow Performed at [facility], [place] [address], [place], [place] [address]    Culture GRAM NEGATIVE RODS    Report Status PENDING   Type and screen     Status: None   Collection Time: 02/22/20 12:20 PM  Result Value Ref Range   ABO/RH(D) A POS    Antibody Screen NEG    Sample Expiration      02/25/2020,2359 Performed at [facility], [address], [place],  [address]   Blood Culture ID Panel (Reflexed)     Status: Abnormal   Collection Time: 02/22/20 12:20 PM  Result Value Ref Range   Enterococcus faecalis NOT DETECTED NOT DETECTED   Enterococcus Faecium NOT DETECTED NOT DETECTED   Listeria monocytogenes NOT DETECTED NOT DETECTED   Staphylococcus species NOT DETECTED NOT DETECTED   Staphylococcus aureus (BCID) NOT DETECTED NOT DETECTED   Staphylococcus epidermidis NOT DETECTED NOT DETECTED   Staphylococcus lugdunensis  NOT DETECTED NOT DETECTED   Streptococcus species NOT DETECTED NOT DETECTED   Streptococcus agalactiae NOT DETECTED NOT DETECTED   Streptococcus pneumoniae NOT DETECTED NOT DETECTED   Streptococcus pyogenes NOT DETECTED NOT DETECTED   A.calcoaceticus-baumannii NOT DETECTED NOT DETECTED   Bacteroides fragilis NOT DETECTED NOT DETECTED   Enterobacterales NOT DETECTED NOT DETECTED   Enterobacter cloacae complex NOT DETECTED NOT DETECTED   Escherichia coli NOT DETECTED NOT DETECTED   Klebsiella aerogenes NOT DETECTED NOT DETECTED   Klebsiella oxytoca NOT DETECTED NOT DETECTED   Klebsiella pneumoniae NOT DETECTED NOT DETECTED   Proteus species NOT DETECTED NOT DETECTED   Salmonella species NOT DETECTED NOT DETECTED   Serratia marcescens NOT DETECTED NOT DETECTED   Haemophilus influenzae NOT DETECTED NOT DETECTED   Neisseria meningitidis NOT DETECTED NOT DETECTED   Pseudomonas aeruginosa DETECTED (A) NOT DETECTED    Comment: CRITICAL RESULT  CALLED TO, READ BACK BY AND VERIFIED WITH: Hyman Hopes RN, AT 1245 02/23/20 BY D. VANHOOK    Stenotrophomonas maltophilia NOT DETECTED NOT DETECTED   Candida albicans NOT DETECTED NOT DETECTED   Candida auris NOT DETECTED NOT DETECTED   Candida glabrata NOT DETECTED NOT DETECTED   Candida krusei NOT DETECTED NOT DETECTED   Candida parapsilosis NOT DETECTED NOT DETECTED   Candida tropicalis NOT DETECTED NOT DETECTED   Cryptococcus neoformans/gattii NOT DETECTED NOT DETECTED   CTX-M ESBL NOT DETECTED NOT DETECTED   Carbapenem resistance IMP NOT DETECTED NOT DETECTED   Carbapenem resistance KPC NOT DETECTED NOT DETECTED   Carbapenem resistance NDM NOT DETECTED NOT DETECTED   Carbapenem resistance VIM NOT DETECTED NOT DETECTED    Comment: Performed at [facility], [address], [place], [place] [address]  MRSA PCR Screening     Status: None   Collection Time: 02/22/20 12:28 PM   Specimen: Nasopharyngeal  Result Value Ref Range   MRSA by PCR NEGATIVE NEGATIVE    Comment:        The GeneXpert MRSA Assay (FDA approved for NASAL specimens only), is one component of a comprehensive MRSA colonization surveillance program. It is not intended to diagnose MRSA infection nor to guide or monitor treatment for MRSA infections. Performed at [facility], [address] [place], [place] [address]   Lactic acid, plasma     Status: Abnormal   Collection Time: 02/22/20  1:43 PM  Result Value Ref Range   Lactic Acid, Venous 3.5 (HH) 0.5 - 1.9 mmol/L    Comment: CRITICAL RESULT CALLED TO, READ BACK BY AND VERIFIED WITH: CARDWELL L. @ 1423 ON 100921 BY HENDERSON L. Performed at [facility], [address], [place], [place] [address]   Beta-hydroxybutyric acid     Status: Abnormal   Collection Time: 02/22/20  1:43 PM  Result Value Ref Range   Beta-Hydroxybutyric Acid 1.99 (H) 0.05 - 0.27 mmol/L    Comment: Performed at [facility], [address], [place], [place] [address]  CBG  monitoring, ED     Status: Abnormal   Collection Time: 02/22/20  2:31 PM  Result Value Ref Range   Glucose-Capillary >600 (HH) 70 - 99 mg/dL    Comment: Glucose reference range applies only to samples taken after fasting for at least 8 hours.  CBG monitoring, ED     Status: Abnormal   Collection Time: 02/22/20  3:09 PM  Result Value Ref Range   Glucose-Capillary 595 (HH) 70 - 99 mg/dL    Comment: Glucose reference range applies only to  samples taken after fasting for at least 8 hours.   Comment 1 Notify RN    Comment 2 Document in Chart   Beta-hydroxybutyric acid     Status: Abnormal   Collection Time: 02/22/20  3:31 PM  Result Value Ref Range   Beta-Hydroxybutyric Acid 0.87 (H) 0.05 - 0.27 mmol/L    Comment: Performed at [facility], [address] [place], [place] [address]  CBG monitoring, ED     Status: Abnormal   Collection Time: 02/22/20  3:40 PM  Result Value Ref Range   Glucose-Capillary 530 (HH) 70 - 99 mg/dL    Comment: Glucose reference range applies only to samples taken after fasting for at least 8 hours.   Comment 1 Notify RN    Comment 2 Document in Chart   CBG monitoring, ED     Status: Abnormal   Collection Time: 02/22/20  4:04 PM  Result Value Ref Range   Glucose-Capillary 449 (H) 70 - 99 mg/dL    Comment: Glucose reference range applies only to samples taken after fasting for at least 8 hours.  CBG monitoring, ED     Status: Abnormal   Collection Time: 02/22/20  4:31 PM  Result Value Ref Range   Glucose-Capillary 429 (H) 70 - 99 mg/dL    Comment: Glucose reference range applies only to samples taken after fasting for at least 8 hours.  CBG monitoring, ED     Status: Abnormal   Collection Time: 02/22/20  5:04 PM  Result Value Ref Range   Glucose-Capillary 424 (H) 70 - 99 mg/dL    Comment: Glucose reference range applies only to samples taken after fasting for at least 8 hours.  Basic metabolic panel     Status: Abnormal   Collection Time: 02/22/20  5:35 PM   Result Value Ref Range   Sodium 136 135 - 145 mmol/L    Comment: DELTA CHECK NOTED   Potassium 4.2 3.5 - 5.1 mmol/L    Comment: DELTA CHECK NOTED   Chloride 104 98 - 111 mmol/L   CO2 17 (L) 22 - 32 mmol/L   Glucose, Bld 332 (H) 70 - 99 mg/dL    Comment: Glucose reference range applies only to samples taken after fasting for at least 8 hours.   BUN 60 (H) 8 - 23 mg/dL   Creatinine, Ser 3.57 (H) 0.61 - 1.24 mg/dL   Calcium 8.6 (L) 8.9 - 10.3 mg/dL   GFR, Estimated 14 (L) >60 mL/min   Anion gap 15 5 - 15    Comment: Performed at [facility], [address], [place], [place] [address]  CBG monitoring, ED     Status: Abnormal   Collection Time: 02/22/20  5:56 PM  Result Value Ref Range   Glucose-Capillary 332 (H) 70 - 99 mg/dL    Comment: Glucose reference range applies only to samples taken after fasting for at least 8 hours.  CBG monitoring, ED     Status: Abnormal   Collection Time: 02/22/20  6:43 PM  Result Value Ref Range   Glucose-Capillary 246 (H) 70 - 99 mg/dL    Comment: Glucose reference range applies only to samples taken after fasting for at least 8 hours.  CBG monitoring, ED     Status: Abnormal   Collection Time: 02/22/20  7:45 PM  Result Value Ref Range   Glucose-Capillary 189 (H) 70 - 99 mg/dL    Comment: Glucose reference range applies only to samples taken after fasting for at least  8 hours.  CBG monitoring, ED     Status: Abnormal   Collection Time: 02/22/20  8:50 PM  Result Value Ref Range   Glucose-Capillary 159 (H) 70 - 99 mg/dL    Comment: Glucose reference range applies only to samples taken after fasting for at least 8 hours.  CBG monitoring, ED     Status: Abnormal   Collection Time: 02/22/20 10:08 PM  Result Value Ref Range   Glucose-Capillary 197 (H) 70 - 99 mg/dL    Comment: Glucose reference range applies only to samples taken after fasting for at least 8 hours.  CBG monitoring, ED     Status: Abnormal   Collection Time: 02/22/20 11:04 PM  Result  Value Ref Range   Glucose-Capillary 145 (H) 70 - 99 mg/dL    Comment: Glucose reference range applies only to samples taken after fasting for at least 8 hours.  Beta-hydroxybutyric acid     Status: None   Collection Time: 02/22/20 11:50 PM  Result Value Ref Range   Beta-Hydroxybutyric Acid 0.07 0.05 - 0.27 mmol/L    Comment: Performed at [facility], [address], [place], [place] [address]  Basic metabolic panel     Status: Abnormal   Collection Time: 02/22/20 11:50 PM  Result Value Ref Range   Sodium 138 135 - 145 mmol/L   Potassium 4.3 3.5 - 5.1 mmol/L   Chloride 105 98 - 111 mmol/L   CO2 19 (L) 22 - 32 mmol/L   Glucose, Bld 91 70 - 99 mg/dL    Comment: Glucose reference range applies only to samples taken after fasting for at least 8 hours.   BUN 62 (H) 8 - 23 mg/dL   Creatinine, Ser 3.39 (H) 0.61 - 1.24 mg/dL   Calcium 8.7 (L) 8.9 - 10.3 mg/dL   GFR, Estimated 15 (L) >60 mL/min   Anion gap 14 5 - 15    Comment: Performed at [facility], [address], [place],  [address]  Glucose, capillary     Status: None   Collection Time: 02/23/20 12:02 AM  Result Value Ref Range   Glucose-Capillary 99 70 - 99 mg/dL    Comment: Glucose reference range applies only to samples taken after fasting for at least 8 hours.   Comment 1 Notify RN    Comment 2 Document in Chart   Glucose, capillary     Status: Abnormal   Collection Time: 02/23/20 12:58 AM  Result Value Ref Range   Glucose-Capillary 114 (H) 70 - 99 mg/dL    Comment: Glucose reference range applies only to samples taken after fasting for at least 8 hours.   Comment 1 Notify RN    Comment 2 Document in Chart   Glucose, capillary     Status: Abnormal   Collection Time: 02/23/20  2:03 AM  Result Value Ref Range   Glucose-Capillary 118 (H) 70 - 99 mg/dL    Comment: Glucose reference range applies only to samples taken after fasting for at least 8 hours.   Comment 1 Notify RN    Comment 2 Document in Chart   Glucose,  capillary     Status: Abnormal   Collection Time: 02/23/20  3:02 AM  Result Value Ref Range   Glucose-Capillary 148 (H) 70 - 99 mg/dL    Comment: Glucose reference range applies only to samples taken after fasting for at least 8 hours.  Protime-INR     Status: Abnormal   Collection Time: 02/23/20  4:03 AM  Result Value Ref Range   Prothrombin Time 15.3 (H) 11.4 - 15.2 seconds   INR 1.3 (H) 0.8 - 1.2    Comment: (NOTE) INR goal varies based on device and disease states. Performed at [facility], [address], [place], [place] [address]   Procalcitonin     Status: None   Collection Time: 02/23/20  4:03 AM  Result Value Ref Range   Procalcitonin >150.00 ng/mL    Comment:        Interpretation: PCT >= 10 ng/mL: Important systemic inflammatory response, almost exclusively due to severe bacterial sepsis or septic shock. (NOTE)       Sepsis PCT Algorithm           Lower Respiratory Tract                                      Infection PCT Algorithm    ----------------------------     ----------------------------         PCT < 0.25 ng/mL                PCT < 0.10 ng/mL          Strongly encourage             Strongly discourage   discontinuation of antibiotics    initiation of antibiotics    ----------------------------     -----------------------------       PCT 0.25 - 0.50 ng/mL            PCT 0.10 - 0.25 ng/mL               OR       >80% decrease in PCT            Discourage initiation of                                            antibiotics      Encourage discontinuation           of antibiotics    ----------------------------     -----------------------------         PCT >= 0.50 ng/mL              PCT 0.26 - 0.50 ng/mL                AND       <80% decrease in PCT             Encourage initiation of                                             antibiotics       Encourage continuation           of antibiotics    ----------------------------      -----------------------------        PCT >= 0.50 ng/mL                  PCT > 0.50 ng/mL               AND         increase in PCT  Strongly encourage                                      initiation of antibiotics    Strongly encourage escalation           of antibiotics                                     -----------------------------                                           PCT <= 0.25 ng/mL                                                 OR                                        > 80% decrease in PCT                                      Discontinue / Do not initiate                                             antibiotics  Performed at [facility], [address] [place], [place] [address]   Comprehensive metabolic panel     Status: Abnormal   Collection Time: 02/23/20  4:03 AM  Result Value Ref Range   Sodium 136 135 - 145 mmol/L   Potassium 4.1 3.5 - 5.1 mmol/L   Chloride 107 98 - 111 mmol/L   CO2 21 (L) 22 - 32 mmol/L   Glucose, Bld 174 (H) 70 - 99 mg/dL    Comment: Glucose reference range applies only to samples taken after fasting for at least 8 hours.   BUN 64 (H) 8 - 23 mg/dL   Creatinine, Ser 3.52 (H) 0.61 - 1.24 mg/dL   Calcium 8.1 (L) 8.9 - 10.3 mg/dL   Total Protein 5.4 (L) 6.5 - 8.1 g/dL   Albumin 2.5 (L) 3.5 - 5.0 g/dL   AST 114 (H) 15 - 41 U/L   ALT 43 0 - 44 U/L   Alkaline Phosphatase 48 38 - 126 U/L   Total Bilirubin 0.6 0.3 - 1.2 mg/dL   GFR, Estimated 14 (L) >60 mL/min   Anion gap 8 5 - 15    Comment: Performed at [facility], [address] [place], [place] [address]  CBC     Status: Abnormal   Collection Time: 02/23/20  4:03 AM  Result Value Ref Range   WBC 6.2 4.0 - 10.5 K/uL   RBC 3.50 (L) 4.22 - 5.81 MIL/uL   Hemoglobin 9.7 (L) 13.0 - 17.0 g/dL   HCT 29.5 (L) 39 - 52 %   MCV 84.3 80.0 - 100.0 fL  MCH 27.7 26.0 - 34.0 pg   MCHC 32.9 30.0 - 36.0 g/dL   RDW 13.1 11.5 - 15.5 %   Platelets 148 (L) 150 - 400 K/uL   nRBC 0.0 0.0  - 0.2 %    Comment: Performed at [facility], [address] [place], [place] [address]  Glucose, capillary     Status: Abnormal   Collection Time: 02/23/20  4:09 AM  Result Value Ref Range   Glucose-Capillary 164 (H) 70 - 99 mg/dL    Comment: Glucose reference range applies only to samples taken after fasting for at least 8 hours.   Comment 1 Notify RN    Comment 2 Document in Chart   Glucose, capillary     Status: Abnormal   Collection Time: 02/23/20  5:12 AM  Result Value Ref Range   Glucose-Capillary 155 (H) 70 - 99 mg/dL    Comment: Glucose reference range applies only to samples taken after fasting for at least 8 hours.  Beta-hydroxybutyric acid     Status: None   Collection Time: 02/23/20  6:10 AM  Result Value Ref Range   Beta-Hydroxybutyric Acid 0.07 0.05 - 0.27 mmol/L    Comment: Performed at [facility], [address], [place], [place] [address]  Glucose, capillary     Status: Abnormal   Collection Time: 02/23/20  6:14 AM  Result Value Ref Range   Glucose-Capillary 168 (H) 70 - 99 mg/dL    Comment: Glucose reference range applies only to samples taken after fasting for at least 8 hours.  Glucose, capillary     Status: Abnormal   Collection Time: 02/23/20  8:16 AM  Result Value Ref Range   Glucose-Capillary 158 (H) 70 - 99 mg/dL    Comment: Glucose reference range applies only to samples taken after fasting for at least 8 hours.  Glucose, capillary     Status: Abnormal   Collection Time: 02/23/20 10:35 AM  Result Value Ref Range   Glucose-Capillary 174 (H) 70 - 99 mg/dL    Comment: Glucose reference range applies only to samples taken after fasting for at least 8 hours.  Glucose, capillary     Status: Abnormal   Collection Time: 02/23/20 11:20 AM  Result Value Ref Range   Glucose-Capillary 166 (H) 70 - 99 mg/dL    Comment: Glucose reference range applies only to samples taken after fasting for at least 8 hours.    DG Chest Port 1 View  Result Date:  02/22/2020 CLINICAL DATA:  Sepsis EXAM: PORTABLE CHEST 1 VIEW COMPARISON:  February 14, 2020 FINDINGS: The heart size and mediastinal contours are within normal limits. Both lungs are clear. The visualized skeletal structures are unremarkable. IMPRESSION: No active disease. Electronically Signed   By: Abelardo Diesel M.D.   On: 02/22/2020 14:08    Assessment/Plan **Septic shock secondary to Pseudomonas bacteremia:  On appropriate antibiotic coverage per primary.  Abd imaging pending.  **AKI on CKD4:  Baseline 3, presenting Cr 3.8 now 3.5.  Nonoliguric. Presumably ischemic/hemodynamic mediated in face of septic shock.  Improved with resuscitation and sepsis therapies.  In light of advanced age and dementia I would not consider him a candidate for dialysis and thankfully he has no indications.  He has been previously seen by outpt nephrology and could be referred back at discharge.  On MIVF currently which I would continue until he can take po intake.  **HTN: labile BPs.    **Dementia: on home meds  **DM: DKA on admission, treated  with insulin gtt, per primary.   I don't have much to add and he's trending back to baseline renal function.  Will sign off.   Justin Mend 02/23/2020, 12:48 PM

## 2020-02-23 NOTE — Progress Notes (Signed)
Dr. Josephine Cables notified of lab results: Both sets of blood cultures show gram negative rods

## 2020-02-23 NOTE — Progress Notes (Signed)
Dr. Josephine Cables notified of need for transition orders from insulin infusion.

## 2020-02-23 NOTE — Progress Notes (Signed)
RN called for transition of insulin due to patient's anion gap closure and improved bicarb level, unfortunately, patient is currently not alert enough to tolerate any oral intake.  We shall continue with current D5 LR and insulin drip at this time with plan to eventually transition patient off to subcu insulin once patient is able to tolerate oral intake.

## 2020-02-23 NOTE — Progress Notes (Addendum)
Inpatient Diabetes Program Recommendations  AACE/ADA: New Consensus Statement on Inpatient Glycemic Control (2015)  Target Ranges:  Prepandial:   less than 140 mg/dL      Peak postprandial:   less than 180 mg/dL (1-2 hours)      Critically ill patients:  140 - 180 mg/dL   Lab Results  Component Value Date   GLUCAP 158 (H) 02/23/2020   HGBA1C 6.8 (H) 12/22/2017    Review of Glycemic Control  Diabetes history: DM 2 Outpatient Diabetes medications: Actos 15 mg Daily Current orders for Inpatient glycemic control: IV insulin/Endotool  Inpatient Diabetes Program Recommendations:    Consider transition from IV insulin even though pt is not eating well. Elevated renal function.  - Levemir 16 units Daily (0.2 units/kg) -  Novolog 0-6 units Q4 hours until PO intake increases  Transition orders placed as requested by Dr. Dwyane Dee.  Thanks,  Tama Headings RN, MSN, BC-ADM Inpatient Diabetes Coordinator Team Pager [phone] (8a-5p)

## 2020-02-23 NOTE — Progress Notes (Signed)
Jeannette Corpus NP updated on hypertension:  b/p 204/76 MAP 120. Labetolol 5mg  IV @2010 . Clonidine patch just applied. Hydralazine at 1635.

## 2020-02-23 NOTE — Progress Notes (Signed)
PROGRESS NOTE    Paul Perez  DDU:[id number] DOB: Aug 25, 1927 DOA: 02/22/2020 PCP: Alanson Puls [facility]   Brief Narrative: This 86 y.o. male with medical history significant of Dementia, Diabetes Mellitus II,  Hypertension, CKD stage IV, premature atrial contractions , history of GI bleed, presents in the emergency department with confusion and fever. This is patient's third ER visit  In last three weeks.  Last week he presented in the ED with generalized weakness and pain,  found to have a UTI . He was discharged on Keflex. Daughter reports he has been having worsening pain and weakness, was not able to stand up in the morning. Patient is admitted for sepsis secondary to Pseudomonas bacteremia.  He was also found to have high anion gap metabolic acidosis secondary to DKA which has been resolved. He is transitioned to subcu insulin.  Assessment & Plan: Active Problems:   Severe sepsis with acute organ dysfunction (HCC)  Severe sepsis secondary to Pseudomonas bacteremia: Patient presented with fever, tachycardia, tachypnea, lactic acid 5.3. He was treated for UTI with Keflex recently, continued to have fever. This could have been secondary to the UTI. Admitted to stepdown, patient received IV fluids in the ED. Post fluid resuscitation lactic acid 3.2.  we will continue IV fluids. Patient received vancomycin, cefepime and Flagyl in the ED.  Pharmacy consulted for dosing , MRSA nares negative.  Vancomycin discontinued Blood cultures grew gram-negative rods ( Pseudomonas) continue cefepime for now. Chest x-ray unremarkable, UA shows few WBCs otherwise nitrites, LE: negative  High anion gap metabolic acidosis secondary to DKA: >>> Resolved  Patient found to have a blood sugar of 802, high lactic acid,  high anion gap. Elevated Hydroxy butyrate 1.91. Started DKA protocol, Continue insulin drip as per protocol, Transitioned to subcu insulin once blood glucose below 250. Continue IV  fluids,  recheck BMP every 4 hours. Replace potassium as per protocol. Anion gap closed, blood sugar improved. Transitioned to Levemir 16 units daily in addition to NovoLog 0 to 6 units every 4 hours.  Hyponatremia:  >>> Resolved This could be due to dehydration. continue IV fluids. Sodium improved from 127 -> 138  Elevated liver enzymes: He is found to have elevated liver enzymes.   Noted to have some right lower quadrant tenderness. RUQ ultrasound shows cholelithiasis without any signs of acute cholecystitis.  AKI on CKD stage III: Baseline serum creatinine remains 2.4-2.6 Avoid nephrotoxic medication Nephrology consulted , recommended nothing to offer. Renal functions stable.  Hypertension:  Resume home blood pressure medication. Added hydralazine as needed for blood pressure control  Dementia: Continue  Aricept.  DVT prophylaxis: Lovenox Code Status: Full Family Communication: spoke with daughter caroline Disposition Plan:  Status is: Inpatient  Remains inpatient appropriate because:Inpatient level of care appropriate due to severity of illness   Dispo: The patient is from: Home              Anticipated d/c is to: SNF              Anticipated d/c date is: 3 days              Patient currently is not medically stable to d/c.   Consultants:   Nephrology  Procedures:  Antimicrobials:  Anti-infectives (From admission, onward)   Start     Dose/Rate Route Frequency Ordered Stop   02/24/20 1300  vancomycin (VANCOCIN) IVPB 1000 mg/200 mL premix        1,000 mg 200 mL/hr over 60 Minutes  Intravenous Every 48 hours 02/22/20 1331     02/23/20 1500  ceFEPIme (MAXIPIME) 2 g in sodium chloride 0.9 % 100 mL IVPB        2 g 200 mL/hr over 30 Minutes Intravenous Every 24 hours 02/22/20 1457     02/22/20 2000  metroNIDAZOLE (FLAGYL) IVPB 500 mg        500 mg 100 mL/hr over 60 Minutes Intravenous Every 8 hours 02/22/20 1549     02/22/20 1300  vancomycin (VANCOREADY)  IVPB 2000 mg/400 mL        2,000 mg 200 mL/hr over 120 Minutes Intravenous  Once 02/22/20 1219 02/22/20 1545   02/22/20 1215  ceFEPIme (MAXIPIME) 2 g in sodium chloride 0.9 % 100 mL IVPB        2 g 200 mL/hr over 30 Minutes Intravenous  Once 02/22/20 1207 02/22/20 1406   02/22/20 1215  metroNIDAZOLE (FLAGYL) IVPB 500 mg        500 mg 100 mL/hr over 60 Minutes Intravenous  Once 02/22/20 1207 02/22/20 1406   02/22/20 1215  vancomycin (VANCOCIN) IVPB 1000 mg/200 mL premix  Status:  Discontinued        1,000 mg 200 mL/hr over 60 Minutes Intravenous  Once 02/22/20 1207 02/22/20 1219     Subjective: Patient was seen and examined at bedside.  Overnight events noted.  He continued to have fever overnight.  Placed on cooling blankets.  Patient continues to have elevated blood pressure.  Patient is demented , He only answers in yes to all questions.  Objective: Vitals:   02/23/20 1100 02/23/20 1117 02/23/20 1200 02/23/20 1400  BP: (!) 155/89  (!) 187/69 (!) 217/94  Pulse: 87 67 71 94  Resp: 18 (!) 21 18 (!) 22  Temp:  99.8 F (37.7 C)    TempSrc:  Bladder    SpO2: 100% 100% 99% 97%  Weight:      Height:        Intake/Output Summary (Last 24 hours) at 02/23/2020 1548 Last data filed at 02/23/2020 0630 Gross per 24 hour  Intake 3748.5 ml  Output 1200 ml  Net 2548.5 ml   Filed Weights   02/22/20 1147 02/22/20 2345  Weight: 90.7 kg 81.1 kg    Examination:  General exam: Appears calm and comfortable.  Respiratory system: Clear to auscultation. Respiratory effort normal. Cardiovascular system: S1 & S2 heard, RRR. No JVD, murmurs, rubs, gallops or clicks. No pedal edema. Gastrointestinal system: Abdomen is nondistended, soft and nontender. No organomegaly or masses felt. Normal bowel sounds heard. Central nervous system: Alert and oriented. No focal neurological deficits. Extremities: No edema, no cyanosis, no clubbing  Skin: No rashes, lesions or ulcers Psychiatry: . Mood & affect  appropriate.     Data Reviewed: I have personally reviewed following labs and imaging studies  CBC: Recent Labs  Lab 02/22/20 1220 02/23/20 0403  WBC 8.1 6.2  NEUTROABS 7.3  --   HGB 11.0* 9.7*  HCT 34.5* 29.5*  MCV 86.5 84.3  PLT 203 983*   Basic Metabolic Panel: Recent Labs  Lab 02/22/20 1220 02/22/20 1735 02/22/20 2350 02/23/20 0403  NA 127* 136 138 136  K 5.1 4.2 4.3 4.1  CL 91* 104 105 107  CO2 19* 17* 19* 21*  GLUCOSE 802* 332* 91 174*  BUN 62* 60* 62* 64*  CREATININE 3.79* 3.57* 3.39* 3.52*  CALCIUM 9.1 8.6* 8.7* 8.1*   GFR: Estimated Creatinine Clearance: 15.3 mL/min (A) (by C-G formula based on  SCr of 3.52 mg/dL (H)). Liver Function Tests: Recent Labs  Lab 02/22/20 1220 02/23/20 0403  AST 154* 114*  ALT 55* 43  ALKPHOS 79 48  BILITOT 0.9 0.6  PROT 7.1 5.4*  ALBUMIN 3.5 2.5*   No results for input(s): LIPASE, AMYLASE in the last 168 hours. No results for input(s): AMMONIA in the last 168 hours. Coagulation Profile: Recent Labs  Lab 02/22/20 1220 02/23/20 0403  INR 1.3* 1.3*   Cardiac Enzymes: No results for input(s): CKTOTAL, CKMB, CKMBINDEX, TROPONINI in the last 168 hours. BNP (last 3 results) No results for input(s): PROBNP in the last 8760 hours. HbA1C: No results for input(s): HGBA1C in the last 72 hours. CBG: Recent Labs  Lab 02/23/20 0816 02/23/20 1035 02/23/20 1120 02/23/20 1323 02/23/20 1507  GLUCAP 158* 174* 166* 154* 158*   Lipid Profile: No results for input(s): CHOL, HDL, LDLCALC, TRIG, CHOLHDL, LDLDIRECT in the last 72 hours. Thyroid Function Tests: No results for input(s): TSH, T4TOTAL, FREET4, T3FREE, THYROIDAB in the last 72 hours. Anemia Panel: No results for input(s): VITAMINB12, FOLATE, FERRITIN, TIBC, IRON, RETICCTPCT in the last 72 hours. Sepsis Labs: Recent Labs  Lab 02/22/20 1220 02/22/20 1343 02/23/20 0403  PROCALCITON  --   --  >150.00  LATICACIDVEN 5.1* 3.5*  --     Recent Results (from the  past 240 hour(s))  Urine Culture     Status: Abnormal   Collection Time: 02/14/20  9:41 AM   Specimen: Urine, Random  Result Value Ref Range Status   Specimen Description   Final    URINE, RANDOM Performed at [facility], [address] [place], [place] [address]    Special Requests   Final    NONE Performed at [facility], [address] [place],  [address]    Culture >=100,000 COLONIES/mL ESCHERICHIA COLI (A)  Final   Report Status 02/16/2020 FINAL  Final   Organism ID, Bacteria ESCHERICHIA COLI (A)  Final      Susceptibility   Escherichia coli - MIC*    AMPICILLIN <=2 SENSITIVE Sensitive     CEFAZOLIN <=4 SENSITIVE Sensitive     CEFTRIAXONE <=0.25 SENSITIVE Sensitive     CIPROFLOXACIN <=0.25 SENSITIVE Sensitive     GENTAMICIN <=1 SENSITIVE Sensitive     IMIPENEM <=0.25 SENSITIVE Sensitive     NITROFURANTOIN <=16 SENSITIVE Sensitive     TRIMETH/SULFA <=20 SENSITIVE Sensitive     AMPICILLIN/SULBACTAM <=2 SENSITIVE Sensitive     PIP/TAZO <=4 SENSITIVE Sensitive     * >=100,000 COLONIES/mL ESCHERICHIA COLI  Respiratory Panel by RT PCR (Flu A&B, Covid) - Nasopharyngeal Swab     Status: None   Collection Time: 02/22/20 12:10 PM   Specimen: Nasopharyngeal Swab  Result Value Ref Range Status   SARS Coronavirus 2 by RT PCR NEGATIVE NEGATIVE Final    Comment: (NOTE) SARS-CoV-2 target nucleic acids are NOT DETECTED.  The SARS-CoV-2 RNA is generally detectable in upper respiratoy specimens during the acute phase of infection. The lowest concentration of SARS-CoV-2 viral copies this assay can detect is 131 copies/mL. A negative result does not preclude SARS-Cov-2 infection and should not be used as the sole basis for treatment or other patient management decisions. A negative result may occur with  improper specimen collection/handling, submission of specimen other than nasopharyngeal swab, presence of viral mutation(s) within the areas targeted by this assay, and  inadequate number of viral copies (<131 copies/mL). A negative result must be combined with clinical observations, patient history, and epidemiological  information. The expected result is Negative.  Fact Sheet for Patients:  PinkCheek.be  Fact Sheet for Healthcare Providers:  GravelBags.it  This test is no t yet approved or cleared by the [place] FDA and  has been authorized for detection and/or diagnosis of SARS-CoV-2 by FDA under an Emergency Use Authorization (EUA). This EUA will remain  in effect (meaning this test can be used) for the duration of the COVID-19 declaration under Section 564(b)(1) of the Act, 21 U.S.C. section 360bbb-3(b)(1), unless the authorization is terminated or revoked sooner.     Influenza A by PCR NEGATIVE NEGATIVE Final   Influenza B by PCR NEGATIVE NEGATIVE Final    Comment: (NOTE) The Xpert Xpress SARS-CoV-2/FLU/RSV assay is intended as an aid in  the diagnosis of influenza from Nasopharyngeal swab specimens and  should not be used as a sole basis for treatment. Nasal washings and  aspirates are unacceptable for Xpert Xpress SARS-CoV-2/FLU/RSV  testing.  Fact Sheet for Patients: PinkCheek.be  Fact Sheet for Healthcare Providers: GravelBags.it  This test is not yet approved or cleared by the [place] FDA and  has been authorized for detection and/or diagnosis of SARS-CoV-2 by  FDA under an Emergency Use Authorization (EUA). This EUA will remain  in effect (meaning this test can be used) for the duration of the  Covid-19 declaration under Section 564(b)(1) of the Act, 21  U.S.C. section 360bbb-3(b)(1), unless the authorization is  terminated or revoked. Performed at [facility], [address] [place], [place] [address]   Blood Culture (routine x 2)     Status: None (Preliminary result)   Collection Time: 02/22/20 12:20  PM   Specimen: BLOOD LEFT HAND  Result Value Ref Range Status   Specimen Description   Final    BLOOD LEFT HAND BOTTLES DRAWN AEROBIC AND ANAEROBIC Performed at [facility], [address] [place], [place] [address]    Special Requests   Final    Blood Culture adequate volume Performed at [facility], [address] [place] [address] [place], [place] [address]    Culture  Setup Time   Final    GRAM NEGATIVE RODS Gram Stain Report Called to,Read Back By and Verified With: DANIELS,J @ [place] ON 02/23/20 BY JUW AEROBIC BOTTLE ONLY GS DONE @ [facility] Performed at [facility], [address] [place], [place] [address]    Culture GRAM NEGATIVE RODS  Final   Report Status PENDING  Incomplete  Blood Culture (routine x 2)     Status: None (Preliminary result)   Collection Time: 02/22/20 12:20 PM   Specimen: Right Antecubital; Blood  Result Value Ref Range Status   Specimen Description   Final    RIGHT ANTECUBITAL BOTTLES DRAWN AEROBIC AND ANAEROBIC Performed at [facility], [address] [place], [place] [address]    Special Requests   Final    Blood Culture adequate volume Performed at [facility], [address], [place], [place] [address]    Culture  Setup Time   Final    GRAM NEGATIVE RODS Gram Stain Report Called to,Read Back By and Verified With: DANIELS,J @ 9628 ON 02/23/20 BY JUW AEROBIC BOTTLE ONLY GS DONE @ [facility] Organism ID to follow Performed at [facility], [address], [place],  [address]    Culture GRAM NEGATIVE RODS  Final   Report Status PENDING  Incomplete  Blood Culture ID Panel (Reflexed)     Status: Abnormal   Collection Time: 02/22/20 12:20 PM  Result Value  Ref Range Status   Enterococcus faecalis NOT DETECTED NOT DETECTED Final   Enterococcus Faecium NOT DETECTED NOT DETECTED Final   Listeria monocytogenes NOT DETECTED NOT DETECTED Final   Staphylococcus species NOT DETECTED NOT DETECTED Final   Staphylococcus aureus (BCID) NOT DETECTED NOT DETECTED Final    Staphylococcus epidermidis NOT DETECTED NOT DETECTED Final   Staphylococcus lugdunensis NOT DETECTED NOT DETECTED Final   Streptococcus species NOT DETECTED NOT DETECTED Final   Streptococcus agalactiae NOT DETECTED NOT DETECTED Final   Streptococcus pneumoniae NOT DETECTED NOT DETECTED Final   Streptococcus pyogenes NOT DETECTED NOT DETECTED Final   A.calcoaceticus-baumannii NOT DETECTED NOT DETECTED Final   Bacteroides fragilis NOT DETECTED NOT DETECTED Final   Enterobacterales NOT DETECTED NOT DETECTED Final   Enterobacter cloacae complex NOT DETECTED NOT DETECTED Final   Escherichia coli NOT DETECTED NOT DETECTED Final   Klebsiella aerogenes NOT DETECTED NOT DETECTED Final   Klebsiella oxytoca NOT DETECTED NOT DETECTED Final   Klebsiella pneumoniae NOT DETECTED NOT DETECTED Final   Proteus species NOT DETECTED NOT DETECTED Final   Salmonella species NOT DETECTED NOT DETECTED Final   Serratia marcescens NOT DETECTED NOT DETECTED Final   Haemophilus influenzae NOT DETECTED NOT DETECTED Final   Neisseria meningitidis NOT DETECTED NOT DETECTED Final   Pseudomonas aeruginosa DETECTED (A) NOT DETECTED Final    Comment: CRITICAL RESULT CALLED TO, READ BACK BY AND VERIFIED WITH: Hyman Hopes RN, AT 1245 02/23/20 BY D. VANHOOK    Stenotrophomonas maltophilia NOT DETECTED NOT DETECTED Final   Candida albicans NOT DETECTED NOT DETECTED Final   Candida auris NOT DETECTED NOT DETECTED Final   Candida glabrata NOT DETECTED NOT DETECTED Final   Candida krusei NOT DETECTED NOT DETECTED Final   Candida parapsilosis NOT DETECTED NOT DETECTED Final   Candida tropicalis NOT DETECTED NOT DETECTED Final   Cryptococcus neoformans/gattii NOT DETECTED NOT DETECTED Final   CTX-M ESBL NOT DETECTED NOT DETECTED Final   Carbapenem resistance IMP NOT DETECTED NOT DETECTED Final   Carbapenem resistance KPC NOT DETECTED NOT DETECTED Final   Carbapenem resistance NDM NOT DETECTED NOT DETECTED Final    Carbapenem resistance VIM NOT DETECTED NOT DETECTED Final    Comment: Performed at [facility], [address] [place], [place] [address]  MRSA PCR Screening     Status: None   Collection Time: 02/22/20 12:28 PM   Specimen: Nasopharyngeal  Result Value Ref Range Status   MRSA by PCR NEGATIVE NEGATIVE Final    Comment:        The GeneXpert MRSA Assay (FDA approved for NASAL specimens only), is one component of a comprehensive MRSA colonization surveillance program. It is not intended to diagnose MRSA infection nor to guide or monitor treatment for MRSA infections. Performed at [facility], [address] [place], [place] [address]          Radiology Studies: [facility] Chest [facility] 1 View  Result Date: 02/22/2020 CLINICAL DATA:  Sepsis EXAM: PORTABLE CHEST 1 VIEW COMPARISON:  February 14, 2020 FINDINGS: The heart size and mediastinal contours are within normal limits. Both lungs are clear. The visualized skeletal structures are unremarkable. IMPRESSION: No active disease. Electronically Signed   By: Abelardo Diesel M.D.   On: 02/22/2020 14:08   US Abdomen Limited RUQ  Result Date: 02/23/2020 CLINICAL DATA:  Elevated liver enzymes EXAM: ULTRASOUND ABDOMEN LIMITED RIGHT UPPER QUADRANT COMPARISON:  August 25, 2017 FINDINGS: Gallbladder: No wall thickening visualized. Gallstones are identified. 0.3 cm polyp is  identified. No sonographic Murphy sign noted by sonographer. Common bile duct: Diameter: 0.3 mm Liver: 1.5 x 1.1 x 1.4 cm cyst is identified in the left lobe liver. Within normal limits in parenchymal echogenicity. Portal vein is patent on color Doppler imaging with normal direction of blood flow towards the liver. Other: None. IMPRESSION: 1. Cholelithiasis without sonographic evidence of acute cholecystitis. 2. 1.5 cm cyst in the left lobe liver. Electronically Signed   By: Abelardo Diesel M.D.   On: 02/23/2020 14:49        Scheduled Meds: . busPIRone  5 mg Oral BID  .  Chlorhexidine Gluconate Cloth  6 each Topical Daily  . cloNIDine  0.1 mg Oral BID  . docusate sodium  100 mg Oral BID  . donepezil  5 mg Oral QHS  . enoxaparin (LOVENOX) injection  30 mg Subcutaneous Q24H  . furosemide  40 mg Oral Daily  . hydrALAZINE  50 mg Oral TID  . insulin aspart  0-6 Units Subcutaneous Q4H  . insulin detemir  16 Units Subcutaneous Daily  . mouth rinse  15 mL Mouth Rinse BID  . senna  1 tablet Oral BID   Continuous Infusions: . ceFEPime (MAXIPIME) IV 2 g (02/23/20 1514)  . dextrose 5% lactated ringers 125 mL/hr at 02/23/20 0600  . lactated ringers    . lactated ringers Stopped (02/22/20 1848)  . metronidazole 500 mg (02/23/20 1127)  . [START ON 02/24/2020] vancomycin       LOS: 1 day    Time spent: 35 mins.    Shawna Clamp, MD [facility]   If 7PM-7AM, please contact night-coverage

## 2020-02-24 DIAGNOSIS — I4891 Unspecified atrial fibrillation: Secondary | ICD-10-CM

## 2020-02-24 DIAGNOSIS — R7881 Bacteremia: Secondary | ICD-10-CM | POA: Diagnosis not present

## 2020-02-24 DIAGNOSIS — A419 Sepsis, unspecified organism: Secondary | ICD-10-CM | POA: Diagnosis not present

## 2020-02-24 DIAGNOSIS — F0390 Unspecified dementia without behavioral disturbance: Secondary | ICD-10-CM | POA: Diagnosis not present

## 2020-02-24 DIAGNOSIS — I10 Essential (primary) hypertension: Secondary | ICD-10-CM

## 2020-02-24 DIAGNOSIS — N183 Chronic kidney disease, stage 3 unspecified: Secondary | ICD-10-CM

## 2020-02-24 DIAGNOSIS — R6520 Severe sepsis without septic shock: Secondary | ICD-10-CM | POA: Diagnosis not present

## 2020-02-24 DIAGNOSIS — B965 Pseudomonas (aeruginosa) (mallei) (pseudomallei) as the cause of diseases classified elsewhere: Secondary | ICD-10-CM | POA: Diagnosis not present

## 2020-02-24 LAB — CBC
HCT: 30.3 % — ABNORMAL LOW (ref 39.0–52.0)
Hemoglobin: 9.6 g/dL — ABNORMAL LOW (ref 13.0–17.0)
MCH: 26.6 pg (ref 26.0–34.0)
MCHC: 31.7 g/dL (ref 30.0–36.0)
MCV: 83.9 fL (ref 80.0–100.0)
Platelets: 171 10*3/uL (ref 150–400)
RBC: 3.61 MIL/uL — ABNORMAL LOW (ref 4.22–5.81)
RDW: 13.1 % (ref 11.5–15.5)
WBC: 6.3 10*3/uL (ref 4.0–10.5)
nRBC: 0.3 % — ABNORMAL HIGH (ref 0.0–0.2)

## 2020-02-24 LAB — GLUCOSE, CAPILLARY
Glucose-Capillary: 191 mg/dL — ABNORMAL HIGH (ref 70–99)
Glucose-Capillary: 192 mg/dL — ABNORMAL HIGH (ref 70–99)
Glucose-Capillary: 196 mg/dL — ABNORMAL HIGH (ref 70–99)
Glucose-Capillary: 249 mg/dL — ABNORMAL HIGH (ref 70–99)
Glucose-Capillary: 250 mg/dL — ABNORMAL HIGH (ref 70–99)
Glucose-Capillary: 253 mg/dL — ABNORMAL HIGH (ref 70–99)
Glucose-Capillary: 265 mg/dL — ABNORMAL HIGH (ref 70–99)

## 2020-02-24 LAB — PHOSPHORUS: Phosphorus: 2.7 mg/dL (ref 2.5–4.6)

## 2020-02-24 LAB — COMPREHENSIVE METABOLIC PANEL
ALT: 40 U/L (ref 0–44)
AST: 61 U/L — ABNORMAL HIGH (ref 15–41)
Albumin: 2.2 g/dL — ABNORMAL LOW (ref 3.5–5.0)
Alkaline Phosphatase: 47 U/L (ref 38–126)
Anion gap: 9 (ref 5–15)
BUN: 65 mg/dL — ABNORMAL HIGH (ref 8–23)
CO2: 22 mmol/L (ref 22–32)
Calcium: 8.7 mg/dL — ABNORMAL LOW (ref 8.9–10.3)
Chloride: 112 mmol/L — ABNORMAL HIGH (ref 98–111)
Creatinine, Ser: 2.9 mg/dL — ABNORMAL HIGH (ref 0.61–1.24)
GFR, Estimated: 18 mL/min — ABNORMAL LOW (ref >60)
Glucose, Bld: 205 mg/dL — ABNORMAL HIGH (ref 70–99)
Potassium: 3.8 mmol/L (ref 3.5–5.1)
Sodium: 143 mmol/L (ref 135–145)
Total Bilirubin: 0.6 mg/dL (ref 0.3–1.2)
Total Protein: 5.5 g/dL — ABNORMAL LOW (ref 6.5–8.1)

## 2020-02-24 LAB — MAGNESIUM: Magnesium: 2.4 mg/dL (ref 1.7–2.4)

## 2020-02-24 LAB — LACTIC ACID, PLASMA
Lactic Acid, Venous: 2.1 mmol/L (ref 0.5–1.9)
Lactic Acid, Venous: 2.3 mmol/L (ref 0.5–1.9)

## 2020-02-24 LAB — HEMOGLOBIN A1C
Hgb A1c MFr Bld: >15.5 % — ABNORMAL HIGH (ref 4.8–5.6)
Mean Plasma Glucose: >398 mg/dL

## 2020-02-24 MED ORDER — METOPROLOL TARTRATE 5 MG/5ML IV SOLN
5.0000 mg | Freq: Four times a day (QID) | INTRAVENOUS | Status: DC
  Administered 2020-02-24 – 2020-02-25 (×3): 5 mg via INTRAVENOUS
  Filled 2020-02-24 (×3): qty 5

## 2020-02-24 MED ORDER — SODIUM CHLORIDE 0.9% FLUSH
3.0000 mL | Freq: Two times a day (BID) | INTRAVENOUS | Status: DC
  Administered 2020-02-24 – 2020-03-06 (×21): 3 mL via INTRAVENOUS

## 2020-02-24 MED ORDER — METOPROLOL TARTRATE 5 MG/5ML IV SOLN
INTRAVENOUS | Status: AC
  Administered 2020-02-24: 5 mg
  Filled 2020-02-24: qty 5

## 2020-02-24 NOTE — Consult Note (Addendum)
Cardiology Consultation:   Patient ID: Paul Perez MRN: [id number]; DOB: [birth year]  Admit date: 02/22/2020 Date of Consult: 02/24/2020  Primary Care Provider: [place] Cardiologist: Rozann Lesches, MD   [place] Electrophysiologist:  None   Patient Profile:   Paul Perez is a 86 y.o. male with a hx of dementia, sepsis who is being seen today for the evaluation of Atrial fib at the request of Dr. Dwyane Dee.  History of Present Illness:   Paul Perez is a 86 year old male patient with history of bradycardia in the setting of rhabdomyolysis in 2015, dementia, hypertension, history of upper GI bleed.  Patient was found down in the yard by his brother 08/25/2017 where he had laid all night long.  He had been mowing with a riding mower and the mower was found in the woods.  CKs were 22,000 and troponins 9.3.  Core body temperature was 94.6.It was not clear if he had syncope outpatient monitor never done.  Echo at that time normal LVEF 60 to 65% with grade 1 DD.  Patient now admitted with sepsis secondary to Pseudomonas with associated DKA, AKI all resolving.  Patient was having swallowing evaluation done this morning and went into A. fib with RVR and converted to normal sinus rhythm with IV lopressor 5 mg x1. Patient can give no history with dementia   Past Medical History:  Diagnosis Date  . Dementia ([place])   . Diabetes mellitus without complication ([place])   . Encephalopathy   . Gastritis 2004  . GIB (gastrointestinal bleeding) 2004  . Hypertension   . Poor historian   . Sinus bradycardia seen on cardiac monitor     Past Surgical History:  Procedure Laterality Date  . INGUINAL HERNIA REPAIR  09/2010   Bilateral  . UPPER GASTROINTESTINAL ENDOSCOPY  2004     Home Medications:  Prior to Admission medications   Medication Sig Start Date End Date Taking? Authorizing Provider  Balsam [place] Oil [facility]) OINT Apply liberal amount to  coccyx, sacrum and bilateral buttocks q shift and prn after incontinence episodes for skin protection    [provider]  busPIRone (BUSPAR) 5 MG tablet Take 5 mg by mouth 2 (two) times daily.    [provider]  cephALEXin (KEFLEX) 500 MG capsule Take 1 capsule (500 mg total) by mouth 4 (four) times daily. 02/14/20   Milton Ferguson, MD  cloNIDine (CATAPRES) 0.1 MG tablet Take 0.1 mg by mouth 2 (two) times daily.    [provider]  donepezil (ARICEPT) 5 MG tablet Take 5 mg by mouth at bedtime.    [provider]  furosemide (LASIX) 40 MG tablet Take 40 mg by mouth.    [provider]  hydrALAZINE (APRESOLINE) 25 MG tablet Take 50 mg by mouth 3 (three) times daily.     [provider]  iron polysaccharides (NIFEREX) 150 MG capsule Take 150 mg by mouth daily.    [provider]  pioglitazone (ACTOS) 15 MG tablet Take 1 tablet by mouth daily. 12/01/16   [provider]  polyethylene glycol (MIRALAX / GLYCOLAX) packet Take 17 g by mouth daily as needed for mild constipation. 08/30/17   Barton Dubois, MD  temazepam (RESTORIL) 7.5 MG capsule Take 1 capsule (7.5 mg total) by mouth at bedtime. 12/11/17   Granville Lewis C, PA-C  Vitamin D, Cholecalciferol, 1000 units TABS Take 1 tablet by mouth once a day    [provider]  Inpatient Medications: Scheduled Meds: . busPIRone  5 mg Oral BID  . Chlorhexidine Gluconate Cloth  6 each Topical Daily  . cloNIDine  0.1 mg Transdermal Weekly  . docusate sodium  100 mg Oral BID  . donepezil  5 mg Oral QHS  . enoxaparin (LOVENOX) injection  30 mg Subcutaneous Q24H  . furosemide  40 mg Oral Daily  . hydrALAZINE  50 mg Oral TID  . insulin aspart  0-6 Units Subcutaneous Q4H  . insulin detemir  16 Units Subcutaneous Daily  . mouth rinse  15 mL Mouth Rinse BID  . senna  1 tablet Oral BID  . sodium chloride flush  3 mL Intravenous Q12H   Continuous Infusions: . ceFEPime (MAXIPIME) IV  2 g (02/24/20 1513)  . dextrose 5% lactated ringers 125 mL/hr at 02/24/20 1513  . lactated ringers    . lactated ringers Stopped (02/22/20 1848)   PRN Meds: acetaminophen **OR** acetaminophen, dextrose, hydrALAZINE, labetalol, ondansetron **OR** ondansetron (ZOFRAN) IV  Allergies:   No Known Allergies  Social History:   Social History   Socioeconomic History  . Marital status: Widowed    Spouse name: Not on file  . Number of children: Not on file  . Years of education: Not on file  . Highest education level: Not on file  Occupational History  . Not on file  Tobacco Use  . Smoking status: Never Smoker  . Smokeless tobacco: Never Used  Vaping Use  . Vaping Use: Never used  Substance and Sexual Activity  . Alcohol use: No  . Drug use: No  . Sexual activity: Not on file  Other Topics Concern  . Not on file  Social History Narrative  . Not on file   Social Determinants of Health   Financial Resource Strain:   . Difficulty of Paying Living Expenses: Not on file  Food Insecurity:   . Worried About Charity fundraiser in the Last Year: Not on file  . Ran Out of Food in the Last Year: Not on file  Transportation Needs:   . Lack of Transportation (Medical): Not on file  . Lack of Transportation (Non-Medical): Not on file  Physical Activity:   . Days of Exercise per Week: Not on file  . Minutes of Exercise per Session: Not on file  Stress:   . Feeling of Stress : Not on file  Social Connections:   . Frequency of Communication with Friends and Family: Not on file  . Frequency of Social Gatherings with Friends and Family: Not on file  . Attends Religious Services: Not on file  . Active Member of Clubs or Organizations: Not on file  . Attends Archivist Meetings: Not on file  . Marital Status: Not on file  Intimate Partner Violence:   . Fear of Current or Ex-Partner: Not on file  . Emotionally Abused: Not on file  . Physically Abused: Not on file  . Sexually  Abused: Not on file    Family History:    Family History  Problem Relation Age of Onset  . Diabetes Mother   . Diabetes Sister      ROS:  Please see the history of present illness.  Review of Systems  Reason unable to perform ROS: dementia.    All other ROS reviewed and negative.     Physical Exam/Data:   Vitals:   02/24/20 1200 02/24/20 1300 02/24/20 1400 02/24/20 1500  BP: (!) 153/58 (!) 164/63 (!) 147/67 (!) 185/68  Pulse:  (!) 38 (!) 37 (!) 48  Resp: 14 20 17  (!) 21  Temp:      TempSrc:      SpO2:  99% 100% 90%  Weight:      Height:        Intake/Output Summary (Last 24 hours) at 02/24/2020 1559 Last data filed at 02/24/2020 1513 Gross per 24 hour  Intake 3997.3 ml  Output 1500 ml  Net 2497.3 ml   Last 3 Weights 02/24/2020 02/22/2020 02/22/2020  Weight (lbs) 187 lb 2.7 oz 178 lb 12.7 oz 199 lb 15.3 oz  Weight (kg) 84.9 kg 81.1 kg 90.7 kg     Body mass index is 25.38 kg/m.  General:  Thin, elderly, in no acute distress  HEENT: normal Lymph: no adenopathy Neck: no JVD Endocrine:  No thryomegaly Vascular: No carotid bruits; FA pulses 2+ bilaterally without bruits  Cardiac:  normal S1, S2; RRR; no murmur  Lungs:  clear to auscultation bilaterally, no wheezing, rhonchi or rales  Abd: soft, nontender, no hepatomegaly  Ext: no edema Musculoskeletal:  No deformities, BUE and BLE strength normal and equal Skin: warm and dry  Neuro:  dementia Psych:  Normal affect   EKG:  The EKG was personally reviewed and demonstrates:  NSR with LVH and reciprocal changes no acute change. EKG on the floor shows Afib with RVR Telemetry:  Telemetry was personally reviewed and demonstrates:  NSR-Afib-NSR Relevant CV Studies:   2D echo 08/26/2017 Study Conclusions   - Left ventricle: The cavity size was normal. There was mild   concentric hypertrophy with moderate hypertrophy of the basal   septum. Systolic function was normal. The estimated ejection   fraction was in the  range of 60% to 65%. Wall motion was normal;   there were no regional wall motion abnormalities. Doppler   parameters are consistent with abnormal left ventricular   relaxation (grade 1 diastolic dysfunction). Doppler parameters   are consistent with indeterminate ventricular filling pressure. - Aortic valve: Transvalvular velocity was within the normal range.   There was no stenosis. There was no regurgitation. - Mitral valve: Transvalvular velocity was within the normal range.   There was no evidence for stenosis. There was mild regurgitation. - Right ventricle: The cavity size was normal. Wall thickness was   normal. Systolic function was normal. - Atrial septum: No defect or patent foramen ovale was identified   by color flow Doppler. - Tricuspid valve: There was trivial regurgitation. - Pulmonary arteries: Systolic pressure was within the normal   range.  Laboratory Data:  High Sensitivity Troponin:  No results for input(s): TROPONINIHS in the last 720 hours.   Chemistry Recent Labs  Lab 02/22/20 2350 02/23/20 0403 02/24/20 0444  NA 138 136 143  K 4.3 4.1 3.8  CL 105 107 112*  CO2 19* 21* 22  GLUCOSE 91 174* 205*  BUN 62* 64* 65*  CREATININE 3.39* 3.52* 2.90*  CALCIUM 8.7* 8.1* 8.7*  GFRNONAA 15* 14* 18*  ANIONGAP 14 8 9     Recent Labs  Lab 02/22/20 1220 02/23/20 0403 02/24/20 0444  PROT 7.1 5.4* 5.5*  ALBUMIN 3.5 2.5* 2.2*  AST 154* 114* 61*  ALT 55* 43 40  ALKPHOS 79 48 47  BILITOT 0.9 0.6 0.6   Hematology Recent Labs  Lab 02/22/20 1220 02/23/20 0403 02/24/20 0444  WBC 8.1 6.2 6.3  RBC 3.99* 3.50* 3.61*  HGB 11.0* 9.7* 9.6*  HCT 34.5* 29.5* 30.3*  MCV 86.5 84.3 83.9  MCH 27.6 27.7 26.6  MCHC 31.9 32.9 31.7  RDW 13.0 13.1 13.1  PLT 203 148* 171   BNPNo results for input(s): BNP, PROBNP in the last 168 hours.  DDimer No results for input(s): DDIMER in the last 168 hours.   Radiology/Studies:  DG Chest Port 1 View  Result Date:  02/22/2020 CLINICAL DATA:  Sepsis EXAM: PORTABLE CHEST 1 VIEW COMPARISON:  February 14, 2020 FINDINGS: The heart size and mediastinal contours are within normal limits. Both lungs are clear. The visualized skeletal structures are unremarkable. IMPRESSION: No active disease. Electronically Signed   By: Abelardo Diesel M.D.   On: 02/22/2020 14:08   US Abdomen Limited RUQ  Result Date: 02/23/2020 CLINICAL DATA:  Elevated liver enzymes EXAM: ULTRASOUND ABDOMEN LIMITED RIGHT UPPER QUADRANT COMPARISON:  August 25, 2017 FINDINGS: Gallbladder: No wall thickening visualized. Gallstones are identified. 0.3 cm polyp is identified. No sonographic Murphy sign noted by sonographer. Common bile duct: Diameter: 0.3 mm Liver: 1.5 x 1.1 x 1.4 cm cyst is identified in the left lobe liver. Within normal limits in parenchymal echogenicity. Portal vein is patent on color Doppler imaging with normal direction of blood flow towards the liver. Other: None. IMPRESSION: 1. Cholelithiasis without sonographic evidence of acute cholecystitis. 2. 1.5 cm cyst in the left lobe liver. Electronically Signed   By: Abelardo Diesel M.D.   On: 02/23/2020 14:49     Assessment and Plan:   Atrial fibrillation with RVR during swallowing evaluation today converted to NSR after IV metoprolol 5 mg. History of bradycardia in the setting of rhabdomyolysis in the past but none documented here. Currently not taking oral meds, so can use prn IV metoprolol or IV diltiazem if he has recurrence. CHADSVASC=4, need to consider bleeding/fall risk in this 86 yo with dementia before comitting to anticoagulation  Sepsis secondary to Pseudomonas bacteremia with acute organ dysfunction  Dementia  DKA resolved blood sugar 802 high lactic acid and high anion gap  AKI on CKD stage III Crt 2.90 today  Hypertension BP trending high.             CHA2DS2-VASc Score = 4  This indicates a 4.8% annual risk of stroke. The patient's score is based upon: CHF  History: 0 HTN History: 1 Diabetes History: 1 Stroke History: 0 Vascular Disease History: 0 Age Score: 2 Gender Score: 0         For questions or updates, please contact [place] Please consult [email or web address] for contact info under    Signed, Ermalinda Barrios, PA-C  02/24/2020 3:59 PM   Patient seen and examined  I agree with findings as noted above by  Pt is a 86 yo with a hx of dementiawho was admitted today after being found down    Being treated for sepsis   Today developed atrial fibrillation  Transient    Currently in SR     On exam, pt is in NAD NEck:  No JVD Lungs are CTA Cardiac shows RRR  No S3 No signif murmurs Abd is benign Ext are with triv edema  Pt currently not taking po food   I would continue IV lopressor intermitt for HR and BP control  With dementia and presentation (found down/rhabdo) I do not think pt is a candidate for anticoagulation Will continue to follow   Dorris Carnes MD

## 2020-02-24 NOTE — TOC Initial Note (Signed)
Transition of Care [facility]) - Initial/Assessment Note    Patient Details  Name: Paul Perez MRN: [id number] Date of Birth: [birth year]  [facility] CM/SW Contact:    Natasha Bence, LCSW Phone Number: 02/24/2020, 11:42 AM  Clinical Narrative:                 Patient is an 86 year old male admitted for severe sepsis with acute organ dysfunction. CSW identified orange readmission risk. CSW conducted an assessment for patient. Patient uses walker at baseline. Patient's son reported that family would be agreeable to [facility] or SNF it recommended. Son expressed interest in [place] and reported that patient currently lives at [facility]. PT eval pending. TOC to follow.  Expected Discharge Plan: Skilled Nursing Facility Barriers to Discharge: Continued Medical Work up   Patient Goals and CMS Choice     Choice offered to / list presented to : Patient, Adult Children  Expected Discharge Plan and Services Expected Discharge Plan: [place]       Living arrangements for the past 2 months: [place]                                      Prior Living Arrangements/Services Living arrangements for the past 2 months: [place] Lives with:: Self Patient language and need for interpreter reviewed:: Yes        Need for Family Participation in Patient Care: No (Comment) Care giver support system in place?: No (comment)   Criminal Activity/Legal Involvement Pertinent to Current Situation/Hospitalization: Yes - Comment as needed  Activities of Daily Living Home Assistive Devices/Equipment:  (unknown) ADL Screening (condition at time of admission) Patient's cognitive ability adequate to safely complete daily activities?: No Is the patient deaf or have difficulty hearing?: No Does the patient have difficulty seeing, even when wearing glasses/contacts?:  (unknown) Does the patient have difficulty concentrating,  remembering, or making decisions?: Yes Patient able to express need for assistance with ADLs?: No Does the patient have difficulty dressing or bathing?: Yes Independently performs ADLs?: No Communication: Needs assistance Is this a change from baseline?:  (unknown) Dressing (OT): Dependent Is this a change from baseline?:  (unknown) Grooming: Dependent Is this a change from baseline?:  (unknown) Feeding: Needs assistance Is this a change from baseline?:  (unknown) Bathing: Dependent Is this a change from baseline?:  (unknown) Toileting: Dependent Is this a change from baseline?:  (unknown) In/Out Bed: Dependent Is this a change from baseline?:  (unknown) Walks in Home:  (unknown) Does the patient have difficulty walking or climbing stairs?:  (unknown) Weakness of Legs: Both Weakness of Arms/Hands: Both  Permission Sought/Granted   Permission granted to share information with : Yes, Verbal Permission Granted  Share Information with NAME: Everlene Farrier  Permission granted to share info w AGENCY: Local SNF's  Permission granted to share info w Relationship: son  Permission granted to share info w Contact Information: [phone]  Emotional Assessment Appearance:: Appears stated age     Orientation: : Oriented to Self Alcohol / Substance Use: Not Applicable Psych Involvement: No (comment)  Admission diagnosis:  Elevated liver enzymes [R74.8] Severe sepsis with acute organ dysfunction (HCC) [A41.9, R65.20] Acute sepsis [facility]) [A41.9] Patient Active Problem List   Diagnosis Date Noted  . Severe sepsis with acute organ dysfunction ([place]) 02/22/2020  . CKD (chronic kidney disease) stage 4, GFR 15-29  ml/min ([place]) 12/21/2017  . Anemia 12/21/2017  . Dementia ([place]) 09/28/2017  . Leg edema 09/28/2017  . Acute on chronic diastolic CHF (congestive heart failure) ([place]) 09/27/2017  . Acute renal failure superimposed on stage 3 chronic kidney disease ([place])   . Elevated troponin  08/25/2017  . Hyperlipidemia 08/25/2017  . Diabetes mellitus type 2, uncontrolled, with complications ([place]) 03/70/4888  . Bradycardia 08/28/2013  . PAC (premature atrial contraction) 08/28/2013  . Altered mental state 08/20/2013  . Acute encephalopathy 08/20/2013  . Hypertension 08/20/2013  . Rhabdomyolysis 08/20/2013  . UTI (lower urinary tract infection) 08/20/2013   PCP:  Alanson Puls [facility] [place]:   [place], [address] [place] [address] Phone: [phone] Fax: [phone]     Social Determinants of Health (SDOH) Interventions    Readmission Risk Interventions No flowsheet data found.

## 2020-02-24 NOTE — Progress Notes (Signed)
PROGRESS NOTE    Paul Perez  KVQ:[id number] DOB: 06-24-1927 DOA: 02/22/2020 PCP: Alanson Puls [facility]   Brief Narrative: This 86 y.o. male with medical history significant of Dementia, Diabetes Mellitus II,  Hypertension, CKD stage IV, premature atrial contractions , history of GI bleed, presents in the emergency department with confusion and fever. This is patient's third ER visit  In last three weeks.  Last week he presented in the ED with generalized weakness and pain,  found to have a UTI . He was discharged on Keflex. Daughter reports he has been having worsening pain and weakness, was not able to stand up in the morning. Patient is admitted for sepsis secondary to Pseudomonas bacteremia.  He was also found to have high anion gap metabolic acidosis secondary to DKA which has been resolved. He is transitioned to subcu insulin. He has developed paroxysmal atrial fibrillation while having speech / swallow evaluation.  Assessment & Plan: Active Problems:   Severe sepsis with acute organ dysfunction (HCC)  Severe sepsis secondary to Pseudomonas bacteremia: Patient presented with fever, tachycardia, tachypnea, lactic acid 5.3. He was treated for UTI with Keflex recently, continued to have fever. This could have been secondary to the UTI. Admitted to stepdown, patient received IV fluids in the ED. Post fluid resuscitation lactic acid 3.2.  we will continue IV fluids. Patient received vancomycin, cefepime and Flagyl in the ED.  Pharmacy consulted for dosing , MRSA nares negative.  Vancomycin discontinued Blood cultures grew gram-negative rods ( Pseudomonas) continue cefepime for now. Chest x-ray unremarkable, UA shows few WBCs otherwise nitrites, LE: negative. Repeat Lactic acid 2.3, will continue iv fluids. Continue cefepime.  Paroxysmal Atrial fibrillation: Patient has developed new onset A. fib while having a speech swallow evaluation. HR went up to 160,  patient was given  Lopressor 5 mg IV push. HR improved to 80.Patient went into sinus rhythm.  EKG shows atrial fibrillation Cardiology consulted will follow recommendation.  High anion gap metabolic acidosis secondary to DKA: >>> Resolved  Patient found to have a blood sugar of 802, high lactic acid, low Bicarb,  high anion gap. Elevated Hydroxy butyrate 1.91. Started DKA protocol, Continue insulin drip as per protocol, Transitioned to subcu insulin once blood glucose below 250. Continue IV fluids,  recheck BMP every 4 hours. Replace potassium as per protocol. Anion gap closed, blood sugar improved. Transitioned to Levemir 16 units daily in addition to NovoLog 0 to 6 units every 4 hours.  Hyponatremia:  >>> Resolved This could be due to dehydration. continue IV fluids. Sodium improved from 127 -> 138  Elevated liver enzymes: He is found to have elevated liver enzymes.   Noted to have some right lower quadrant tenderness. RUQ ultrasound shows cholelithiasis without any signs of acute cholecystitis.  AKI on CKD stage III: Baseline serum creatinine remains 2.4-2.6 Creatinine trends at 3.39-3.58 Avoid nephrotoxic medication Nephrology consulted , recommended nothing to offer. Renal functions stable.  Hypertension:  Resume home blood pressure medication. Added hydralazine as needed for blood pressure control  Dementia: Continue  Aricept.  DVT prophylaxis: Lovenox Code Status: Full Family Communication: spoke with daughter caroline Disposition Plan:  Status is: Inpatient  Remains inpatient appropriate because:Inpatient level of care appropriate due to severity of illness   Dispo: The patient is from: Home              Anticipated d/c is to: SNF              Anticipated d/c date  is: 3 days              Patient currently is not medically stable to d/c.   Consultants:   Nephrology  Procedures:  Antimicrobials:  Anti-infectives (From admission, onward)   Start     Dose/Rate Route  Frequency Ordered Stop   02/24/20 1300  vancomycin (VANCOCIN) IVPB 1000 mg/200 mL premix  Status:  Discontinued        1,000 mg 200 mL/hr over 60 Minutes Intravenous Every 48 hours 02/22/20 1331 02/24/20 0839   02/23/20 1500  ceFEPIme (MAXIPIME) 2 g in sodium chloride 0.9 % 100 mL IVPB        2 g 200 mL/hr over 30 Minutes Intravenous Every 24 hours 02/22/20 1457     02/22/20 2000  metroNIDAZOLE (FLAGYL) IVPB 500 mg  Status:  Discontinued        500 mg 100 mL/hr over 60 Minutes Intravenous Every 8 hours 02/22/20 1549 02/24/20 0839   02/22/20 1300  vancomycin (VANCOREADY) IVPB 2000 mg/400 mL        2,000 mg 200 mL/hr over 120 Minutes Intravenous  Once 02/22/20 1219 02/22/20 1545   02/22/20 1215  ceFEPIme (MAXIPIME) 2 g in sodium chloride 0.9 % 100 mL IVPB        2 g 200 mL/hr over 30 Minutes Intravenous  Once 02/22/20 1207 02/22/20 1406   02/22/20 1215  metroNIDAZOLE (FLAGYL) IVPB 500 mg        500 mg 100 mL/hr over 60 Minutes Intravenous  Once 02/22/20 1207 02/22/20 1406   02/22/20 1215  vancomycin (VANCOCIN) IVPB 1000 mg/200 mL premix  Status:  Discontinued        1,000 mg 200 mL/hr over 60 Minutes Intravenous  Once 02/22/20 1207 02/22/20 1219     Subjective: Patient was seen and examined at bedside.  Overnight events noted.  Patient is more alert and awake today, He is following commands.  His heart rate went up to 150 while having speech and swallow evaluation.    Objective: Vitals:   02/24/20 1200 02/24/20 1300 02/24/20 1400 02/24/20 1500  BP: (!) 153/58 (!) 164/63 (!) 147/67 (!) 185/68  Pulse:  (!) 38 (!) 37 (!) 48  Resp: 14 20 17  (!) 21  Temp:      TempSrc:      SpO2:  99% 100% 90%  Weight:      Height:        Intake/Output Summary (Last 24 hours) at 02/24/2020 1539 Last data filed at 02/24/2020 1513 Gross per 24 hour  Intake 3997.3 ml  Output 1500 ml  Net 2497.3 ml   Filed Weights   02/22/20 1147 02/22/20 2345 02/24/20 0500  Weight: 90.7 kg 81.1 kg 84.9 kg     Examination:  General exam: Appears calm and comfortable.  More Alert and oriented today. Respiratory system: Clear to auscultation. Respiratory effort normal. Cardiovascular system: S1 & S2 heard, RRR. No JVD, murmurs, rubs, gallops or clicks. No pedal edema. Gastrointestinal system: Abdomen is nondistended, soft and nontender. No organomegaly or masses felt. Normal bowel sounds heard. Central nervous system: Alert and oriented. No focal neurological deficits. Extremities: No edema, no cyanosis, no clubbing  Skin: No rashes, lesions or ulcers Psychiatry: . Mood & affect appropriate.     Data Reviewed: I have personally reviewed following labs and imaging studies  CBC: Recent Labs  Lab 02/22/20 1220 02/23/20 0403 02/24/20 0444  WBC 8.1 6.2 6.3  NEUTROABS 7.3  --   --  HGB 11.0* 9.7* 9.6*  HCT 34.5* 29.5* 30.3*  MCV 86.5 84.3 83.9  PLT 203 148* 169   Basic Metabolic Panel: Recent Labs  Lab 02/22/20 1220 02/22/20 1735 02/22/20 2350 02/23/20 0403 02/24/20 0444  NA 127* 136 138 136 143  K 5.1 4.2 4.3 4.1 3.8  CL 91* 104 105 107 112*  CO2 19* 17* 19* 21* 22  GLUCOSE 802* 332* 91 174* 205*  BUN 62* 60* 62* 64* 65*  CREATININE 3.79* 3.57* 3.39* 3.52* 2.90*  CALCIUM 9.1 8.6* 8.7* 8.1* 8.7*  MG  --   --   --   --  2.4  PHOS  --   --   --   --  2.7   GFR: Estimated Creatinine Clearance: 18.6 mL/min (A) (by C-G formula based on SCr of 2.9 mg/dL (H)). Liver Function Tests: Recent Labs  Lab 02/22/20 1220 02/23/20 0403 02/24/20 0444  AST 154* 114* 61*  ALT 55* 43 40  ALKPHOS 79 48 47  BILITOT 0.9 0.6 0.6  PROT 7.1 5.4* 5.5*  ALBUMIN 3.5 2.5* 2.2*   No results for input(s): LIPASE, AMYLASE in the last 168 hours. No results for input(s): AMMONIA in the last 168 hours. Coagulation Profile: Recent Labs  Lab 02/22/20 1220 02/23/20 0403  INR 1.3* 1.3*   Cardiac Enzymes: No results for input(s): CKTOTAL, CKMB, CKMBINDEX, TROPONINI in the last 168 hours. BNP  (last 3 results) No results for input(s): PROBNP in the last 8760 hours. HbA1C: Recent Labs    02/22/20 1343  HGBA1C >15.5*   CBG: Recent Labs  Lab 02/23/20 1955 02/24/20 0028 02/24/20 0402 02/24/20 0724 02/24/20 1110  GLUCAP 202* 196* 191* 192* 250*   Lipid Profile: No results for input(s): CHOL, HDL, LDLCALC, TRIG, CHOLHDL, LDLDIRECT in the last 72 hours. Thyroid Function Tests: No results for input(s): TSH, T4TOTAL, FREET4, T3FREE, THYROIDAB in the last 72 hours. Anemia Panel: No results for input(s): VITAMINB12, FOLATE, FERRITIN, TIBC, IRON, RETICCTPCT in the last 72 hours. Sepsis Labs: Recent Labs  Lab 02/22/20 1220 02/22/20 1343 02/23/20 0403 02/24/20 0444 02/24/20 0935  PROCALCITON  --   --  >150.00  --   --   LATICACIDVEN 5.1* 3.5*  --  2.1* 2.3*    Recent Results (from the past 240 hour(s))  Urine culture     Status: Abnormal (Preliminary result)   Collection Time: 02/22/20 12:05 PM   Specimen: In/Out Cath Urine  Result Value Ref Range Status   Specimen Description   Final    IN/OUT CATH URINE Performed at [facility], [address], [place], [place] [address]    Special Requests   Final    NONE Performed at [facility], [address], [place], [place] [address]    Culture (A)  Final    20,000 COLONIES/mL PSEUDOMONAS AERUGINOSA SUSCEPTIBILITIES TO FOLLOW Performed at [facility], [place] [address] [place], [place] [address]    Report Status PENDING  Incomplete  Respiratory Panel by RT PCR (Flu A&B, Covid) - Nasopharyngeal Swab     Status: None   Collection Time: 02/22/20 12:10 PM   Specimen: Nasopharyngeal Swab  Result Value Ref Range Status   SARS Coronavirus 2 by RT PCR NEGATIVE NEGATIVE Final    Comment: (NOTE) SARS-CoV-2 target nucleic acids are NOT DETECTED.  The SARS-CoV-2 RNA is generally detectable in upper respiratoy specimens during the acute phase of infection. The lowest concentration of SARS-CoV-2 viral copies this assay  can detect is 131 copies/mL. A  negative result does not preclude SARS-Cov-2 infection and should not be used as the sole basis for treatment or other patient management decisions. A negative result may occur with  improper specimen collection/handling, submission of specimen other than nasopharyngeal swab, presence of viral mutation(s) within the areas targeted by this assay, and inadequate number of viral copies (<131 copies/mL). A negative result must be combined with clinical observations, patient history, and epidemiological information. The expected result is Negative.  Fact Sheet for Patients:  PinkCheek.be  Fact Sheet for Healthcare Providers:  GravelBags.it  This test is no t yet approved or cleared by the [place] FDA and  has been authorized for detection and/or diagnosis of SARS-CoV-2 by FDA under an Emergency Use Authorization (EUA). This EUA will remain  in effect (meaning this test can be used) for the duration of the COVID-19 declaration under Section 564(b)(1) of the Act, 21 U.S.C. section 360bbb-3(b)(1), unless the authorization is terminated or revoked sooner.     Influenza A by PCR NEGATIVE NEGATIVE Final   Influenza B by PCR NEGATIVE NEGATIVE Final    Comment: (NOTE) The Xpert Xpress SARS-CoV-2/FLU/RSV assay is intended as an aid in  the diagnosis of influenza from Nasopharyngeal swab specimens and  should not be used as a sole basis for treatment. Nasal washings and  aspirates are unacceptable for Xpert Xpress SARS-CoV-2/FLU/RSV  testing.  Fact Sheet for Patients: PinkCheek.be  Fact Sheet for Healthcare Providers: GravelBags.it  This test is not yet approved or cleared by the [place] FDA and  has been authorized for detection and/or diagnosis of SARS-CoV-2 by  FDA under an Emergency Use Authorization (EUA). This EUA will remain   in effect (meaning this test can be used) for the duration of the  Covid-19 declaration under Section 564(b)(1) of the Act, 21  U.S.C. section 360bbb-3(b)(1), unless the authorization is  terminated or revoked. Performed at [facility], [address], [place], [place] [address]   Blood Culture (routine x 2)     Status: Abnormal (Preliminary result)   Collection Time: 02/22/20 12:20 PM   Specimen: BLOOD LEFT HAND  Result Value Ref Range Status   Specimen Description   Final    BLOOD LEFT HAND BOTTLES DRAWN AEROBIC AND ANAEROBIC Performed at [facility], [address] [place], [place] [address]    Special Requests   Final    Blood Culture adequate volume Performed at [facility], [address], [place], [place] [address]    Culture  Setup Time   Final    GRAM NEGATIVE RODS Gram Stain Report Called to,Read Back By and Verified With: DANIELS,J @ [place] ON 02/23/20 BY JUW AEROBIC BOTTLE ONLY GS DONE @ [facility] Performed at [facility], [address] [place], [place] [address]    Culture PSEUDOMONAS AERUGINOSA (A)  Final   Report Status PENDING  Incomplete  Blood Culture (routine x 2)     Status: Abnormal (Preliminary result)   Collection Time: 02/22/20 12:20 PM   Specimen: Right Antecubital; Blood  Result Value Ref Range Status   Specimen Description   Final    RIGHT ANTECUBITAL BOTTLES DRAWN AEROBIC AND ANAEROBIC Performed at [facility], [address], [place], [place] [address]    Special Requests   Final    Blood Culture adequate volume Performed at [facility], [address] [place], [place] [address]    Culture  Setup Time   Final    GRAM NEGATIVE RODS Gram Stain Report Called to,Read Back By  and Verified With: DANIELS,J @ 0535 ON 02/23/20 BY JUW AEROBIC BOTTLE ONLY GS DONE @ [facility]    Culture (A)  Final    PSEUDOMONAS AERUGINOSA SUSCEPTIBILITIES TO FOLLOW Performed at [facility], [place] [address], [place], [place] [address]    Report Status PENDING   Incomplete  Blood Culture ID Panel (Reflexed)     Status: Abnormal   Collection Time: 02/22/20 12:20 PM  Result Value Ref Range Status   Enterococcus faecalis NOT DETECTED NOT DETECTED Final   Enterococcus Faecium NOT DETECTED NOT DETECTED Final   Listeria monocytogenes NOT DETECTED NOT DETECTED Final   Staphylococcus species NOT DETECTED NOT DETECTED Final   Staphylococcus aureus (BCID) NOT DETECTED NOT DETECTED Final   Staphylococcus epidermidis NOT DETECTED NOT DETECTED Final   Staphylococcus lugdunensis NOT DETECTED NOT DETECTED Final   Streptococcus species NOT DETECTED NOT DETECTED Final   Streptococcus agalactiae NOT DETECTED NOT DETECTED Final   Streptococcus pneumoniae NOT DETECTED NOT DETECTED Final   Streptococcus pyogenes NOT DETECTED NOT DETECTED Final   A.calcoaceticus-baumannii NOT DETECTED NOT DETECTED Final   Bacteroides fragilis NOT DETECTED NOT DETECTED Final   Enterobacterales NOT DETECTED NOT DETECTED Final   Enterobacter cloacae complex NOT DETECTED NOT DETECTED Final   Escherichia coli NOT DETECTED NOT DETECTED Final   Klebsiella aerogenes NOT DETECTED NOT DETECTED Final   Klebsiella oxytoca NOT DETECTED NOT DETECTED Final   Klebsiella pneumoniae NOT DETECTED NOT DETECTED Final   Proteus species NOT DETECTED NOT DETECTED Final   Salmonella species NOT DETECTED NOT DETECTED Final   Serratia marcescens NOT DETECTED NOT DETECTED Final   Haemophilus influenzae NOT DETECTED NOT DETECTED Final   Neisseria meningitidis NOT DETECTED NOT DETECTED Final   Pseudomonas aeruginosa DETECTED (A) NOT DETECTED Final    Comment: CRITICAL RESULT CALLED TO, READ BACK BY AND VERIFIED WITH: Hyman Hopes RN, AT 1245 02/23/20 BY D. VANHOOK    Stenotrophomonas maltophilia NOT DETECTED NOT DETECTED Final   Candida albicans NOT DETECTED NOT DETECTED Final   Candida auris NOT DETECTED NOT DETECTED Final   Candida glabrata NOT DETECTED NOT DETECTED Final   Candida krusei NOT DETECTED  NOT DETECTED Final   Candida parapsilosis NOT DETECTED NOT DETECTED Final   Candida tropicalis NOT DETECTED NOT DETECTED Final   Cryptococcus neoformans/gattii NOT DETECTED NOT DETECTED Final   CTX-M ESBL NOT DETECTED NOT DETECTED Final   Carbapenem resistance IMP NOT DETECTED NOT DETECTED Final   Carbapenem resistance KPC NOT DETECTED NOT DETECTED Final   Carbapenem resistance NDM NOT DETECTED NOT DETECTED Final   Carbapenem resistance VIM NOT DETECTED NOT DETECTED Final    Comment: Performed at [facility], [address] [place], [place] [address]  MRSA PCR Screening     Status: None   Collection Time: 02/22/20 12:28 PM   Specimen: Nasopharyngeal  Result Value Ref Range Status   MRSA by PCR NEGATIVE NEGATIVE Final    Comment:        The GeneXpert MRSA Assay (FDA approved for NASAL specimens only), is one component of a comprehensive MRSA colonization surveillance program. It is not intended to diagnose MRSA infection nor to guide or monitor treatment for MRSA infections. Performed at [facility], [address] [place], [place] [address]     Radiology Studies: US Abdomen Limited RUQ  Result Date: 02/23/2020 CLINICAL DATA:  Elevated liver enzymes EXAM: ULTRASOUND ABDOMEN LIMITED RIGHT UPPER QUADRANT COMPARISON:  August 25, 2017 FINDINGS: Gallbladder: No wall thickening visualized. Gallstones  are identified. 0.3 cm polyp is identified. No sonographic Murphy sign noted by sonographer. Common bile duct: Diameter: 0.3 mm Liver: 1.5 x 1.1 x 1.4 cm cyst is identified in the left lobe liver. Within normal limits in parenchymal echogenicity. Portal vein is patent on color Doppler imaging with normal direction of blood flow towards the liver. Other: None. IMPRESSION: 1. Cholelithiasis without sonographic evidence of acute cholecystitis. 2. 1.5 cm cyst in the left lobe liver. Electronically Signed   By: Abelardo Diesel M.D.   On: 02/23/2020 14:49   Scheduled Meds: . busPIRone  5 mg  Oral BID  . Chlorhexidine Gluconate Cloth  6 each Topical Daily  . cloNIDine  0.1 mg Transdermal Weekly  . docusate sodium  100 mg Oral BID  . donepezil  5 mg Oral QHS  . enoxaparin (LOVENOX) injection  30 mg Subcutaneous Q24H  . furosemide  40 mg Oral Daily  . hydrALAZINE  50 mg Oral TID  . insulin aspart  0-6 Units Subcutaneous Q4H  . insulin detemir  16 Units Subcutaneous Daily  . mouth rinse  15 mL Mouth Rinse BID  . senna  1 tablet Oral BID  . sodium chloride flush  3 mL Intravenous Q12H   Continuous Infusions: . ceFEPime (MAXIPIME) IV 2 g (02/24/20 1513)  . dextrose 5% lactated ringers 125 mL/hr at 02/24/20 1513  . lactated ringers    . lactated ringers Stopped (02/22/20 1848)     LOS: 2 days    Time spent: 35 mins.    Shawna Clamp, MD [facility]   If 7PM-7AM, please contact night-coverage

## 2020-02-24 NOTE — Progress Notes (Signed)
CRITICAL VALUE ALERT  Critical Value:  Lactic Acid 2.3  Date & Time Notied: 10/11, 1030  Provider Notified: Dr. Dwyane Dee   Orders Received/Actions taken: Awaiting orders

## 2020-02-24 NOTE — Evaluation (Signed)
Clinical/Bedside Swallow Evaluation Patient Details  Name: Paul Perez MRN: [id number] Date of Birth: [birth year]  Today's Date: 02/24/2020 Time: SLP Start Time (ACUTE ONLY): 0940 SLP Stop Time (ACUTE ONLY): 1012 SLP Time Calculation (min) (ACUTE ONLY): 32 min  Past Medical History:  Past Medical History:  Diagnosis Date  . Dementia ([place])   . Diabetes mellitus without complication ([place])   . Encephalopathy   . Gastritis 2004  . GIB (gastrointestinal bleeding) 2004  . Hypertension   . Poor historian   . Sinus bradycardia seen on cardiac monitor    Past Surgical History:  Past Surgical History:  Procedure Laterality Date  . INGUINAL HERNIA REPAIR  09/2010   Bilateral  . UPPER GASTROINTESTINAL ENDOSCOPY  2004   HPI:  This 86 y.o.malewith medical history significant ofDementia,DiabetesMellitus II, Hypertension, CKD stage IV, premature atrial contractions,history of GI bleed, presents in the emergency department with confusion and fever. This is patient's third ER visit In last three weeks.Last week he presented in the ED with generalized weakness and pain,found to have a UTI . He was discharged on Keflex. Daughter reportshe has been having worsening pain and weakness, was not able to stand up in the morning. Patient is admitted for sepsis secondary to Pseudomonas bacteremia.  He was also found to have high anion gap metabolic acidosis secondary to DKA which has been resolved. He is transitioned to subcu insulin. BSE requested.    Assessment / Plan / Recommendation Clinical Impression  Clinical swallow evaluation completed at bedside. Oral motor examination reveals WNL movements, missing some molars, black coating on tongue and teeth. Pt able to follow simple commands. He requests water frequently. Pt assessed with ice chips, water via cup/straw, puree, and crackers. Pt moaning and shouting out at times. He did not demonstrate any coughing or throat clearing, but somewhat  impulsive with thin liquid intake (reduced labial closure with cup sips, large sips with straw despite cues, audible swallow). Pt's heart rate increased to 155 and BP 198/82 and nursing to room to address. PO trials abandoned due to changes in vitals. Pt did not appear short of breath and O2 sats WNL. Would recommend continued NPO except for po medications crushed in puree and ok for ice chips after oral care with SLP to check back tomorrow. Above to RN.   SLP Visit Diagnosis: Dysphagia, unspecified (R13.10)    Aspiration Risk  Mild aspiration risk    Diet Recommendation Ice chips PRN after oral care;NPO except meds   Medication Administration: Crushed with puree Supervision: Staff to assist with self feeding Compensations: Slow rate;Small sips/bites Postural Changes: Seated upright at 90 degrees;Remain upright for at least 30 minutes after po intake    Other  Recommendations Oral Care Recommendations: Oral care prior to ice chip/H20;Staff/trained caregiver to provide oral care   Follow up Recommendations 24 hour supervision/assistance (pending clinical course)      Frequency and Duration min 2x/week  1 week       Prognosis Prognosis for Safe Diet Advancement: Fair Barriers to Reach Goals: Cognitive deficits      Swallow Study   General Date of Onset: 02/22/20 HPI: This 86 y.o.malewith medical history significant ofDementia,DiabetesMellitus II, Hypertension, CKD stage IV, premature atrial contractions,history of GI bleed, presents in the emergency department with confusion and fever. This is patient's third ER visit In last three weeks.Last week he presented in the ED with generalized weakness and pain,found to have a UTI . He was discharged on Keflex. Daughter reportshe has been  having worsening pain and weakness, was not able to stand up in the morning. Patient is admitted for sepsis secondary to Pseudomonas bacteremia.  He was also found to have high anion gap  metabolic acidosis secondary to DKA which has been resolved. He is transitioned to subcu insulin. BSE requested.  Type of Study: Bedside Swallow Evaluation Previous Swallow Assessment: BSE reg/thin, 08/2017 Diet Prior to this Study: NPO Temperature Spikes Noted: No Respiratory Status: Room air History of Recent Intubation: No Behavior/Cognition: Alert;Cooperative;Requires cueing;Agitated Oral Cavity Assessment:  (black coating on tongue and teeth) Oral Care Completed by SLP: Yes Oral Cavity - Dentition: Adequate natural dentition;Missing dentition Vision: Impaired for self-feeding Self-Feeding Abilities: Total assist Patient Positioning: Upright in bed Baseline Vocal Quality: Normal Volitional Cough: Strong Volitional Swallow: Able to elicit    Oral/Motor/Sensory Function Overall Oral Motor/Sensory Function: Within functional limits   Ice Chips Ice chips: Within functional limits Presentation: Spoon   Thin Liquid Thin Liquid: Impaired Presentation: Cup;Straw Oral Phase Impairments: Reduced labial seal Pharyngeal  Phase Impairments: Change in Vital Signs (heart rate) Other Comments: impulsive with intake, audible swallow    Nectar Thick Nectar Thick Liquid: Not tested   Honey Thick Honey Thick Liquid: Not tested   Puree Puree: Within functional limits Presentation: Spoon   Solid     Solid: Impaired Oral Phase Impairments: Poor awareness of bolus;Impaired mastication;Reduced lingual movement/coordination Oral Phase Functional Implications: Oral residue     Thank you,  Genene Churn, [place]  Mihcael Ledee 02/24/2020,10:26 AM

## 2020-02-24 NOTE — Progress Notes (Addendum)
Inpatient Diabetes Program Recommendations  AACE/ADA: New Consensus Statement on Inpatient Glycemic Control   Target Ranges:  Prepandial:   less than 140 mg/dL      Peak postprandial:   less than 180 mg/dL (1-2 hours)      Critically ill patients:  140 - 180 mg/dL   Results for Paul Perez, Paul Perez "ED" (MRN [id number]) as of 02/24/2020 10:26  Ref. Range 02/23/2020 08:16 02/23/2020 10:35 02/23/2020 11:20 02/23/2020 13:23 02/23/2020 15:07 02/23/2020 19:55 02/24/2020 00:28 02/24/2020 04:02 02/24/2020 07:24  Glucose-Capillary Latest Ref Range: 70 - 99 mg/dL 158 (H) 174 (H) 166 (H) 154 (H) 158 (H) 202 (H) 196 (H) 191 (H) 192 (H)  Results for Paul Perez, Paul Perez "ED" (MRN [id number]) as of 02/24/2020 10:26  Ref. Range 02/22/2020 13:43  Hemoglobin A1C Latest Ref Range: 4.8 - 5.6 % >15.5 (H)  Results for Paul Perez, Paul Perez "ED" (MRN [id number]) as of 02/24/2020 10:26  Ref. Range 02/22/2020 12:20  Glucose Latest Ref Range: 70 - 99 mg/dL 802 (HH)   Review of Glycemic Control  Diabetes history: DM2 Outpatient Diabetes medications: Actos 15 mg daily Current orders for Inpatient glycemic control: Levemir 16 units daily, Novolog 0-6 units Q4H  Inpatient Diabetes Program Recommendations:    HbgA1C:  A1C >15.5% on 02/22/20 indicating an average glucose over 398 mg/dl over the past 2-3 months. Anticipate patient will need to be discharge on insulin.  Addendum 02/24/20@14 :10-Spoke with patient's son Paul Perez) over the phone. Paul Perez reports that his father as been at ALF for 1-2 years and he was only taking a pill for DM. Paul Perez reports that he has talked with staff at [facility] and they had said that they checked patient's glucose once a day and the highest it has been was 180 mg/dl. In reviewing chart, noted patient came to ED on 02/14/20 and lab glucose was 548 mg/dl on 02/14/20. Paul Perez notes that he was not aware of any issues with high glucose. Discussed initial glucose of 802 mg/dl on 02/22/20 and inquired about  knowledge of an A1C. Paul Perez reports that he has heard of an A1C but is no quite sure what it is. Explained what an A1C is and discussed current A1C of >15.5% on 02/22/20. Discussed glucose and A1C goals.  Paul Perez reports that patient will be discharging to SNF for rehab when MD is ready to discharge him and then he is hopeful he will be able to go back to ALF after that.  Explained that patient will likely need to be discharged on insulin for DM control and patient will need to have glucose consistently checked 3-4 times per day and to be given DM medications consistently. Encouraged Paul Perez to follow up and stay in contact with SNF and/or ALF staff to ensure patient's DM is being managed well. Paul Perez verbalized understanding of information discussed and states that he has no questions related to DM at this time. Paul Perez was asking about whether he needed to contact SNF. Informed patient that [facility] team would be working with SNF and should be keeping him informed and they should let him know if he needs to contact SNF for additional information.    Thanks, Barnie Alderman, RN, MSN, CDE Diabetes Coordinator Inpatient Diabetes Program [phone] (Team Pager from 8am to 5pm)

## 2020-02-24 NOTE — Progress Notes (Signed)
During SLP consult patient's heart rate increased to 155 and blood pressure increased to 198/82. Attempted to calm patient with no change. IV hydralazine 10 mg given. 12 lead EKG done reading afib with RVR. Dr. Dwyane Dee notified and gave stat order for 5 mg metoprolol. Metoprolol given at 1019. At 1025 monitor read heart rate of 82 and blood pressure of 133/49. Patient is currently back at baseline. Will continue to monitor.

## 2020-02-25 ENCOUNTER — Other Ambulatory Visit: Payer: Self-pay

## 2020-02-25 DIAGNOSIS — R6520 Severe sepsis without septic shock: Secondary | ICD-10-CM | POA: Diagnosis not present

## 2020-02-25 DIAGNOSIS — A419 Sepsis, unspecified organism: Secondary | ICD-10-CM | POA: Diagnosis not present

## 2020-02-25 DIAGNOSIS — I4891 Unspecified atrial fibrillation: Secondary | ICD-10-CM | POA: Diagnosis not present

## 2020-02-25 LAB — URINE CULTURE: Culture: 20000 — AB

## 2020-02-25 LAB — CBC
HCT: 30.2 % — ABNORMAL LOW (ref 39.0–52.0)
Hemoglobin: 9.7 g/dL — ABNORMAL LOW (ref 13.0–17.0)
MCH: 27.4 pg (ref 26.0–34.0)
MCHC: 32.1 g/dL (ref 30.0–36.0)
MCV: 85.3 fL (ref 80.0–100.0)
Platelets: 176 10*3/uL (ref 150–400)
RBC: 3.54 MIL/uL — ABNORMAL LOW (ref 4.22–5.81)
RDW: 13.5 % (ref 11.5–15.5)
WBC: 7.3 10*3/uL (ref 4.0–10.5)
nRBC: 0 % (ref 0.0–0.2)

## 2020-02-25 LAB — BASIC METABOLIC PANEL
Anion gap: 10 (ref 5–15)
BUN: 58 mg/dL — ABNORMAL HIGH (ref 8–23)
CO2: 23 mmol/L (ref 22–32)
Calcium: 8.9 mg/dL (ref 8.9–10.3)
Chloride: 112 mmol/L — ABNORMAL HIGH (ref 98–111)
Creatinine, Ser: 2.48 mg/dL — ABNORMAL HIGH (ref 0.61–1.24)
GFR, Estimated: 22 mL/min — ABNORMAL LOW (ref >60)
Glucose, Bld: 169 mg/dL — ABNORMAL HIGH (ref 70–99)
Potassium: 3.7 mmol/L (ref 3.5–5.1)
Sodium: 145 mmol/L (ref 135–145)

## 2020-02-25 LAB — GLUCOSE, CAPILLARY
Glucose-Capillary: 155 mg/dL — ABNORMAL HIGH (ref 70–99)
Glucose-Capillary: 196 mg/dL — ABNORMAL HIGH (ref 70–99)
Glucose-Capillary: 274 mg/dL — ABNORMAL HIGH (ref 70–99)
Glucose-Capillary: 217 mg/dL — ABNORMAL HIGH (ref 70–99)
Glucose-Capillary: 270 mg/dL — ABNORMAL HIGH (ref 70–99)

## 2020-02-25 LAB — MAGNESIUM: Magnesium: 2.5 mg/dL — ABNORMAL HIGH (ref 1.7–2.4)

## 2020-02-25 LAB — CULTURE, BLOOD (ROUTINE X 2)
Special Requests: ADEQUATE
Special Requests: ADEQUATE

## 2020-02-25 LAB — PHOSPHORUS: Phosphorus: 2 mg/dL — ABNORMAL LOW (ref 2.5–4.6)

## 2020-02-25 MED ORDER — INFLUENZA VAC A&B SA ADJ QUAD 0.5 ML IM PRSY
0.5000 mL | PREFILLED_SYRINGE | INTRAMUSCULAR | Status: AC
  Administered 2020-02-26: 0.5 mL via INTRAMUSCULAR
  Filled 2020-02-25: qty 0.5

## 2020-02-25 MED ORDER — METOPROLOL TARTRATE 5 MG/5ML IV SOLN
INTRAVENOUS | Status: AC
  Administered 2020-02-25: 5 mg via INTRAVENOUS
  Filled 2020-02-25: qty 5

## 2020-02-25 MED ORDER — HALOPERIDOL LACTATE 5 MG/ML IJ SOLN
2.0000 mg | Freq: Once | INTRAMUSCULAR | Status: AC
  Administered 2020-02-25: 2 mg via INTRAVENOUS
  Filled 2020-02-25: qty 1

## 2020-02-25 MED ORDER — METOPROLOL TARTRATE 25 MG PO TABS
12.5000 mg | ORAL_TABLET | Freq: Two times a day (BID) | ORAL | Status: DC
  Administered 2020-02-25 – 2020-02-27 (×4): 12.5 mg via ORAL
  Filled 2020-02-25 (×3): qty 1

## 2020-02-25 NOTE — Progress Notes (Signed)
  Speech Language Pathology Treatment: Dysphagia  Patient Details Name: Paul Perez MRN: [id number] DOB: [birth year] Today's Date: 02/25/2020 Time: 3664-4034 SLP Time Calculation (min) (ACUTE ONLY): 22 min  Assessment / Plan / Recommendation Clinical Impression  Pt seen for ongoing dysphagia intervention. Pt more alert and communicative today (less crying out). He was assessed with ice chips, thin water via tsp/cup/straw, puree, regular textures, and mechanical soft textures. Pt without signs of symptoms of aspiration, however he does present with mild oral phase characterized by reduced lingual movement and impaired mastication with prolonged oral prep. Pt required verbal cues to close his lips around the cup at times. Pt with xerostomia, which likely exacerbates difficulty with mastication and manipulation of solids. Recommend D3/mech soft and thin liquids (straw ok), encourage small bites/sips and will need feeder assist. PO medications whole or crushed in puree as able. SLP will continue to follow in acute setting. Above to RN, no family present at this time.    HPI HPI: This 86 y.o.malewith medical history significant ofDementia,DiabetesMellitus II, Hypertension, CKD stage IV, premature atrial contractions,history of GI bleed, presents in the emergency department with confusion and fever. This is patient's third ER visit In last three weeks.Last week he presented in the ED with generalized weakness and pain,found to have a UTI . He was discharged on Keflex. Daughter reportshe has been having worsening pain and weakness, was not able to stand up in the morning. Patient is admitted for sepsis secondary to Pseudomonas bacteremia.  He was also found to have high anion gap metabolic acidosis secondary to DKA which has been resolved. He is transitioned to subcu insulin. BSE requested.       SLP Plan  Continue with current plan of care       Recommendations  Diet  recommendations: Dysphagia 3 (mechanical soft);Thin liquid Liquids provided via: Cup;Straw Medication Administration: Whole meds with puree Supervision: Staff to assist with self feeding;Full supervision/cueing for compensatory strategies Compensations: Slow rate;Small sips/bites Postural Changes and/or Swallow Maneuvers: Seated upright 90 degrees;Upright 30-60 min after meal                Oral Care Recommendations: Oral care BID;Staff/trained caregiver to provide oral care Follow up Recommendations: 24 hour supervision/assistance SLP Visit Diagnosis: Dysphagia, unspecified (R13.10) Plan: Continue with current plan of care       Thank you,  Genene Churn, [place]                 [place] 02/25/2020, 1:43 PM

## 2020-02-25 NOTE — Progress Notes (Signed)
PROGRESS NOTE    Paul Perez  WLN:[id number] DOB: 09/20/1927 DOA: 02/22/2020 PCP: Alanson Puls [facility]   Brief Narrative: This 86 y.o. male with medical history significant of Dementia, Diabetes Mellitus II,  Hypertension, CKD stage IV, premature atrial contractions , history of GI bleed, presents in the emergency department with confusion and fever. This is patient's third ER visit  In last three weeks.  Last week he presented in the ED with generalized weakness and pain,  found to have a UTI . He was discharged on Keflex. Daughter reports he has been having worsening pain and weakness, was not able to stand up in the morning. Patient is admitted for septic shock secondary to Pseudomonas bacteremia.  He was also found to have high anion gap metabolic acidosis secondary to DKA which has been resolved. He is transitioned to subcu insulin. He has developed paroxysmal atrial fibrillation while having speech / swallow evaluation. He is back in sinus rhythm and HR controlled.  Assessment & Plan: Active Problems:   Severe sepsis with acute organ dysfunction (HCC)  Septic shock secondary to Pseudomonas bacteremia: Patient presented with fever, tachycardia, tachypnea, lactic acid 5.3. He was treated for UTI with Keflex recently, continued to have fever. This could have been secondary to the UTI. Admitted to stepdown, patient received IV fluids in the ED. Post fluid resuscitation lactic acid 3.2.  we will continue IV fluids. Patient received vancomycin, cefepime and Flagyl in the ED.  Pharmacy consulted for dosing , MRSA nares negative.  Vancomycin discontinued Blood cultures grew gram-negative rods ( Pseudomonas) continue cefepime for now. Chest x-ray unremarkable, UA shows few WBCs otherwise nitrites, LE: negative. Repeat Lactic acid 2.3, will continue iv fluids. Continue cefepime. Confirmed with pharmacy total 7 days of IV cefepime.  Paroxysmal Atrial fibrillation: Patient has  developed new onset A. fib while having a speech swallow evaluation. HR went up to 160,  patient was given Lopressor 5 mg IV push. HR improved to 80. Patient converted to sinus rhythm.  EKG showed atrial fibrillation Cardiology consulted , recommended metoprolol 12.5 twice daily. Heart rate remains controlled in sinus rhythm,  CHADS 4. Patient is not a candidate for anticoagulation given dementia and high risks for falls  High anion gap metabolic acidosis secondary to DKA: >>> Resolved  Patient found to have a blood sugar of 802, high lactic acid, low Bicarb,  high anion gap. Elevated Hydroxy butyrate 1.91. Started DKA protocol, Continue insulin drip as per protocol, Transitioned to subcu insulin once blood glucose below 250. Continue IV fluids,  recheck BMP every 4 hours. Replace potassium as per protocol. Anion gap closed, blood sugar improved. Transitioned to Levemir 16 units daily in addition to NovoLog 0 to 6 units every 4 hours.  Hyponatremia:  >>> Resolved This could be due to dehydration. continue IV fluids. Sodium improved from 127 -> 138  Elevated liver enzymes: He is found to have elevated liver enzymes.   Noted to have some right lower quadrant tenderness. RUQ ultrasound shows cholelithiasis without any signs of acute cholecystitis. Liver enzymes trending down.  AKI on CKD stage III: Baseline serum creatinine remains 2.4-2.6 Creatinine trends at 3.39-3.58 -2.48 Avoid nephrotoxic medication Nephrology consulted , recommended nothing to offer. Renal functions stable.  Hypertension:  Resume home blood pressure medication. Added hydralazine as needed for blood pressure control.  Dementia: Continue  Aricept.  DVT prophylaxis: Lovenox Code Status: Full Family Communication: spoke with daughter caroline on phone Disposition Plan:  Status is: Inpatient  Remains  inpatient appropriate because:Inpatient level of care appropriate due to severity of  illness   Dispo: The patient is from: Home              Anticipated d/c is to: SNF              Anticipated d/c date is: 3 days              Patient currently is not medically stable to d/c.   Consultants:   Nephrology  Procedures:  Antimicrobials:  Anti-infectives (From admission, onward)   Start     Dose/Rate Route Frequency Ordered Stop   02/24/20 1300  vancomycin (VANCOCIN) IVPB 1000 mg/200 mL premix  Status:  Discontinued        1,000 mg 200 mL/hr over 60 Minutes Intravenous Every 48 hours 02/22/20 1331 02/24/20 0839   02/23/20 1500  ceFEPIme (MAXIPIME) 2 g in sodium chloride 0.9 % 100 mL IVPB        2 g 200 mL/hr over 30 Minutes Intravenous Every 24 hours 02/22/20 1457     02/22/20 2000  metroNIDAZOLE (FLAGYL) IVPB 500 mg  Status:  Discontinued        500 mg 100 mL/hr over 60 Minutes Intravenous Every 8 hours 02/22/20 1549 02/24/20 0839   02/22/20 1300  vancomycin (VANCOREADY) IVPB 2000 mg/400 mL        2,000 mg 200 mL/hr over 120 Minutes Intravenous  Once 02/22/20 1219 02/22/20 1545   02/22/20 1215  ceFEPIme (MAXIPIME) 2 g in sodium chloride 0.9 % 100 mL IVPB        2 g 200 mL/hr over 30 Minutes Intravenous  Once 02/22/20 1207 02/22/20 1406   02/22/20 1215  metroNIDAZOLE (FLAGYL) IVPB 500 mg        500 mg 100 mL/hr over 60 Minutes Intravenous  Once 02/22/20 1207 02/22/20 1406   02/22/20 1215  vancomycin (VANCOCIN) IVPB 1000 mg/200 mL premix  Status:  Discontinued        1,000 mg 200 mL/hr over 60 Minutes Intravenous  Once 02/22/20 1207 02/22/20 1219     Subjective: Patient was seen and examined at bedside.  Overnight events noted.  Patient is more alert and awake today, He is following commands.  His heart rate is better controlled, found to have right arm swelling.  Objective: Vitals:   02/25/20 1200 02/25/20 1258 02/25/20 1300 02/25/20 1400  BP: (!) 194/67  (!) 200/75 (!) 196/67  Pulse: (!) 123  73 69  Resp: 12  15 15   Temp:  99.4 F (37.4 C)    TempSrc:   Oral    SpO2: 98%  99% 99%  Weight:      Height:        Intake/Output Summary (Last 24 hours) at 02/25/2020 1642 Last data filed at 02/25/2020 1600 Gross per 24 hour  Intake 2368.07 ml  Output 2750 ml  Net -381.93 ml   Filed Weights   02/22/20 2345 02/24/20 0500 02/25/20 0434  Weight: 81.1 kg 84.9 kg 87.1 kg    Examination:  General exam: Appears calm and comfortable.  More Alert and oriented today. Respiratory system: Clear to auscultation. Respiratory effort normal. Cardiovascular system: S1 & S2 heard, RRR. No JVD, murmurs, rubs, gallops or clicks. No pedal edema. Gastrointestinal system: Abdomen is nondistended, soft and nontender. No organomegaly or masses felt. Normal bowel sounds heard. Central nervous system: Alert and oriented. No focal neurological deficits. Extremities: Right Arm swelling , No edema, no cyanosis, no clubbing  Skin: No rashes, lesions or ulcers Psychiatry: . Mood & affect appropriate.     Data Reviewed: I have personally reviewed following labs and imaging studies  CBC: Recent Labs  Lab 02/22/20 1220 02/23/20 0403 02/24/20 0444 02/25/20 0339  WBC 8.1 6.2 6.3 7.3  NEUTROABS 7.3  --   --   --   HGB 11.0* 9.7* 9.6* 9.7*  HCT 34.5* 29.5* 30.3* 30.2*  MCV 86.5 84.3 83.9 85.3  PLT 203 148* 171 209   Basic Metabolic Panel: Recent Labs  Lab 02/22/20 1735 02/22/20 2350 02/23/20 0403 02/24/20 0444 02/25/20 0339  NA 136 138 136 143 145  K 4.2 4.3 4.1 3.8 3.7  CL 104 105 107 112* 112*  CO2 17* 19* 21* 22 23  GLUCOSE 332* 91 174* 205* 169*  BUN 60* 62* 64* 65* 58*  CREATININE 3.57* 3.39* 3.52* 2.90* 2.48*  CALCIUM 8.6* 8.7* 8.1* 8.7* 8.9  MG  --   --   --  2.4 2.5*  PHOS  --   --   --  2.7 2.0*   GFR: Estimated Creatinine Clearance: 21.7 mL/min (A) (by C-G formula based on SCr of 2.48 mg/dL (H)). Liver Function Tests: Recent Labs  Lab 02/22/20 1220 02/23/20 0403 02/24/20 0444  AST 154* 114* 61*  ALT 55* 43 40  ALKPHOS 79 48 47   BILITOT 0.9 0.6 0.6  PROT 7.1 5.4* 5.5*  ALBUMIN 3.5 2.5* 2.2*   No results for input(s): LIPASE, AMYLASE in the last 168 hours. No results for input(s): AMMONIA in the last 168 hours. Coagulation Profile: Recent Labs  Lab 02/22/20 1220 02/23/20 0403  INR 1.3* 1.3*   Cardiac Enzymes: No results for input(s): CKTOTAL, CKMB, CKMBINDEX, TROPONINI in the last 168 hours. BNP (last 3 results) No results for input(s): PROBNP in the last 8760 hours. HbA1C: No results for input(s): HGBA1C in the last 72 hours. CBG: Recent Labs  Lab 02/24/20 1946 02/24/20 2358 02/25/20 0408 02/25/20 0747 02/25/20 1143  GLUCAP 265* 249* 155* 196* 274*   Lipid Profile: No results for input(s): CHOL, HDL, LDLCALC, TRIG, CHOLHDL, LDLDIRECT in the last 72 hours. Thyroid Function Tests: No results for input(s): TSH, T4TOTAL, FREET4, T3FREE, THYROIDAB in the last 72 hours. Anemia Panel: No results for input(s): VITAMINB12, FOLATE, FERRITIN, TIBC, IRON, RETICCTPCT in the last 72 hours. Sepsis Labs: Recent Labs  Lab 02/22/20 1220 02/22/20 1343 02/23/20 0403 02/24/20 0444 02/24/20 0935  PROCALCITON  --   --  >150.00  --   --   LATICACIDVEN 5.1* 3.5*  --  2.1* 2.3*    Recent Results (from the past 240 hour(s))  Urine culture     Status: Abnormal   Collection Time: 02/22/20 12:05 PM   Specimen: In/Out Cath Urine  Result Value Ref Range Status   Specimen Description   Final    IN/OUT CATH URINE Performed at [facility], [address] [place], [place] [address]    Special Requests   Final    NONE Performed at [facility], [address], [place], [place] [address]    Culture 20,000 COLONIES/mL PSEUDOMONAS AERUGINOSA (A)  Final   Report Status 02/25/2020 FINAL  Final   Organism ID, Bacteria PSEUDOMONAS AERUGINOSA (A)  Final      Susceptibility   Pseudomonas aeruginosa - MIC*    CEFTAZIDIME 4 SENSITIVE Sensitive     CIPROFLOXACIN <=0.25 SENSITIVE Sensitive     GENTAMICIN <=1 SENSITIVE  Sensitive     IMIPENEM 2 SENSITIVE Sensitive  PIP/TAZO 8 SENSITIVE Sensitive     CEFEPIME 2 SENSITIVE Sensitive     * 20,000 COLONIES/mL PSEUDOMONAS AERUGINOSA  Respiratory Panel by RT PCR (Flu A&B, Covid) - Nasopharyngeal Swab     Status: None   Collection Time: 02/22/20 12:10 PM   Specimen: Nasopharyngeal Swab  Result Value Ref Range Status   SARS Coronavirus 2 by RT PCR NEGATIVE NEGATIVE Final    Comment: (NOTE) SARS-CoV-2 target nucleic acids are NOT DETECTED.  The SARS-CoV-2 RNA is generally detectable in upper respiratoy specimens during the acute phase of infection. The lowest concentration of SARS-CoV-2 viral copies this assay can detect is 131 copies/mL. A negative result does not preclude SARS-Cov-2 infection and should not be used as the sole basis for treatment or other patient management decisions. A negative result may occur with  improper specimen collection/handling, submission of specimen other than nasopharyngeal swab, presence of viral mutation(s) within the areas targeted by this assay, and inadequate number of viral copies (<131 copies/mL). A negative result must be combined with clinical observations, patient history, and epidemiological information. The expected result is Negative.  Fact Sheet for Patients:  PinkCheek.be  Fact Sheet for Healthcare Providers:  GravelBags.it  This test is no t yet approved or cleared by the [place] FDA and  has been authorized for detection and/or diagnosis of SARS-CoV-2 by FDA under an Emergency Use Authorization (EUA). This EUA will remain  in effect (meaning this test can be used) for the duration of the COVID-19 declaration under Section 564(b)(1) of the Act, 21 U.S.C. section 360bbb-3(b)(1), unless the authorization is terminated or revoked sooner.     Influenza A by PCR NEGATIVE NEGATIVE Final   Influenza B by PCR NEGATIVE NEGATIVE Final    Comment:  (NOTE) The Xpert Xpress SARS-CoV-2/FLU/RSV assay is intended as an aid in  the diagnosis of influenza from Nasopharyngeal swab specimens and  should not be used as a sole basis for treatment. Nasal washings and  aspirates are unacceptable for Xpert Xpress SARS-CoV-2/FLU/RSV  testing.  Fact Sheet for Patients: PinkCheek.be  Fact Sheet for Healthcare Providers: GravelBags.it  This test is not yet approved or cleared by the [place] FDA and  has been authorized for detection and/or diagnosis of SARS-CoV-2 by  FDA under an Emergency Use Authorization (EUA). This EUA will remain  in effect (meaning this test can be used) for the duration of the  Covid-19 declaration under Section 564(b)(1) of the Act, 21  U.S.C. section 360bbb-3(b)(1), unless the authorization is  terminated or revoked. Performed at [facility], [address], [place], [place] [address]   Blood Culture (routine x 2)     Status: Abnormal   Collection Time: 02/22/20 12:20 PM   Specimen: BLOOD LEFT HAND  Result Value Ref Range Status   Specimen Description   Final    BLOOD LEFT HAND BOTTLES DRAWN AEROBIC AND ANAEROBIC Performed at [facility], [address] [place], [place] [address]    Special Requests   Final    Blood Culture adequate volume Performed at [facility], [address], [place], [place] [address]    Culture  Setup Time   Final    GRAM NEGATIVE RODS Gram Stain Report Called to,Read Back By and Verified With: DANIELS,J @ 5993 ON 02/23/20 BY JUW AEROBIC BOTTLE ONLY GS DONE @ [facility] Performed at [facility], [address], [place], [place] [address]    Culture (A)  Final    PSEUDOMONAS AERUGINOSA SUSCEPTIBILITIES PERFORMED ON PREVIOUS  CULTURE WITHIN THE LAST 5 DAYS. Performed at [facility], [place] [address], [place], [place] [address]    Report Status 02/25/2020 FINAL  Final  Blood Culture (routine x 2)     Status: Abnormal    Collection Time: 02/22/20 12:20 PM   Specimen: Right Antecubital; Blood  Result Value Ref Range Status   Specimen Description   Final    RIGHT ANTECUBITAL BOTTLES DRAWN AEROBIC AND ANAEROBIC Performed at [facility], [address] [place], [place] [address]    Special Requests   Final    Blood Culture adequate volume Performed at [facility], [address], [place],  [address]    Culture  Setup Time   Final    GRAM NEGATIVE RODS Gram Stain Report Called to,Read Back By and Verified With: DANIELS,J @ 9983 ON 02/23/20 BY JUW AEROBIC BOTTLE ONLY GS DONE @ [facility]    Culture PSEUDOMONAS AERUGINOSA (A)  Final   Report Status 02/25/2020 FINAL  Final   Organism ID, Bacteria PSEUDOMONAS AERUGINOSA  Final      Susceptibility   Pseudomonas aeruginosa - MIC*    CEFTAZIDIME 4 SENSITIVE Sensitive     CIPROFLOXACIN <=0.25 SENSITIVE Sensitive     GENTAMICIN <=1 SENSITIVE Sensitive     IMIPENEM 2 SENSITIVE Sensitive     PIP/TAZO 8 SENSITIVE Sensitive     CEFEPIME 2 SENSITIVE Sensitive     * PSEUDOMONAS AERUGINOSA  Blood Culture ID Panel (Reflexed)     Status: Abnormal   Collection Time: 02/22/20 12:20 PM  Result Value Ref Range Status   Enterococcus faecalis NOT DETECTED NOT DETECTED Final   Enterococcus Faecium NOT DETECTED NOT DETECTED Final   Listeria monocytogenes NOT DETECTED NOT DETECTED Final   Staphylococcus species NOT DETECTED NOT DETECTED Final   Staphylococcus aureus (BCID) NOT DETECTED NOT DETECTED Final   Staphylococcus epidermidis NOT DETECTED NOT DETECTED Final   Staphylococcus lugdunensis NOT DETECTED NOT DETECTED Final   Streptococcus species NOT DETECTED NOT DETECTED Final   Streptococcus agalactiae NOT DETECTED NOT DETECTED Final   Streptococcus pneumoniae NOT DETECTED NOT DETECTED Final   Streptococcus pyogenes NOT DETECTED NOT DETECTED Final   A.calcoaceticus-baumannii NOT DETECTED NOT DETECTED Final   Bacteroides fragilis NOT DETECTED NOT DETECTED Final    Enterobacterales NOT DETECTED NOT DETECTED Final   Enterobacter cloacae complex NOT DETECTED NOT DETECTED Final   Escherichia coli NOT DETECTED NOT DETECTED Final   Klebsiella aerogenes NOT DETECTED NOT DETECTED Final   Klebsiella oxytoca NOT DETECTED NOT DETECTED Final   Klebsiella pneumoniae NOT DETECTED NOT DETECTED Final   Proteus species NOT DETECTED NOT DETECTED Final   Salmonella species NOT DETECTED NOT DETECTED Final   Serratia marcescens NOT DETECTED NOT DETECTED Final   Haemophilus influenzae NOT DETECTED NOT DETECTED Final   Neisseria meningitidis NOT DETECTED NOT DETECTED Final   Pseudomonas aeruginosa DETECTED (A) NOT DETECTED Final    Comment: CRITICAL RESULT CALLED TO, READ BACK BY AND VERIFIED WITH: Hyman Hopes RN, AT 1245 02/23/20 BY D. VANHOOK    Stenotrophomonas maltophilia NOT DETECTED NOT DETECTED Final   Candida albicans NOT DETECTED NOT DETECTED Final   Candida auris NOT DETECTED NOT DETECTED Final   Candida glabrata NOT DETECTED NOT DETECTED Final   Candida krusei NOT DETECTED NOT DETECTED Final   Candida parapsilosis NOT DETECTED NOT DETECTED Final   Candida tropicalis NOT DETECTED NOT DETECTED Final   Cryptococcus neoformans/gattii NOT DETECTED NOT DETECTED Final   CTX-M ESBL NOT DETECTED NOT  DETECTED Final   Carbapenem resistance IMP NOT DETECTED NOT DETECTED Final   Carbapenem resistance KPC NOT DETECTED NOT DETECTED Final   Carbapenem resistance NDM NOT DETECTED NOT DETECTED Final   Carbapenem resistance VIM NOT DETECTED NOT DETECTED Final    Comment: Performed at [facility], [place] [address] [place], [place] [address]  MRSA PCR Screening     Status: None   Collection Time: 02/22/20 12:28 PM   Specimen: Nasopharyngeal  Result Value Ref Range Status   MRSA by PCR NEGATIVE NEGATIVE Final    Comment:        The GeneXpert MRSA Assay (FDA approved for NASAL specimens only), is one component of a comprehensive MRSA colonization surveillance  program. It is not intended to diagnose MRSA infection nor to guide or monitor treatment for MRSA infections. Performed at [facility], [address] [place], [place] [address]     Radiology Studies: No results found. Scheduled Meds: . busPIRone  5 mg Oral BID  . Chlorhexidine Gluconate Cloth  6 each Topical Daily  . cloNIDine  0.1 mg Transdermal Weekly  . docusate sodium  100 mg Oral BID  . donepezil  5 mg Oral QHS  . enoxaparin (LOVENOX) injection  30 mg Subcutaneous Q24H  . furosemide  40 mg Oral Daily  . hydrALAZINE  50 mg Oral TID  . insulin aspart  0-6 Units Subcutaneous Q4H  . insulin detemir  16 Units Subcutaneous Daily  . mouth rinse  15 mL Mouth Rinse BID  . metoprolol tartrate  12.5 mg Oral BID  . senna  1 tablet Oral BID  . sodium chloride flush  3 mL Intravenous Q12H   Continuous Infusions: . ceFEPime (MAXIPIME) IV Stopped (02/25/20 1539)  . dextrose 5% lactated ringers 75 mL/hr at 02/25/20 1600  . lactated ringers    . lactated ringers Stopped (02/22/20 1848)     LOS: 3 days    Time spent: 35 mins.    Shawna Clamp, MD [facility]   If 7PM-7AM, please contact night-coverage

## 2020-02-25 NOTE — Progress Notes (Signed)
After SLP consult today patient was transitioned to Dysphagia 3 diet with thin liquids. I was feeding the patient his dinner when about halfway through his heart rate increased to 153 and blood pressure increased to 238/76 with monitor reading afib. Patient became extremely anxious and irritable. When asked if his chest hurt he responded yes and stated "I need to get out of this hole". Hydralazine 10 mg IV was given at 1815 and Dr. Dwyane Dee was notified. Per order I then administered 5 mg Metoprolol IV at 1818. Shortly after the patient's heart rate decreased to 79 and blood pressure decreased to 146/62 with monitor reading sinus rhythm. Patient is currently back at baseline and I will continue to monitor. Per Dr. Dwyane Dee the patient will be placed back on NPO until further evaluation can be done.

## 2020-02-25 NOTE — Progress Notes (Signed)
Progress Note  Patient Name: Paul Perez Date of Encounter: 02/25/2020  Primary Cardiologist: Rozann Lesches, MD  Subjective   No specific complaints this morning.  More calm per nursing.  Inpatient Medications    Scheduled Meds: . busPIRone  5 mg Oral BID  . Chlorhexidine Gluconate Cloth  6 each Topical Daily  . cloNIDine  0.1 mg Transdermal Weekly  . docusate sodium  100 mg Oral BID  . donepezil  5 mg Oral QHS  . enoxaparin (LOVENOX) injection  30 mg Subcutaneous Q24H  . furosemide  40 mg Oral Daily  . hydrALAZINE  50 mg Oral TID  . insulin aspart  0-6 Units Subcutaneous Q4H  . insulin detemir  16 Units Subcutaneous Daily  . mouth rinse  15 mL Mouth Rinse BID  . metoprolol tartrate  12.5 mg Oral BID  . senna  1 tablet Oral BID  . sodium chloride flush  3 mL Intravenous Q12H   Continuous Infusions: . ceFEPime (MAXIPIME) IV Stopped (02/24/20 1543)  . dextrose 5% lactated ringers 75 mL/hr at 02/25/20 0642  . lactated ringers    . lactated ringers Stopped (02/22/20 1848)   PRN Meds: acetaminophen **OR** acetaminophen, dextrose, hydrALAZINE, ondansetron **OR** ondansetron (ZOFRAN) IV   Vital Signs    Vitals:   02/25/20 0434 02/25/20 0500 02/25/20 0600 02/25/20 0749  BP:  (!) 184/67 (!) 186/63   Pulse:  68 65   Resp:  17 19   Temp: 98.1 F (36.7 C)   99.5 F (37.5 C)  TempSrc: Oral   Rectal  SpO2:  98% 99%   Weight: 87.1 kg     Height:        Intake/Output Summary (Last 24 hours) at 02/25/2020 0905 Last data filed at 02/25/2020 9622 Gross per 24 hour  Intake 2641.85 ml  Output 1450 ml  Net 1191.85 ml   Filed Weights   02/22/20 2345 02/24/20 0500 02/25/20 0434  Weight: 81.1 kg 84.9 kg 87.1 kg    Telemetry    Sinus rhythm.  Personally reviewed.  ECG    An ECG dated 02/22/2020 was personally reviewed today and demonstrated:  Sinus rhythm with LVH and repolarization abnormalities, biatrial enlargement.  Physical Exam   GEN:  Elderly male.   No acute distress.   Neck: No JVD. Cardiac: RRR, soft systolic murmur, no gallop.  Respiratory: Nonlabored. Clear to auscultation bilaterally. GI: Soft, nontender, bowel sounds present. MS: No edema. Psych: Alert and oriented x 1.  Calm.  Labs    Chemistry Recent Labs  Lab 02/22/20 1220 02/22/20 1735 02/23/20 0403 02/24/20 0444 02/25/20 0339  NA 127*   < > 136 143 145  K 5.1   < > 4.1 3.8 3.7  CL 91*   < > 107 112* 112*  CO2 19*   < > 21* 22 23  GLUCOSE 802*   < > 174* 205* 169*  BUN 62*   < > 64* 65* 58*  CREATININE 3.79*   < > 3.52* 2.90* 2.48*  CALCIUM 9.1   < > 8.1* 8.7* 8.9  PROT 7.1  --  5.4* 5.5*  --   ALBUMIN 3.5  --  2.5* 2.2*  --   AST 154*  --  114* 61*  --   ALT 55*  --  43 40  --   ALKPHOS 79  --  48 47  --   BILITOT 0.9  --  0.6 0.6  --   GFRNONAA 13*   < >  14* 18* 22*  ANIONGAP 17*   < > 8 9 10    < > = values in this interval not displayed.     Hematology Recent Labs  Lab 02/23/20 0403 02/24/20 0444 02/25/20 0339  WBC 6.2 6.3 7.3  RBC 3.50* 3.61* 3.54*  HGB 9.7* 9.6* 9.7*  HCT 29.5* 30.3* 30.2*  MCV 84.3 83.9 85.3  MCH 27.7 26.6 27.4  MCHC 32.9 31.7 32.1  RDW 13.1 13.1 13.5  PLT 148* 171 176    Radiology    US Abdomen Limited RUQ  Result Date: 02/23/2020 CLINICAL DATA:  Elevated liver enzymes EXAM: ULTRASOUND ABDOMEN LIMITED RIGHT UPPER QUADRANT COMPARISON:  August 25, 2017 FINDINGS: Gallbladder: No wall thickening visualized. Gallstones are identified. 0.3 cm polyp is identified. No sonographic Murphy sign noted by sonographer. Common bile duct: Diameter: 0.3 mm Liver: 1.5 x 1.1 x 1.4 cm cyst is identified in the left lobe liver. Within normal limits in parenchymal echogenicity. Portal vein is patent on color Doppler imaging with normal direction of blood flow towards the liver. Other: None. IMPRESSION: 1. Cholelithiasis without sonographic evidence of acute cholecystitis. 2. 1.5 cm cyst in the left lobe liver. Electronically Signed   By:  Abelardo Diesel M.D.   On: 02/23/2020 14:49   Patient Profile     86 y.o. male with a history of dementia, hypertension, upper GI bleed, type 2 diabetes mellitus, previously documented bradycardia in the setting of rhabdomyolysis, currently admitted with Pseudomonas bacteremia sepsis complicated by electrolyte abnormalities, acute on chronic renal failure, and DKA.  Cardiology consulted for transient episode of atrial fibrillation with RVR.  Assessment & Plan    1.  Transient episode of atrial fibrillation with RVR, noted during swallowing assessment and agitation.  Patient spontaneously converted to sinus rhythm with IV Lopressor and remains in sinus rhythm this morning.  2.  Dementia, on Aricept.  3.  Essential hypertension, on hydralazine and clonidine.  Systolics ranging from 314-970.  4.  Acute on chronic renal failure with CKD stage IIIb at baseline.  Creatinine down to 2.9.  5.  Sepsis due to Pseudomonas bacteremia, currently on cefepime per primary team.  I reviewed hospital course and consultation note from Dr. Harrington Challenger. Patient was placed on standing IV Lopressor 5 mg every 6 hours.  He is taking oral medications following swallowing assessment.  No further atrial fibrillation documented.  CHA2DS2-VASc score is 4.  At this point, do not plan to commit him to oral anticoagulation going forward.  Stop IV Lopressor and initiate oral Lopressor 12.5 mg twice daily.  Signed, Rozann Lesches, MD  02/25/2020, 9:05 AM

## 2020-02-25 NOTE — Progress Notes (Signed)
Inpatient Diabetes Program Recommendations  AACE/ADA: New Consensus Statement on Inpatient Glycemic Control   Target Ranges:  Prepandial:   less than 140 mg/dL      Peak postprandial:   less than 180 mg/dL (1-2 hours)      Critically ill patients:  140 - 180 mg/dL     Review of Glycemic Control Results for Paul Perez, Paul Perez "ED" (MRN [id number]) as of 02/25/2020 11:08  Ref. Range 02/24/2020 07:24 02/24/2020 11:10 02/24/2020 16:23 02/24/2020 19:46 02/24/2020 23:58 02/25/2020 04:08 02/25/2020 07:47  Glucose-Capillary Latest Ref Range: 70 - 99 mg/dL 192 (H) 250 (H) 253 (H) 265 (H) 249 (H) 155 (H) 196 (H)   Diabetes history: DM2 Outpatient Diabetes medications: Actos 15 mg daily Current orders for Inpatient glycemic control: Levemir 16 units daily, Novolog 0-6 units Q4H  Inpatient Diabetes Program Recommendations:    -  Consider increasing Levemir to 18 units  May can also keep pt on Actos at time of d/c to cover daytime glucose.  Thanks, Tama Headings RN, MSN, BC-ADM Inpatient Diabetes Coordinator Team Pager [phone] (8a-5p)

## 2020-02-25 NOTE — Progress Notes (Addendum)
Pharmacy Antibiotic Note  Paul Perez is a 86 y.o. diabetic male admitted on 02/22/2020 with  sepsis.  Pharmacy has been consulted for cefepime  dosing.  Patient has  CKD stage 3. Patient with urosepsis, BCX are Positive for Pseudomonas Aero. And is pan sensitive.  Plan: Continue cefepime 2g IV q24h Pharmacy will continue to monitor renal function,  cultures and patient progress.  Height: 6' (182.9 cm) Weight: 87.1 kg (192 lb 0.3 oz) IBW/kg (Calculated) : 77.6  Temp (24hrs), Avg:99 F (37.2 C), Min:98.1 F (36.7 C), Max:99.7 F (37.6 C)  Recent Labs  Lab 02/22/20 1220 02/22/20 1220 02/22/20 1343 02/22/20 1735 02/22/20 2350 02/23/20 0403 02/24/20 0444 02/24/20 0935 02/25/20 0339  WBC 8.1  --   --   --   --  6.2 6.3  --  7.3  CREATININE 3.79*   < >  --  3.57* 3.39* 3.52* 2.90*  --  2.48*  LATICACIDVEN 5.1*  --  3.5*  --   --   --  2.1* 2.3*  --    < > = values in this interval not displayed.    Estimated Creatinine Clearance: 21.7 mL/min (A) (by C-G formula based on SCr of 2.48 mg/dL (H)).     Antimicrobials this admission: cefepime 10/9 >>   vancomycin 10/9 >> 10/10 metronidazole 10/9>>10/11  Dose adjustments this admission: cefepime  Microbiology results: 10/9 BC x2: Pseudomonas aero in both bottles 10/9 BCID panel: P. aeruginosa 10/9 UCx:  >100,000 CFU/ml Pseudomonas aero.   10/1 UCx: >100,000 E. Coli (pan sens) 10/9  MRSA PCR:  negative 10/9 Resp PCR: SARS CoV-2 negative; Flu A/B negative  Thank you for allowing pharmacy to be a part of this patient's care.  Isac Sarna, BS [place], [place] Clinical Pharmacist Pager [phone] 02/25/2020 12:15 PM

## 2020-02-26 ENCOUNTER — Inpatient Hospital Stay (HOSPITAL_COMMUNITY): Payer: Medicare Other

## 2020-02-26 DIAGNOSIS — I480 Paroxysmal atrial fibrillation: Secondary | ICD-10-CM | POA: Diagnosis not present

## 2020-02-26 DIAGNOSIS — E118 Type 2 diabetes mellitus with unspecified complications: Secondary | ICD-10-CM

## 2020-02-26 DIAGNOSIS — G9341 Metabolic encephalopathy: Secondary | ICD-10-CM | POA: Diagnosis not present

## 2020-02-26 DIAGNOSIS — R7881 Bacteremia: Secondary | ICD-10-CM | POA: Diagnosis not present

## 2020-02-26 DIAGNOSIS — B965 Pseudomonas (aeruginosa) (mallei) (pseudomallei) as the cause of diseases classified elsewhere: Secondary | ICD-10-CM | POA: Diagnosis present

## 2020-02-26 DIAGNOSIS — A419 Sepsis, unspecified organism: Secondary | ICD-10-CM | POA: Diagnosis not present

## 2020-02-26 DIAGNOSIS — E1165 Type 2 diabetes mellitus with hyperglycemia: Secondary | ICD-10-CM

## 2020-02-26 LAB — COMPREHENSIVE METABOLIC PANEL
ALT: 29 U/L (ref 0–44)
AST: 21 U/L (ref 15–41)
Albumin: 2.2 g/dL — ABNORMAL LOW (ref 3.5–5.0)
Alkaline Phosphatase: 54 U/L (ref 38–126)
Anion gap: 10 (ref 5–15)
BUN: 62 mg/dL — ABNORMAL HIGH (ref 8–23)
CO2: 22 mmol/L (ref 22–32)
Calcium: 8.9 mg/dL (ref 8.9–10.3)
Chloride: 113 mmol/L — ABNORMAL HIGH (ref 98–111)
Creatinine, Ser: 2.43 mg/dL — ABNORMAL HIGH (ref 0.61–1.24)
GFR, Estimated: 23 mL/min — ABNORMAL LOW (ref >60)
Glucose, Bld: 251 mg/dL — ABNORMAL HIGH (ref 70–99)
Potassium: 3.7 mmol/L (ref 3.5–5.1)
Sodium: 145 mmol/L (ref 135–145)
Total Bilirubin: 0.8 mg/dL (ref 0.3–1.2)
Total Protein: 5.7 g/dL — ABNORMAL LOW (ref 6.5–8.1)

## 2020-02-26 LAB — CBC
HCT: 29.2 % — ABNORMAL LOW (ref 39.0–52.0)
Hemoglobin: 9.5 g/dL — ABNORMAL LOW (ref 13.0–17.0)
MCH: 27.4 pg (ref 26.0–34.0)
MCHC: 32.5 g/dL (ref 30.0–36.0)
MCV: 84.1 fL (ref 80.0–100.0)
Platelets: 169 10*3/uL (ref 150–400)
RBC: 3.47 MIL/uL — ABNORMAL LOW (ref 4.22–5.81)
RDW: 13.8 % (ref 11.5–15.5)
WBC: 8.5 10*3/uL (ref 4.0–10.5)
nRBC: 0 % (ref 0.0–0.2)

## 2020-02-26 LAB — GLUCOSE, CAPILLARY
Glucose-Capillary: 142 mg/dL — ABNORMAL HIGH (ref 70–99)
Glucose-Capillary: 188 mg/dL — ABNORMAL HIGH (ref 70–99)
Glucose-Capillary: 201 mg/dL — ABNORMAL HIGH (ref 70–99)
Glucose-Capillary: 229 mg/dL — ABNORMAL HIGH (ref 70–99)
Glucose-Capillary: 235 mg/dL — ABNORMAL HIGH (ref 70–99)
Glucose-Capillary: 253 mg/dL — ABNORMAL HIGH (ref 70–99)

## 2020-02-26 LAB — MAGNESIUM: Magnesium: 2.4 mg/dL (ref 1.7–2.4)

## 2020-02-26 LAB — PHOSPHORUS: Phosphorus: 1.4 mg/dL — ABNORMAL LOW (ref 2.5–4.6)

## 2020-02-26 MED ORDER — INSULIN ASPART 100 UNIT/ML ~~LOC~~ SOLN
0.0000 [IU] | Freq: Three times a day (TID) | SUBCUTANEOUS | Status: DC
  Administered 2020-02-26 (×2): 2 [IU] via SUBCUTANEOUS
  Administered 2020-02-27: 3 [IU] via SUBCUTANEOUS
  Administered 2020-02-27: 5 [IU] via SUBCUTANEOUS

## 2020-02-26 MED ORDER — DEXTROSE IN LACTATED RINGERS 5 % IV SOLN: INTRAVENOUS | Status: DC

## 2020-02-26 MED ORDER — INSULIN ASPART 100 UNIT/ML ~~LOC~~ SOLN
2.0000 [IU] | Freq: Three times a day (TID) | SUBCUTANEOUS | Status: DC
  Administered 2020-02-26 – 2020-02-27 (×3): 2 [IU] via SUBCUTANEOUS

## 2020-02-26 MED ORDER — INSULIN DETEMIR 100 UNIT/ML ~~LOC~~ SOLN
18.0000 [IU] | Freq: Every day | SUBCUTANEOUS | Status: DC
  Administered 2020-02-26 – 2020-02-28 (×3): 18 [IU] via SUBCUTANEOUS
  Filled 2020-02-26 (×6): qty 0.18

## 2020-02-26 MED ORDER — HYDRALAZINE HCL 25 MG PO TABS
75.0000 mg | ORAL_TABLET | Freq: Three times a day (TID) | ORAL | Status: DC
  Administered 2020-02-26 – 2020-03-04 (×21): 75 mg via ORAL
  Filled 2020-02-26 (×23): qty 3

## 2020-02-26 MED ORDER — MELATONIN 3 MG PO TABS
3.0000 mg | ORAL_TABLET | Freq: Every evening | ORAL | Status: DC | PRN
  Administered 2020-02-26 – 2020-03-05 (×5): 3 mg via ORAL
  Filled 2020-02-26 (×5): qty 1

## 2020-02-26 NOTE — Evaluation (Signed)
Modified Barium Swallow Progress Note  Patient Details  Name: Paul Perez MRN: [id number] Date of Birth: Jan 22, 1928  Today's Date: 02/26/2020  Modified Barium Swallow completed.  Full report located under Chart Review in the Imaging Section.  Brief recommendations include the following:  Clinical Impression  Pt presents with mild oropharyngeal sensorimotor dysphagia characterized by decreased laryngeal vestibule closure resulting in two episodes of trace silent aspiration of thin liquids via straw and consistent penetration of thin liquids and NTL during the swallow that is usually expelled with repeat swallow and/or additional swallow of solid textures. Decreased pharyngeal constriction resulting in inconsistent clearance of penetrates however despite remaining in the airway, they were only visualized dropping to and below the cords twice as mentioned above. Solid textures were consumed without incident and the barium tablet was swallowed with thin liquids with noted brief stasis in the pyriforms but quickly passed through. RN was present for MBS and BP and HR were stable throughout procedure. Recommend D3/mech soft and Nectar thick liquids; recommend meds whole or crushed in puree as Pt is able. Also recommend free water protocol, * single ice chips after oral care in between meals *. ST will continue to follow acutely.   Swallow Evaluation Recommendations       SLP Diet Recommendations: Dysphagia 3 (Mech soft) solids;Nectar thick liquid   Liquid Administration via: Cup;No straw   Medication Administration: Whole meds with puree   Supervision: Full assist for feeding;Full supervision/cueing for compensatory strategies   Compensations: Slow rate;Small sips/bites   Postural Changes: Remain semi-upright after after feeds/meals (Comment);Seated upright at 90 degrees   Oral Care Recommendations: Oral care BID   Other Recommendations: Order thickener from [place].  Roddie Mc, CCC-SLP Speech Language Pathologist   Wende Bushy 02/26/2020,4:47 PM

## 2020-02-26 NOTE — Progress Notes (Signed)
Progress Note  Patient Name: Paul Perez Date of Encounter: 02/26/2020  Primary Cardiologist: Rozann Lesches, MD  Subjective   Episode of transient atrial fibrillation with RVR again noted yesterday while eating, patient became anxious and irritable during the event, spontaneously reverted to sinus rhythm with IV metoprolol once again.  Complaining of pain during PT evaluation this morning.  Inpatient Medications    Scheduled Meds: . busPIRone  5 mg Oral BID  . Chlorhexidine Gluconate Cloth  6 each Topical Daily  . cloNIDine  0.1 mg Transdermal Weekly  . docusate sodium  100 mg Oral BID  . donepezil  5 mg Oral QHS  . enoxaparin (LOVENOX) injection  30 mg Subcutaneous Q24H  . furosemide  40 mg Oral Daily  . hydrALAZINE  50 mg Oral TID  . influenza vaccine adjuvanted  0.5 mL Intramuscular Tomorrow-1000  . insulin aspart  0-6 Units Subcutaneous Q4H  . insulin detemir  16 Units Subcutaneous Daily  . mouth rinse  15 mL Mouth Rinse BID  . metoprolol tartrate  12.5 mg Oral BID  . senna  1 tablet Oral BID  . sodium chloride flush  3 mL Intravenous Q12H   Continuous Infusions: . ceFEPime (MAXIPIME) IV Stopped (02/25/20 1539)  . dextrose 5% lactated ringers 75 mL/hr at 02/25/20 2312  . lactated ringers    . lactated ringers Stopped (02/22/20 1848)   PRN Meds: acetaminophen **OR** acetaminophen, dextrose, hydrALAZINE, ondansetron **OR** ondansetron (ZOFRAN) IV   Vital Signs    Vitals:   02/26/20 0300 02/26/20 0404 02/26/20 0506 02/26/20 0602  BP: (!) 184/76  (!) 199/65 (!) 175/54  Pulse: 77  88 72  Resp: 18  (!) 23 15  Temp:  99.3 F (37.4 C)    TempSrc:  Oral    SpO2: 98%  98% 98%  Weight:  86.9 kg    Height:        Intake/Output Summary (Last 24 hours) at 02/26/2020 0819 Last data filed at 02/26/2020 0556 Gross per 24 hour  Intake 1655.99 ml  Output 1750 ml  Net -94.01 ml   Filed Weights   02/24/20 0500 02/25/20 0434 02/26/20 0404  Weight: 84.9 kg 87.1  kg 86.9 kg    Telemetry    Sinus rhythm.  Personally reviewed.  ECG    No repeat tracing reviewed today.  Physical Exam   GEN:  Elderly male.  Neck: No JVD. Cardiac: RRR, soft systolic murmur, no gallop.  Respiratory: Nonlabored. Clear to auscultation bilaterally. GI: Soft, nontender, bowel sounds present. MS: No edema. Psych: Alert and oriented x 1.  Labs    Chemistry Recent Labs  Lab 02/23/20 0403 02/23/20 0403 02/24/20 0444 02/25/20 0339 02/26/20 0334  NA 136   < > 143 145 145  K 4.1   < > 3.8 3.7 3.7  CL 107   < > 112* 112* 113*  CO2 21*   < > 22 23 22   GLUCOSE 174*   < > 205* 169* 251*  BUN 64*   < > 65* 58* 62*  CREATININE 3.52*   < > 2.90* 2.48* 2.43*  CALCIUM 8.1*   < > 8.7* 8.9 8.9  PROT 5.4*  --  5.5*  --  5.7*  ALBUMIN 2.5*  --  2.2*  --  2.2*  AST 114*  --  61*  --  21  ALT 43  --  40  --  29  ALKPHOS 48  --  47  --  54  BILITOT 0.6  --  0.6  --  0.8  GFRNONAA 14*   < > 18* 22* 23*  ANIONGAP 8   < > 9 10 10    < > = values in this interval not displayed.     Hematology Recent Labs  Lab 02/24/20 0444 02/25/20 0339 02/26/20 0334  WBC 6.3 7.3 8.5  RBC 3.61* 3.54* 3.47*  HGB 9.6* 9.7* 9.5*  HCT 30.3* 30.2* 29.2*  MCV 83.9 85.3 84.1  MCH 26.6 27.4 27.4  MCHC 31.7 32.1 32.5  RDW 13.1 13.5 13.8  PLT 171 176 169    Patient Profile     86 y.o. male with a history of dementia, hypertension, upper GI bleed, type 2 diabetes mellitus, previously documented bradycardia in the setting of rhabdomyolysis, currently admitted with Pseudomonas bacteremia sepsis complicated by electrolyte abnormalities, acute on chronic renal failure, and DKA.  Cardiology consulted for transient episode of atrial fibrillation with RVR.  Assessment & Plan    1.  Transient episodes of atrial fibrillation with RVR, noted during swallowing assessment and eating late yesterday as well.  He has spontaneously converted to sinus rhythm with IV metoprolol.  Started on oral  Lopressor yesterday.  2.  Dementia, on Aricept.  3.  Essential hypertension, on hydralazine and clonidine.  Blood pressure has not been well controlled as yet.  4.  Acute on chronic renal failure with CKD stage IIIb at baseline.  Creatinine down to 2.43.  5.  Sepsis due to Pseudomonas bacteremia, currently on cefepime per primary team.  Trigger for his recurrent atrial fibrillation tends to be with eating, agitation with swallowing at times.  I am not certain how this should be addressed or whether he would be a candidate for alternative means of nutrition if he cannot be cleared by a full swallowing evaluation.  From a cardiac perspective, would continue oral Lopressor, I suppose oral amiodarone could be considered to perhaps suppress recurring atrial fibrillation if there are no other alternatives, but he is still not a good candidate for anticoagulation.  Increase hydralazine to 75 mg 3 times daily for better blood pressure control.  Signed, Rozann Lesches, MD  02/26/2020, 8:19 AM

## 2020-02-26 NOTE — Plan of Care (Signed)
  Problem: Acute Rehab PT Goals(only PT should resolve) Goal: Pt Will Go Supine/Side To Sit Outcome: Progressing Flowsheets (Taken 02/26/2020 1026) Pt will go Supine/Side to Sit:  with minimal assist  with moderate assist Goal: Patient Will Transfer Sit To/From Stand Outcome: Progressing Flowsheets (Taken 02/26/2020 1026) Patient will transfer sit to/from stand: with moderate assist Goal: Pt Will Transfer Bed To Chair/Chair To Bed Outcome: Progressing Flowsheets (Taken 02/26/2020 1026) Pt will Transfer Bed to Chair/Chair to Bed:  with mod assist  with max assist Goal: Pt Will Ambulate Outcome: Progressing Flowsheets (Taken 02/26/2020 1026) Pt will Ambulate:  10 feet  with moderate assist  with maximum assist  with rolling walker   10:26 AM, 02/26/20 Lonell Grandchild, MPT Physical Therapist with [facility] 336 [phone] office [phone] mobile phone

## 2020-02-26 NOTE — Progress Notes (Signed)
[facility]  PROGRESS NOTE  Paul Perez OMB:[id number] DOB: 03-05-1928 DOA: 02/22/2020 PCP: Alanson P[facility] date - 02/22/2020   Admitting Physician Shawna Clamp, MD  Outpatient Primary MD for the patient is [facility]  LOS - 4 Brief Narrative   Paul Perez is a 86 y.o. year old male with medical history significant for Beatties, dementia, HTN, CKDStage IV, PACs who presented on 2/9 with confusion and fever after recently being treated for UTI with Keflex and was found to have severe sepsis secondary to Pseudomonas bacteremia.  Hospital course complicated by DKA, paroxysmal atrial fibrillation, oropharyngeal dysphagia.  Subjective  Today he has no complaints.  States he has no pain anywhere.  A & P  Mild oropharyngeal dysphagia.  Underwent barium swallow on 10/13. -Speech recommends dysphagia 3 diet, closely monitor. -Obtain chest x-ray given cough, rule out aspiration pneumonia -Aspiration precautions  Paroxysmal atrial fibrillation with RVR, currently in normal sinus rhythm and rate controlled.  Occurred in setting of potential aspiration event. -Appreciate cardiology recommendations, continue oral Lopressor, not a candidate for anticoagulation  Hypertension, elevated SBP's in the 170s-180s -Increase hydralazine 75 mg 3 times daily -Clonidine patch, Lasix  Severe sepsis secondary to pseudomonal bacteremia.  Sepsis physiology resolved, remains afebrile -Continue IV cefepime for total of 7days, end date 10/17  DKA, resolved Type 2 diabetes, poorly controlled A1c greater than 15.  CBGs in the 250s -Increase Levemir to 18 units -NovoLog 2 units 3 times daily with meals -Sliding scale as needed with meals  Mood disorder, stable -BuSpar  Hyponatremia, resolved.  In setting of dehydration improved with IV fluids  Elevated liver enzymes, resolved.  Right upper quad ultrasound showed cholelithiasis.  CKD, stage IV, creatinine stable  at baseline.  Did have peak creatinine of 3.58 during hospitalization for which nephrology recommended continue supportive care -Avoid nephrotoxic agents  Acute encephalopathy, multifactorial (infectious, metabolic), waxes and wanes.  Alert to self and place this morning, now confused.  In setting of dementia for which baseline is able to have conversation per daughter.  Likely further complicated by hospital-acquired delirium -Delirium precautions -Expect improvement with treatment of medical issues -Continue home Aricept     Family Communication  : Spoke with daughter Paul Perez at bedside  Code Status : Full code, discussed patient's desire of advanced directives with Paul Perez who states he has no specific directives.  She has initiated conversations with her brothers regarding goals of care  Disposition Plan  :  Patient is from ALF. Anticipated d/c date: 2 to 3 days. Barriers to d/c or necessity for inpatient status:  Needs to continue IV cefepime, close monitoring of A. fib, close monitoring mental status, ability to tolerate oral Consults  : Cardiology  Procedures  : Barium swallow, 10/13  DVT Prophylaxis  :  Lovenox   MDM: The below labs and imaging reports were reviewed and summarized above.  Medication management as above.  Lab Results  Component Value Date   PLT 169 02/26/2020    Diet :  Diet Order            DIET DYS 3 Room service appropriate? Yes; Fluid consistency: Nectar Thick  Diet effective now                  Inpatient Medications Scheduled Meds: . busPIRone  5 mg Oral BID  . Chlorhexidine Gluconate Cloth  6 each Topical Daily  . cloNIDine  0.1 mg Transdermal Weekly  . docusate sodium  100 mg Oral BID  . donepezil  5 mg Oral QHS  . enoxaparin (LOVENOX) injection  30 mg Subcutaneous Q24H  . furosemide  40 mg Oral Daily  . hydrALAZINE  75 mg Oral TID  . insulin aspart  0-6 Units Subcutaneous TID WC  . insulin aspart  2 Units Subcutaneous TID WC  .  insulin detemir  18 Units Subcutaneous Daily  . mouth rinse  15 mL Mouth Rinse BID  . metoprolol tartrate  12.5 mg Oral BID  . senna  1 tablet Oral BID  . sodium chloride flush  3 mL Intravenous Q12H   Continuous Infusions: . ceFEPime (MAXIPIME) IV 2 g (02/26/20 1551)  . dextrose 5% lactated ringers 75 mL/hr at 02/26/20 1445  . lactated ringers     PRN Meds:.acetaminophen **OR** acetaminophen, dextrose, hydrALAZINE, ondansetron **OR** ondansetron (ZOFRAN) IV  Antibiotics  :   Anti-infectives (From admission, onward)   Start     Dose/Rate Route Frequency Ordered Stop   02/24/20 1300  vancomycin (VANCOCIN) IVPB 1000 mg/200 mL premix  Status:  Discontinued        1,000 mg 200 mL/hr over 60 Minutes Intravenous Every 48 hours 02/22/20 1331 02/24/20 0839   02/23/20 1500  ceFEPIme (MAXIPIME) 2 g in sodium chloride 0.9 % 100 mL IVPB        2 g 200 mL/hr over 30 Minutes Intravenous Every 24 hours 02/22/20 1457     02/22/20 2000  metroNIDAZOLE (FLAGYL) IVPB 500 mg  Status:  Discontinued        500 mg 100 mL/hr over 60 Minutes Intravenous Every 8 hours 02/22/20 1549 02/24/20 0839   02/22/20 1300  vancomycin (VANCOREADY) IVPB 2000 mg/400 mL        2,000 mg 200 mL/hr over 120 Minutes Intravenous  Once 02/22/20 1219 02/22/20 1545   02/22/20 1215  ceFEPIme (MAXIPIME) 2 g in sodium chloride 0.9 % 100 mL IVPB        2 g 200 mL/hr over 30 Minutes Intravenous  Once 02/22/20 1207 02/22/20 1406   02/22/20 1215  metroNIDAZOLE (FLAGYL) IVPB 500 mg        500 mg 100 mL/hr over 60 Minutes Intravenous  Once 02/22/20 1207 02/22/20 1406   02/22/20 1215  vancomycin (VANCOCIN) IVPB 1000 mg/200 mL premix  Status:  Discontinued        1,000 mg 200 mL/hr over 60 Minutes Intravenous  Once 02/22/20 1207 02/22/20 1219       Objective   Vitals:   02/26/20 1500 02/26/20 1600 02/26/20 1618 02/26/20 1643  BP:  (!) 191/106  (!) 183/71  Pulse: 64 77 75 73  Resp: 14 20 (!) 22 20  Temp:   97.6 F (36.4 C)     TempSrc:   Oral   SpO2: 97% 99% 98% 98%  Weight:      Height:        SpO2: 98 % O2 Flow Rate (L/min): 2 L/min  Wt Readings from Last 3 Encounters:  02/26/20 86.9 kg  02/17/20 90.7 kg  02/14/20 87.1 kg     Intake/Output Summary (Last 24 hours) at 02/26/2020 1712 Last data filed at 02/26/2020 1439 Gross per 24 hour  Intake 870.76 ml  Output 451 ml  Net 419.76 ml    Physical Exam:     Awake Alert, Oriented to self, pleasantly conversant, easily distractible No new F.N deficits,  Swelling in upper extremities with scattered bruising [place].AT, Normal respiratory effort on room air, crackles heard  at bases, CTAB RRR, SEM present, no peripheral edema noted   I have personally reviewed the following:   Data Reviewed:  CBC Recent Labs  Lab 02/22/20 1220 02/23/20 0403 02/24/20 0444 02/25/20 0339 02/26/20 0334  WBC 8.1 6.2 6.3 7.3 8.5  HGB 11.0* 9.7* 9.6* 9.7* 9.5*  HCT 34.5* 29.5* 30.3* 30.2* 29.2*  PLT 203 148* 171 176 169  MCV 86.5 84.3 83.9 85.3 84.1  MCH 27.6 27.7 26.6 27.4 27.4  MCHC 31.9 32.9 31.7 32.1 32.5  RDW 13.0 13.1 13.1 13.5 13.8  LYMPHSABS 0.4*  --   --   --   --   MONOABS 0.4  --   --   --   --   EOSABS 0.0  --   --   --   --   BASOSABS 0.0  --   --   --   --     Chemistries  Recent Labs  Lab 02/22/20 1220 02/22/20 1735 02/22/20 2350 02/23/20 0403 02/24/20 0444 02/25/20 0339 02/26/20 0334  NA 127*   < > 138 136 143 145 145  K 5.1   < > 4.3 4.1 3.8 3.7 3.7  CL 91*   < > 105 107 112* 112* 113*  CO2 19*   < > 19* 21* 22 23 22   GLUCOSE 802*   < > 91 174* 205* 169* 251*  BUN 62*   < > 62* 64* 65* 58* 62*  CREATININE 3.79*   < > 3.39* 3.52* 2.90* 2.48* 2.43*  CALCIUM 9.1   < > 8.7* 8.1* 8.7* 8.9 8.9  MG  --   --   --   --  2.4 2.5* 2.4  AST 154*  --   --  114* 61*  --  21  ALT 55*  --   --  43 40  --  29  ALKPHOS 79  --   --  48 47  --  54  BILITOT 0.9  --   --  0.6 0.6  --  0.8   < > = values in this interval not displayed.    ------------------------------------------------------------------------------------------------------------------ No results for input(s): CHOL, HDL, LDLCALC, TRIG, CHOLHDL, LDLDIRECT in the last 72 hours.  Lab Results  Component Value Date   HGBA1C >15.5 (H) 02/22/2020   ------------------------------------------------------------------------------------------------------------------ No results for input(s): TSH, T4TOTAL, T3FREE, THYROIDAB in the last 72 hours.  Invalid input(s): FREET3 ------------------------------------------------------------------------------------------------------------------ No results for input(s): VITAMINB12, FOLATE, FERRITIN, TIBC, IRON, RETICCTPCT in the last 72 hours.  Coagulation profile Recent Labs  Lab 02/22/20 1220 02/23/20 0403  INR 1.3* 1.3*    No results for input(s): DDIMER in the last 72 hours.  Cardiac Enzymes No results for input(s): CKMB, TROPONINI, MYOGLOBIN in the last 168 hours.  Invalid input(s): CK ------------------------------------------------------------------------------------------------------------------ No results found for: BNP  Micro Results Recent Results (from the past 240 hour(s))  Urine culture     Status: Abnormal   Collection Time: 02/22/20 12:05 PM   Specimen: In/Out Cath Urine  Result Value Ref Range Status   Specimen Description   Final    IN/OUT CATH URINE Performed at [facility], [address], [place], [place] [address]    Special Requests   Final    NONE Performed at [facility], [address], [place], [place] [address]    Culture 20,000 COLONIES/mL PSEUDOMONAS AERUGINOSA (A)  Final   Report Status 02/25/2020 FINAL  Final   Organism ID, Bacteria PSEUDOMONAS AERUGINOSA (A)  Final  Susceptibility   Pseudomonas aeruginosa - MIC*    CEFTAZIDIME 4 SENSITIVE Sensitive     CIPROFLOXACIN <=0.25 SENSITIVE Sensitive     GENTAMICIN <=1 SENSITIVE Sensitive     IMIPENEM 2 SENSITIVE Sensitive      PIP/TAZO 8 SENSITIVE Sensitive     CEFEPIME 2 SENSITIVE Sensitive     * 20,000 COLONIES/mL PSEUDOMONAS AERUGINOSA  Respiratory Panel by RT PCR (Flu A&B, Covid) - Nasopharyngeal Swab     Status: None   Collection Time: 02/22/20 12:10 PM   Specimen: Nasopharyngeal Swab  Result Value Ref Range Status   SARS Coronavirus 2 by RT PCR NEGATIVE NEGATIVE Final    Comment: (NOTE) SARS-CoV-2 target nucleic acids are NOT DETECTED.  The SARS-CoV-2 RNA is generally detectable in upper respiratoy specimens during the acute phase of infection. The lowest concentration of SARS-CoV-2 viral copies this assay can detect is 131 copies/mL. A negative result does not preclude SARS-Cov-2 infection and should not be used as the sole basis for treatment or other patient management decisions. A negative result may occur with  improper specimen collection/handling, submission of specimen other than nasopharyngeal swab, presence of viral mutation(s) within the areas targeted by this assay, and inadequate number of viral copies (<131 copies/mL). A negative result must be combined with clinical observations, patient history, and epidemiological information. The expected result is Negative.  Fact Sheet for Patients:  PinkCheek.be  Fact Sheet for Healthcare Providers:  GravelBags.it  This test is no t yet approved or cleared by the [place] FDA and  has been authorized for detection and/or diagnosis of SARS-CoV-2 by FDA under an Emergency Use Authorization (EUA). This EUA will remain  in effect (meaning this test can be used) for the duration of the COVID-19 declaration under Section 564(b)(1) of the Act, 21 U.S.C. section 360bbb-3(b)(1), unless the authorization is terminated or revoked sooner.     Influenza A by PCR NEGATIVE NEGATIVE Final   Influenza B by PCR NEGATIVE NEGATIVE Final    Comment: (NOTE) The Xpert Xpress SARS-CoV-2/FLU/RSV  assay is intended as an aid in  the diagnosis of influenza from Nasopharyngeal swab specimens and  should not be used as a sole basis for treatment. Nasal washings and  aspirates are unacceptable for Xpert Xpress SARS-CoV-2/FLU/RSV  testing.  Fact Sheet for Patients: PinkCheek.be  Fact Sheet for Healthcare Providers: GravelBags.it  This test is not yet approved or cleared by the [place] FDA and  has been authorized for detection and/or diagnosis of SARS-CoV-2 by  FDA under an Emergency Use Authorization (EUA). This EUA will remain  in effect (meaning this test can be used) for the duration of the  Covid-19 declaration under Section 564(b)(1) of the Act, 21  U.S.C. section 360bbb-3(b)(1), unless the authorization is  terminated or revoked. Performed at [facility], [address], [place], [place] [address]   Blood Culture (routine x 2)     Status: Abnormal   Collection Time: 02/22/20 12:20 PM   Specimen: BLOOD LEFT HAND  Result Value Ref Range Status   Specimen Description   Final    BLOOD LEFT HAND BOTTLES DRAWN AEROBIC AND ANAEROBIC Performed at [facility], [address], [place], [place] [address]    Special Requests   Final    Blood Culture adequate volume Performed at [facility] [address] [place], [place] [address]    Culture  Setup Time   Final    GRAM NEGATIVE RODS Gram Stain Report Called to,Read Back By and  Verified With: DANIELS,J @ 3016 ON 02/23/20 BY JUW AEROBIC BOTTLE ONLY GS DONE @ [facility] Performed at [facility], [address], [place], [place] [address]    Culture (A)  Final    PSEUDOMONAS AERUGINOSA SUSCEPTIBILITIES PERFORMED ON PREVIOUS CULTURE WITHIN THE LAST 5 DAYS. Performed at [facility], [place] [address], [place], [place] [address]    Report Status 02/25/2020 FINAL  Final  Blood Culture (routine x 2)     Status: Abnormal   Collection Time: 02/22/20 12:20 PM   Specimen:  Right Antecubital; Blood  Result Value Ref Range Status   Specimen Description   Final    RIGHT ANTECUBITAL BOTTLES DRAWN AEROBIC AND ANAEROBIC Performed at [facility], [address], [place], [place] [address]    Special Requests   Final    Blood Culture adequate volume Performed at [facility], [address] [place], [place] [address]    Culture  Setup Time   Final    GRAM NEGATIVE RODS Gram Stain Report Called to,Read Back By and Verified With: DANIELS,J @ 6237 ON 02/23/20 BY JUW AEROBIC BOTTLE ONLY GS DONE @ [facility]    Culture PSEUDOMONAS AERUGINOSA (A)  Final   Report Status 02/25/2020 FINAL  Final   Organism ID, Bacteria PSEUDOMONAS AERUGINOSA  Final      Susceptibility   Pseudomonas aeruginosa - MIC*    CEFTAZIDIME 4 SENSITIVE Sensitive     CIPROFLOXACIN <=0.25 SENSITIVE Sensitive     GENTAMICIN <=1 SENSITIVE Sensitive     IMIPENEM 2 SENSITIVE Sensitive     PIP/TAZO 8 SENSITIVE Sensitive     CEFEPIME 2 SENSITIVE Sensitive     * PSEUDOMONAS AERUGINOSA  Blood Culture ID Panel (Reflexed)     Status: Abnormal   Collection Time: 02/22/20 12:20 PM  Result Value Ref Range Status   Enterococcus faecalis NOT DETECTED NOT DETECTED Final   Enterococcus Faecium NOT DETECTED NOT DETECTED Final   Listeria monocytogenes NOT DETECTED NOT DETECTED Final   Staphylococcus species NOT DETECTED NOT DETECTED Final   Staphylococcus aureus (BCID) NOT DETECTED NOT DETECTED Final   Staphylococcus epidermidis NOT DETECTED NOT DETECTED Final   Staphylococcus lugdunensis NOT DETECTED NOT DETECTED Final   Streptococcus species NOT DETECTED NOT DETECTED Final   Streptococcus agalactiae NOT DETECTED NOT DETECTED Final   Streptococcus pneumoniae NOT DETECTED NOT DETECTED Final   Streptococcus pyogenes NOT DETECTED NOT DETECTED Final   A.calcoaceticus-baumannii NOT DETECTED NOT DETECTED Final   Bacteroides fragilis NOT DETECTED NOT DETECTED Final   Enterobacterales NOT DETECTED NOT DETECTED Final    Enterobacter cloacae complex NOT DETECTED NOT DETECTED Final   Escherichia coli NOT DETECTED NOT DETECTED Final   Klebsiella aerogenes NOT DETECTED NOT DETECTED Final   Klebsiella oxytoca NOT DETECTED NOT DETECTED Final   Klebsiella pneumoniae NOT DETECTED NOT DETECTED Final   Proteus species NOT DETECTED NOT DETECTED Final   Salmonella species NOT DETECTED NOT DETECTED Final   Serratia marcescens NOT DETECTED NOT DETECTED Final   Haemophilus influenzae NOT DETECTED NOT DETECTED Final   Neisseria meningitidis NOT DETECTED NOT DETECTED Final   Pseudomonas aeruginosa DETECTED (A) NOT DETECTED Final    Comment: CRITICAL RESULT CALLED TO, READ BACK BY AND VERIFIED WITH: Hyman Hopes RN, AT 1245 02/23/20 BY D. VANHOOK    Stenotrophomonas maltophilia NOT DETECTED NOT DETECTED Final   Candida albicans NOT DETECTED NOT DETECTED Final   Candida auris NOT DETECTED NOT DETECTED Final   Candida glabrata NOT DETECTED NOT DETECTED Final  Candida krusei NOT DETECTED NOT DETECTED Final   Candida parapsilosis NOT DETECTED NOT DETECTED Final   Candida tropicalis NOT DETECTED NOT DETECTED Final   Cryptococcus neoformans/gattii NOT DETECTED NOT DETECTED Final   CTX-M ESBL NOT DETECTED NOT DETECTED Final   Carbapenem resistance IMP NOT DETECTED NOT DETECTED Final   Carbapenem resistance KPC NOT DETECTED NOT DETECTED Final   Carbapenem resistance NDM NOT DETECTED NOT DETECTED Final   Carbapenem resistance VIM NOT DETECTED NOT DETECTED Final    Comment: Performed at [facility], [place] [address], [place], [place] [address]  MRSA PCR Screening     Status: None   Collection Time: 02/22/20 12:28 PM   Specimen: Nasopharyngeal  Result Value Ref Range Status   MRSA by PCR NEGATIVE NEGATIVE Final    Comment:        The GeneXpert MRSA Assay (FDA approved for NASAL specimens only), is one component of a comprehensive MRSA colonization surveillance program. It is not intended to diagnose MRSA infection  nor to guide or monitor treatment for MRSA infections. Performed at [facility], [address], [place], [place] [address]     Radiology Reports DG Thoracic Spine W/Swimmers  Result Date: 02/14/2020 CLINICAL DATA:  Bilateral leg swelling.  No reported back symptoms EXAM: THORACIC SPINE - 3 VIEWS COMPARISON:  None. FINDINGS: Diffuse bridging osteophytes from spondylosis. Generalized disc narrowing. No evidence of fracture, endplate erosion, or focal bone lesion. Maintained posterior mediastinal fat planes when allowing for rotation. IMPRESSION: 1. Generalized spondylosis with multi-level bridging. 2. No acute finding. Electronically Signed   By: Monte Fantasia M.D.   On: 02/14/2020 08:12   DG Lumbar Spine Complete  Result Date: 02/14/2020 CLINICAL DATA:  Leg swelling.  No known injury.  Dementia. EXAM: LUMBAR SPINE - COMPLETE 4+ VIEW COMPARISON:  None. FINDINGS: Diffuse disc narrowing and bulky endplate spurring. Diffuse facet degenerative spurring. Multilevel ankylosis is likely present. No evidence of fracture, erosion or bone lesion. IMPRESSION: 1. No acute finding. 2. Advanced lumbar spine degeneration with multiple bridging osteophytes. Electronically Signed   By: Monte Fantasia M.D.   On: 02/14/2020 08:11   DG Pelvis 1-2 Views  Result Date: 02/14/2020 CLINICAL DATA:  Bilateral leg swelling with no known injury or back pain EXAM: PELVIS - 1-2 VIEW COMPARISON:  August 25, 2017 FINDINGS: Bilateral hip degenerative changes. No signs of acute fracture or bone abnormality. Degenerative changes also noted incidentally in the lower lumbar spine. IMPRESSION: Bilateral hip degenerative changes. No signs of acute fracture. Electronically Signed   By: Zetta Bills M.D.   On: 02/14/2020 08:07   DG Ankle Complete Right  Result Date: 02/14/2020 CLINICAL DATA:  Bilateral foot pain and swelling. EXAM: RIGHT ANKLE - COMPLETE 3+ VIEW COMPARISON:  None. FINDINGS: Nonspecific soft tissue swelling. Profound  osteopenia. No acute fracture or subluxation. Bilateral malleolar spurring from enthesophytes. IMPRESSION: 1. Soft tissue swelling without acute osseous finding. 2. Marked osteopenia. Electronically Signed   By: Monte Fantasia M.D.   On: 02/14/2020 07:17   DG Chest Port 1 View  Result Date: 02/22/2020 CLINICAL DATA:  Sepsis EXAM: PORTABLE CHEST 1 VIEW COMPARISON:  February 14, 2020 FINDINGS: The heart size and mediastinal contours are within normal limits. Both lungs are clear. The visualized skeletal structures are unremarkable. IMPRESSION: No active disease. Electronically Signed   By: Abelardo Diesel M.D.   On: 02/22/2020 14:08   DG Chest Portable 1 View  Result Date: 02/14/2020 CLINICAL DATA:  Bilateral foot pain EXAM:  PORTABLE CHEST 1 VIEW COMPARISON:  08/25/2017 FINDINGS: Normal heart size and stable aortic tortuosity. There is no edema, consolidation, effusion, or pneumothorax. Artifact from EKG leads. Degenerative endplate spurring. IMPRESSION: No evidence of acute disease. Electronically Signed   By: Monte Fantasia M.D.   On: 02/14/2020 07:06   DG Swallowing Func-Speech Pathology  Result Date: 02/26/2020 Objective Swallowing Evaluation: Type of Study: MBS-Modified Barium Swallow Study  Patient Details Name: JOENATHAN SAKUMA MRN: [id number] Date of Birth: [birth year] Today's Date: 02/26/2020 Time: SLP Start Time (ACUTE ONLY): 1339 -SLP Stop Time (ACUTE ONLY): 1408 SLP Time Calculation (min) (ACUTE ONLY): 29 min Past Medical History: Past Medical History: Diagnosis Date . Dementia ([place])  . Diabetes mellitus without complication ([place])  . Encephalopathy  . Gastritis 2004 . GIB (gastrointestinal bleeding) 2004 . Hypertension  . Poor historian  . Sinus bradycardia seen on cardiac monitor  Past Surgical History: Past Surgical History: Procedure Laterality Date . INGUINAL HERNIA REPAIR  09/2010  Bilateral . UPPER GASTROINTESTINAL ENDOSCOPY  2004 HPI: This 86 y.o.malewith medical history significant  ofDementia,DiabetesMellitus II, Hypertension, CKD stage IV, premature atrial contractions,history of GI bleed, presents in the emergency department with confusion and fever. This is patient's third ER visit In last three weeks.Last week he presented in the ED with generalized weakness and pain,found to have a UTI . He was discharged on Keflex. Daughter reportshe has been having worsening pain and weakness, was not able to stand up in the morning. Patient is admitted for sepsis secondary to Pseudomonas bacteremia.  He was also found to have high anion gap metabolic acidosis secondary to DKA which has been resolved. He is transitioned to subcu insulin. BSE requested.  Subjective: "I want water!" Assessment / Plan / Recommendation CHL IP CLINICAL IMPRESSIONS 02/26/2020 Clinical Impression Pt presents with mild oropharyngeal sensorimotor dysphagia characterized by decreased laryngeal vestibule closure resulting in two episodes of trace silent aspiration of thin liquids via straw and consistent penetration of thin liquids and NTL during the swallow that is usually expelled with repeat swallow of solid textures. Decreased pharyngeal constriction resulting in inconsistent clearance of penetrates however despite remaining in the airway, they were only visualized dropping to and below the cords twice as mentioned above. Solid textures were consumed without incident and the barium tablet was swallowed with thin liquids with noted brief stasis in the pyriforms but quickly passed through. RN was present for MBS and BP and HR were stable throughout procedure. Recommend D3/mech soft and Nectar thick liquids; recommend meds whole or crushed in puree as Pt is able. Also recommend free water protocol, * single ice chips after oral care in between meals *. ST will continue to follow acutely. SLP Visit Diagnosis Dysphagia, unspecified (R13.10) Attention and concentration deficit following -- Frontal lobe and executive  function deficit following -- Impact on safety and function Mild aspiration risk;Moderate aspiration risk   CHL IP TREATMENT RECOMMENDATION 02/26/2020 Treatment Recommendations Therapy as outlined in treatment plan below   Prognosis 02/26/2020 Prognosis for Safe Diet Advancement Fair Barriers to Reach Goals Cognitive deficits Barriers/Prognosis Comment -- CHL IP DIET RECOMMENDATION 02/26/2020 SLP Diet Recommendations Dysphagia 3 (Mech soft) solids;Nectar thick liquid Liquid Administration via Cup;No straw Medication Administration Whole meds with puree Compensations Slow rate;Small sips/bites Postural Changes Remain semi-upright after after feeds/meals (Comment);Seated upright at 90 degrees   CHL IP OTHER RECOMMENDATIONS 02/26/2020 Recommended Consults -- Oral Care Recommendations Oral care BID Other Recommendations Order thickener from pharmacy   CHL IP FOLLOW UP RECOMMENDATIONS 02/26/2020 Follow  up Recommendations 24 hour supervision/assistance   CHL IP FREQUENCY AND DURATION 02/26/2020 Speech Therapy Frequency (ACUTE ONLY) min 2x/week Treatment Duration 1 week      CHL IP ORAL PHASE 02/26/2020 Oral Phase WFL Oral - Pudding Teaspoon -- Oral - Pudding Cup -- Oral - Honey Teaspoon -- Oral - Honey Cup -- Oral - Nectar Teaspoon -- Oral - Nectar Cup -- Oral - Nectar Straw -- Oral - Thin Teaspoon -- Oral - Thin Cup -- Oral - Thin Straw -- Oral - Puree -- Oral - Mech Soft -- Oral - Regular -- Oral - Multi-Consistency -- Oral - Pill -- Oral Phase - Comment --  CHL IP PHARYNGEAL PHASE 02/26/2020 Pharyngeal Phase Impaired Pharyngeal- Pudding Teaspoon -- Pharyngeal -- Pharyngeal- Pudding Cup -- Pharyngeal -- Pharyngeal- Honey Teaspoon Pharyngeal residue - valleculae;Pharyngeal residue - pyriform;Reduced pharyngeal peristalsis Pharyngeal -- Pharyngeal- Honey Cup -- Pharyngeal -- Pharyngeal- Nectar Teaspoon Reduced pharyngeal peristalsis;Reduced airway/laryngeal closure;Penetration/Aspiration during swallow;Pharyngeal residue  - valleculae;Pharyngeal residue - pyriform Pharyngeal Material enters airway, remains ABOVE vocal cords then ejected out Pharyngeal- Nectar Cup Reduced pharyngeal peristalsis;Reduced airway/laryngeal closure;Penetration/Aspiration during swallow;Pharyngeal residue - valleculae;Pharyngeal residue - pyriform Pharyngeal Material enters airway, remains ABOVE vocal cords then ejected out Pharyngeal- Nectar Straw -- Pharyngeal -- Pharyngeal- Thin Teaspoon Delayed swallow initiation-pyriform sinuses;Reduced pharyngeal peristalsis;Reduced epiglottic inversion;Reduced anterior laryngeal mobility;Reduced laryngeal elevation;Reduced airway/laryngeal closure;Reduced tongue base retraction;Penetration/Aspiration during swallow;Pharyngeal residue - valleculae;Pharyngeal residue - pyriform Pharyngeal Material enters airway, remains ABOVE vocal cords and not ejected out Pharyngeal- Thin Cup Delayed swallow initiation-pyriform sinuses;Reduced pharyngeal peristalsis;Reduced epiglottic inversion;Reduced anterior laryngeal mobility;Reduced laryngeal elevation;Reduced airway/laryngeal closure;Reduced tongue base retraction;Penetration/Aspiration during swallow;Pharyngeal residue - valleculae;Pharyngeal residue - pyriform Pharyngeal Material enters airway, remains ABOVE vocal cords and not ejected out Pharyngeal- Thin Straw Delayed swallow initiation-pyriform sinuses;Reduced pharyngeal peristalsis;Reduced epiglottic inversion;Reduced anterior laryngeal mobility;Reduced laryngeal elevation;Reduced airway/laryngeal closure;Reduced tongue base retraction;Penetration/Aspiration during swallow;Pharyngeal residue - valleculae;Pharyngeal residue - pyriform Pharyngeal Material enters airway, passes BELOW cords without attempt by patient to eject out (silent aspiration) Pharyngeal- Puree WFL Pharyngeal -- Pharyngeal- Mechanical Soft -- Pharyngeal -- Pharyngeal- Regular WFL Pharyngeal -- Pharyngeal- Multi-consistency -- Pharyngeal -- Pharyngeal-  Pill Pharyngeal residue - cp segment;Pharyngeal residue - pyriform Pharyngeal -- Pharyngeal Comment --  CHL IP CERVICAL ESOPHAGEAL PHASE 02/26/2020 Cervical Esophageal Phase WFL Pudding Teaspoon -- Pudding Cup -- Honey Teaspoon -- Honey Cup -- Nectar Teaspoon -- Nectar Cup -- Nectar Straw -- Thin Teaspoon -- Thin Cup -- Thin Straw -- Puree -- Mechanical Soft -- Regular -- Multi-consistency -- Pill -- Cervical Esophageal Comment -- Amelia H. Roddie Mc, CCC-SLP Speech Language Pathologist Wende Bushy 02/26/2020, 4:49 PM              US Abdomen Limited RUQ  Result Date: 02/23/2020 CLINICAL DATA:  Elevated liver enzymes EXAM: ULTRASOUND ABDOMEN LIMITED RIGHT UPPER QUADRANT COMPARISON:  August 25, 2017 FINDINGS: Gallbladder: No wall thickening visualized. Gallstones are identified. 0.3 cm polyp is identified. No sonographic Murphy sign noted by sonographer. Common bile duct: Diameter: 0.3 mm Liver: 1.5 x 1.1 x 1.4 cm cyst is identified in the left lobe liver. Within normal limits in parenchymal echogenicity. Portal vein is patent on color Doppler imaging with normal direction of blood flow towards the liver. Other: None. IMPRESSION: 1. Cholelithiasis without sonographic evidence of acute cholecystitis. 2. 1.5 cm cyst in the left lobe liver. Electronically Signed   By: Abelardo Diesel M.D.   On: 02/23/2020 14:49     Time Spent in minutes  30  Desiree Hane M.D on 02/26/2020 at 5:12 PM  To page go to [email or web address] - password [facility]

## 2020-02-26 NOTE — Progress Notes (Signed)
Skin tear noted to scrotum and penis after condom catheter discontinued during patient transfer. Primofit male external urinary drainage system placed and telfa nonadhesive dressing placed on scrotum. MD made aware.

## 2020-02-26 NOTE — [place] FL2 (Signed)
[place] MEDICAID FL2 LEVEL OF CARE SCREENING TOOL     IDENTIFICATION  Patient Name: Paul Perez Birthdate: 04-23-1928 Sex: male Admission Date (Current Location): 02/22/2020  [facility] and [place] Number:  [employer] and Address:  [place] [address], [place]      Provider Number: [id number]  Attending Physician Name and Address:  Desiree Hane, MD  Relative Name and Phone Number:  Remijio Holleran (son) Ph: [phone]    Current Level of Care: Hospital Recommended Level of Care: [place] Prior Approval Number:    Date Approved/Denied:   PASRR Number:    Discharge Plan: SNF    Current Diagnoses: Patient Active Problem List   Diagnosis Date Noted  . Severe sepsis with acute organ dysfunction ([place]) 02/22/2020  . CKD (chronic kidney disease) stage 4, GFR 15-29 ml/min (HCC) 12/21/2017  . Anemia 12/21/2017  . Dementia ([place]) 09/28/2017  . Leg edema 09/28/2017  . Acute on chronic diastolic CHF (congestive heart failure) ([place]) 09/27/2017  . Acute renal failure superimposed on stage 3 chronic kidney disease ([place])   . Elevated troponin 08/25/2017  . Hyperlipidemia 08/25/2017  . Diabetes mellitus type 2, uncontrolled, with complications ([place]) 99/24/2683  . Bradycardia 08/28/2013  . PAC (premature atrial contraction) 08/28/2013  . Altered mental state 08/20/2013  . Acute encephalopathy 08/20/2013  . Hypertension 08/20/2013  . Rhabdomyolysis 08/20/2013  . UTI (lower urinary tract infection) 08/20/2013    Orientation RESPIRATION BLADDER Height & Weight     Self  Normal Incontinent, External catheter Weight: 191 lb 9.3 oz (86.9 kg) Height:  6' (182.9 cm)  BEHAVIORAL SYMPTOMS/MOOD NEUROLOGICAL BOWEL NUTRITION STATUS      Continent Diet (Dysphagia diet)  AMBULATORY STATUS COMMUNICATION OF NEEDS Skin   Extensive Assist Verbally (Somewhat impaired due to dementia) Other (Comment) (Skin tear)                        Personal Care Assistance Level of Assistance  Bathing, Feeding, Dressing Bathing Assistance: Maximum assistance Feeding assistance: Limited assistance Dressing Assistance: Maximum assistance     Functional Limitations Info  Sight, Hearing, Speech Sight Info: Adequate Hearing Info: Adequate Speech Info: Impaired    SPECIAL CARE FACTORS FREQUENCY  PT (By licensed PT)     PT Frequency: 5x's/week              Contractures Contractures Info: Not present    Additional Factors Info  Code Status, Allergies, Psychotropic Code Status Info: Full Allergies Info: NKA Psychotropic Info: BuSpar (buspirone)         Current Medications (02/26/2020):  This is the current hospital active medication list Current Facility-Administered Medications  Medication Dose Route Frequency Provider Last Rate Last Admin  . acetaminophen (TYLENOL) tablet 650 mg  650 mg Oral Q6H PRN Shawna Clamp, MD       Or  . acetaminophen (TYLENOL) suppository 650 mg  650 mg Rectal Q6H PRN Shawna Clamp, MD   650 mg at 02/24/20 0249  . busPIRone (BUSPAR) tablet 5 mg  5 mg Oral BID Shawna Clamp, MD   5 mg at 02/26/20 1020  . ceFEPIme (MAXIPIME) 2 g in sodium chloride 0.9 % 100 mL IVPB  2 g Intravenous Q24H Shawna Clamp, MD   Stopped at 02/25/20 1539  . Chlorhexidine Gluconate Cloth 2 % PADS 6 each  6 each Topical Daily Shawna Clamp, MD   6 each at 02/26/20 1023  . cloNIDine (  CATAPRES - Dosed in mg/24 hr) patch 0.1 mg  0.1 mg Transdermal Weekly Blount, Xenia T, NP   0.1 mg at 02/23/20 2050  . dextrose 5 % in lactated ringers infusion   Intravenous Continuous Oretha Milch D, MD      . dextrose 50 % solution 0-50 mL  0-50 mL Intravenous PRN Shawna Clamp, MD      . docusate sodium (COLACE) capsule 100 mg  100 mg Oral BID Shawna Clamp, MD   100 mg at 02/26/20 1021  . donepezil (ARICEPT) tablet 5 mg  5 mg Oral QHS Shawna Clamp, MD   5 mg at 02/25/20 2100  . enoxaparin (LOVENOX) injection 30 mg   30 mg Subcutaneous Q24H Shawna Clamp, MD   30 mg at 02/25/20 1651  . furosemide (LASIX) tablet 40 mg  40 mg Oral Daily Shawna Clamp, MD   40 mg at 02/26/20 1020  . hydrALAZINE (APRESOLINE) injection 10 mg  10 mg Intravenous Q8H PRN Shawna Clamp, MD   10 mg at 02/26/20 1312  . hydrALAZINE (APRESOLINE) tablet 75 mg  75 mg Oral TID Satira Sark, MD   75 mg at 02/26/20 1019  . insulin aspart (novoLOG) injection 0-6 Units  0-6 Units Subcutaneous TID WC Oretha Milch D, MD   2 Units at 02/26/20 1220  . insulin aspart (novoLOG) injection 2 Units  2 Units Subcutaneous TID WC Nettey, Myrlene Broker D, MD      . insulin detemir (LEVEMIR) injection 18 Units  18 Units Subcutaneous Daily Desiree Hane, MD   18 Units at 02/26/20 1035  . lactated ringers bolus 1,814 mL  20 mL/kg Intravenous Once Shawna Clamp, MD      . MEDLINE mouth rinse  15 mL Mouth Rinse BID Shawna Clamp, MD   15 mL at 02/26/20 1024  . metoprolol tartrate (LOPRESSOR) tablet 12.5 mg  12.5 mg Oral BID Satira Sark, MD   12.5 mg at 02/26/20 1018  . ondansetron (ZOFRAN) tablet 4 mg  4 mg Oral Q6H PRN Shawna Clamp, MD       Or  . ondansetron [facility]) injection 4 mg  4 mg Intravenous Q6H PRN Shawna Clamp, MD      . senna (SENOKOT) tablet 8.6 mg  1 tablet Oral BID Shawna Clamp, MD   8.6 mg at 02/26/20 1020  . sodium chloride flush (NS) 0.9 % injection 3 mL  3 mL Intravenous Q12H Shawna Clamp, MD   3 mL at 02/26/20 1037     Discharge Medications: Please see discharge summary for a list of discharge medications.  Relevant Imaging Results:  Relevant Lab Results:   Additional Information SSN: [id number]  Sherie Don, LCSW

## 2020-02-26 NOTE — Care Management (Addendum)
RE: Paul Perez DOB: [birth year] Date: 02/26/2020 MUST ID: [id number]  To Whom It May Concern:  Please be advised that the above name patient will require a short-term nursing home stay--anticipated 30 days or less rehabilitation and strengthening. The plan is for return home.

## 2020-02-26 NOTE — TOC Progression Note (Signed)
Transition of Care [facility]) - Progression Note   Patient Details  Name: DODGE ATOR MRN: [id number] Date of Birth: Jul 12, 1927  Transition of Care [facility]) CM/SW [place], LCSW Phone Number: 02/26/2020, 3:55 PM  Clinical Narrative: PT evaluation recommended SNF. CSW spoke with patient's son, Raeqwon Lux, as patient has dementia. Son requested [facility] for SNF. CSW completed FL2 and faxed it to [place]. [facility] has made bed offer. CSW started PASRR and uploaded requested documents. CSW called Bernadene Bell to start insurance authorization. Reference # is: [id number]. Clinicals faxed to [facility]. TOC to follow.  Expected Discharge Plan: [place] Barriers to Discharge: Continued Medical Work up  Expected Discharge Plan and Services Expected Discharge Plan: [place] arrangements for the past 2 months: [place]  Readmission Risk Interventions No flowsheet data found.

## 2020-02-26 NOTE — Evaluation (Signed)
Physical Therapy Evaluation Patient Details Name: Paul Perez MRN: [id number] DOB: February 08, 1928 Today's Date: 02/26/2020   History of Present Illness  Paul Perez is a 86 y.o. male with medical history significant of Dementia, Diabetes Mellitus II,  Hypertension, CKD stage IV, premature atrial contractions , history of GI bleed, presents in the emergency department with confusion and fever. History is obtained from ED chart. Patient is demented,  unable to provide history.  History is also obtained from daughter [name] over the phone.  This is patient's third ER visit  In last three weeks.  Last week he presented in the ED with generalized weakness and pain,  found to have a UTI . He was discharged on Keflex.  Daughter reports he has been having worsening pain and weakness, was not able to stand up in the morning.  He was brought in the emergency department the morning.    Clinical Impression  Patient demonstrates slow labored movement for sitting up at bedside with c/o pain with pressure to BLE, once seated patient unable to stand or transfer to chair due to BLE weakness and limited bilateral knee flexion with c/o severe pain in knees when attempting to manually flex them.  Patient required Max assist to reposition when put back to bed.  Patient will benefit from continued physical therapy in hospital and recommended venue below to increase strength, balance, endurance for safe ADLs and gait.     Follow Up Recommendations SNF    Equipment Recommendations  None recommended by PT    Recommendations for Other Services       Precautions / Restrictions Precautions Precautions: Fall Restrictions Weight Bearing Restrictions: No      Mobility  Bed Mobility Overal bed mobility: Needs Assistance Bed Mobility: Supine to Sit;Sit to Supine     Supine to sit: Max assist Sit to supine: Max assist   General bed mobility comments: increased time, labored movement  Transfers                     Ambulation/Gait                Stairs            Wheelchair Mobility    Modified Rankin (Stroke Patients Only)       Balance Overall balance assessment: Needs assistance Sitting-balance support: Feet supported;No upper extremity supported Sitting balance-Leahy Scale: Poor Sitting balance - Comments: fair/poor seated at EOB                                     Pertinent Vitals/Pain Pain Assessment: Faces Faces Pain Scale: Hurts even more Pain Location: with pressure to BLE Pain Descriptors / Indicators: Grimacing Pain Intervention(s): Limited activity within patient's tolerance;Monitored during session;Repositioned    Home Living Family/patient expects to be discharged to:: Assisted living               Home Equipment: Bedside commode;Cane - single point;Walker - 2 wheels;Shower seat;Wheelchair - manual      Prior Function Level of Independence: Needs assistance   Gait / Transfers Assistance Needed: patient unable to state due to poor historian  ADL's / Homemaking Assistance Needed: assisted by ALF staff        Hand Dominance   Dominant Hand: Right    Extremity/Trunk Assessment   Upper Extremity Assessment Upper Extremity Assessment: Generalized weakness    Lower Extremity  Assessment Lower Extremity Assessment: Generalized weakness    Cervical / Trunk Assessment Cervical / Trunk Assessment: Normal  Communication   Communication: Other (comment) (Patient appears confused and poor historian)  Cognition Arousal/Alertness: Awake/alert Behavior During Therapy: Flat affect;Anxious Overall Cognitive Status: No family/caregiver present to determine baseline cognitive functioning                                        General Comments      Exercises     Assessment/Plan    PT Assessment Patient needs continued PT services  PT Problem List Decreased strength;Decreased activity  tolerance;Decreased balance;Decreased mobility       PT Treatment Interventions Gait training;Functional mobility training;Therapeutic activities;Therapeutic exercise;Balance training;Patient/family education;Stair training    PT Goals (Current goals can be found in the Care Plan section)  Acute Rehab PT Goals Patient Stated Goal: not stated PT Goal Formulation: With patient Time For Goal Achievement: 03/11/20 Potential to Achieve Goals: Fair    Frequency Min 3X/week   Barriers to discharge        Co-evaluation               AM-PAC PT "6 Clicks" Mobility  Outcome Measure Help needed turning from your back to your side while in a flat bed without using bedrails?: A Lot Help needed moving from lying on your back to sitting on the side of a flat bed without using bedrails?: A Lot Help needed moving to and from a bed to a chair (including a wheelchair)?: Total Help needed standing up from a chair using your arms (e.g., wheelchair or bedside chair)?: Total Help needed to walk in hospital room?: Total Help needed climbing 3-5 steps with a railing? : Total 6 Click Score: 8    End of Session   Activity Tolerance: Patient tolerated treatment well;Patient limited by fatigue;Patient limited by pain Patient left: in bed;with call bell/phone within reach Nurse Communication: Mobility status PT Visit Diagnosis: Unsteadiness on feet (R26.81);Other abnormalities of gait and mobility (R26.89);Muscle weakness (generalized) (M62.81)    Time: 9417-4081 PT Time Calculation (min) (ACUTE ONLY): 25 min   Charges:   PT Evaluation $PT Eval Moderate Complexity: 1 Mod PT Treatments $Therapeutic Activity: 23-37 mins        10:24 AM, 02/26/20 Lonell Grandchild, MPT Physical Therapist with [facility] [phone] office [phone] mobile phone

## 2020-02-27 DIAGNOSIS — I480 Paroxysmal atrial fibrillation: Secondary | ICD-10-CM | POA: Diagnosis not present

## 2020-02-27 DIAGNOSIS — R7881 Bacteremia: Secondary | ICD-10-CM | POA: Diagnosis not present

## 2020-02-27 DIAGNOSIS — E118 Type 2 diabetes mellitus with unspecified complications: Secondary | ICD-10-CM | POA: Diagnosis not present

## 2020-02-27 DIAGNOSIS — G9341 Metabolic encephalopathy: Secondary | ICD-10-CM | POA: Diagnosis not present

## 2020-02-27 LAB — CBC
HCT: 29.8 % — ABNORMAL LOW (ref 39.0–52.0)
Hemoglobin: 9.3 g/dL — ABNORMAL LOW (ref 13.0–17.0)
MCH: 26.9 pg (ref 26.0–34.0)
MCHC: 31.2 g/dL (ref 30.0–36.0)
MCV: 86.1 fL (ref 80.0–100.0)
Platelets: 203 10*3/uL (ref 150–400)
RBC: 3.46 MIL/uL — ABNORMAL LOW (ref 4.22–5.81)
RDW: 14.3 % (ref 11.5–15.5)
WBC: 10.7 10*3/uL — ABNORMAL HIGH (ref 4.0–10.5)
nRBC: 0 % (ref 0.0–0.2)

## 2020-02-27 LAB — GLUCOSE, CAPILLARY
Glucose-Capillary: 252 mg/dL — ABNORMAL HIGH (ref 70–99)
Glucose-Capillary: 375 mg/dL — ABNORMAL HIGH (ref 70–99)
Glucose-Capillary: 310 mg/dL — ABNORMAL HIGH (ref 70–99)
Glucose-Capillary: 121 mg/dL — ABNORMAL HIGH (ref 70–99)

## 2020-02-27 LAB — BASIC METABOLIC PANEL
Anion gap: 13 (ref 5–15)
BUN: 70 mg/dL — ABNORMAL HIGH (ref 8–23)
CO2: 22 mmol/L (ref 22–32)
Calcium: 9.1 mg/dL (ref 8.9–10.3)
Chloride: 112 mmol/L — ABNORMAL HIGH (ref 98–111)
Creatinine, Ser: 2.83 mg/dL — ABNORMAL HIGH (ref 0.61–1.24)
GFR, Estimated: 19 mL/min — ABNORMAL LOW (ref >60)
Glucose, Bld: 392 mg/dL — ABNORMAL HIGH (ref 70–99)
Potassium: 3.6 mmol/L (ref 3.5–5.1)
Sodium: 147 mmol/L — ABNORMAL HIGH (ref 135–145)

## 2020-02-27 MED ORDER — INSULIN ASPART 100 UNIT/ML ~~LOC~~ SOLN
0.0000 [IU] | Freq: Three times a day (TID) | SUBCUTANEOUS | Status: DC
  Administered 2020-02-27: 7 [IU] via SUBCUTANEOUS
  Administered 2020-02-28: 5 [IU] via SUBCUTANEOUS
  Administered 2020-02-28: 3 [IU] via SUBCUTANEOUS
  Administered 2020-02-28 – 2020-02-29 (×2): 1 [IU] via SUBCUTANEOUS
  Administered 2020-02-29: 3 [IU] via SUBCUTANEOUS
  Administered 2020-02-29: 1 [IU] via SUBCUTANEOUS
  Administered 2020-03-01 (×2): 5 [IU] via SUBCUTANEOUS
  Administered 2020-03-01: 2 [IU] via SUBCUTANEOUS
  Administered 2020-03-02: 1 [IU] via SUBCUTANEOUS
  Administered 2020-03-02: 3 [IU] via SUBCUTANEOUS
  Administered 2020-03-03: 2 [IU] via SUBCUTANEOUS
  Administered 2020-03-03: 7 [IU] via SUBCUTANEOUS
  Administered 2020-03-04: 3 [IU] via SUBCUTANEOUS
  Administered 2020-03-05 – 2020-03-06 (×2): 1 [IU] via SUBCUTANEOUS

## 2020-02-27 MED ORDER — METOPROLOL TARTRATE 25 MG PO TABS
12.5000 mg | ORAL_TABLET | Freq: Once | ORAL | Status: AC
  Administered 2020-02-27: 12.5 mg via ORAL
  Filled 2020-02-27: qty 1

## 2020-02-27 MED ORDER — METOPROLOL TARTRATE 25 MG PO TABS
25.0000 mg | ORAL_TABLET | Freq: Two times a day (BID) | ORAL | Status: DC
  Administered 2020-02-27 – 2020-03-06 (×15): 25 mg via ORAL
  Filled 2020-02-27 (×16): qty 1

## 2020-02-27 MED ORDER — INSULIN ASPART 100 UNIT/ML ~~LOC~~ SOLN
3.0000 [IU] | Freq: Three times a day (TID) | SUBCUTANEOUS | Status: DC
  Administered 2020-02-27 – 2020-02-28 (×3): 3 [IU] via SUBCUTANEOUS

## 2020-02-27 MED ORDER — MORPHINE SULFATE (PF) 2 MG/ML IV SOLN
2.0000 mg | INTRAVENOUS | Status: DC | PRN
  Administered 2020-02-27 – 2020-02-29 (×8): 2 mg via INTRAVENOUS
  Filled 2020-02-27 (×9): qty 1

## 2020-02-27 NOTE — Progress Notes (Signed)
  Speech Language Pathology Treatment: Dysphagia  Patient Details Name: Paul Perez MRN: [id number] DOB: 07/22/1927 Today's Date: 02/27/2020 Time: 8088-1103 SLP Time Calculation (min) (ACUTE ONLY): 17 min  Assessment / Plan / Recommendation Clinical Impression  Pt seen for ongoing dysphagia intervention following MBSS completed yesterday. Study was reviewed by this clinician. Pt with notable reduced oral control and bolus cohesion with premature spillage and delay in swallow initiation. In session today, he was consuming mechanical soft textures and NTL with feeding assist from RN. Pt repositioned upright (leaning to the left). He demonstrated inefficient mastication and lingual manipulation of solids resulting in prolonged oral phase and min oral residue. Pt initially unable to suck from the straw per RN (biting on the straw) and SLP provided small amount at the tip of the straw and held to lips to encourage sucking. He was then able to use the straw for NTL. RN reports vitals have been stable with the exception of elevated BP. Recommend continue diet as ordered D3/mech soft and NTL and SLP will follow. Prognosis for advancement to thin liquids is good with improved alertness.    HPI HPI: This 86 y.o.malewith medical history significant ofDementia,DiabetesMellitus II, Hypertension, CKD stage IV, premature atrial contractions,history of GI bleed, presents in the emergency department with confusion and fever. This is patient's third ER visit In last three weeks.Last week he presented in the ED with generalized weakness and pain,found to have a UTI . He was discharged on Keflex. Daughter reportshe has been having worsening pain and weakness, was not able to stand up in the morning. Patient is admitted for sepsis secondary to Pseudomonas bacteremia.  He was also found to have high anion gap metabolic acidosis secondary to DKA which has been resolved. He is transitioned to subcu insulin.  BSE requested.       SLP Plan  Continue with current plan of care       Recommendations  Diet recommendations: Dysphagia 3 (mechanical soft);Nectar-thick liquid Liquids provided via: Cup;Straw Medication Administration: Whole meds with puree Supervision: Staff to assist with self feeding;Full supervision/cueing for compensatory strategies Compensations: Slow rate;Small sips/bites Postural Changes and/or Swallow Maneuvers: Seated upright 90 degrees;Upright 30-60 min after meal                Oral Care Recommendations: Oral care BID;Staff/trained caregiver to provide oral care Follow up Recommendations: 24 hour supervision/assistance SLP Visit Diagnosis: Dysphagia, oropharyngeal phase (R13.12) Plan: Continue with current plan of care                     Thank you,  Genene Churn, [place]   [place] 02/27/2020, 10:25 AM

## 2020-02-27 NOTE — Progress Notes (Signed)
[facility]  PROGRESS NOTE  Paul Perez ZOX:[id number] DOB: 12/15/1927 DOA: 02/22/2020 PCP: Paul Perez Paul Perez date - 02/22/2020   Admitting Physician Paul Clamp, MD  Outpatient Primary MD for Paul patient is Paul Perez, Paul Perez  LOS - 5 Brief Narrative   Paul Perez is a 86 y.o. year old male with medical history significant for Beatties, dementia, HTN, CKDStage IV, PACs who presented on 2/9 with confusion and fever after recently being treated for UTI with Keflex and was found to have severe sepsis secondary to Pseudomonas bacteremia.  Hospital course complicated by DKA, paroxysmal atrial fibrillation, oropharyngeal dysphagia.  Subjective  Today resting comfortably in bed.  He did undergo barium swallow on 10/13 and is tolerating dysphagia diet  A & P  Mild oropharyngeal dysphagia.  Underwent barium swallow on 10/13.  Chest x-ray on 10/13 unremarkable -Speech recommends dysphagia 3 diet, closely monitor. -Aspiration precautions  Paroxysmal atrial fibrillation with RVR, currently in normal sinus rhythm and rate controlled.  Occurred in setting of potential aspiration event. -Appreciate cardiology recommendations, continue oral Lopressor -Not a candidate for anticoagulation given dementia, and prior history of GI bleed   Hypertension, elevated SBP's in Paul 170s-180s.  Hydralazine was increased to 20 mg 3 times daily on 10/13 -Continue current dose of hydralazine 75 mg 3 times daily, continue to monitor amlodipine may be necessary if remains elevated -Clonidine patch, Lasix  Severe sepsis secondary to pseudomonal bacteremia.  Sepsis physiology resolved, had low-grade fever 100.4 -Continue IV cefepime for total of 7days, end date 10/17 -Repeat blood cultures, monitor CBC  DKA, resolved Type 2 diabetes, poorly controlled A1c greater than 15.  CBGs in Paul 250s but he did have a value of 140s - Levemir to 18 units, was recently increased on 10/13 we  will monitor -NovoLog 2 units 3 times daily with meals -Sliding scale as needed with meals  Mood disorder, stable -BuSpar  Hyponatremia, resolved.  In setting of dehydration improved with IV fluids  Elevated liver enzymes, resolved.  Right upper quad ultrasound showed cholelithiasis.  CKD, stage IV, creatinine stable at baseline.  Did have peak creatinine of 3.58 during hospitalization for which nephrology recommended continue supportive care -Avoid nephrotoxic agents  Acute encephalopathy, multifactorial (infectious, metabolic), waxes and wanes.  Alert to self and place this morning, now confused.  In setting of dementia for which baseline is able to have conversation per daughter.  Likely further complicated by hospital-acquired delirium -Delirium precautions -Expect improvement with treatment of medical issues -Continue home Aricept     Family Communication  : Spoke with daughter Paul Perez at bedside on 10/13  Code Status : Full code, discussed patient's desire of advanced directives with Paul Perez who states he has no specific directives.  She has initiated conversations with her brothers regarding goals of care  Disposition Plan  :  Patient is from ALF. Anticipated d/c date: 2 to 3 days. Barriers to d/c or necessity for inpatient status:  Needs to continue IV cefepime, close monitoring of A. fib, close monitoring of BP and mental status. Plan to transfer to telemetry bed Consults  : Cardiology  Procedures  : Barium swallow, 10/13  DVT Prophylaxis  :  Lovenox   MDM: Paul below labs and imaging reports were reviewed and summarized above.  Medication management as above.  Lab Results  Component Value Date   PLT 169 02/26/2020    Diet :  Diet Order  DIET DYS 3 Room service appropriate? Yes; Fluid consistency: Nectar Thick  Diet effective now                  Inpatient Medications Scheduled Meds: . busPIRone  5 mg Oral BID  . Chlorhexidine Gluconate Cloth   6 each Topical Daily  . cloNIDine  0.1 mg Transdermal Weekly  . docusate sodium  100 mg Oral BID  . donepezil  5 mg Oral QHS  . enoxaparin (LOVENOX) injection  30 mg Subcutaneous Q24H  . furosemide  40 mg Oral Daily  . hydrALAZINE  75 mg Oral TID  . insulin aspart  0-6 Units Subcutaneous TID WC  . insulin aspart  2 Units Subcutaneous TID WC  . insulin detemir  18 Units Subcutaneous Daily  . mouth rinse  15 mL Mouth Rinse BID  . metoprolol tartrate  12.5 mg Oral BID  . senna  1 tablet Oral BID  . sodium chloride flush  3 mL Intravenous Q12H   Continuous Infusions: . ceFEPime (MAXIPIME) IV 200 mL/hr at 02/26/20 1600  . lactated ringers     PRN Meds:.acetaminophen **OR** acetaminophen, dextrose, hydrALAZINE, melatonin, morphine injection, ondansetron **OR** ondansetron (ZOFRAN) IV  Antibiotics  :   Anti-infectives (From admission, onward)   Start     Dose/Rate Route Frequency Ordered Stop   02/24/20 1300  vancomycin (VANCOCIN) IVPB 1000 mg/200 mL premix  Status:  Discontinued        1,000 mg 200 mL/hr over 60 Minutes Intravenous Every 48 hours 02/22/20 1331 02/24/20 0839   02/23/20 1500  ceFEPIme (MAXIPIME) 2 g in sodium chloride 0.9 % 100 mL IVPB        2 g 200 mL/hr over 30 Minutes Intravenous Every 24 hours 02/22/20 1457     02/22/20 2000  metroNIDAZOLE (FLAGYL) IVPB 500 mg  Status:  Discontinued        500 mg 100 mL/hr over 60 Minutes Intravenous Every 8 hours 02/22/20 1549 02/24/20 0839   02/22/20 1300  vancomycin (VANCOREADY) IVPB 2000 mg/400 mL        2,000 mg 200 mL/hr over 120 Minutes Intravenous  Once 02/22/20 1219 02/22/20 1545   02/22/20 1215  ceFEPIme (MAXIPIME) 2 g in sodium chloride 0.9 % 100 mL IVPB        2 g 200 mL/hr over 30 Minutes Intravenous  Once 02/22/20 1207 02/22/20 1406   02/22/20 1215  metroNIDAZOLE (FLAGYL) IVPB 500 mg        500 mg 100 mL/hr over 60 Minutes Intravenous  Once 02/22/20 1207 02/22/20 1406   02/22/20 1215  vancomycin (VANCOCIN) IVPB  1000 mg/200 mL premix  Status:  Discontinued        1,000 mg 200 mL/hr over 60 Minutes Intravenous  Once 02/22/20 1207 02/22/20 1219       Objective   Vitals:   02/27/20 0100 02/27/20 0400 02/27/20 0500 02/27/20 0719  BP: (!) 165/33     Pulse: 69   68  Resp: (!) 24   (!) 21  Temp:  98.7 F (37.1 C)  (!) 100.4 F (38 C)  TempSrc:  Axillary  Axillary  SpO2: 98%   99%  Weight:   86.5 kg   Height:        SpO2: 99 % O2 Flow Rate (L/min): 2 L/min  Wt Readings from Last 3 Encounters:  02/27/20 86.5 kg  02/17/20 90.7 kg  02/14/20 87.1 kg     Intake/Output Summary (Last 24 hours) at  02/27/2020 0854 Last data filed at 02/26/2020 1812 Gross per 24 hour  Intake 111 ml  Output 402 ml  Net -291 ml    Physical Exam:     Sleeping comfortably in bed No new F.N deficits,  Swelling in upper extremities (Right arm > Left)with scattered bruising [place].AT, Normal respiratory effort on room air, minimal crackles heard at bases, CTAB RRR, SEM present, no peripheral edema noted   Abdomen soft, non-distended  I have personally reviewed Paul following:   Data Reviewed:  CBC Recent Labs  Lab 02/22/20 1220 02/23/20 0403 02/24/20 0444 02/25/20 0339 02/26/20 0334  WBC 8.1 6.2 6.3 7.3 8.5  HGB 11.0* 9.7* 9.6* 9.7* 9.5*  HCT 34.5* 29.5* 30.3* 30.2* 29.2*  PLT 203 148* 171 176 169  MCV 86.5 84.3 83.9 85.3 84.1  MCH 27.6 27.7 26.6 27.4 27.4  MCHC 31.9 32.9 31.7 32.1 32.5  RDW 13.0 13.1 13.1 13.5 13.8  LYMPHSABS 0.4*  --   --   --   --   MONOABS 0.4  --   --   --   --   EOSABS 0.0  --   --   --   --   BASOSABS 0.0  --   --   --   --     Chemistries  Recent Labs  Lab 02/22/20 1220 02/22/20 1735 02/22/20 2350 02/23/20 0403 02/24/20 0444 02/25/20 0339 02/26/20 0334  NA 127*   < > 138 136 143 145 145  K 5.1   < > 4.3 4.1 3.8 3.7 3.7  CL 91*   < > 105 107 112* 112* 113*  CO2 19*   < > 19* 21* 22 23 22   GLUCOSE 802*   < > 91 174* 205* 169* 251*  BUN 62*   < > 62* 64*  65* 58* 62*  CREATININE 3.79*   < > 3.39* 3.52* 2.90* 2.48* 2.43*  CALCIUM 9.1   < > 8.7* 8.1* 8.7* 8.9 8.9  MG  --   --   --   --  2.4 2.5* 2.4  AST 154*  --   --  114* 61*  --  21  ALT 55*  --   --  43 40  --  29  ALKPHOS 79  --   --  48 47  --  54  BILITOT 0.9  --   --  0.6 0.6  --  0.8   < > = values in this interval not displayed.   ------------------------------------------------------------------------------------------------------------------ No results for input(s): CHOL, HDL, LDLCALC, TRIG, CHOLHDL, LDLDIRECT in Paul last 72 hours.  Lab Results  Component Value Date   HGBA1C >15.5 (H) 02/22/2020   ------------------------------------------------------------------------------------------------------------------ No results for input(s): TSH, T4TOTAL, T3FREE, THYROIDAB in Paul last 72 hours.  Invalid input(s): FREET3 ------------------------------------------------------------------------------------------------------------------ No results for input(s): VITAMINB12, FOLATE, FERRITIN, TIBC, IRON, RETICCTPCT in Paul last 72 hours.  Coagulation profile Recent Labs  Lab 02/22/20 1220 02/23/20 0403  INR 1.3* 1.3*    No results for input(s): DDIMER in Paul last 72 hours.  Cardiac Enzymes No results for input(s): CKMB, TROPONINI, MYOGLOBIN in Paul last 168 hours.  Invalid input(s): CK ------------------------------------------------------------------------------------------------------------------ No results found for: BNP  Micro Results Recent Results (from Paul past 240 hour(s))  Urine culture     Status: Abnormal   Collection Time: 02/22/20 12:05 PM   Specimen: In/Out Cath Urine  Result Value Ref Range Status   Specimen Description   Final    IN/OUT CATH URINE Performed  at [facility], [address], [place], [place] [address]    Special Requests   Final    NONE Performed at [facility], [address], [place], [place] [address]    Culture 20,000 COLONIES/mL  PSEUDOMONAS AERUGINOSA (A)  Final   Report Status 02/25/2020 FINAL  Final   Organism ID, Bacteria PSEUDOMONAS AERUGINOSA (A)  Final      Susceptibility   Pseudomonas aeruginosa - MIC*    CEFTAZIDIME 4 SENSITIVE Sensitive     CIPROFLOXACIN <=0.25 SENSITIVE Sensitive     GENTAMICIN <=1 SENSITIVE Sensitive     IMIPENEM 2 SENSITIVE Sensitive     PIP/TAZO 8 SENSITIVE Sensitive     CEFEPIME 2 SENSITIVE Sensitive     * 20,000 COLONIES/mL PSEUDOMONAS AERUGINOSA  Respiratory Panel by RT PCR (Flu A&B, Covid) - Nasopharyngeal Swab     Status: None   Collection Time: 02/22/20 12:10 PM   Specimen: Nasopharyngeal Swab  Result Value Ref Range Status   SARS Coronavirus 2 by RT PCR NEGATIVE NEGATIVE Final    Comment: (NOTE) SARS-CoV-2 target nucleic acids are NOT DETECTED.  Paul SARS-CoV-2 RNA is generally detectable in upper respiratoy specimens during Paul acute phase of infection. Paul lowest concentration of SARS-CoV-2 viral copies this assay can detect is 131 copies/mL. A negative result does not preclude SARS-Cov-2 infection and should not be used as Paul sole basis for treatment or other patient management decisions. A negative result may occur with  improper specimen collection/handling, submission of specimen other than nasopharyngeal swab, presence of viral mutation(s) within Paul areas targeted by this assay, and inadequate number of viral copies (<131 copies/mL). A negative result must be combined with clinical observations, patient history, and epidemiological information. Paul expected result is Negative.  Fact Sheet for Patients:  PinkCheek.be  Fact Sheet for Healthcare Providers:  GravelBags.it  This test is no t yet approved or cleared by Paul [place] FDA and  has been authorized for detection and/or diagnosis of SARS-CoV-2 by FDA under an Emergency Use Authorization (EUA). This EUA will remain  in effect (meaning this  test can be used) for Paul duration of Paul COVID-19 declaration under Section 564(b)(1) of Paul Act, 21 U.S.C. section 360bbb-3(b)(1), unless Paul authorization is terminated or revoked sooner.     Influenza A by PCR NEGATIVE NEGATIVE Final   Influenza B by PCR NEGATIVE NEGATIVE Final    Comment: (NOTE) Paul Xpert Xpress SARS-CoV-2/FLU/RSV assay is intended as an aid in  Paul diagnosis of influenza from Nasopharyngeal swab specimens and  should not be used as a sole basis for treatment. Nasal washings and  aspirates are unacceptable for Xpert Xpress SARS-CoV-2/FLU/RSV  testing.  Fact Sheet for Patients: PinkCheek.be  Fact Sheet for Healthcare Providers: GravelBags.it  This test is not yet approved or cleared by Paul [place] FDA and  has been authorized for detection and/or diagnosis of SARS-CoV-2 by  FDA under an Emergency Use Authorization (EUA). This EUA will remain  in effect (meaning this test can be used) for Paul duration of Paul  Covid-19 declaration under Section 564(b)(1) of Paul Act, 21  U.S.C. section 360bbb-3(b)(1), unless Paul authorization is  terminated or revoked. Performed at [facility], [address] [place], [place] [address]   Blood Culture (routine x 2)     Status: Abnormal   Collection Time: 02/22/20 12:20 PM   Specimen: BLOOD LEFT HAND  Result Value Ref Range Status   Specimen Description   Final    BLOOD LEFT  HAND BOTTLES DRAWN AEROBIC AND ANAEROBIC Performed at [facility], [address] [place], [place] [address]    Special Requests   Final    Blood Culture adequate volume Performed at [facility], [address], [place], [place] [address]    Culture  Setup Time   Final    GRAM NEGATIVE RODS Gram Stain Report Called to,Read Back By and Verified With: DANIELS,J @ 0947 ON 02/23/20 BY JUW AEROBIC BOTTLE ONLY GS DONE @ [facility] Performed at [facility], [address], [place], [place]  [address]    Culture (A)  Final    PSEUDOMONAS AERUGINOSA SUSCEPTIBILITIES PERFORMED ON PREVIOUS CULTURE WITHIN Paul LAST 5 DAYS. Performed at [facility], [place] [address], [place], [place] [address]    Report Status 02/25/2020 FINAL  Final  Blood Culture (routine x 2)     Status: Abnormal   Collection Time: 02/22/20 12:20 PM   Specimen: Right Antecubital; Blood  Result Value Ref Range Status   Specimen Description   Final    RIGHT ANTECUBITAL BOTTLES DRAWN AEROBIC AND ANAEROBIC Performed at [facility], [address] [place], [place] [address]    Special Requests   Final    Blood Culture adequate volume Performed at [facility], [address], [place], [place] [address]    Culture  Setup Time   Final    GRAM NEGATIVE RODS Gram Stain Report Called to,Read Back By and Verified With: DANIELS,J @ 4656 ON 02/23/20 BY JUW AEROBIC BOTTLE ONLY GS DONE @ [facility]    Culture PSEUDOMONAS AERUGINOSA (A)  Final   Report Status 02/25/2020 FINAL  Final   Organism ID, Bacteria PSEUDOMONAS AERUGINOSA  Final      Susceptibility   Pseudomonas aeruginosa - MIC*    CEFTAZIDIME 4 SENSITIVE Sensitive     CIPROFLOXACIN <=0.25 SENSITIVE Sensitive     GENTAMICIN <=1 SENSITIVE Sensitive     IMIPENEM 2 SENSITIVE Sensitive     PIP/TAZO 8 SENSITIVE Sensitive     CEFEPIME 2 SENSITIVE Sensitive     * PSEUDOMONAS AERUGINOSA  Blood Culture ID Panel (Reflexed)     Status: Abnormal   Collection Time: 02/22/20 12:20 PM  Result Value Ref Range Status   Enterococcus faecalis NOT DETECTED NOT DETECTED Final   Enterococcus Faecium NOT DETECTED NOT DETECTED Final   Listeria monocytogenes NOT DETECTED NOT DETECTED Final   Staphylococcus species NOT DETECTED NOT DETECTED Final   Staphylococcus aureus (BCID) NOT DETECTED NOT DETECTED Final   Staphylococcus epidermidis NOT DETECTED NOT DETECTED Final   Staphylococcus lugdunensis NOT DETECTED NOT DETECTED Final   Streptococcus species NOT DETECTED NOT DETECTED  Final   Streptococcus agalactiae NOT DETECTED NOT DETECTED Final   Streptococcus pneumoniae NOT DETECTED NOT DETECTED Final   Streptococcus pyogenes NOT DETECTED NOT DETECTED Final   A.calcoaceticus-baumannii NOT DETECTED NOT DETECTED Final   Bacteroides fragilis NOT DETECTED NOT DETECTED Final   Enterobacterales NOT DETECTED NOT DETECTED Final   Enterobacter cloacae complex NOT DETECTED NOT DETECTED Final   Escherichia coli NOT DETECTED NOT DETECTED Final   Klebsiella aerogenes NOT DETECTED NOT DETECTED Final   Klebsiella oxytoca NOT DETECTED NOT DETECTED Final   Klebsiella pneumoniae NOT DETECTED NOT DETECTED Final   Proteus species NOT DETECTED NOT DETECTED Final   Salmonella species NOT DETECTED NOT DETECTED Final   Serratia marcescens NOT DETECTED NOT DETECTED Final   Haemophilus influenzae NOT DETECTED NOT DETECTED Final   Neisseria meningitidis NOT DETECTED NOT DETECTED Final  Pseudomonas aeruginosa DETECTED (A) NOT DETECTED Final    Comment: CRITICAL RESULT CALLED TO, READ BACK BY AND VERIFIED WITH: Hyman Hopes RN, AT 1950 02/23/20 BY D. VANHOOK    Stenotrophomonas maltophilia NOT DETECTED NOT DETECTED Final   Candida albicans NOT DETECTED NOT DETECTED Final   Candida auris NOT DETECTED NOT DETECTED Final   Candida glabrata NOT DETECTED NOT DETECTED Final   Candida krusei NOT DETECTED NOT DETECTED Final   Candida parapsilosis NOT DETECTED NOT DETECTED Final   Candida tropicalis NOT DETECTED NOT DETECTED Final   Cryptococcus neoformans/gattii NOT DETECTED NOT DETECTED Final   CTX-M ESBL NOT DETECTED NOT DETECTED Final   Carbapenem resistance IMP NOT DETECTED NOT DETECTED Final   Carbapenem resistance KPC NOT DETECTED NOT DETECTED Final   Carbapenem resistance NDM NOT DETECTED NOT DETECTED Final   Carbapenem resistance VIM NOT DETECTED NOT DETECTED Final    Comment: Performed at [facility], [address], [place], [place] [address]  MRSA PCR Screening     Status:  None   Collection Time: 02/22/20 12:28 PM   Specimen: Nasopharyngeal  Result Value Ref Range Status   MRSA by PCR NEGATIVE NEGATIVE Final    Comment:        Paul GeneXpert MRSA Assay (FDA approved for NASAL specimens only), is one component of a comprehensive MRSA colonization surveillance program. It is not intended to diagnose MRSA infection nor to guide or monitor treatment for MRSA infections. Performed at [facility], [address] [place], [place] [address]     Radiology Reports DG Thoracic Spine W/Swimmers  Result Date: 02/14/2020 CLINICAL DATA:  Bilateral leg swelling.  No reported back symptoms EXAM: THORACIC SPINE - 3 VIEWS COMPARISON:  None. FINDINGS: Diffuse bridging osteophytes from spondylosis. Generalized disc narrowing. No evidence of fracture, endplate erosion, or focal bone lesion. Maintained posterior mediastinal fat planes when allowing for rotation. IMPRESSION: 1. Generalized spondylosis with multi-level bridging. 2. No acute finding. Electronically Signed   By: Monte Fantasia M.D.   On: 02/14/2020 08:12   DG Lumbar Spine Complete  Result Date: 02/14/2020 CLINICAL DATA:  Leg swelling.  No known injury.  Dementia. EXAM: LUMBAR SPINE - COMPLETE 4+ VIEW COMPARISON:  None. FINDINGS: Diffuse disc narrowing and bulky endplate spurring. Diffuse facet degenerative spurring. Multilevel ankylosis is likely present. No evidence of fracture, erosion or bone lesion. IMPRESSION: 1. No acute finding. 2. Advanced lumbar spine degeneration with multiple bridging osteophytes. Electronically Signed   By: Monte Fantasia M.D.   On: 02/14/2020 08:11   DG Pelvis 1-2 Views  Result Date: 02/14/2020 CLINICAL DATA:  Bilateral leg swelling with no known injury or back pain EXAM: PELVIS - 1-2 VIEW COMPARISON:  August 25, 2017 FINDINGS: Bilateral hip degenerative changes. No signs of acute fracture or bone abnormality. Degenerative changes also noted incidentally in Paul lower lumbar spine.  IMPRESSION: Bilateral hip degenerative changes. No signs of acute fracture. Electronically Signed   By: Zetta Bills M.D.   On: 02/14/2020 08:07   DG Ankle Complete Right  Result Date: 02/14/2020 CLINICAL DATA:  Bilateral foot pain and swelling. EXAM: RIGHT ANKLE - COMPLETE 3+ VIEW COMPARISON:  None. FINDINGS: Nonspecific soft tissue swelling. Profound osteopenia. No acute fracture or subluxation. Bilateral malleolar spurring from enthesophytes. IMPRESSION: 1. Soft tissue swelling without acute osseous finding. 2. Marked osteopenia. Electronically Signed   By: Monte Fantasia M.D.   On: 02/14/2020 07:17   DG CHEST PORT 1 VIEW  Result Date: 02/26/2020 CLINICAL DATA:  Possible aspiration EXAM: PORTABLE CHEST 1 VIEW COMPARISON:  02/22/2020 FINDINGS: Cardiac shadow is stable. Aortic calcifications are again seen. Paul lungs are well aerated bilaterally. No focal infiltrate or sizable effusion is seen. No bony abnormality is noted. IMPRESSION: No acute abnormality seen.  No change from Paul prior exam. Electronically Signed   By: Inez Catalina M.D.   On: 02/26/2020 17:41   DG Chest Port 1 View  Result Date: 02/22/2020 CLINICAL DATA:  Sepsis EXAM: PORTABLE CHEST 1 VIEW COMPARISON:  February 14, 2020 FINDINGS: Paul heart size and mediastinal contours are within normal limits. Both lungs are clear. Paul visualized skeletal structures are unremarkable. IMPRESSION: No active disease. Electronically Signed   By: Abelardo Diesel M.D.   On: 02/22/2020 14:08   DG Chest Portable 1 View  Result Date: 02/14/2020 CLINICAL DATA:  Bilateral foot pain EXAM: PORTABLE CHEST 1 VIEW COMPARISON:  08/25/2017 FINDINGS: Normal heart size and stable aortic tortuosity. There is no edema, consolidation, effusion, or pneumothorax. Artifact from EKG leads. Degenerative endplate spurring. IMPRESSION: No evidence of acute disease. Electronically Signed   By: Monte Fantasia M.D.   On: 02/14/2020 07:06   DG Swallowing Func-Speech  Pathology  Result Date: 02/26/2020 Objective Swallowing Evaluation: Type of Study: MBS-Modified Barium Swallow Study  Patient Details Name: ELIHUE EBERT MRN: [id number] Date of Birth: [birth year] Today's Date: 02/26/2020 Time: SLP Start Time (ACUTE ONLY): 1339 -SLP Stop Time (ACUTE ONLY): 1408 SLP Time Calculation (min) (ACUTE ONLY): 29 min Past Medical History: Past Medical History: Diagnosis Date . Dementia ([place])  . Diabetes mellitus without complication ([place])  . Encephalopathy  . Gastritis 2004 . GIB (gastrointestinal bleeding) 2004 . Hypertension  . Poor historian  . Sinus bradycardia seen on cardiac monitor  Past Surgical History: Past Surgical History: Procedure Laterality Date . INGUINAL HERNIA REPAIR  09/2010  Bilateral . UPPER GASTROINTESTINAL ENDOSCOPY  2004 HPI: This 86 y.o.malewith medical history significant ofDementia,DiabetesMellitus II, Hypertension, CKD stage IV, premature atrial contractions,history of GI bleed, presents in Paul emergency department with confusion and fever. This is patient's third ER visit In last three weeks.Last week he presented in Paul ED with generalized weakness and pain,found to have a UTI . He was discharged on Keflex. Daughter reportshe has been having worsening pain and weakness, was not able to stand up in Paul morning. Patient is admitted for sepsis secondary to Pseudomonas bacteremia.  He was also found to have high anion gap metabolic acidosis secondary to DKA which has been resolved. He is transitioned to subcu insulin. BSE requested.  Subjective: "I want water!" Assessment / Plan / Recommendation CHL IP CLINICAL IMPRESSIONS 02/26/2020 Clinical Impression Pt presents with mild oropharyngeal sensorimotor dysphagia characterized by decreased laryngeal vestibule closure resulting in two episodes of trace silent aspiration of thin liquids via straw and consistent penetration of thin liquids and NTL during Paul swallow that is usually expelled with repeat  swallow of solid textures. Decreased pharyngeal constriction resulting in inconsistent clearance of penetrates however despite remaining in Paul airway, they were only visualized dropping to and below Paul cords twice as mentioned above. Solid textures were consumed without incident and Paul barium tablet was swallowed with thin liquids with noted brief stasis in Paul pyriforms but quickly passed through. RN was present for MBS and BP and HR were stable throughout procedure. Recommend D3/mech soft and Nectar thick liquids; recommend meds whole or crushed in puree as Pt is able. Also recommend free water protocol, * single ice chips after oral care in  between meals *. ST will continue to follow acutely. SLP Visit Diagnosis Dysphagia, unspecified (R13.10) Attention and concentration deficit following -- Frontal lobe and executive function deficit following -- Impact on safety and function Mild aspiration risk;Moderate aspiration risk   CHL IP TREATMENT RECOMMENDATION 02/26/2020 Treatment Recommendations Therapy as outlined in treatment plan below   Prognosis 02/26/2020 Prognosis for Safe Diet Advancement Fair Barriers to Reach Goals Cognitive deficits Barriers/Prognosis Comment -- CHL IP DIET RECOMMENDATION 02/26/2020 SLP Diet Recommendations Dysphagia 3 (Mech soft) solids;Nectar thick liquid Liquid Administration via Cup;No straw Medication Administration Whole meds with puree Compensations Slow rate;Small sips/bites Postural Changes Remain semi-upright after after feeds/meals (Comment);Seated upright at 90 degrees   CHL IP OTHER RECOMMENDATIONS 02/26/2020 Recommended Consults -- Oral Care Recommendations Oral care BID Other Recommendations Order thickener from pharmacy   CHL IP FOLLOW UP RECOMMENDATIONS 02/26/2020 Follow up Recommendations 24 hour supervision/assistance   CHL IP FREQUENCY AND DURATION 02/26/2020 Speech Therapy Frequency (ACUTE ONLY) min 2x/week Treatment Duration 1 week      CHL IP ORAL PHASE 02/26/2020  Oral Phase WFL Oral - Pudding Teaspoon -- Oral - Pudding Cup -- Oral - Honey Teaspoon -- Oral - Honey Cup -- Oral - Nectar Teaspoon -- Oral - Nectar Cup -- Oral - Nectar Straw -- Oral - Thin Teaspoon -- Oral - Thin Cup -- Oral - Thin Straw -- Oral - Puree -- Oral - Mech Soft -- Oral - Regular -- Oral - Multi-Consistency -- Oral - Pill -- Oral Phase - Comment --  CHL IP PHARYNGEAL PHASE 02/26/2020 Pharyngeal Phase Impaired Pharyngeal- Pudding Teaspoon -- Pharyngeal -- Pharyngeal- Pudding Cup -- Pharyngeal -- Pharyngeal- Honey Teaspoon Pharyngeal residue - valleculae;Pharyngeal residue - pyriform;Reduced pharyngeal peristalsis Pharyngeal -- Pharyngeal- Honey Cup -- Pharyngeal -- Pharyngeal- Nectar Teaspoon Reduced pharyngeal peristalsis;Reduced airway/laryngeal closure;Penetration/Aspiration during swallow;Pharyngeal residue - valleculae;Pharyngeal residue - pyriform Pharyngeal Material enters airway, remains ABOVE vocal cords then ejected out Pharyngeal- Nectar Cup Reduced pharyngeal peristalsis;Reduced airway/laryngeal closure;Penetration/Aspiration during swallow;Pharyngeal residue - valleculae;Pharyngeal residue - pyriform Pharyngeal Material enters airway, remains ABOVE vocal cords then ejected out Pharyngeal- Nectar Straw -- Pharyngeal -- Pharyngeal- Thin Teaspoon Delayed swallow initiation-pyriform sinuses;Reduced pharyngeal peristalsis;Reduced epiglottic inversion;Reduced anterior laryngeal mobility;Reduced laryngeal elevation;Reduced airway/laryngeal closure;Reduced tongue base retraction;Penetration/Aspiration during swallow;Pharyngeal residue - valleculae;Pharyngeal residue - pyriform Pharyngeal Material enters airway, remains ABOVE vocal cords and not ejected out Pharyngeal- Thin Cup Delayed swallow initiation-pyriform sinuses;Reduced pharyngeal peristalsis;Reduced epiglottic inversion;Reduced anterior laryngeal mobility;Reduced laryngeal elevation;Reduced airway/laryngeal closure;Reduced tongue base  retraction;Penetration/Aspiration during swallow;Pharyngeal residue - valleculae;Pharyngeal residue - pyriform Pharyngeal Material enters airway, remains ABOVE vocal cords and not ejected out Pharyngeal- Thin Straw Delayed swallow initiation-pyriform sinuses;Reduced pharyngeal peristalsis;Reduced epiglottic inversion;Reduced anterior laryngeal mobility;Reduced laryngeal elevation;Reduced airway/laryngeal closure;Reduced tongue base retraction;Penetration/Aspiration during swallow;Pharyngeal residue - valleculae;Pharyngeal residue - pyriform Pharyngeal Material enters airway, passes BELOW cords without attempt by patient to eject out (silent aspiration) Pharyngeal- Puree WFL Pharyngeal -- Pharyngeal- Mechanical Soft -- Pharyngeal -- Pharyngeal- Regular WFL Pharyngeal -- Pharyngeal- Multi-consistency -- Pharyngeal -- Pharyngeal- Pill Pharyngeal residue - cp segment;Pharyngeal residue - pyriform Pharyngeal -- Pharyngeal Comment --  CHL IP CERVICAL ESOPHAGEAL PHASE 02/26/2020 Cervical Esophageal Phase WFL Pudding Teaspoon -- Pudding Cup -- Honey Teaspoon -- Honey Cup -- Nectar Teaspoon -- Nectar Cup -- Nectar Straw -- Thin Teaspoon -- Thin Cup -- Thin Straw -- Puree -- Mechanical Soft -- Regular -- Multi-consistency -- Pill -- Cervical Esophageal Comment -- Amelia H. Roddie Mc, CCC-SLP Speech Language Pathologist Wende Bushy 02/26/2020, 4:49 PM  US Abdomen Limited RUQ  Result Date: 02/23/2020 CLINICAL DATA:  Elevated liver enzymes EXAM: ULTRASOUND ABDOMEN LIMITED RIGHT UPPER QUADRANT COMPARISON:  August 25, 2017 FINDINGS: Gallbladder: No wall thickening visualized. Gallstones are identified. 0.3 cm polyp is identified. No sonographic Murphy sign noted by sonographer. Common bile duct: Diameter: 0.3 mm Liver: 1.5 x 1.1 x 1.4 cm cyst is identified in Paul left lobe liver. Within normal limits in parenchymal echogenicity. Portal vein is patent on color Doppler imaging with normal direction of blood  flow towards Paul liver. Other: None. IMPRESSION: 1. Cholelithiasis without sonographic evidence of acute cholecystitis. 2. 1.5 cm cyst in Paul left lobe liver. Electronically Signed   By: Abelardo Diesel M.D.   On: 02/23/2020 14:49     Time Spent in minutes  30     Desiree Hane M.D on 02/27/2020 at 8:54 AM  To page go to [email or web address] - password [email or web address]

## 2020-02-27 NOTE — Progress Notes (Signed)
Progress Note  Patient Name: Paul Perez Date of Encounter: 02/27/2020  Primary Cardiologist: Rozann Lesches, MD  Subjective   Resting this morning.  Intermittently moans without specific complaints.  Inpatient Medications    Scheduled Meds: . busPIRone  5 mg Oral BID  . Chlorhexidine Gluconate Cloth  6 each Topical Daily  . cloNIDine  0.1 mg Transdermal Weekly  . docusate sodium  100 mg Oral BID  . donepezil  5 mg Oral QHS  . enoxaparin (LOVENOX) injection  30 mg Subcutaneous Q24H  . furosemide  40 mg Oral Daily  . hydrALAZINE  75 mg Oral TID  . insulin aspart  0-6 Units Subcutaneous TID WC  . insulin aspart  2 Units Subcutaneous TID WC  . insulin detemir  18 Units Subcutaneous Daily  . mouth rinse  15 mL Mouth Rinse BID  . metoprolol tartrate  12.5 mg Oral BID  . senna  1 tablet Oral BID  . sodium chloride flush  3 mL Intravenous Q12H   Continuous Infusions: . ceFEPime (MAXIPIME) IV 200 mL/hr at 02/26/20 1600  . lactated ringers     PRN Meds: acetaminophen **OR** acetaminophen, dextrose, hydrALAZINE, melatonin, morphine injection, ondansetron **OR** ondansetron (ZOFRAN) IV   Vital Signs    Vitals:   02/27/20 0100 02/27/20 0400 02/27/20 0500 02/27/20 0719  BP: (!) 165/33     Pulse: 69   68  Resp: (!) 24   (!) 21  Temp:  98.7 F (37.1 C)  (!) 100.4 F (38 C)  TempSrc:  Axillary  Axillary  SpO2: 98%   99%  Weight:   86.5 kg   Height:        Intake/Output Summary (Last 24 hours) at 02/27/2020 0817 Last data filed at 02/26/2020 1812 Gross per 24 hour  Intake 111 ml  Output 402 ml  Net -291 ml   Filed Weights   02/25/20 0434 02/26/20 0404 02/27/20 0500  Weight: 87.1 kg 86.9 kg 86.5 kg    Telemetry    Sinus rhythm and sinus tachycardia, rare PVCs.  Personally reviewed.  ECG    No new tracings for review..  Physical Exam   GEN:  Elderly male, appears calm. Neck: No JVD. Cardiac:  RRR with soft without murmur, no gallop.  Respiratory:  Nonlabored.  Clear to auscultation anteriorly. GI: Soft, nontender, bowel sounds present. MS:  No edema. Psych: Alert and oriented x 1.  Labs    Chemistry Recent Labs  Lab 02/23/20 0403 02/23/20 0403 02/24/20 0444 02/25/20 0339 02/26/20 0334  NA 136   < > 143 145 145  K 4.1   < > 3.8 3.7 3.7  CL 107   < > 112* 112* 113*  CO2 21*   < > 22 23 22   GLUCOSE 174*   < > 205* 169* 251*  BUN 64*   < > 65* 58* 62*  CREATININE 3.52*   < > 2.90* 2.48* 2.43*  CALCIUM 8.1*   < > 8.7* 8.9 8.9  PROT 5.4*  --  5.5*  --  5.7*  ALBUMIN 2.5*  --  2.2*  --  2.2*  AST 114*  --  61*  --  21  ALT 43  --  40  --  29  ALKPHOS 48  --  47  --  54  BILITOT 0.6  --  0.6  --  0.8  GFRNONAA 14*   < > 18* 22* 23*  ANIONGAP 8   < > 9 10  10   < > = values in this interval not displayed.     Hematology Recent Labs  Lab 02/24/20 0444 02/25/20 0339 02/26/20 0334  WBC 6.3 7.3 8.5  RBC 3.61* 3.54* 3.47*  HGB 9.6* 9.7* 9.5*  HCT 30.3* 30.2* 29.2*  MCV 83.9 85.3 84.1  MCH 26.6 27.4 27.4  MCHC 31.7 32.1 32.5  RDW 13.1 13.5 13.8  PLT 171 176 169    Patient Profile     86 y.o. male with a history of dementia, hypertension, upper GI bleed, type 2 diabetes mellitus, previously documented bradycardia in the setting of rhabdomyolysis, currently admitted with Pseudomonas bacteremia sepsis complicated by electrolyte abnormalities, acute on chronic renal failure, and DKA.  Cardiology consulted for transient episode of atrial fibrillation with RVR.  Assessment & Plan    1.  Transient episodes of atrial fibrillation with RVR, noted during swallowing assessment and subsequent episode of eating.  He had a swallow evaluation yesterday, no recurring arrhythmias.  Continues on oral Lopressor.  Suboptimal candidate for anticoagulation with dementia at age 86 and prior history of GI bleed.  2.  Dementia, on Aricept.  3.  Essential hypertension, on hydralazine and clonidine.  Blood pressure remains elevated,  hydralazine dose was increased yesterday.  4.  Acute on chronic renal failure with CKD stage IIIb at baseline.  Creatinine down to 2.43.  5.  Sepsis due to Pseudomonas bacteremia, currently on cefepime per primary team.  Continue oral Lopressor which could be potentially uptitrated if needed, although heart rate in sinus rhythm is reasonable.  Hydralazine dose was increased yesterday, continue clonidine.  May need addition of Norvasc if blood pressure does not continue to improve.  Would hold off on addition of amiodarone unless atrial fibrillation remains a recurring problem.  As noted previously, he is not a good candidate for anticoagulation.  Signed, Rozann Lesches, MD  02/27/2020, 8:17 AM

## 2020-02-27 NOTE — TOC Progression Note (Signed)
Transition of Care [facility]) - Progression Note    Patient Details  Name: Paul Perez MRN: [id number] Date of Birth: Nov 11, 1927  Transition of Care [facility]) CM/SW Contact  Natasha Bence, LCSW Phone Number: 02/27/2020, 9:42 AM  Clinical Narrative:     To Whom It may Concern:  Please be advised that the above named patient has a primary diagnosis of demential which supersedes any psychiatric diagnosis.    Expected Discharge Plan: Skilled Nursing Facility Barriers to Discharge: ED Awaiting State Approval [facility])  Expected Discharge Plan and Services Expected Discharge Plan: [place] arrangements for the past 2 months: Assisted Living Facility                                       Social Determinants of Health (SDOH) Interventions    Readmission Risk Interventions No flowsheet data found.

## 2020-02-28 DIAGNOSIS — R7881 Bacteremia: Secondary | ICD-10-CM | POA: Diagnosis not present

## 2020-02-28 DIAGNOSIS — G9341 Metabolic encephalopathy: Secondary | ICD-10-CM | POA: Diagnosis not present

## 2020-02-28 DIAGNOSIS — I4891 Unspecified atrial fibrillation: Secondary | ICD-10-CM | POA: Diagnosis not present

## 2020-02-28 DIAGNOSIS — F0390 Unspecified dementia without behavioral disturbance: Secondary | ICD-10-CM | POA: Diagnosis not present

## 2020-02-28 DIAGNOSIS — B965 Pseudomonas (aeruginosa) (mallei) (pseudomallei) as the cause of diseases classified elsewhere: Secondary | ICD-10-CM | POA: Diagnosis not present

## 2020-02-28 LAB — GLUCOSE, CAPILLARY
Glucose-Capillary: 276 mg/dL — ABNORMAL HIGH (ref 70–99)
Glucose-Capillary: 210 mg/dL — ABNORMAL HIGH (ref 70–99)
Glucose-Capillary: 138 mg/dL — ABNORMAL HIGH (ref 70–99)
Glucose-Capillary: 136 mg/dL — ABNORMAL HIGH (ref 70–99)

## 2020-02-28 LAB — CBC
HCT: 26 % — ABNORMAL LOW (ref 39.0–52.0)
Hemoglobin: 8.1 g/dL — ABNORMAL LOW (ref 13.0–17.0)
MCH: 27 pg (ref 26.0–34.0)
MCHC: 31.2 g/dL (ref 30.0–36.0)
MCV: 86.7 fL (ref 80.0–100.0)
Platelets: 216 10*3/uL (ref 150–400)
RBC: 3 MIL/uL — ABNORMAL LOW (ref 4.22–5.81)
RDW: 14.6 % (ref 11.5–15.5)
WBC: 10.7 10*3/uL — ABNORMAL HIGH (ref 4.0–10.5)
nRBC: 0 % (ref 0.0–0.2)

## 2020-02-28 LAB — BASIC METABOLIC PANEL
Anion gap: 11 (ref 5–15)
Anion gap: 7 (ref 5–15)
Anion gap: 11 (ref 5–15)
BUN: 77 mg/dL — ABNORMAL HIGH (ref 8–23)
BUN: 28 mg/dL — ABNORMAL HIGH (ref 8–23)
BUN: 76 mg/dL — ABNORMAL HIGH (ref 8–23)
CO2: 23 mmol/L (ref 22–32)
CO2: 25 mmol/L (ref 22–32)
CO2: 25 mmol/L (ref 22–32)
Calcium: 8.9 mg/dL (ref 8.9–10.3)
Calcium: 8.1 mg/dL — ABNORMAL LOW (ref 8.9–10.3)
Calcium: 8.9 mg/dL (ref 8.9–10.3)
Chloride: 113 mmol/L — ABNORMAL HIGH (ref 98–111)
Chloride: 96 mmol/L — ABNORMAL LOW (ref 98–111)
Chloride: 113 mmol/L — ABNORMAL HIGH (ref 98–111)
Creatinine, Ser: 2.88 mg/dL — ABNORMAL HIGH (ref 0.61–1.24)
Creatinine, Ser: 1.04 mg/dL (ref 0.61–1.24)
Creatinine, Ser: 2.81 mg/dL — ABNORMAL HIGH (ref 0.61–1.24)
GFR, Estimated: 18 mL/min — ABNORMAL LOW (ref >60)
GFR, Estimated: >60 mL/min (ref >60)
GFR, Estimated: 19 mL/min — ABNORMAL LOW (ref >60)
Glucose, Bld: 258 mg/dL — ABNORMAL HIGH (ref 70–99)
Glucose, Bld: 117 mg/dL — ABNORMAL HIGH (ref 70–99)
Glucose, Bld: 177 mg/dL — ABNORMAL HIGH (ref 70–99)
Potassium: 3.8 mmol/L (ref 3.5–5.1)
Potassium: 3.6 mmol/L (ref 3.5–5.1)
Potassium: 3.4 mmol/L — ABNORMAL LOW (ref 3.5–5.1)
Sodium: 147 mmol/L — ABNORMAL HIGH (ref 135–145)
Sodium: 128 mmol/L — ABNORMAL LOW (ref 135–145)
Sodium: 149 mmol/L — ABNORMAL HIGH (ref 135–145)

## 2020-02-28 MED ORDER — INSULIN ASPART 100 UNIT/ML ~~LOC~~ SOLN
5.0000 [IU] | Freq: Three times a day (TID) | SUBCUTANEOUS | Status: DC
  Administered 2020-02-29 – 2020-03-05 (×10): 5 [IU] via SUBCUTANEOUS

## 2020-02-28 MED ORDER — INSULIN DETEMIR 100 UNIT/ML ~~LOC~~ SOLN
20.0000 [IU] | Freq: Every day | SUBCUTANEOUS | Status: DC
  Administered 2020-02-29 – 2020-03-06 (×7): 20 [IU] via SUBCUTANEOUS
  Filled 2020-02-28 (×10): qty 0.2

## 2020-02-28 MED ORDER — DEXTROSE 5 % IV SOLN: INTRAVENOUS | Status: AC

## 2020-02-28 NOTE — Care Management Important Message (Signed)
Important Message  Patient Details  Name: Paul Perez MRN: [id number] Date of Birth: June 18, 1927   Medicare Important Message Given:  Yes     Tommy Medal 02/28/2020, 4:26 PM

## 2020-02-28 NOTE — Progress Notes (Signed)
Progress Note  Patient Name: Paul Perez Date of Encounter: 02/28/2020  Primary Cardiologist: Rozann Lesches, MD  Subjective   Patient sleeping   Does not awakne with nudge  Appears comfortable   Inpatient Medications    Scheduled Meds: . busPIRone  5 mg Oral BID  . Chlorhexidine Gluconate Cloth  6 each Topical Daily  . cloNIDine  0.1 mg Transdermal Weekly  . docusate sodium  100 mg Oral BID  . donepezil  5 mg Oral QHS  . enoxaparin (LOVENOX) injection  30 mg Subcutaneous Q24H  . furosemide  40 mg Oral Daily  . hydrALAZINE  75 mg Oral TID  . insulin aspart  0-9 Units Subcutaneous TID WC  . insulin aspart  3 Units Subcutaneous TID WC  . insulin detemir  18 Units Subcutaneous Daily  . mouth rinse  15 mL Mouth Rinse BID  . metoprolol tartrate  25 mg Oral BID  . senna  1 tablet Oral BID  . sodium chloride flush  3 mL Intravenous Q12H   Continuous Infusions: . ceFEPime (MAXIPIME) IV Stopped (02/27/20 1530)  . dextrose 75 mL/hr at 02/28/20 0921  . lactated ringers     PRN Meds: acetaminophen **OR** acetaminophen, dextrose, hydrALAZINE, melatonin, morphine injection, ondansetron **OR** ondansetron (ZOFRAN) IV   Vital Signs    Vitals:   02/27/20 2100 02/27/20 2134 02/28/20 0029 02/28/20 0840  BP: (!) 160/95 (!) 160/95 (!) 135/51 (!) 181/60  Pulse: 60 64 62 76  Resp: 12  18 18   Temp:   98.3 F (36.8 C)   TempSrc:   Oral   SpO2: 98%  97% 100%  Weight:   83.1 kg   Height:        Intake/Output Summary (Last 24 hours) at 02/28/2020 1102 Last data filed at 02/28/2020 0900 Gross per 24 hour  Intake 920 ml  Output 600 ml  Net 320 ml   Filed Weights   02/26/20 0404 02/27/20 0500 02/28/20 0029  Weight: 86.9 kg 86.5 kg 83.1 kg    Telemetry    SR  Personally reviewed.  ECG    No new tracings for review..  Physical Exam   GEN:  Elderly male, appears calm. Neck: JVP is normal   Cardiac:  RRR S1, S2  No S3  No signif murmurs   Respiratory:  Nonlabored.  Clear to auscultation anteriorly. GI: Soft, nontender, bowel sounds present. MS:  No edema.   Labs    Chemistry Recent Labs  Lab 02/23/20 0403 02/23/20 0403 02/24/20 0444 02/25/20 0339 02/26/20 0334 02/27/20 0953 02/28/20 0542  NA 136   < > 143   < > 145 147* 147*  K 4.1   < > 3.8   < > 3.7 3.6 3.8  CL 107   < > 112*   < > 113* 112* 113*  CO2 21*   < > 22   < > 22 22 23   GLUCOSE 174*   < > 205*   < > 251* 392* 258*  BUN 64*   < > 65*   < > 62* 70* 77*  CREATININE 3.52*   < > 2.90*   < > 2.43* 2.83* 2.88*  CALCIUM 8.1*   < > 8.7*   < > 8.9 9.1 8.9  PROT 5.4*  --  5.5*  --  5.7*  --   --   ALBUMIN 2.5*  --  2.2*  --  2.2*  --   --   AST 114*  --  61*  --  21  --   --   ALT 43  --  40  --  29  --   --   ALKPHOS 48  --  47  --  54  --   --   BILITOT 0.6  --  0.6  --  0.8  --   --   GFRNONAA 14*   < > 18*   < > 23* 19* 18*  ANIONGAP 8   < > 9   < > 10 13 11    < > = values in this interval not displayed.     Hematology Recent Labs  Lab 02/26/20 0334 02/27/20 0953 02/28/20 0542  WBC 8.5 10.7* 10.7*  RBC 3.47* 3.46* 3.00*  HGB 9.5* 9.3* 8.1*  HCT 29.2* 29.8* 26.0*  MCV 84.1 86.1 86.7  MCH 27.4 26.9 27.0  MCHC 32.5 31.2 31.2  RDW 13.8 14.3 14.6  PLT 169 203 216    Patient Profile     86 y.o. male with a history of dementia, hypertension, upper GI bleed, type 2 diabetes mellitus, previously documented bradycardia in the setting of rhabdomyolysis, currently admitted with Pseudomonas bacteremia sepsis complicated by electrolyte abnormalities, acute on chronic renal failure, and DKA.  Cardiology consulted for transient episode of atrial fibrillation with RVR.  Assessment & Plan    1. Afib with RVR   Transient  REmains in SR   Keep on same meds   With demntia and Hx of GI bleed and age I do not think he is a good candidate for anticoagulation    2.  Dementia, on Aricept.  3.  Essential hypertension, on hydralazine and clonidine.  BP is better today  I  would not push too low     4.  Acute on chronic renal failure with CKD.  Creatinine up to 2.88  Follow   Note getting fluid challenge  Repeat later   Watch I/O    5.  Sepsis due to Pseudomonas bacteremia, currently on cefepime per primary team.  Pt on IV flud  75 cc / hour for 8 hours Need to  be very careful   Follow I/O  Signed, Dorris Carnes, MD  02/28/2020, 11:02 AM

## 2020-02-28 NOTE — Progress Notes (Signed)
Physical Therapy Treatment Patient Details Name: Paul Perez MRN: [id number] DOB: 1928/03/19 Today's Date: 02/28/2020    History of Present Illness Paul Perez is a 86 y.o. male with medical history significant of Dementia, Diabetes Mellitus II,  Hypertension, CKD stage IV, premature atrial contractions , history of GI bleed, presents in the emergency department with confusion and fever. History is obtained from ED chart. Patient is demented,  unable to provide history.  History is also obtained from daughter Paul Perez over the phone.  This is patient's third ER visit  In last three weeks.  Last week he presented in the ED with generalized weakness and pain,  found to have a UTI . He was discharged on Keflex.  Daughter reports he has been having worsening pain and weakness, was not able to stand up in the morning.  He was brought in the emergency department the morning.    PT Comments    Patient very lethargic today and responds intermittently to verbal and tactile cueing. Patient states pain throughout session but does not verbalize where. Patient requires max assist for rolling today secondary to fatigue and lethargy. Patient repositioned on side at end of session to off weight back and buttock to reduce risk of pressure injury. Patient will benefit from continued physical therapy in hospital and recommended venue below to increase strength, balance, endurance for safe ADLs and gait.    Follow Up Recommendations  SNF     Equipment Recommendations  None recommended by PT    Recommendations for Other Services       Precautions / Restrictions Precautions Precautions: Fall Restrictions Weight Bearing Restrictions: No    Mobility  Bed Mobility Overal bed mobility: Needs Assistance Bed Mobility: Rolling Rolling: Max assist;Total assist         General bed mobility comments: increased time, labored movement, limited participation due to lethargy  Transfers                     Ambulation/Gait                 Stairs             Wheelchair Mobility    Modified Rankin (Stroke Patients Only)       Balance                                            Cognition Arousal/Alertness: Lethargic Behavior During Therapy: Flat affect;Anxious Overall Cognitive Status: No family/caregiver present to determine baseline cognitive functioning                                        Exercises      General Comments        Pertinent Vitals/Pain Pain Assessment: Faces Faces Pain Scale: Hurts little more Pain Descriptors / Indicators: Grimacing Pain Intervention(s): Limited activity within patient's tolerance;Monitored during session;Repositioned    Home Living                      Prior Function            PT Goals (current goals can now be found in the care plan section) Acute Rehab PT Goals Patient Stated Goal: not stated PT Goal Formulation: With patient Time For  Goal Achievement: 03/11/20 Potential to Achieve Goals: Fair Progress towards PT goals: Progressing toward goals    Frequency    Min 3X/week      PT Plan Current plan remains appropriate    Co-evaluation              AM-PAC PT "6 Clicks" Mobility   Outcome Measure  Help needed turning from your back to your side while in a flat bed without using bedrails?: A Lot Help needed moving from lying on your back to sitting on the side of a flat bed without using bedrails?: A Lot Help needed moving to and from a bed to a chair (including a wheelchair)?: Total Help needed standing up from a chair using your arms (e.g., wheelchair or bedside chair)?: Total Help needed to walk in hospital room?: Total Help needed climbing 3-5 steps with a railing? : Total 6 Click Score: 8    End of Session   Activity Tolerance: Patient limited by fatigue;Patient limited by pain;Patient limited by lethargy Patient left: in bed;with  call bell/phone within reach;with bed alarm set Nurse Communication: Mobility status PT Visit Diagnosis: Unsteadiness on feet (R26.81);Other abnormalities of gait and mobility (R26.89);Muscle weakness (generalized) (M62.81)     Time: 5038-8828 PT Time Calculation (min) (ACUTE ONLY): 8 min  Charges:  $Therapeutic Activity: 8-22 mins                     11:38 AM, 02/28/20 Mearl Latin PT, DPT Physical Therapist at [facility]

## 2020-02-28 NOTE — Progress Notes (Signed)
  Speech Language Pathology Treatment: Dysphagia  Patient Details Name: Paul Perez MRN: [id number] DOB: [birth year] Today's Date: 02/28/2020 Time: 2778-2423 SLP Time Calculation (min) (ACUTE ONLY): 21 min  Assessment / Plan / Recommendation Clinical Impression  Ongoing diagnostic dysphagia therapy provided targeting trials of thin liquids; Pt required max cues to rouse for PO trials and only limited trials were provided secondary to lethargy this am. Pt consumed thin liquids via cup with decreased oral control, audible swallows, wet vocal quality and noted multiple swallows. Recommend continue diet as ordered D3/mech soft and NTL again note as previous ST note reported prognosis for advancement to thin liquids is good with improved alertness.   HPI HPI: This 86 y.o.malewith medical history significant ofDementia,DiabetesMellitus II, Hypertension, CKD stage IV, premature atrial contractions,history of GI bleed, presents in the emergency department with confusion and fever. This is patient's third ER visit In last three weeks.Last week he presented in the ED with generalized weakness and pain,found to have a UTI . He was discharged on Keflex. Daughter reportshe has been having worsening pain and weakness, was not able to stand up in the morning. Patient is admitted for sepsis secondary to Pseudomonas bacteremia.  He was also found to have high anion gap metabolic acidosis secondary to DKA which has been resolved. He is transitioned to subcu insulin. BSE requested.       SLP Plan  Continue with current plan of care       Recommendations  Diet recommendations: Nectar-thick liquid;Dysphagia 3 (mechanical soft) Liquids provided via: Cup;Straw Medication Administration: Whole meds with puree Supervision: Staff to assist with self feeding;Full supervision/cueing for compensatory strategies Compensations: Slow rate;Small sips/bites Postural Changes and/or Swallow Maneuvers:  Seated upright 90 degrees;Upright 30-60 min after meal                Oral Care Recommendations: Oral care BID;Staff/trained caregiver to provide oral care Follow up Recommendations: 24 hour supervision/assistance SLP Visit Diagnosis: Dysphagia, oropharyngeal phase (R13.12) Plan: Continue with current plan of care       Rubee Vega H. Roddie Mc, CCC-SLP Speech Language Pathologist    Wende Bushy 02/28/2020, 11:54 AM

## 2020-02-28 NOTE — Progress Notes (Signed)
[facility]  PROGRESS NOTE  [name] MWU:[id number] DOB: 1927/11/22 DOA: 02/22/2020 PCP: Alanson Puls [facility] date - 02/22/2020   Admitting Physician Shawna Clamp, MD  Outpatient Primary MD for the patient is [facility]  LOS - 6 Brief Narrative   Paul Perez is a 86 y.o. year old male with medical history significant for Beatties, dementia, HTN, CKDStage IV, PACs who presented on 2/9 with confusion and fever after recently being treated for UTI with Keflex and was found to have severe sepsis secondary to Pseudomonas bacteremia.  Hospital course complicated by DKA, paroxysmal atrial fibrillation, oropharyngeal dysphagia.  Subjective  No acute events overnight.  Reported patient did well with diet.  This morning has no acute complaints  A & P  Mild oropharyngeal dysphagia.  Underwent barium swallow on 10/13.  Chest x-ray on 10/13 unremarkable -Speech recommends dysphagia 3 diet, closely monitor. -Aspiration precautions  Paroxysmal atrial fibrillation with RVR, currently in normal sinus rhythm and rate controlled.  Occurred in setting of potential aspiration event. -Appreciate cardiology recommendations, continue oral Lopressor -Not a candidate for anticoagulation given dementia, and prior history of GI bleed   Hypertension, elevated but improved in the last 24 hours after increase in Lopressor.  SBP now in the 160s -Continue current dose of hydralazine 75 mg 3 times daily, tolerating Lopressor 25 mg twice daily -Continue to monitor amlodipine may be necessary if remains elevated -Clonidine patch, Lasix  Severe sepsis secondary to pseudomonal bacteremia, resolved sepsis physiology resolved, had low-grade fever 100.4 on 10/14 -Continue IV cefepime for total of 7days, end date 10/17 -Repeat  blood cultures have been unremarkable so far  DKA, resolved Type 2 diabetes, poorly controlled A1c greater than 15.  CBGs in the 250s but he did have a  value of 140s - Will increase Levemir to 20 units given fasting blood sugar still quite elevated in the 270s, increase NovoLog to 5 units 3 times daily as long as eating greater than 50% of meals -NovoLog 2 units 3 times daily with meals -Sliding scale as needed with meals  Mood disorder, stable -BuSpar  Hypernatremia, mild, recurrent.  Sodium 147.  Suspect decreased oral intake in the setting of altered mental status -Resume D5 over 8 hours, repeat BMP this afternoon -Monitor BMP daily  Elevated liver enzymes, resolved.  Right upper quad ultrasound showed cholelithiasis.  CKD, stage IV, creatinine stable at baseline.  Did have peak creatinine of 3.58 during hospitalization for which nephrology recommended continue supportive care -Avoid nephrotoxic agents  Acute encephalopathy, multifactorial (infectious, metabolic), waxes and wanes.  Alert to self this morning, intermittently able to tell me his name and that he is in no pain.  In setting of dementia for which baseline is able to have conversation per daughter.  Likely further complicated by hospital-acquired delirium -Delirium precautions -Expect improvement with treatment of medical issues -Continue home Aricept -Continue goals of care discussions  Goals of care  there is no healthcare power of attorney  decisions for him to be made jointly amongst siblings I have had conversations with his daughter [name], --currently full code/full scope    Family Communication  : Spoke with daughter [name] at bedside on 10/14  Code Status : Full code, discussed patient's desire of advanced directives with [name] who states he has no specific directives.  She has initiated conversations with her brothers regarding goals of care  Disposition Plan  :  Patient is from ALF. Anticipated d/c date: 2 to  3 days. Barriers to d/c or necessity for inpatient status:  Needs to continue IV cefepime, close monitoring of A. fib and hypernatremia, close  monitoring of BP and mental status.  Once medically stable plan for SNF Consults  : Cardiology  Procedures  : Barium swallow, 10/13  DVT Prophylaxis  :  Lovenox   MDM: The below labs and imaging reports were reviewed and summarized above.  Medication management as above.  Lab Results  Component Value Date   PLT 216 02/28/2020    Diet :  Diet Order            DIET DYS 3 Room service appropriate? Yes; Fluid consistency: Nectar Thick  Diet effective now                  Inpatient Medications Scheduled Meds: . busPIRone  5 mg Oral BID  . Chlorhexidine Gluconate Cloth  6 each Topical Daily  . cloNIDine  0.1 mg Transdermal Weekly  . docusate sodium  100 mg Oral BID  . donepezil  5 mg Oral QHS  . enoxaparin (LOVENOX) injection  30 mg Subcutaneous Q24H  . furosemide  40 mg Oral Daily  . hydrALAZINE  75 mg Oral TID  . insulin aspart  0-9 Units Subcutaneous TID WC  . insulin aspart  3 Units Subcutaneous TID WC  . insulin detemir  18 Units Subcutaneous Daily  . mouth rinse  15 mL Mouth Rinse BID  . metoprolol tartrate  25 mg Oral BID  . senna  1 tablet Oral BID  . sodium chloride flush  3 mL Intravenous Q12H   Continuous Infusions: . ceFEPime (MAXIPIME) IV Stopped (02/27/20 1530)  . dextrose 75 mL/hr at 02/28/20 0921  . lactated ringers     PRN Meds:.acetaminophen **OR** acetaminophen, dextrose, hydrALAZINE, melatonin, morphine injection, ondansetron **OR** ondansetron (ZOFRAN) IV  Antibiotics  :   Anti-infectives (From admission, onward)   Start     Dose/Rate Route Frequency Ordered Stop   02/24/20 1300  vancomycin (VANCOCIN) IVPB 1000 mg/200 mL premix  Status:  Discontinued        1,000 mg 200 mL/hr over 60 Minutes Intravenous Every 48 hours 02/22/20 1331 02/24/20 0839   02/23/20 1500  ceFEPIme (MAXIPIME) 2 g in sodium chloride 0.9 % 100 mL IVPB        2 g 200 mL/hr over 30 Minutes Intravenous Every 24 hours 02/22/20 1457 02/29/20 1459   02/22/20 2000  metroNIDAZOLE  (FLAGYL) IVPB 500 mg  Status:  Discontinued        500 mg 100 mL/hr over 60 Minutes Intravenous Every 8 hours 02/22/20 1549 02/24/20 0839   02/22/20 1300  vancomycin (VANCOREADY) IVPB 2000 mg/400 mL        2,000 mg 200 mL/hr over 120 Minutes Intravenous  Once 02/22/20 1219 02/22/20 1545   02/22/20 1215  ceFEPIme (MAXIPIME) 2 g in sodium chloride 0.9 % 100 mL IVPB        2 g 200 mL/hr over 30 Minutes Intravenous  Once 02/22/20 1207 02/22/20 1406   02/22/20 1215  metroNIDAZOLE (FLAGYL) IVPB 500 mg        500 mg 100 mL/hr over 60 Minutes Intravenous  Once 02/22/20 1207 02/22/20 1406   02/22/20 1215  vancomycin (VANCOCIN) IVPB 1000 mg/200 mL premix  Status:  Discontinued        1,000 mg 200 mL/hr over 60 Minutes Intravenous  Once 02/22/20 1207 02/22/20 1219       Objective  Vitals:   02/27/20 2100 02/27/20 2134 02/28/20 0029 02/28/20 0840  BP: (!) 160/95 (!) 160/95 (!) 135/51 (!) 181/60  Pulse: 60 64 62 76  Resp: 12  18 18   Temp:   98.3 F (36.8 C)   TempSrc:   Oral   SpO2: 98%  97% 100%  Weight:   83.1 kg   Height:        SpO2: 100 % O2 Flow Rate (L/min): 2 L/min  Wt Readings from Last 3 Encounters:  02/28/20 83.1 kg  02/17/20 90.7 kg  02/14/20 87.1 kg     Intake/Output Summary (Last 24 hours) at 02/28/2020 1255 Last data filed at 02/28/2020 0900 Gross per 24 hour  Intake 460 ml  Output 600 ml  Net -140 ml    Physical Exam:  Lying in bed comfortably, able to tell me he is not in any pain and to me his name and shake my hand No new F.N deficits,  Swelling in upper extremities (Right arm > Left)with scattered bruising [place].AT, Normal respiratory effort on room air, minimal crackles heard at bases, CTAB RRR, SEM present, no peripheral edema noted   Abdomen soft, non-distended  I have personally reviewed the following:   Data Reviewed:  CBC Recent Labs  Lab 02/22/20 1220 02/23/20 0403 02/24/20 0444 02/25/20 0339 02/26/20 0334 02/27/20 0953  02/28/20 0542  WBC 8.1   < > 6.3 7.3 8.5 10.7* 10.7*  HGB 11.0*   < > 9.6* 9.7* 9.5* 9.3* 8.1*  HCT 34.5*   < > 30.3* 30.2* 29.2* 29.8* 26.0*  PLT 203   < > 171 176 169 203 216  MCV 86.5   < > 83.9 85.3 84.1 86.1 86.7  MCH 27.6   < > 26.6 27.4 27.4 26.9 27.0  MCHC 31.9   < > 31.7 32.1 32.5 31.2 31.2  RDW 13.0   < > 13.1 13.5 13.8 14.3 14.6  LYMPHSABS 0.4*  --   --   --   --   --   --   MONOABS 0.4  --   --   --   --   --   --   EOSABS 0.0  --   --   --   --   --   --   BASOSABS 0.0  --   --   --   --   --   --    < > = values in this interval not displayed.    Chemistries  Recent Labs  Lab 02/22/20 1220 02/22/20 1735 02/23/20 0403 02/23/20 0403 02/24/20 0444 02/25/20 0339 02/26/20 0334 02/27/20 0953 02/28/20 0542  NA 127*   < > 136   < > 143 145 145 147* 147*  K 5.1   < > 4.1   < > 3.8 3.7 3.7 3.6 3.8  CL 91*   < > 107   < > 112* 112* 113* 112* 113*  CO2 19*   < > 21*   < > 22 23 22 22 23   GLUCOSE 802*   < > 174*   < > 205* 169* 251* 392* 258*  BUN 62*   < > 64*   < > 65* 58* 62* 70* 77*  CREATININE 3.79*   < > 3.52*   < > 2.90* 2.48* 2.43* 2.83* 2.88*  CALCIUM 9.1   < > 8.1*   < > 8.7* 8.9 8.9 9.1 8.9  MG  --   --   --   --  2.4 2.5* 2.4  --   --   AST 154*  --  114*  --  61*  --  21  --   --   ALT 55*  --  43  --  40  --  29  --   --   ALKPHOS 79  --  48  --  47  --  54  --   --   BILITOT 0.9  --  0.6  --  0.6  --  0.8  --   --    < > = values in this interval not displayed.   ------------------------------------------------------------------------------------------------------------------ No results for input(s): CHOL, HDL, LDLCALC, TRIG, CHOLHDL, LDLDIRECT in the last 72 hours.  Lab Results  Component Value Date   HGBA1C >15.5 (H) 02/22/2020   ------------------------------------------------------------------------------------------------------------------ No results for input(s): TSH, T4TOTAL, T3FREE, THYROIDAB in the last 72 hours.  Invalid input(s):  FREET3 ------------------------------------------------------------------------------------------------------------------ No results for input(s): VITAMINB12, FOLATE, FERRITIN, TIBC, IRON, RETICCTPCT in the last 72 hours.  Coagulation profile Recent Labs  Lab 02/22/20 1220 02/23/20 0403  INR 1.3* 1.3*    No results for input(s): DDIMER in the last 72 hours.  Cardiac Enzymes No results for input(s): CKMB, TROPONINI, MYOGLOBIN in the last 168 hours.  Invalid input(s): CK ------------------------------------------------------------------------------------------------------------------ No results found for: BNP  Micro Results Recent Results (from the past 240 hour(s))  Urine culture     Status: Abnormal   Collection Time: 02/22/20 12:05 PM   Specimen: In/Out Cath Urine  Result Value Ref Range Status   Specimen Description   Final    IN/OUT CATH URINE Performed at [facility], [address], [place], [place] [address]    Special Requests   Final    NONE Performed at [facility], [address], [place], [place] [address]    Culture 20,000 COLONIES/mL PSEUDOMONAS AERUGINOSA (A)  Final   Report Status 02/25/2020 FINAL  Final   Organism ID, Bacteria PSEUDOMONAS AERUGINOSA (A)  Final      Susceptibility   Pseudomonas aeruginosa - MIC*    CEFTAZIDIME 4 SENSITIVE Sensitive     CIPROFLOXACIN <=0.25 SENSITIVE Sensitive     GENTAMICIN <=1 SENSITIVE Sensitive     IMIPENEM 2 SENSITIVE Sensitive     PIP/TAZO 8 SENSITIVE Sensitive     CEFEPIME 2 SENSITIVE Sensitive     * 20,000 COLONIES/mL PSEUDOMONAS AERUGINOSA  Respiratory Panel by RT PCR (Flu A&B, Covid) - Nasopharyngeal Swab     Status: None   Collection Time: 02/22/20 12:10 PM   Specimen: Nasopharyngeal Swab  Result Value Ref Range Status   SARS Coronavirus 2 by RT PCR NEGATIVE NEGATIVE Final    Comment: (NOTE) SARS-CoV-2 target nucleic acids are NOT DETECTED.  The SARS-CoV-2 RNA is generally detectable in upper  respiratoy specimens during the acute phase of infection. The lowest concentration of SARS-CoV-2 viral copies this assay can detect is 131 copies/mL. A negative result does not preclude SARS-Cov-2 infection and should not be used as the sole basis for treatment or other patient management decisions. A negative result may occur with  improper specimen collection/handling, submission of specimen other than nasopharyngeal swab, presence of viral mutation(s) within the areas targeted by this assay, and inadequate number of viral copies (<131 copies/mL). A negative result must be combined with clinical observations, patient history, and epidemiological information. The expected result is Negative.  Fact Sheet for Patients:  PinkCheek.be  Fact Sheet for Healthcare Providers:  GravelBags.it  This test is no t yet approved  or cleared by the [place] and  has been authorized for detection and/or diagnosis of SARS-CoV-2 by FDA under an Emergency Use Authorization (EUA). This EUA will remain  in effect (meaning this test can be used) for the duration of the COVID-19 declaration under Section 564(b)(1) of the Act, 21 U.S.C. section 360bbb-3(b)(1), unless the authorization is terminated or revoked sooner.     Influenza A by PCR NEGATIVE NEGATIVE Final   Influenza B by PCR NEGATIVE NEGATIVE Final    Comment: (NOTE) The Xpert Xpress SARS-CoV-2/FLU/RSV assay is intended as an aid in  the diagnosis of influenza from Nasopharyngeal swab specimens and  should not be used as a sole basis for treatment. Nasal washings and  aspirates are unacceptable for Xpert Xpress SARS-CoV-2/FLU/RSV  testing.  Fact Sheet for Patients: PinkCheek.be  Fact Sheet for Healthcare Providers: GravelBags.it  This test is not yet approved or cleared by the [place] FDA and  has been  authorized for detection and/or diagnosis of SARS-CoV-2 by  FDA under an Emergency Use Authorization (EUA). This EUA will remain  in effect (meaning this test can be used) for the duration of the  Covid-19 declaration under Section 564(b)(1) of the Act, 21  U.S.C. section 360bbb-3(b)(1), unless the authorization is  terminated or revoked. Performed at [facility], [address], [place], [place] [address]   Blood Culture (routine x 2)     Status: Abnormal   Collection Time: 02/22/20 12:20 PM   Specimen: BLOOD LEFT HAND  Result Value Ref Range Status   Specimen Description   Final    BLOOD LEFT HAND BOTTLES DRAWN AEROBIC AND ANAEROBIC Performed at [facility], [address] [place], [place] [address]    Special Requests   Final    Blood Culture adequate volume Performed at [facility], [address] [place], [place] [address]    Culture  Setup Time   Final    GRAM NEGATIVE RODS Gram Stain Report Called to,Read Back By and Verified With: DANIELS,J @ 6333 ON 02/23/20 BY JUW AEROBIC BOTTLE ONLY GS DONE @ [facility] Performed at [facility], [address], [place], [place] [address]    Culture (A)  Final    PSEUDOMONAS AERUGINOSA SUSCEPTIBILITIES PERFORMED ON PREVIOUS CULTURE WITHIN THE LAST 5 DAYS. Performed at [facility], [place] [address], [place], [place] [address]    Report Status 02/25/2020 FINAL  Final  Blood Culture (routine x 2)     Status: Abnormal   Collection Time: 02/22/20 12:20 PM   Specimen: Right Antecubital; Blood  Result Value Ref Range Status   Specimen Description   Final    RIGHT ANTECUBITAL BOTTLES DRAWN AEROBIC AND ANAEROBIC Performed at [facility], [address] [place], [place] [address]    Special Requests   Final    Blood Culture adequate volume Performed at [facility], [address], [place], [place] [address]    Culture  Setup Time   Final    GRAM NEGATIVE RODS Gram Stain Report Called to,Read Back By and Verified With: DANIELS,J @ 1572  ON 02/23/20 BY JUW AEROBIC BOTTLE ONLY GS DONE @ [facility]    Culture PSEUDOMONAS AERUGINOSA (A)  Final   Report Status 02/25/2020 FINAL  Final   Organism ID, Bacteria PSEUDOMONAS AERUGINOSA  Final      Susceptibility   Pseudomonas aeruginosa - MIC*    CEFTAZIDIME 4 SENSITIVE Sensitive     CIPROFLOXACIN <=0.25 SENSITIVE Sensitive     GENTAMICIN <=1  SENSITIVE Sensitive     IMIPENEM 2 SENSITIVE Sensitive     PIP/TAZO 8 SENSITIVE Sensitive     CEFEPIME 2 SENSITIVE Sensitive     * PSEUDOMONAS AERUGINOSA  Blood Culture ID Panel (Reflexed)     Status: Abnormal   Collection Time: 02/22/20 12:20 PM  Result Value Ref Range Status   Enterococcus faecalis NOT DETECTED NOT DETECTED Final   Enterococcus Faecium NOT DETECTED NOT DETECTED Final   Listeria monocytogenes NOT DETECTED NOT DETECTED Final   Staphylococcus species NOT DETECTED NOT DETECTED Final   Staphylococcus aureus (BCID) NOT DETECTED NOT DETECTED Final   Staphylococcus epidermidis NOT DETECTED NOT DETECTED Final   Staphylococcus lugdunensis NOT DETECTED NOT DETECTED Final   Streptococcus species NOT DETECTED NOT DETECTED Final   Streptococcus agalactiae NOT DETECTED NOT DETECTED Final   Streptococcus pneumoniae NOT DETECTED NOT DETECTED Final   Streptococcus pyogenes NOT DETECTED NOT DETECTED Final   A.calcoaceticus-baumannii NOT DETECTED NOT DETECTED Final   Bacteroides fragilis NOT DETECTED NOT DETECTED Final   Enterobacterales NOT DETECTED NOT DETECTED Final   Enterobacter cloacae complex NOT DETECTED NOT DETECTED Final   Escherichia coli NOT DETECTED NOT DETECTED Final   Klebsiella aerogenes NOT DETECTED NOT DETECTED Final   Klebsiella oxytoca NOT DETECTED NOT DETECTED Final   Klebsiella pneumoniae NOT DETECTED NOT DETECTED Final   Proteus species NOT DETECTED NOT DETECTED Final   Salmonella species NOT DETECTED NOT DETECTED Final   Serratia marcescens NOT DETECTED NOT DETECTED Final   Haemophilus influenzae NOT DETECTED NOT  DETECTED Final   Neisseria meningitidis NOT DETECTED NOT DETECTED Final   Pseudomonas aeruginosa DETECTED (A) NOT DETECTED Final    Comment: CRITICAL RESULT CALLED TO, READ BACK BY AND VERIFIED WITH: Hyman Hopes RN, AT 1245 02/23/20 BY D. VANHOOK    Stenotrophomonas maltophilia NOT DETECTED NOT DETECTED Final   Candida albicans NOT DETECTED NOT DETECTED Final   Candida auris NOT DETECTED NOT DETECTED Final   Candida glabrata NOT DETECTED NOT DETECTED Final   Candida krusei NOT DETECTED NOT DETECTED Final   Candida parapsilosis NOT DETECTED NOT DETECTED Final   Candida tropicalis NOT DETECTED NOT DETECTED Final   Cryptococcus neoformans/gattii NOT DETECTED NOT DETECTED Final   CTX-M ESBL NOT DETECTED NOT DETECTED Final   Carbapenem resistance IMP NOT DETECTED NOT DETECTED Final   Carbapenem resistance KPC NOT DETECTED NOT DETECTED Final   Carbapenem resistance NDM NOT DETECTED NOT DETECTED Final   Carbapenem resistance VIM NOT DETECTED NOT DETECTED Final    Comment: Performed at [facility], [address] [place], [place] [address]  MRSA PCR Screening     Status: None   Collection Time: 02/22/20 12:28 PM   Specimen: Nasopharyngeal  Result Value Ref Range Status   MRSA by PCR NEGATIVE NEGATIVE Final    Comment:        The GeneXpert MRSA Assay (FDA approved for NASAL specimens only), is one component of a comprehensive MRSA colonization surveillance program. It is not intended to diagnose MRSA infection nor to guide or monitor treatment for MRSA infections. Performed at [facility], [address], [place], [place] [address]   Culture, blood (routine x 2)     Status: None (Preliminary result)   Collection Time: 02/27/20  9:53 AM   Specimen: BLOOD RIGHT WRIST  Result Value Ref Range Status   Specimen Description BLOOD RIGHT WRIST  Final   Special Requests   Final    BOTTLES DRAWN AEROBIC AND ANAEROBIC Blood  Culture adequate volume   Culture   Final    NO GROWTH < 24  HOURS Performed at [facility], [address] [place], [place] [address]    Report Status PENDING  Incomplete  Culture, blood (routine x 2)     Status: None (Preliminary result)   Collection Time: 02/27/20  9:54 AM   Specimen: BLOOD RIGHT FOREARM  Result Value Ref Range Status   Specimen Description BLOOD RIGHT FOREARM  Final   Special Requests   Final    BOTTLES DRAWN AEROBIC AND ANAEROBIC Blood Culture adequate volume   Culture   Final    NO GROWTH < 24 HOURS Performed at [facility], [address], [place], [place] [address]    Report Status PENDING  Incomplete    Radiology Reports DG Thoracic Spine W/Swimmers  Result Date: 02/14/2020 CLINICAL DATA:  Bilateral leg swelling.  No reported back symptoms EXAM: THORACIC SPINE - 3 VIEWS COMPARISON:  None. FINDINGS: Diffuse bridging osteophytes from spondylosis. Generalized disc narrowing. No evidence of fracture, endplate erosion, or focal bone lesion. Maintained posterior mediastinal fat planes when allowing for rotation. IMPRESSION: 1. Generalized spondylosis with multi-level bridging. 2. No acute finding. Electronically Signed   By: Monte Fantasia M.D.   On: 02/14/2020 08:12   DG Lumbar Spine Complete  Result Date: 02/14/2020 CLINICAL DATA:  Leg swelling.  No known injury.  Dementia. EXAM: LUMBAR SPINE - COMPLETE 4+ VIEW COMPARISON:  None. FINDINGS: Diffuse disc narrowing and bulky endplate spurring. Diffuse facet degenerative spurring. Multilevel ankylosis is likely present. No evidence of fracture, erosion or bone lesion. IMPRESSION: 1. No acute finding. 2. Advanced lumbar spine degeneration with multiple bridging osteophytes. Electronically Signed   By: Monte Fantasia M.D.   On: 02/14/2020 08:11   DG Pelvis 1-2 Views  Result Date: 02/14/2020 CLINICAL DATA:  Bilateral leg swelling with no known injury or back pain EXAM: PELVIS - 1-2 VIEW COMPARISON:  August 25, 2017 FINDINGS: Bilateral hip degenerative changes. No signs of acute  fracture or bone abnormality. Degenerative changes also noted incidentally in the lower lumbar spine. IMPRESSION: Bilateral hip degenerative changes. No signs of acute fracture. Electronically Signed   By: Zetta Bills M.D.   On: 02/14/2020 08:07   DG Ankle Complete Right  Result Date: 02/14/2020 CLINICAL DATA:  Bilateral foot pain and swelling. EXAM: RIGHT ANKLE - COMPLETE 3+ VIEW COMPARISON:  None. FINDINGS: Nonspecific soft tissue swelling. Profound osteopenia. No acute fracture or subluxation. Bilateral malleolar spurring from enthesophytes. IMPRESSION: 1. Soft tissue swelling without acute osseous finding. 2. Marked osteopenia. Electronically Signed   By: Monte Fantasia M.D.   On: 02/14/2020 07:17   DG CHEST PORT 1 VIEW  Result Date: 02/26/2020 CLINICAL DATA:  Possible aspiration EXAM: PORTABLE CHEST 1 VIEW COMPARISON:  02/22/2020 FINDINGS: Cardiac shadow is stable. Aortic calcifications are again seen. The lungs are well aerated bilaterally. No focal infiltrate or sizable effusion is seen. No bony abnormality is noted. IMPRESSION: No acute abnormality seen.  No change from the prior exam. Electronically Signed   By: Inez Catalina M.D.   On: 02/26/2020 17:41   DG Chest Port 1 View  Result Date: 02/22/2020 CLINICAL DATA:  Sepsis EXAM: PORTABLE CHEST 1 VIEW COMPARISON:  February 14, 2020 FINDINGS: The heart size and mediastinal contours are within normal limits. Both lungs are clear. The visualized skeletal structures are unremarkable. IMPRESSION: No active disease. Electronically Signed   By: Abelardo Diesel M.D.   On: 02/22/2020 14:08   DG  Chest Portable 1 View  Result Date: 02/14/2020 CLINICAL DATA:  Bilateral foot pain EXAM: PORTABLE CHEST 1 VIEW COMPARISON:  08/25/2017 FINDINGS: Normal heart size and stable aortic tortuosity. There is no edema, consolidation, effusion, or pneumothorax. Artifact from EKG leads. Degenerative endplate spurring. IMPRESSION: No evidence of acute disease.  Electronically Signed   By: Monte Fantasia M.D.   On: 02/14/2020 07:06   DG Swallowing Func-Speech Pathology  Result Date: 02/26/2020 Objective Swallowing Evaluation: Type of Study: MBS-Modified Barium Swallow Study  Patient Details Name: Paul Perez MRN: [id number] Date of Birth: Nov 14, 1927 Today's Date: 02/26/2020 Time: SLP Start Time (ACUTE ONLY): 1339 -SLP Stop Time (ACUTE ONLY): 1408 SLP Time Calculation (min) (ACUTE ONLY): 29 min Past Medical History: Past Medical History: Diagnosis Date . Dementia ([place])  . Diabetes mellitus without complication ([place])  . Encephalopathy  . Gastritis 2004 . GIB (gastrointestinal bleeding) 2004 . Hypertension  . Poor historian  . Sinus bradycardia seen on cardiac monitor  Past Surgical History: Past Surgical History: Procedure Laterality Date . INGUINAL HERNIA REPAIR  09/2010  Bilateral . UPPER GASTROINTESTINAL ENDOSCOPY  2004 HPI: This 86 y.o.malewith medical history significant ofDementia,DiabetesMellitus II, Hypertension, CKD stage IV, premature atrial contractions,history of GI bleed, presents in the emergency department with confusion and fever. This is patient's third ER visit In last three weeks.Last week he presented in the ED with generalized weakness and pain,found to have a UTI . He was discharged on Keflex. Daughter reportshe has been having worsening pain and weakness, was not able to stand up in the morning. Patient is admitted for sepsis secondary to Pseudomonas bacteremia.  He was also found to have high anion gap metabolic acidosis secondary to DKA which has been resolved. He is transitioned to subcu insulin. BSE requested.  Subjective: "I want water!" Assessment / Plan / Recommendation CHL IP CLINICAL IMPRESSIONS 02/26/2020 Clinical Impression Pt presents with mild oropharyngeal sensorimotor dysphagia characterized by decreased laryngeal vestibule closure resulting in two episodes of trace silent aspiration of thin liquids via straw and  consistent penetration of thin liquids and NTL during the swallow that is usually expelled with repeat swallow of solid textures. Decreased pharyngeal constriction resulting in inconsistent clearance of penetrates however despite remaining in the airway, they were only visualized dropping to and below the cords twice as mentioned above. Solid textures were consumed without incident and the barium tablet was swallowed with thin liquids with noted brief stasis in the pyriforms but quickly passed through. RN was present for MBS and BP and HR were stable throughout procedure. Recommend D3/mech soft and Nectar thick liquids; recommend meds whole or crushed in puree as Pt is able. Also recommend free water protocol, * single ice chips after oral care in between meals *. ST will continue to follow acutely. SLP Visit Diagnosis Dysphagia, unspecified (R13.10) Attention and concentration deficit following -- Frontal lobe and executive function deficit following -- Impact on safety and function Mild aspiration risk;Moderate aspiration risk   CHL IP TREATMENT RECOMMENDATION 02/26/2020 Treatment Recommendations Therapy as outlined in treatment plan below   Prognosis 02/26/2020 Prognosis for Safe Diet Advancement Fair Barriers to Reach Goals Cognitive deficits Barriers/Prognosis Comment -- [facility] IP DIET RECOMMENDATION 02/26/2020 SLP Diet Recommendations Dysphagia 3 (Mech soft) solids;Nectar thick liquid Liquid Administration via Cup;No straw Medication Administration Whole meds with puree Compensations Slow rate;Small sips/bites Postural Changes Remain semi-upright after after feeds/meals (Comment);Seated upright at 90 degrees   CHL IP OTHER RECOMMENDATIONS 02/26/2020 Recommended Consults -- Oral Care Recommendations Oral care BID  Other Recommendations Order thickener from pharmacy   CHL IP FOLLOW UP RECOMMENDATIONS 02/26/2020 Follow up Recommendations 24 hour supervision/assistance   CHL IP FREQUENCY AND DURATION 02/26/2020 Speech  Therapy Frequency (ACUTE ONLY) min 2x/week Treatment Duration 1 week      CHL IP ORAL PHASE 02/26/2020 Oral Phase WFL Oral - Pudding Teaspoon -- Oral - Pudding Cup -- Oral - Honey Teaspoon -- Oral - Honey Cup -- Oral - Nectar Teaspoon -- Oral - Nectar Cup -- Oral - Nectar Straw -- Oral - Thin Teaspoon -- Oral - Thin Cup -- Oral - Thin Straw -- Oral - Puree -- Oral - Mech Soft -- Oral - Regular -- Oral - Multi-Consistency -- Oral - Pill -- Oral Phase - Comment --  CHL IP PHARYNGEAL PHASE 02/26/2020 Pharyngeal Phase Impaired Pharyngeal- Pudding Teaspoon -- Pharyngeal -- Pharyngeal- Pudding Cup -- Pharyngeal -- Pharyngeal- Honey Teaspoon Pharyngeal residue - valleculae;Pharyngeal residue - pyriform;Reduced pharyngeal peristalsis Pharyngeal -- Pharyngeal- Honey Cup -- Pharyngeal -- Pharyngeal- Nectar Teaspoon Reduced pharyngeal peristalsis;Reduced airway/laryngeal closure;Penetration/Aspiration during swallow;Pharyngeal residue - valleculae;Pharyngeal residue - pyriform Pharyngeal Material enters airway, remains ABOVE vocal cords then ejected out Pharyngeal- Nectar Cup Reduced pharyngeal peristalsis;Reduced airway/laryngeal closure;Penetration/Aspiration during swallow;Pharyngeal residue - valleculae;Pharyngeal residue - pyriform Pharyngeal Material enters airway, remains ABOVE vocal cords then ejected out Pharyngeal- Nectar Straw -- Pharyngeal -- Pharyngeal- Thin Teaspoon Delayed swallow initiation-pyriform sinuses;Reduced pharyngeal peristalsis;Reduced epiglottic inversion;Reduced anterior laryngeal mobility;Reduced laryngeal elevation;Reduced airway/laryngeal closure;Reduced tongue base retraction;Penetration/Aspiration during swallow;Pharyngeal residue - valleculae;Pharyngeal residue - pyriform Pharyngeal Material enters airway, remains ABOVE vocal cords and not ejected out Pharyngeal- Thin Cup Delayed swallow initiation-pyriform sinuses;Reduced pharyngeal peristalsis;Reduced epiglottic inversion;Reduced anterior  laryngeal mobility;Reduced laryngeal elevation;Reduced airway/laryngeal closure;Reduced tongue base retraction;Penetration/Aspiration during swallow;Pharyngeal residue - valleculae;Pharyngeal residue - pyriform Pharyngeal Material enters airway, remains ABOVE vocal cords and not ejected out Pharyngeal- Thin Straw Delayed swallow initiation-pyriform sinuses;Reduced pharyngeal peristalsis;Reduced epiglottic inversion;Reduced anterior laryngeal mobility;Reduced laryngeal elevation;Reduced airway/laryngeal closure;Reduced tongue base retraction;Penetration/Aspiration during swallow;Pharyngeal residue - valleculae;Pharyngeal residue - pyriform Pharyngeal Material enters airway, passes BELOW cords without attempt by patient to eject out (silent aspiration) Pharyngeal- Puree WFL Pharyngeal -- Pharyngeal- Mechanical Soft -- Pharyngeal -- Pharyngeal- Regular WFL Pharyngeal -- Pharyngeal- Multi-consistency -- Pharyngeal -- Pharyngeal- Pill Pharyngeal residue - cp segment;Pharyngeal residue - pyriform Pharyngeal -- Pharyngeal Comment --  CHL IP CERVICAL ESOPHAGEAL PHASE 02/26/2020 Cervical Esophageal Phase WFL Pudding Teaspoon -- Pudding Cup -- Honey Teaspoon -- Honey Cup -- Nectar Teaspoon -- Nectar Cup -- Nectar Straw -- Thin Teaspoon -- Thin Cup -- Thin Straw -- Puree -- Mechanical Soft -- Regular -- Multi-consistency -- Pill -- Cervical Esophageal Comment -- Amelia H. Roddie Mc, CCC-SLP Speech Language Pathologist Wende Bushy 02/26/2020, 4:49 PM              US Abdomen Limited RUQ  Result Date: 02/23/2020 CLINICAL DATA:  Elevated liver enzymes EXAM: ULTRASOUND ABDOMEN LIMITED RIGHT UPPER QUADRANT COMPARISON:  August 25, 2017 FINDINGS: Gallbladder: No wall thickening visualized. Gallstones are identified. 0.3 cm polyp is identified. No sonographic Murphy sign noted by sonographer. Common bile duct: Diameter: 0.3 mm Liver: 1.5 x 1.1 x 1.4 cm cyst is identified in the left lobe liver. Within normal limits in  parenchymal echogenicity. Portal vein is patent on color Doppler imaging with normal direction of blood flow towards the liver. Other: None. IMPRESSION: 1. Cholelithiasis without sonographic evidence of acute cholecystitis. 2. 1.5 cm cyst in the left lobe liver. Electronically Signed   By: Mallie Darting.D.  On: 02/23/2020 14:49     Time Spent in minutes  30     Desiree Hane M.D on 02/28/2020 at 12:55 PM  To page go to [email or web address] - password [facility]

## 2020-02-28 NOTE — Progress Notes (Signed)
Inpatient Diabetes Program Recommendations  AACE/ADA: New Consensus Statement on Inpatient Glycemic Control   Target Ranges:  Prepandial:   less than 140 mg/dL      Peak postprandial:   less than 180 mg/dL (1-2 hours)      Critically ill patients:  140 - 180 mg/dL   Results for Paul Perez, Paul Perez "ED" (MRN [id number]) as of 02/28/2020 08:11  Ref. Range 02/27/2020 07:17 02/27/2020 10:56 02/27/2020 16:30 02/27/2020 21:23 02/28/2020 07:58  Glucose-Capillary Latest Ref Range: 70 - 99 mg/dL 252 (H) 375 (H) 310 (H) 121 (H) 276 (H)   Review of Glycemic Control  Diabetes history: DM2 Outpatient Diabetes medications: Actos 15 mg daily Current orders for Inpatient glycemic control: Levemir 18 units daily, Novolog 0-9 units TID, Novolog 3 units TID with meals  Inpatient Diabetes Program Recommendations:    Insulin: Please consider increasing Levemir to 20 units daily and meal coverage to Novolog 5 units TID with meals.  Thanks, Barnie Alderman, RN, MSN, CDE Diabetes Coordinator Inpatient Diabetes Program [phone] (Team Pager from 8am to 5pm)

## 2020-02-29 DIAGNOSIS — R6520 Severe sepsis without septic shock: Secondary | ICD-10-CM | POA: Diagnosis not present

## 2020-02-29 DIAGNOSIS — E1110 Type 2 diabetes mellitus with ketoacidosis without coma: Secondary | ICD-10-CM

## 2020-02-29 DIAGNOSIS — R1312 Dysphagia, oropharyngeal phase: Secondary | ICD-10-CM

## 2020-02-29 DIAGNOSIS — A419 Sepsis, unspecified organism: Secondary | ICD-10-CM | POA: Diagnosis not present

## 2020-02-29 LAB — CBC
HCT: 28.2 % — ABNORMAL LOW (ref 39.0–52.0)
Hemoglobin: 8.4 g/dL — ABNORMAL LOW (ref 13.0–17.0)
MCH: 26.5 pg (ref 26.0–34.0)
MCHC: 29.8 g/dL — ABNORMAL LOW (ref 30.0–36.0)
MCV: 89 fL (ref 80.0–100.0)
Platelets: 267 10*3/uL (ref 150–400)
RBC: 3.17 MIL/uL — ABNORMAL LOW (ref 4.22–5.81)
RDW: 14.7 % (ref 11.5–15.5)
WBC: 11.4 10*3/uL — ABNORMAL HIGH (ref 4.0–10.5)
nRBC: 0 % (ref 0.0–0.2)

## 2020-02-29 LAB — BASIC METABOLIC PANEL
Anion gap: 12 (ref 5–15)
BUN: 75 mg/dL — ABNORMAL HIGH (ref 8–23)
CO2: 25 mmol/L (ref 22–32)
Calcium: 9.1 mg/dL (ref 8.9–10.3)
Chloride: 115 mmol/L — ABNORMAL HIGH (ref 98–111)
Creatinine, Ser: 2.84 mg/dL — ABNORMAL HIGH (ref 0.61–1.24)
GFR, Estimated: 19 mL/min — ABNORMAL LOW (ref >60)
Glucose, Bld: 138 mg/dL — ABNORMAL HIGH (ref 70–99)
Potassium: 3.5 mmol/L (ref 3.5–5.1)
Sodium: 152 mmol/L — ABNORMAL HIGH (ref 135–145)

## 2020-02-29 LAB — GLUCOSE, CAPILLARY
Glucose-Capillary: 127 mg/dL — ABNORMAL HIGH (ref 70–99)
Glucose-Capillary: 250 mg/dL — ABNORMAL HIGH (ref 70–99)
Glucose-Capillary: 142 mg/dL — ABNORMAL HIGH (ref 70–99)
Glucose-Capillary: 125 mg/dL — ABNORMAL HIGH (ref 70–99)

## 2020-02-29 MED ORDER — DEXTROSE 5 % IV SOLN: INTRAVENOUS | Status: AC

## 2020-02-29 MED ORDER — ENOXAPARIN SODIUM 30 MG/0.3ML ~~LOC~~ SOLN
30.0000 mg | Freq: Every day | SUBCUTANEOUS | Status: DC
  Administered 2020-03-01 – 2020-03-06 (×6): 30 mg via SUBCUTANEOUS
  Filled 2020-02-29 (×6): qty 0.3

## 2020-02-29 NOTE — Assessment & Plan Note (Signed)
-  See bacteremia

## 2020-02-29 NOTE — Assessment & Plan Note (Signed)
-  Multifactorial in setting of underlying infection, dementia, and hospital-acquired delirium -Continue reorienting as needed.  Overall he is able to answer some questions and interact appropriately, as well as follow commands

## 2020-02-29 NOTE — Progress Notes (Signed)
PROGRESS NOTE    Paul Perez   JEH:[id number]  DOB: [birth year]  DOA: 02/22/2020     7  PCP: Alanson Puls, [facility]  CC: fever, AMS  Hospital Course: Paul Perez is a 86 y.o. year old male with medical history significant for dementia, HTN, CKDStage IV, PACs who presented on 2/9 with confusion and fever after recently being treated for UTI with Keflex and was found to have severe sepsis secondary to Pseudomonas bacteremia.   Hospital course complicated by DKA, paroxysmal atrial fibrillation, oropharyngeal dysphagia.  He was started on cefepime and he will complete a total of 7-day course which will conclude on 03/01/2020.  Repeat blood cultures have remained negative.  Overall he clinically improved from an infection standpoint.  He does have significant underlying dementia and remains at increased risk for falls and delirium in general.  He was evaluated by physical therapy during hospitalization with recommendations for SNF at discharge.  He was also evaluated by speech therapy and started on dysphagia 3 diet.  He will also need ongoing aspiration precautions.    Interval History:  No events overnight.  Resting in bed comfortably this morning.  He is able to follow some commands when asked but has significant underlying dementia when interacting with him.  Old records reviewed in assessment of this patient  ROS: Review of systems not obtained due to patient factors.  Severe dementia  Assessment & Plan: * Severe sepsis with acute organ dysfunction (HCC)-resolved as of 02/29/2020 -See bacteremia  Bacteremia due to Pseudomonas -Repeat blood cultures have remained negative -IV cefepime to be completed on 03/01/2020 -Likely stable for discharging to SNF on Sunday or Monday when bed available  Acute metabolic encephalopathy-resolved as of 02/29/2020 -Multifactorial in setting of underlying infection, dementia, and hospital-acquired delirium -Continue reorienting as  needed.  Overall he is able to answer some questions and interact appropriately, as well as follow commands  Dementia ([place]) -Continue Aricept  Hypernatremia -Likely due to poor oral intake with free water deficit -Previous trial of D5W infusion on Friday.  Will repeat today -BMP in a.m.  Hypertension Continue current dose of hydralazine 75 mg 3 times daily, tolerating Lopressor 25 mg twice daily -Continue to monitor amlodipine may be necessary if remains elevated -Clonidine patch, Lasix  Oropharyngeal dysphagia -Evaluated by SLP, appreciate assistance -Continue dysphagia 3 diet -Continue aspiration precautions  AF (paroxysmal atrial fibrillation) (HCC) Occurred in setting of potential aspiration event. -Appreciate cardiology recommendations, continue oral Lopressor -Not a candidate for anticoagulation given dementia, and prior history of GI bleed   CKD (chronic kidney disease) stage 4, GFR 15-29 ml/min (HCC) -Baseline creatinine around 3 and remains at baseline -Avoid nephrotoxic agents as able  Diabetes mellitus type 2, uncontrolled, with complications (HCC) -Continue Levemir 20 units daily -Continue SSI and CBG monitoring -Continue NovoLog prandial coverage  DKA, type 2 (HCC)-resolved as of 02/29/2020 -Likely in setting of uncontrolled diabetes with severely elevated A1c and superimposed infection on admission -DKA has since resolved    Antimicrobials: Cefepime, completion date 03/01/2020  DVT prophylaxis: Lovenox Code Status: Full Family Communication: None present Disposition Plan: Status is: Inpatient  Remains inpatient appropriate because:Unsafe d/c plan, IV treatments appropriate due to intensity of illness or inability to take PO and Inpatient level of care appropriate due to severity of illness   Dispo: The patient is from: Home              Anticipated d/c is to: SNF  Anticipated d/c date is: 1 day              Patient currently is not  medically stable to d/c.       Objective: Blood pressure (!) 152/71, pulse 90, temperature 99 F (37.2 C), temperature source Oral, resp. rate 18, height 6' (1.829 m), weight 83.1 kg, SpO2 98 %.  Examination: General appearance: Elderly demented seen in bed in no distress able to follow some commands Head: Normocephalic, without obvious abnormality, atraumatic Eyes: EOMI Lungs: clear to auscultation bilaterally Heart: regular rate and rhythm and S1, S2 normal Abdomen: normal findings: bowel sounds normal and soft, non-tender Extremities: No edema Skin: mobility and turgor normal Neurologic: Moves all 4 extremities, follows some commands  Consultants:   Cardiology  Procedures:   Barium swallow, 02/26/2020  Data Reviewed: I have personally reviewed following labs and imaging studies Results for orders placed or performed during the hospital encounter of 02/22/20 (from the past 24 hour(s))  Basic metabolic panel     Status: Abnormal   Collection Time: 02/28/20  3:00 PM  Result Value Ref Range   Sodium 128 (L) 135 - 145 mmol/L   Potassium 3.6 3.5 - 5.1 mmol/L   Chloride 96 (L) 98 - 111 mmol/L   CO2 25 22 - 32 mmol/L   Glucose, Bld 117 (H) 70 - 99 mg/dL   BUN 28 (H) 8 - 23 mg/dL   Creatinine, Ser 1.04 0.61 - 1.24 mg/dL   Calcium 8.1 (L) 8.9 - 10.3 mg/dL   GFR, Estimated >60 >60 mL/min   Anion gap 7 5 - 15  Basic metabolic panel     Status: Abnormal   Collection Time: 02/28/20  4:06 PM  Result Value Ref Range   Sodium 149 (H) 135 - 145 mmol/L   Potassium 3.4 (L) 3.5 - 5.1 mmol/L   Chloride 113 (H) 98 - 111 mmol/L   CO2 25 22 - 32 mmol/L   Glucose, Bld 177 (H) 70 - 99 mg/dL   BUN 76 (H) 8 - 23 mg/dL   Creatinine, Ser 2.81 (H) 0.61 - 1.24 mg/dL   Calcium 8.9 8.9 - 10.3 mg/dL   GFR, Estimated 19 (L) >60 mL/min   Anion gap 11 5 - 15  Glucose, capillary     Status: Abnormal   Collection Time: 02/28/20  4:26 PM  Result Value Ref Range   Glucose-Capillary 138 (H) 70 -  99 mg/dL  Glucose, capillary     Status: Abnormal   Collection Time: 02/28/20  9:00 PM  Result Value Ref Range   Glucose-Capillary 136 (H) 70 - 99 mg/dL  Basic metabolic panel     Status: Abnormal   Collection Time: 02/29/20  7:08 AM  Result Value Ref Range   Sodium 152 (H) 135 - 145 mmol/L   Potassium 3.5 3.5 - 5.1 mmol/L   Chloride 115 (H) 98 - 111 mmol/L   CO2 25 22 - 32 mmol/L   Glucose, Bld 138 (H) 70 - 99 mg/dL   BUN 75 (H) 8 - 23 mg/dL   Creatinine, Ser 2.84 (H) 0.61 - 1.24 mg/dL   Calcium 9.1 8.9 - 10.3 mg/dL   GFR, Estimated 19 (L) >60 mL/min   Anion gap 12 5 - 15  CBC     Status: Abnormal   Collection Time: 02/29/20  7:08 AM  Result Value Ref Range   WBC 11.4 (H) 4.0 - 10.5 K/uL   RBC 3.17 (L) 4.22 - 5.81 MIL/uL  Hemoglobin 8.4 (L) 13.0 - 17.0 g/dL   HCT 28.2 (L) 39 - 52 %   MCV 89.0 80.0 - 100.0 fL   MCH 26.5 26.0 - 34.0 pg   MCHC 29.8 (L) 30.0 - 36.0 g/dL   RDW 14.7 11.5 - 15.5 %   Platelets 267 150 - 400 K/uL   nRBC 0.0 0.0 - 0.2 %  Glucose, capillary     Status: Abnormal   Collection Time: 02/29/20  7:34 AM  Result Value Ref Range   Glucose-Capillary 127 (H) 70 - 99 mg/dL  Glucose, capillary     Status: Abnormal   Collection Time: 02/29/20 11:15 AM  Result Value Ref Range   Glucose-Capillary 250 (H) 70 - 99 mg/dL    Recent Results (from the past 240 hour(s))  Urine culture     Status: Abnormal   Collection Time: 02/22/20 12:05 PM   Specimen: In/Out Cath Urine  Result Value Ref Range Status   Specimen Description   Final    IN/OUT CATH URINE Performed at [facility], [address], [place], [place] [address]    Special Requests   Final    NONE Performed at [facility], [address], [place], [place] [address]    Culture 20,000 COLONIES/mL PSEUDOMONAS AERUGINOSA (A)  Final   Report Status 02/25/2020 FINAL  Final   Organism ID, Bacteria PSEUDOMONAS AERUGINOSA (A)  Final      Susceptibility   Pseudomonas aeruginosa - MIC*    CEFTAZIDIME 4  SENSITIVE Sensitive     CIPROFLOXACIN <=0.25 SENSITIVE Sensitive     GENTAMICIN <=1 SENSITIVE Sensitive     IMIPENEM 2 SENSITIVE Sensitive     PIP/TAZO 8 SENSITIVE Sensitive     CEFEPIME 2 SENSITIVE Sensitive     * 20,000 COLONIES/mL PSEUDOMONAS AERUGINOSA  Respiratory Panel by RT PCR (Flu A&B, Covid) - Nasopharyngeal Swab     Status: None   Collection Time: 02/22/20 12:10 PM   Specimen: Nasopharyngeal Swab  Result Value Ref Range Status   SARS Coronavirus 2 by RT PCR NEGATIVE NEGATIVE Final    Comment: (NOTE) SARS-CoV-2 target nucleic acids are NOT DETECTED.  The SARS-CoV-2 RNA is generally detectable in upper respiratoy specimens during the acute phase of infection. The lowest concentration of SARS-CoV-2 viral copies this assay can detect is 131 copies/mL. A negative result does not preclude SARS-Cov-2 infection and should not be used as the sole basis for treatment or other patient management decisions. A negative result may occur with  improper specimen collection/handling, submission of specimen other than nasopharyngeal swab, presence of viral mutation(s) within the areas targeted by this assay, and inadequate number of viral copies (<131 copies/mL). A negative result must be combined with clinical observations, patient history, and epidemiological information. The expected result is Negative.  Fact Sheet for Patients:  PinkCheek.be  Fact Sheet for Healthcare Providers:  GravelBags.it  This test is no t yet approved or cleared by the [place] FDA and  has been authorized for detection and/or diagnosis of SARS-CoV-2 by FDA under an Emergency Use Authorization (EUA). This EUA will remain  in effect (meaning this test can be used) for the duration of the COVID-19 declaration under Section 564(b)(1) of the Act, 21 U.S.C. section 360bbb-3(b)(1), unless the authorization is terminated or revoked sooner.      Influenza A by PCR NEGATIVE NEGATIVE Final   Influenza B by PCR NEGATIVE NEGATIVE Final    Comment: (NOTE) The Xpert Xpress SARS-CoV-2/FLU/RSV assay is intended as an  aid in  the diagnosis of influenza from Nasopharyngeal swab specimens and  should not be used as a sole basis for treatment. Nasal washings and  aspirates are unacceptable for Xpert Xpress SARS-CoV-2/FLU/RSV  testing.  Fact Sheet for Patients: PinkCheek.be  Fact Sheet for Healthcare Providers: GravelBags.it  This test is not yet approved or cleared by the [place] FDA and  has been authorized for detection and/or diagnosis of SARS-CoV-2 by  FDA under an Emergency Use Authorization (EUA). This EUA will remain  in effect (meaning this test can be used) for the duration of the  Covid-19 declaration under Section 564(b)(1) of the Act, 21  U.S.C. section 360bbb-3(b)(1), unless the authorization is  terminated or revoked. Performed at [facility], [address], [place], [place] [address]   Blood Culture (routine x 2)     Status: Abnormal   Collection Time: 02/22/20 12:20 PM   Specimen: BLOOD LEFT HAND  Result Value Ref Range Status   Specimen Description   Final    BLOOD LEFT HAND BOTTLES DRAWN AEROBIC AND ANAEROBIC Performed at [facility], [address], [place], [place] [address]    Special Requests   Final    Blood Culture adequate volume Performed at [facility], [address], [place], [place] [address]    Culture  Setup Time   Final    GRAM NEGATIVE RODS Gram Stain Report Called to,Read Back By and Verified With: DANIELS,J @ 3846 ON 02/23/20 BY JUW AEROBIC BOTTLE ONLY GS DONE @ [facility] Performed at [facility], [address], [place], [place] [address]    Culture (A)  Final    PSEUDOMONAS AERUGINOSA SUSCEPTIBILITIES PERFORMED ON PREVIOUS CULTURE WITHIN THE LAST 5 DAYS. Performed at [facility], [place] [address] [place], [place]  [address]    Report Status 02/25/2020 FINAL  Final  Blood Culture (routine x 2)     Status: Abnormal   Collection Time: 02/22/20 12:20 PM   Specimen: Right Antecubital; Blood  Result Value Ref Range Status   Specimen Description   Final    RIGHT ANTECUBITAL BOTTLES DRAWN AEROBIC AND ANAEROBIC Performed at [facility], [address], [place], [place] [address]    Special Requests   Final    Blood Culture adequate volume Performed at [address] [place], [place] [address]    Culture  Setup Time   Final    GRAM NEGATIVE RODS Gram Stain Report Called to,Read Back By and Verified With: DANIELS,J @ 3300 ON 02/23/20 BY JUW AEROBIC BOTTLE ONLY GS DONE @ [facility]    Culture PSEUDOMONAS AERUGINOSA (A)  Final   Report Status 02/25/2020 FINAL  Final   Organism ID, Bacteria PSEUDOMONAS AERUGINOSA  Final      Susceptibility   Pseudomonas aeruginosa - MIC*    CEFTAZIDIME 4 SENSITIVE Sensitive     CIPROFLOXACIN <=0.25 SENSITIVE Sensitive     GENTAMICIN <=1 SENSITIVE Sensitive     IMIPENEM 2 SENSITIVE Sensitive     PIP/TAZO 8 SENSITIVE Sensitive     CEFEPIME 2 SENSITIVE Sensitive     * PSEUDOMONAS AERUGINOSA  Blood Culture ID Panel (Reflexed)     Status: Abnormal   Collection Time: 02/22/20 12:20 PM  Result Value Ref Range Status   Enterococcus faecalis NOT DETECTED NOT DETECTED Final   Enterococcus Faecium NOT DETECTED NOT DETECTED Final   Listeria monocytogenes NOT DETECTED NOT DETECTED Final   Staphylococcus species NOT DETECTED NOT DETECTED Final   Staphylococcus aureus (BCID)  NOT DETECTED NOT DETECTED Final   Staphylococcus epidermidis NOT DETECTED NOT DETECTED Final   Staphylococcus lugdunensis NOT DETECTED NOT DETECTED Final   Streptococcus species NOT DETECTED NOT DETECTED Final   Streptococcus agalactiae NOT DETECTED NOT DETECTED Final   Streptococcus pneumoniae NOT DETECTED NOT DETECTED Final   Streptococcus pyogenes NOT DETECTED NOT DETECTED Final    A.calcoaceticus-baumannii NOT DETECTED NOT DETECTED Final   Bacteroides fragilis NOT DETECTED NOT DETECTED Final   Enterobacterales NOT DETECTED NOT DETECTED Final   Enterobacter cloacae complex NOT DETECTED NOT DETECTED Final   Escherichia coli NOT DETECTED NOT DETECTED Final   Klebsiella aerogenes NOT DETECTED NOT DETECTED Final   Klebsiella oxytoca NOT DETECTED NOT DETECTED Final   Klebsiella pneumoniae NOT DETECTED NOT DETECTED Final   Proteus species NOT DETECTED NOT DETECTED Final   Salmonella species NOT DETECTED NOT DETECTED Final   Serratia marcescens NOT DETECTED NOT DETECTED Final   Haemophilus influenzae NOT DETECTED NOT DETECTED Final   Neisseria meningitidis NOT DETECTED NOT DETECTED Final   Pseudomonas aeruginosa DETECTED (A) NOT DETECTED Final    Comment: CRITICAL RESULT CALLED TO, READ BACK BY AND VERIFIED WITH: Hyman Hopes RN, AT 1245 02/23/20 BY D. VANHOOK    Stenotrophomonas maltophilia NOT DETECTED NOT DETECTED Final   Candida albicans NOT DETECTED NOT DETECTED Final   Candida auris NOT DETECTED NOT DETECTED Final   Candida glabrata NOT DETECTED NOT DETECTED Final   Candida krusei NOT DETECTED NOT DETECTED Final   Candida parapsilosis NOT DETECTED NOT DETECTED Final   Candida tropicalis NOT DETECTED NOT DETECTED Final   Cryptococcus neoformans/gattii NOT DETECTED NOT DETECTED Final   CTX-M ESBL NOT DETECTED NOT DETECTED Final   Carbapenem resistance IMP NOT DETECTED NOT DETECTED Final   Carbapenem resistance KPC NOT DETECTED NOT DETECTED Final   Carbapenem resistance NDM NOT DETECTED NOT DETECTED Final   Carbapenem resistance VIM NOT DETECTED NOT DETECTED Final    Comment: Performed at [facility], [address] [place], [place] [address]  MRSA PCR Screening     Status: None   Collection Time: 02/22/20 12:28 PM   Specimen: Nasopharyngeal  Result Value Ref Range Status   MRSA by PCR NEGATIVE NEGATIVE Final    Comment:        The GeneXpert MRSA Assay  (FDA approved for NASAL specimens only), is one component of a comprehensive MRSA colonization surveillance program. It is not intended to diagnose MRSA infection nor to guide or monitor treatment for MRSA infections. Performed at [facility], [address], [place], [place] [address]   Culture, blood (routine x 2)     Status: None (Preliminary result)   Collection Time: 02/27/20  9:53 AM   Specimen: BLOOD RIGHT WRIST  Result Value Ref Range Status   Specimen Description BLOOD RIGHT WRIST  Final   Special Requests   Final    BOTTLES DRAWN AEROBIC AND ANAEROBIC Blood Culture adequate volume   Culture   Final    NO GROWTH 2 DAYS Performed at [facility], [address] [place], [place] [address]    Report Status PENDING  Incomplete  Culture, blood (routine x 2)     Status: None (Preliminary result)   Collection Time: 02/27/20  9:54 AM   Specimen: BLOOD RIGHT FOREARM  Result Value Ref Range Status   Specimen Description BLOOD RIGHT FOREARM  Final   Special Requests   Final    BOTTLES DRAWN AEROBIC AND ANAEROBIC Blood Culture adequate volume  Culture   Final    NO GROWTH 2 DAYS Performed at [facility], [address], [place], [place] [address]    Report Status PENDING  Incomplete     Radiology Studies: No results found. DG CHEST PORT 1 VIEW  Final Result    DG Swallowing Func-Speech Pathology  Final Result    US Abdomen Limited RUQ  Final Result    DG Chest Port 1 View  Final Result      Scheduled Meds: . busPIRone  5 mg Oral BID  . Chlorhexidine Gluconate Cloth  6 each Topical Daily  . cloNIDine  0.1 mg Transdermal Weekly  . docusate sodium  100 mg Oral BID  . donepezil  5 mg Oral QHS  . enoxaparin (LOVENOX) injection  30 mg Subcutaneous Q24H  . hydrALAZINE  75 mg Oral TID  . insulin aspart  0-9 Units Subcutaneous TID WC  . insulin aspart  5 Units Subcutaneous TID WC  . insulin detemir  20 Units Subcutaneous Daily  . mouth rinse  15 mL Mouth Rinse BID   . metoprolol tartrate  25 mg Oral BID  . senna  1 tablet Oral BID  . sodium chloride flush  3 mL Intravenous Q12H   PRN Meds: acetaminophen **OR** acetaminophen, dextrose, hydrALAZINE, melatonin, morphine injection, ondansetron **OR** ondansetron (ZOFRAN) IV Continuous Infusions: . dextrose    . lactated ringers        LOS: 7 days  Time spent: Greater than 50% of the 35 minute visit was spent in counseling/coordination of care for the patient as laid out in the A&P.   Dwyane Dee, MD [facility] 02/29/2020, 1:19 PM

## 2020-02-29 NOTE — Hospital Course (Signed)
DAHMIR EPPERLY is a 86 y.o. year old male with medical history significant for dementia, HTN, CKDStage IV, PACs who presented on 2/9 with confusion and fever after recently being treated for UTI with Keflex and was found to have severe sepsis secondary to Pseudomonas bacteremia.   Hospital course complicated by DKA, paroxysmal atrial fibrillation, oropharyngeal dysphagia.  He was started on cefepime and he will complete a total of 7-day course which will conclude on 03/01/2020.  Repeat blood cultures have remained negative.  Overall he clinically improved from an infection standpoint.  He does have significant underlying dementia and remains at increased risk for falls and delirium in general.  He was evaluated by physical therapy during hospitalization with recommendations for SNF at discharge.  He was also evaluated by speech therapy and started on dysphagia 3 diet.  He will also need ongoing aspiration precautions.

## 2020-02-29 NOTE — Assessment & Plan Note (Signed)
-  Continue Levemir 20 units daily -Continue SSI and CBG monitoring -Continue NovoLog prandial coverage

## 2020-02-29 NOTE — Assessment & Plan Note (Signed)
-  Likely in setting of uncontrolled diabetes with severely elevated A1c and superimposed infection on admission -DKA has since resolved

## 2020-02-29 NOTE — Assessment & Plan Note (Addendum)
Continue current dose of hydralazine 75 mg 3 times daily, tolerating Lopressor 25 mg twice daily -Continue to monitor amlodipine may be necessary if remains elevated -Clonidine patch - stopped lasix

## 2020-02-29 NOTE — Assessment & Plan Note (Signed)
Occurred in setting of potential aspiration event. -Appreciate cardiology recommendations, continue oral Lopressor -Not a candidate for anticoagulation given dementia, and prior history of GI bleed

## 2020-02-29 NOTE — Assessment & Plan Note (Addendum)
-  Likely due to poor oral intake with free water deficit - improved again on D5W - repeat BMP again Monday  - if recurrent hyperNa likely bad prognosticator given advanced age/advanced dementia

## 2020-02-29 NOTE — Assessment & Plan Note (Signed)
-  Baseline creatinine around 3 and remains at baseline -Avoid nephrotoxic agents as able

## 2020-02-29 NOTE — Assessment & Plan Note (Signed)
-  Evaluated by SLP, appreciate assistance -Continue dysphagia 3 diet -Continue aspiration precautions

## 2020-02-29 NOTE — Assessment & Plan Note (Addendum)
-  Repeat blood cultures have remained negative -IV cefepime to be completed on 03/01/2020 - stable for discharging to SNF on Monday or when bed available

## 2020-02-29 NOTE — Assessment & Plan Note (Signed)
Continue Aricept 

## 2020-03-01 ENCOUNTER — Inpatient Hospital Stay (HOSPITAL_COMMUNITY): Payer: Medicare Other

## 2020-03-01 DIAGNOSIS — R140 Abdominal distension (gaseous): Secondary | ICD-10-CM | POA: Diagnosis not present

## 2020-03-01 DIAGNOSIS — A419 Sepsis, unspecified organism: Secondary | ICD-10-CM | POA: Diagnosis not present

## 2020-03-01 DIAGNOSIS — R6520 Severe sepsis without septic shock: Secondary | ICD-10-CM | POA: Diagnosis not present

## 2020-03-01 LAB — CBC
HCT: 27.2 % — ABNORMAL LOW (ref 39.0–52.0)
Hemoglobin: 8 g/dL — ABNORMAL LOW (ref 13.0–17.0)
MCH: 27.2 pg (ref 26.0–34.0)
MCHC: 29.4 g/dL — ABNORMAL LOW (ref 30.0–36.0)
MCV: 92.5 fL (ref 80.0–100.0)
Platelets: 271 10*3/uL (ref 150–400)
RBC: 2.94 MIL/uL — ABNORMAL LOW (ref 4.22–5.81)
RDW: 15.1 % (ref 11.5–15.5)
WBC: 9.8 10*3/uL (ref 4.0–10.5)
nRBC: 0 % (ref 0.0–0.2)

## 2020-03-01 LAB — BASIC METABOLIC PANEL
Anion gap: 10 (ref 5–15)
BUN: 72 mg/dL — ABNORMAL HIGH (ref 8–23)
CO2: 21 mmol/L — ABNORMAL LOW (ref 22–32)
Calcium: 8.2 mg/dL — ABNORMAL LOW (ref 8.9–10.3)
Chloride: 116 mmol/L — ABNORMAL HIGH (ref 98–111)
Creatinine, Ser: 2.87 mg/dL — ABNORMAL HIGH (ref 0.61–1.24)
GFR, Estimated: 18 mL/min — ABNORMAL LOW (ref >60)
Glucose, Bld: 274 mg/dL — ABNORMAL HIGH (ref 70–99)
Potassium: 3.5 mmol/L (ref 3.5–5.1)
Sodium: 147 mmol/L — ABNORMAL HIGH (ref 135–145)

## 2020-03-01 LAB — GLUCOSE, CAPILLARY
Glucose-Capillary: 275 mg/dL — ABNORMAL HIGH (ref 70–99)
Glucose-Capillary: 293 mg/dL — ABNORMAL HIGH (ref 70–99)
Glucose-Capillary: 174 mg/dL — ABNORMAL HIGH (ref 70–99)
Glucose-Capillary: 149 mg/dL — ABNORMAL HIGH (ref 70–99)

## 2020-03-01 LAB — MAGNESIUM: Magnesium: 2.3 mg/dL (ref 1.7–2.4)

## 2020-03-01 MED ORDER — POLYETHYLENE GLYCOL 3350 17 G PO PACK
17.0000 g | PACK | Freq: Every day | ORAL | Status: DC
  Administered 2020-03-01 – 2020-03-06 (×5): 17 g via ORAL
  Filled 2020-03-01 (×6): qty 1

## 2020-03-01 NOTE — Assessment & Plan Note (Signed)
-   possibly from opioid induced constipation - check xray - stop morphine - continue senna; add miralax

## 2020-03-01 NOTE — Progress Notes (Signed)
PROGRESS NOTE    TIMATHY NEWBERRY   NWG:[id number]  DOB: 13-Apr-1928  DOA: 02/22/2020     8  PCP: Alanson Puls, [facility]  CC: fever, AMS  Hospital Course: SLATE DEBROUX is a 86 y.o. year old male with medical history significant for dementia, HTN, CKDStage IV, PACs who presented on 2/9 with confusion and fever after recently being treated for UTI with Keflex and was found to have severe sepsis secondary to Pseudomonas bacteremia.   Hospital course complicated by DKA, paroxysmal atrial fibrillation, oropharyngeal dysphagia.  He was started on cefepime and he will complete a total of 7-day course which will conclude on 03/01/2020.  Repeat blood cultures have remained negative.  Overall he clinically improved from an infection standpoint.  He does have significant underlying dementia and remains at increased risk for falls and delirium in general.  He was evaluated by physical therapy during hospitalization with recommendations for SNF at discharge.  He was also evaluated by speech therapy and started on dysphagia 3 diet.  He will also need ongoing aspiration precautions.    Interval History:  No events overnight.  Son present bedside today.  Update given with questions answered.  Patient otherwise comfortably resting in bed in his usual demented state.  Old records reviewed in assessment of this patient  ROS: Review of systems not obtained due to patient factors.  Severe dementia  Assessment & Plan: * Severe sepsis with acute organ dysfunction (HCC)-resolved as of 02/29/2020 -See bacteremia  Bacteremia due to Pseudomonas -Repeat blood cultures have remained negative -IV cefepime to be completed on 03/01/2020 - stable for discharging to SNF on Monday or when bed available  Acute metabolic encephalopathy-resolved as of 02/29/2020 -Multifactorial in setting of underlying infection, dementia, and hospital-acquired delirium -Continue reorienting as needed.  Overall he is able  to answer some questions and interact appropriately, as well as follow commands  Dementia ([place]) -Continue Aricept  Hypernatremia -Likely due to poor oral intake with free water deficit - improved again on D5W - repeat BMP again Monday  - if recurrent hyperNa likely bad prognosticator given advanced age/advanced dementia  Abdominal distension - possibly from opioid induced constipation - check xray - stop morphine - continue senna; add miralax   Hypertension Continue current dose of hydralazine 75 mg 3 times daily, tolerating Lopressor 25 mg twice daily -Continue to monitor amlodipine may be necessary if remains elevated -Clonidine patch - stopped lasix   Oropharyngeal dysphagia -Evaluated by SLP, appreciate assistance -Continue dysphagia 3 diet -Continue aspiration precautions  AF (paroxysmal atrial fibrillation) (HCC) Occurred in setting of potential aspiration event. -Appreciate cardiology recommendations, continue oral Lopressor -Not a candidate for anticoagulation given dementia, and prior history of GI bleed   CKD (chronic kidney disease) stage 4, GFR 15-29 ml/min (HCC) -Baseline creatinine around 3 and remains at baseline -Avoid nephrotoxic agents as able  Diabetes mellitus type 2, uncontrolled, with complications (HCC) -Continue Levemir 20 units daily -Continue SSI and CBG monitoring -Continue NovoLog prandial coverage  DKA, type 2 (HCC)-resolved as of 02/29/2020 -Likely in setting of uncontrolled diabetes with severely elevated A1c and superimposed infection on admission -DKA has since resolved   Antimicrobials: Cefepime, completion date 03/01/2020  DVT prophylaxis: Lovenox Code Status: Full Family Communication: None present Disposition Plan: Status is: Inpatient  Remains inpatient appropriate because:Unsafe d/c plan, IV treatments appropriate due to intensity of illness or inability to take PO and Inpatient level of care appropriate due to severity of  illness   Dispo:  The patient is from: Home              Anticipated d/c is to: SNF              Anticipated d/c date is: 1 day              Patient currently is not medically stable to d/c.  Objective: Blood pressure (!) 180/64, pulse 81, temperature 97.6 F (36.4 C), temperature source Oral, resp. rate 19, height 6' (1.829 m), weight 83.1 kg, SpO2 100 %.  Examination: General appearance: Elderly demented seen in bed in no distress able to follow some commands Head: Normocephalic, without obvious abnormality, atraumatic Eyes: EOMI Lungs: clear to auscultation bilaterally Heart: regular rate and rhythm and S1, S2 normal Abdomen: normal findings: bowel sounds normal and soft, non-tender Extremities: No edema Skin: mobility and turgor normal Neurologic: Moves all 4 extremities, follows some commands  Consultants:   Cardiology  Procedures:   Barium swallow, 02/26/2020  Data Reviewed: I have personally reviewed following labs and imaging studies Results for orders placed or performed during the hospital encounter of 02/22/20 (from the past 24 hour(s))  Glucose, capillary     Status: Abnormal   Collection Time: 02/29/20  4:52 PM  Result Value Ref Range   Glucose-Capillary 142 (H) 70 - 99 mg/dL  Glucose, capillary     Status: Abnormal   Collection Time: 02/29/20  9:19 PM  Result Value Ref Range   Glucose-Capillary 125 (H) 70 - 99 mg/dL   Comment 1 Notify RN    Comment 2 Document in Chart   Basic metabolic panel     Status: Abnormal   Collection Time: 03/01/20  6:22 AM  Result Value Ref Range   Sodium 147 (H) 135 - 145 mmol/L   Potassium 3.5 3.5 - 5.1 mmol/L   Chloride 116 (H) 98 - 111 mmol/L   CO2 21 (L) 22 - 32 mmol/L   Glucose, Bld 274 (H) 70 - 99 mg/dL   BUN 72 (H) 8 - 23 mg/dL   Creatinine, Ser 2.87 (H) 0.61 - 1.24 mg/dL   Calcium 8.2 (L) 8.9 - 10.3 mg/dL   GFR, Estimated 18 (L) >60 mL/min   Anion gap 10 5 - 15  CBC     Status: Abnormal   Collection Time: 03/01/20   6:22 AM  Result Value Ref Range   WBC 9.8 4.0 - 10.5 K/uL   RBC 2.94 (L) 4.22 - 5.81 MIL/uL   Hemoglobin 8.0 (L) 13.0 - 17.0 g/dL   HCT 27.2 (L) 39 - 52 %   MCV 92.5 80.0 - 100.0 fL   MCH 27.2 26.0 - 34.0 pg   MCHC 29.4 (L) 30.0 - 36.0 g/dL   RDW 15.1 11.5 - 15.5 %   Platelets 271 150 - 400 K/uL   nRBC 0.0 0.0 - 0.2 %  Magnesium     Status: None   Collection Time: 03/01/20  6:22 AM  Result Value Ref Range   Magnesium 2.3 1.7 - 2.4 mg/dL  Glucose, capillary     Status: Abnormal   Collection Time: 03/01/20  8:32 AM  Result Value Ref Range   Glucose-Capillary 275 (H) 70 - 99 mg/dL  Glucose, capillary     Status: Abnormal   Collection Time: 03/01/20 11:11 AM  Result Value Ref Range   Glucose-Capillary 293 (H) 70 - 99 mg/dL    Recent Results (from the past 240 hour(s))  Urine culture     Status:  Abnormal   Collection Time: 02/22/20 12:05 PM   Specimen: In/Out Cath Urine  Result Value Ref Range Status   Specimen Description   Final    IN/OUT CATH URINE Performed at [facility], [address] [place], [place] [address]    Special Requests   Final    NONE Performed at [facility], [address], [place], [place] [address]    Culture 20,000 COLONIES/mL PSEUDOMONAS AERUGINOSA (A)  Final   Report Status 02/25/2020 FINAL  Final   Organism ID, Bacteria PSEUDOMONAS AERUGINOSA (A)  Final      Susceptibility   Pseudomonas aeruginosa - MIC*    CEFTAZIDIME 4 SENSITIVE Sensitive     CIPROFLOXACIN <=0.25 SENSITIVE Sensitive     GENTAMICIN <=1 SENSITIVE Sensitive     IMIPENEM 2 SENSITIVE Sensitive     PIP/TAZO 8 SENSITIVE Sensitive     CEFEPIME 2 SENSITIVE Sensitive     * 20,000 COLONIES/mL PSEUDOMONAS AERUGINOSA  Respiratory Panel by RT PCR (Flu A&B, Covid) - Nasopharyngeal Swab     Status: None   Collection Time: 02/22/20 12:10 PM   Specimen: Nasopharyngeal Swab  Result Value Ref Range Status   SARS Coronavirus 2 by RT PCR NEGATIVE NEGATIVE Final    Comment: (NOTE) SARS-CoV-2  target nucleic acids are NOT DETECTED.  The SARS-CoV-2 RNA is generally detectable in upper respiratoy specimens during the acute phase of infection. The lowest concentration of SARS-CoV-2 viral copies this assay can detect is 131 copies/mL. A negative result does not preclude SARS-Cov-2 infection and should not be used as the sole basis for treatment or other patient management decisions. A negative result may occur with  improper specimen collection/handling, submission of specimen other than nasopharyngeal swab, presence of viral mutation(s) within the areas targeted by this assay, and inadequate number of viral copies (<131 copies/mL). A negative result must be combined with clinical observations, patient history, and epidemiological information. The expected result is Negative.  Fact Sheet for Patients:  PinkCheek.be  Fact Sheet for Healthcare Providers:  GravelBags.it  This test is no t yet approved or cleared by the [place] FDA and  has been authorized for detection and/or diagnosis of SARS-CoV-2 by FDA under an Emergency Use Authorization (EUA). This EUA will remain  in effect (meaning this test can be used) for the duration of the COVID-19 declaration under Section 564(b)(1) of the Act, 21 U.S.C. section 360bbb-3(b)(1), unless the authorization is terminated or revoked sooner.     Influenza A by PCR NEGATIVE NEGATIVE Final   Influenza B by PCR NEGATIVE NEGATIVE Final    Comment: (NOTE) The Xpert Xpress SARS-CoV-2/FLU/RSV assay is intended as an aid in  the diagnosis of influenza from Nasopharyngeal swab specimens and  should not be used as a sole basis for treatment. Nasal washings and  aspirates are unacceptable for Xpert Xpress SARS-CoV-2/FLU/RSV  testing.  Fact Sheet for Patients: PinkCheek.be  Fact Sheet for Healthcare  Providers: GravelBags.it  This test is not yet approved or cleared by the [place] FDA and  has been authorized for detection and/or diagnosis of SARS-CoV-2 by  FDA under an Emergency Use Authorization (EUA). This EUA will remain  in effect (meaning this test can be used) for the duration of the  Covid-19 declaration under Section 564(b)(1) of the Act, 21  U.S.C. section 360bbb-3(b)(1), unless the authorization is  terminated or revoked. Performed at [facility], [address], [place], [place] [address]   Blood Culture (routine x 2)  Status: Abnormal   Collection Time: 02/22/20 12:20 PM   Specimen: BLOOD LEFT HAND  Result Value Ref Range Status   Specimen Description   Final    BLOOD LEFT HAND BOTTLES DRAWN AEROBIC AND ANAEROBIC Performed at [facility], [address], [place], [place] [address]    Special Requests   Final    Blood Culture adequate volume Performed at [facility], [address] [place], [place] [address]    Culture  Setup Time   Final    GRAM NEGATIVE RODS Gram Stain Report Called to,Read Back By and Verified With: DANIELS,J @ 1245 ON 02/23/20 BY JUW AEROBIC BOTTLE ONLY GS DONE @ [facility] Performed at [facility], [address], [place], [place] [address]    Culture (A)  Final    PSEUDOMONAS AERUGINOSA SUSCEPTIBILITIES PERFORMED ON PREVIOUS CULTURE WITHIN THE LAST 5 DAYS. Performed at [facility], [place] [address], [place], [place] [address]    Report Status 02/25/2020 FINAL  Final  Blood Culture (routine x 2)     Status: Abnormal   Collection Time: 02/22/20 12:20 PM   Specimen: Right Antecubital; Blood  Result Value Ref Range Status   Specimen Description   Final    RIGHT ANTECUBITAL BOTTLES DRAWN AEROBIC AND ANAEROBIC Performed at [facility], [address] [place], [place] [address]    Special Requests   Final    Blood Culture adequate volume Performed at [facility], [address], [place],   [address]    Culture  Setup Time   Final    GRAM NEGATIVE RODS Gram Stain Report Called to,Read Back By and Verified With: DANIELS,J @ 9379 ON 02/23/20 BY JUW AEROBIC BOTTLE ONLY GS DONE @ [facility]    Culture PSEUDOMONAS AERUGINOSA (A)  Final   Report Status 02/25/2020 FINAL  Final   Organism ID, Bacteria PSEUDOMONAS AERUGINOSA  Final      Susceptibility   Pseudomonas aeruginosa - MIC*    CEFTAZIDIME 4 SENSITIVE Sensitive     CIPROFLOXACIN <=0.25 SENSITIVE Sensitive     GENTAMICIN <=1 SENSITIVE Sensitive     IMIPENEM 2 SENSITIVE Sensitive     PIP/TAZO 8 SENSITIVE Sensitive     CEFEPIME 2 SENSITIVE Sensitive     * PSEUDOMONAS AERUGINOSA  Blood Culture ID Panel (Reflexed)     Status: Abnormal   Collection Time: 02/22/20 12:20 PM  Result Value Ref Range Status   Enterococcus faecalis NOT DETECTED NOT DETECTED Final   Enterococcus Faecium NOT DETECTED NOT DETECTED Final   Listeria monocytogenes NOT DETECTED NOT DETECTED Final   Staphylococcus species NOT DETECTED NOT DETECTED Final   Staphylococcus aureus (BCID) NOT DETECTED NOT DETECTED Final   Staphylococcus epidermidis NOT DETECTED NOT DETECTED Final   Staphylococcus lugdunensis NOT DETECTED NOT DETECTED Final   Streptococcus species NOT DETECTED NOT DETECTED Final   Streptococcus agalactiae NOT DETECTED NOT DETECTED Final   Streptococcus pneumoniae NOT DETECTED NOT DETECTED Final   Streptococcus pyogenes NOT DETECTED NOT DETECTED Final   A.calcoaceticus-baumannii NOT DETECTED NOT DETECTED Final   Bacteroides fragilis NOT DETECTED NOT DETECTED Final   Enterobacterales NOT DETECTED NOT DETECTED Final   Enterobacter cloacae complex NOT DETECTED NOT DETECTED Final   Escherichia coli NOT DETECTED NOT DETECTED Final   Klebsiella aerogenes NOT DETECTED NOT DETECTED Final   Klebsiella oxytoca NOT DETECTED NOT DETECTED Final   Klebsiella pneumoniae NOT DETECTED NOT DETECTED Final   Proteus species NOT DETECTED NOT DETECTED Final   Salmonella  species NOT  DETECTED NOT DETECTED Final   Serratia marcescens NOT DETECTED NOT DETECTED Final   Haemophilus influenzae NOT DETECTED NOT DETECTED Final   Neisseria meningitidis NOT DETECTED NOT DETECTED Final   Pseudomonas aeruginosa DETECTED (A) NOT DETECTED Final    Comment: CRITICAL RESULT CALLED TO, READ BACK BY AND VERIFIED WITH: Hyman Hopes RN, AT 1245 02/23/20 BY D. VANHOOK    Stenotrophomonas maltophilia NOT DETECTED NOT DETECTED Final   Candida albicans NOT DETECTED NOT DETECTED Final   Candida auris NOT DETECTED NOT DETECTED Final   Candida glabrata NOT DETECTED NOT DETECTED Final   Candida krusei NOT DETECTED NOT DETECTED Final   Candida parapsilosis NOT DETECTED NOT DETECTED Final   Candida tropicalis NOT DETECTED NOT DETECTED Final   Cryptococcus neoformans/gattii NOT DETECTED NOT DETECTED Final   CTX-M ESBL NOT DETECTED NOT DETECTED Final   Carbapenem resistance IMP NOT DETECTED NOT DETECTED Final   Carbapenem resistance KPC NOT DETECTED NOT DETECTED Final   Carbapenem resistance NDM NOT DETECTED NOT DETECTED Final   Carbapenem resistance VIM NOT DETECTED NOT DETECTED Final    Comment: Performed at [facility], [address], [place], [place] [address]  MRSA PCR Screening     Status: None   Collection Time: 02/22/20 12:28 PM   Specimen: Nasopharyngeal  Result Value Ref Range Status   MRSA by PCR NEGATIVE NEGATIVE Final    Comment:        The GeneXpert MRSA Assay (FDA approved for NASAL specimens only), is one component of a comprehensive MRSA colonization surveillance program. It is not intended to diagnose MRSA infection nor to guide or monitor treatment for MRSA infections. Performed at [facility], [address] [place], [place] [address]   Culture, blood (routine x 2)     Status: None (Preliminary result)   Collection Time: 02/27/20  9:53 AM   Specimen: BLOOD RIGHT WRIST  Result Value Ref Range Status   Specimen Description BLOOD RIGHT WRIST   Final   Special Requests   Final    BOTTLES DRAWN AEROBIC AND ANAEROBIC Blood Culture adequate volume   Culture   Final    NO GROWTH 2 DAYS Performed at [facility], [address] [place], [place] [address]    Report Status PENDING  Incomplete  Culture, blood (routine x 2)     Status: None (Preliminary result)   Collection Time: 02/27/20  9:54 AM   Specimen: BLOOD RIGHT FOREARM  Result Value Ref Range Status   Specimen Description BLOOD RIGHT FOREARM  Final   Special Requests   Final    BOTTLES DRAWN AEROBIC AND ANAEROBIC Blood Culture adequate volume   Culture   Final    NO GROWTH 2 DAYS Performed at [facility], [address], [place], [place] [address]    Report Status PENDING  Incomplete     Radiology Studies: No results found. DG CHEST PORT 1 VIEW  Final Result    DG Swallowing Func-Speech Pathology  Final Result    US Abdomen Limited RUQ  Final Result    DG Chest Port 1 View  Final Result    DG Abd 1 View    (Results Pending)    Scheduled Meds:  busPIRone  5 mg Oral BID   Chlorhexidine Gluconate Cloth  6 each Topical Daily   cloNIDine  0.1 mg Transdermal Weekly   docusate sodium  100 mg Oral BID   donepezil  5 mg Oral QHS   enoxaparin (LOVENOX) injection  30  mg Subcutaneous Daily   hydrALAZINE  75 mg Oral TID   insulin aspart  0-9 Units Subcutaneous TID WC   insulin aspart  5 Units Subcutaneous TID WC   insulin detemir  20 Units Subcutaneous Daily   mouth rinse  15 mL Mouth Rinse BID   metoprolol tartrate  25 mg Oral BID   polyethylene glycol  17 g Oral Daily   senna  1 tablet Oral BID   sodium chloride flush  3 mL Intravenous Q12H   PRN Meds: acetaminophen **OR** acetaminophen, dextrose, hydrALAZINE, melatonin, ondansetron **OR** ondansetron (ZOFRAN) IV Continuous Infusions:  dextrose 75 mL/hr at 03/01/20 0300   lactated ringers        LOS: 8 days  Time spent: Greater than 50% of the 35 minute visit was spent in  counseling/coordination of care for the patient as laid out in the A&P.   Dwyane Dee, MD [facility] 03/01/2020, 1:35 PM

## 2020-03-02 DIAGNOSIS — E870 Hyperosmolality and hypernatremia: Secondary | ICD-10-CM

## 2020-03-02 DIAGNOSIS — R404 Transient alteration of awareness: Secondary | ICD-10-CM | POA: Diagnosis not present

## 2020-03-02 DIAGNOSIS — F0390 Unspecified dementia without behavioral disturbance: Secondary | ICD-10-CM | POA: Diagnosis not present

## 2020-03-02 DIAGNOSIS — I10 Essential (primary) hypertension: Secondary | ICD-10-CM | POA: Diagnosis not present

## 2020-03-02 DIAGNOSIS — A419 Sepsis, unspecified organism: Secondary | ICD-10-CM

## 2020-03-02 DIAGNOSIS — R6520 Severe sepsis without septic shock: Secondary | ICD-10-CM

## 2020-03-02 LAB — CBC
HCT: 25.4 % — ABNORMAL LOW (ref 39.0–52.0)
Hemoglobin: 7.8 g/dL — ABNORMAL LOW (ref 13.0–17.0)
MCH: 27.1 pg (ref 26.0–34.0)
MCHC: 30.7 g/dL (ref 30.0–36.0)
MCV: 88.2 fL (ref 80.0–100.0)
Platelets: 322 10*3/uL (ref 150–400)
RBC: 2.88 MIL/uL — ABNORMAL LOW (ref 4.22–5.81)
RDW: 14.9 % (ref 11.5–15.5)
WBC: 9.8 10*3/uL (ref 4.0–10.5)
nRBC: 0 % (ref 0.0–0.2)

## 2020-03-02 LAB — BASIC METABOLIC PANEL
Anion gap: 13 (ref 5–15)
Anion gap: 11 (ref 5–15)
BUN: 71 mg/dL — ABNORMAL HIGH (ref 8–23)
BUN: 68 mg/dL — ABNORMAL HIGH (ref 8–23)
CO2: 24 mmol/L (ref 22–32)
CO2: 24 mmol/L (ref 22–32)
Calcium: 8.4 mg/dL — ABNORMAL LOW (ref 8.9–10.3)
Calcium: 8.2 mg/dL — ABNORMAL LOW (ref 8.9–10.3)
Chloride: 115 mmol/L — ABNORMAL HIGH (ref 98–111)
Chloride: 114 mmol/L — ABNORMAL HIGH (ref 98–111)
Creatinine, Ser: 2.73 mg/dL — ABNORMAL HIGH (ref 0.61–1.24)
Creatinine, Ser: 2.7 mg/dL — ABNORMAL HIGH (ref 0.61–1.24)
GFR, Estimated: 20 mL/min — ABNORMAL LOW (ref >60)
GFR, Estimated: 20 mL/min — ABNORMAL LOW (ref >60)
Glucose, Bld: 156 mg/dL — ABNORMAL HIGH (ref 70–99)
Glucose, Bld: 147 mg/dL — ABNORMAL HIGH (ref 70–99)
Potassium: 3.4 mmol/L — ABNORMAL LOW (ref 3.5–5.1)
Potassium: 3.3 mmol/L — ABNORMAL LOW (ref 3.5–5.1)
Sodium: 152 mmol/L — ABNORMAL HIGH (ref 135–145)
Sodium: 149 mmol/L — ABNORMAL HIGH (ref 135–145)

## 2020-03-02 LAB — GLUCOSE, CAPILLARY
Glucose-Capillary: 145 mg/dL — ABNORMAL HIGH (ref 70–99)
Glucose-Capillary: 213 mg/dL — ABNORMAL HIGH (ref 70–99)
Glucose-Capillary: 108 mg/dL — ABNORMAL HIGH (ref 70–99)
Glucose-Capillary: 131 mg/dL — ABNORMAL HIGH (ref 70–99)

## 2020-03-02 LAB — MAGNESIUM: Magnesium: 2.5 mg/dL — ABNORMAL HIGH (ref 1.7–2.4)

## 2020-03-02 MED ORDER — POTASSIUM CHLORIDE 20 MEQ PO PACK
40.0000 meq | PACK | Freq: Two times a day (BID) | ORAL | Status: AC
  Administered 2020-03-02 (×2): 40 meq via ORAL
  Filled 2020-03-02 (×2): qty 2

## 2020-03-02 MED ORDER — DEXTROSE 5 % IV SOLN: INTRAVENOUS | Status: DC

## 2020-03-02 NOTE — Progress Notes (Signed)
Dr Alan Ripper rounding note reviewed. Tele reviewed, remains in SR. No additional cardiology recs at this time, we will sign off inpatient care.    Carlyle Dolly MD

## 2020-03-02 NOTE — Progress Notes (Signed)
  Speech Language Pathology Treatment: Dysphagia  Patient Details Name: Paul Perez MRN: [id number] DOB: 02-21-1928 Today's Date: 03/02/2020 Time: 3570-1779 SLP Time Calculation (min) (ACUTE ONLY): 23 min  Assessment / Plan / Recommendation Clinical Impression  Pt seen for ongoing dysphagia intervention. He was noted to have thin, orange drink at bedside. SLP assessed Pt with cup and straw sips of thin water. He required assist for repositioning due to leaning to the left in bed. He showed no overt signs of reduced airway protection, however continues with audible swallow. Pt was noted to have trace, silent aspiration during MBSS last week. SLP checked in with nursing regarding thin liquid at bedside and RN reported that it was potassium powder mixed with water and that Pt appeared to tolerate well. SLP reviewed elevated HR last week as being possible sign of aspiration and RN stated that she did just receive a phone call that Pt's HR was just elevated (after thins). Will continue diet as ordered for now, D3 and NTL with thin water/ice chips after oral care and between meals.     HPI HPI: This 86 y.o.malewith medical history significant ofDementia,DiabetesMellitus II, Hypertension, CKD stage IV, premature atrial contractions,history of GI bleed, presents in the emergency department with confusion and fever. This is patient's third ER visit In last three weeks.Last week he presented in the ED with generalized weakness and pain,found to have a UTI . He was discharged on Keflex. Daughter reportshe has been having worsening pain and weakness, was not able to stand up in the morning. Patient is admitted for sepsis secondary to Pseudomonas bacteremia.  He was also found to have high anion gap metabolic acidosis secondary to DKA which has been resolved. He is transitioned to subcu insulin. BSE requested.       SLP Plan  Continue with current plan of care       Recommendations  Diet  recommendations: Dysphagia 3 (mechanical soft);Nectar-thick liquid Liquids provided via: Cup;Straw Medication Administration: Whole meds with puree Supervision: Staff to assist with self feeding;Full supervision/cueing for compensatory strategies Compensations: Slow rate;Small sips/bites Postural Changes and/or Swallow Maneuvers: Seated upright 90 degrees;Upright 30-60 min after meal                Oral Care Recommendations: Oral care BID;Staff/trained caregiver to provide oral care Follow up Recommendations: 24 hour supervision/assistance SLP Visit Diagnosis: Dysphagia, oropharyngeal phase (R13.12) Plan: Continue with current plan of care       Thank you,  Genene Churn, [place]                 [place] 03/02/2020, 4:16 PM

## 2020-03-02 NOTE — TOC Progression Note (Signed)
Transition of Care [facility]) - Progression Note    Patient Details  Name: Paul Perez MRN: [id number] Date of Birth: 1928-05-09  Transition of Care [facility] CM/SW Contact  Salome Arnt, [place] Phone Number: 03/02/2020, 11:14 AM  Clinical Narrative:  [place] received. LCSW updated Debbie at [facility] on pt. Pt's authorization is still valid. TOC will continue to follow. Anticipate d/c tomorrow per MD.      Expected Discharge Plan: [place] Barriers to Discharge: ED Awaiting State Approval (PASRR)  Expected Discharge Plan and Services Expected Discharge Plan: [place] arrangements for the past 2 months: Assisted Living Facility                                       Social Determinants of Health (SDOH) Interventions    Readmission Risk Interventions No flowsheet data found.

## 2020-03-02 NOTE — Progress Notes (Signed)
Patient's PASRR number has been received. PASRR is: [id number].

## 2020-03-02 NOTE — Progress Notes (Signed)
[facility]  PROGRESS NOTE  Paul Perez EXB:[id number] DOB: 1927/11/22 DOA: 02/22/2020 PCP: Alanson Puls [facility] date - 02/22/2020   Admitting Physician Shawna Clamp, MD  Outpatient Primary MD for the patient is [facility]  LOS - 9 Brief Narrative   Paul Perez is a 86 y.o. year old male with medical history significant for Beatties, dementia, HTN, CKDStage IV, PACs who presented on 2/9 with confusion and fever after recently being treated for UTI with Keflex and was found to have severe sepsis secondary to Pseudomonas bacteremia.  Hospital course complicated by DKA, paroxysmal atrial fibrillation, oropharyngeal dysphagia.  Subjective  No acute events overnight.  Patient is lying in bed in no acute distress states he is not hungry or thirsty  A & P  Mild oropharyngeal dysphagia.  Underwent barium swallow on 10/13.  Chest x-ray on 10/13 unremarkable -Speech recommends dysphagia 3 diet, closely monitor. -Aspiration precautions  Paroxysmal atrial fibrillation with RVR, currently in normal sinus rhythm and rate controlled.  Occurred in setting of potential aspiration event. -Appreciate cardiology recommendations, continue oral Lopressor 25 mg twice daily -Not a candidate for anticoagulation given dementia, and prior history of GI bleed   Hypertension, not at goal, but much improved.  SBP in the 150-160s -Continue current dose of hydralazine 75 mg 3 times daily, tolerating Lopressor 25 mg twice daily, clonidine patch -Continue to monitor amlodipine may be necessary if remains elevated -Clonidine patch, Lasix  Severe sepsis secondary to pseudomonal bacteremia, resolved sepsis physiology resolved, currently afebrile -Completed 7-day course of IV cefepime, end date 10/17 -Repeat blood cultures unremarkable  DKA, resolved Type 2 diabetes, poorly controlled A1c greater than 15.  CBGs range 140s-210 - Levemir to 20 U, NovoLog to 5 units 3 times daily  as long as eating greater than 50% of meals -Continue to monitor CBGs -Sliding scale as needed with meals  Mood disorder, stable -BuSpar  Hypernatremia,recurrent.  Sodium 152.  Suspect decreased oral intake in the setting of baseline dementia.  This is likely a poor prognosticating factor given this tends to correct her inpatient does not seem to have any thirst/hunger cues. -Resume D5 over 24 hours, repeat BMP this afternoon -Palliative care to assist with goals of care discussions with family -Monitor BMP daily  Elevated liver enzymes, resolved.  Right upper quad ultrasound showed cholelithiasis.  CKD, stage IV, creatinine stable at baseline.  Did have peak creatinine of 3.58 during hospitalization for which nephrology recommended continue supportive care -Avoid nephrotoxic agents  Acute encephalopathy, multifactorial (infectious, metabolic), waxes and wanes.  Alert to self this morning, intermittently able to tell me his name and that he is in no pain.  In setting of dementia for which baseline is able to have conversation per daughter.  Likely further complicated by hospital-acquired delirium -Delirium precautions -Expect improvement with treatment of medical issues -Continue home Aricept -Continue goals of care discussions  Goals of care  there is no healthcare power of attorney  decisions for him to be made jointly amongst siblings I have had conversations with his daughter Paul Perez, --currently full code/full scope --Palliative consulted to assist with [place] discussions    Family Communication  : Spoke with daughter Paul Perez at bedside on 10/14, will update today  Code Status : Full code, discussed patient's desire of advanced directives with Paul Perez who states he has no specific directives.  She has initiated conversations with her brothers regarding goals of care  Disposition Plan  :  Patient is  from ALF. Anticipated d/c date: 2 to 3 days. Barriers to d/c or necessity for  inpatient status:  Needs to continue IV cefepime, close monitoring of A. fib and hypernatremia, close monitoring of BP and mental status.  Once medically stable plan for SNF Consults  : Cardiology  Procedures  : Barium swallow, 10/13  DVT Prophylaxis  :  Lovenox   MDM: The below labs and imaging reports were reviewed and summarized above.  Medication management as above.  Lab Results  Component Value Date   PLT 322 03/02/2020    Diet :  Diet Order            DIET DYS 3 Room service appropriate? Yes; Fluid consistency: Nectar Thick  Diet effective now                  Inpatient Medications Scheduled Meds: . busPIRone  5 mg Oral BID  . Chlorhexidine Gluconate Cloth  6 each Topical Daily  . cloNIDine  0.1 mg Transdermal Weekly  . docusate sodium  100 mg Oral BID  . donepezil  5 mg Oral QHS  . enoxaparin (LOVENOX) injection  30 mg Subcutaneous Daily  . hydrALAZINE  75 mg Oral TID  . insulin aspart  0-9 Units Subcutaneous TID WC  . insulin aspart  5 Units Subcutaneous TID WC  . insulin detemir  20 Units Subcutaneous Daily  . mouth rinse  15 mL Mouth Rinse BID  . metoprolol tartrate  25 mg Oral BID  . polyethylene glycol  17 g Oral Daily  . senna  1 tablet Oral BID  . sodium chloride flush  3 mL Intravenous Q12H   Continuous Infusions: . dextrose 75 mL/hr at 03/02/20 0805  . lactated ringers     PRN Meds:.acetaminophen **OR** acetaminophen, dextrose, hydrALAZINE, melatonin, ondansetron **OR** ondansetron (ZOFRAN) IV  Antibiotics  :   Anti-infectives (From admission, onward)   Start     Dose/Rate Route Frequency Ordered Stop   02/24/20 1300  vancomycin (VANCOCIN) IVPB 1000 mg/200 mL premix  Status:  Discontinued        1,000 mg 200 mL/hr over 60 Minutes Intravenous Every 48 hours 02/22/20 1331 02/24/20 0839   02/23/20 1500  ceFEPIme (MAXIPIME) 2 g in sodium chloride 0.9 % 100 mL IVPB        2 g 200 mL/hr over 30 Minutes Intravenous Every 24 hours 02/22/20 1457  02/28/20 1731   02/22/20 2000  metroNIDAZOLE (FLAGYL) IVPB 500 mg  Status:  Discontinued        500 mg 100 mL/hr over 60 Minutes Intravenous Every 8 hours 02/22/20 1549 02/24/20 0839   02/22/20 1300  vancomycin (VANCOREADY) IVPB 2000 mg/400 mL        2,000 mg 200 mL/hr over 120 Minutes Intravenous  Once 02/22/20 1219 02/22/20 1545   02/22/20 1215  ceFEPIme (MAXIPIME) 2 g in sodium chloride 0.9 % 100 mL IVPB        2 g 200 mL/hr over 30 Minutes Intravenous  Once 02/22/20 1207 02/22/20 1406   02/22/20 1215  metroNIDAZOLE (FLAGYL) IVPB 500 mg        500 mg 100 mL/hr over 60 Minutes Intravenous  Once 02/22/20 1207 02/22/20 1406   02/22/20 1215  vancomycin (VANCOCIN) IVPB 1000 mg/200 mL premix  Status:  Discontinued        1,000 mg 200 mL/hr over 60 Minutes Intravenous  Once 02/22/20 1207 02/22/20 1219       Objective   Vitals:  03/01/20 0603 03/01/20 0929 03/01/20 1659 03/01/20 2137  BP: 110/72 (!) 180/64 (!) 158/53 (!) 151/79  Pulse: 62 81 (!) 58 79  Resp: 19  19 20   Temp: 97.6 F (36.4 C)  97.9 F (36.6 C) 98.3 F (36.8 C)  TempSrc: Oral  Oral Oral  SpO2: 100%  100% 97%  Weight:      Height:        SpO2: 97 % O2 Flow Rate (L/min): 2 L/min  Wt Readings from Last 3 Encounters:  02/28/20 83.1 kg  02/17/20 90.7 kg  02/14/20 87.1 kg     Intake/Output Summary (Last 24 hours) at 03/02/2020 1411 Last data filed at 03/01/2020 1900 Gross per 24 hour  Intake 320 ml  Output --  Net 320 ml    Physical Exam:  Lying in bed comfortably, Alert, only oriented to self, denies any pain, able to follow commands intermittently, confused, distractible No new F.N deficits,  Swelling in upper extremities (Right arm > Left)with scattered bruising [place].AT, Dry oral mucosa Normal respiratory effort on room air RRR, SEM present, no peripheral edema noted   Abdomen soft, non-distended  I have personally reviewed the following:   Data Reviewed:  CBC Recent Labs  Lab  02/27/20 0953 02/28/20 0542 02/29/20 0708 03/01/20 0622 03/02/20 0609  WBC 10.7* 10.7* 11.4* 9.8 9.8  HGB 9.3* 8.1* 8.4* 8.0* 7.8*  HCT 29.8* 26.0* 28.2* 27.2* 25.4*  PLT 203 216 267 271 322  MCV 86.1 86.7 89.0 92.5 88.2  MCH 26.9 27.0 26.5 27.2 27.1  MCHC 31.2 31.2 29.8* 29.4* 30.7  RDW 14.3 14.6 14.7 15.1 14.9    Chemistries  Recent Labs  Lab 02/25/20 0339 02/25/20 0339 02/26/20 0334 02/27/20 0953 02/28/20 1500 02/28/20 1606 02/29/20 0708 03/01/20 0622 03/02/20 0609  NA 145   < > 145   < > 128* 149* 152* 147* 152*  K 3.7   < > 3.7   < > 3.6 3.4* 3.5 3.5 3.4*  CL 112*   < > 113*   < > 96* 113* 115* 116* 115*  CO2 23   < > 22   < > 25 25 25  21* 24  GLUCOSE 169*   < > 251*   < > 117* 177* 138* 274* 156*  BUN 58*   < > 62*   < > 28* 76* 75* 72* 71*  CREATININE 2.48*   < > 2.43*   < > 1.04 2.81* 2.84* 2.87* 2.73*  CALCIUM 8.9   < > 8.9   < > 8.1* 8.9 9.1 8.2* 8.4*  MG 2.5*  --  2.4  --   --   --   --  2.3  --   AST  --   --  21  --   --   --   --   --   --   ALT  --   --  29  --   --   --   --   --   --   ALKPHOS  --   --  54  --   --   --   --   --   --   BILITOT  --   --  0.8  --   --   --   --   --   --    < > = values in this interval not displayed.   ------------------------------------------------------------------------------------------------------------------ No results for input(s): CHOL, HDL, LDLCALC, TRIG, CHOLHDL, LDLDIRECT in the last  72 hours.  Lab Results  Component Value Date   HGBA1C >15.5 (H) 02/22/2020   ------------------------------------------------------------------------------------------------------------------ No results for input(s): TSH, T4TOTAL, T3FREE, THYROIDAB in the last 72 hours.  Invalid input(s): FREET3 ------------------------------------------------------------------------------------------------------------------ No results for input(s): VITAMINB12, FOLATE, FERRITIN, TIBC, IRON, RETICCTPCT in the last 72 hours.  Coagulation  profile No results for input(s): INR, PROTIME in the last 168 hours.  No results for input(s): DDIMER in the last 72 hours.  Cardiac Enzymes No results for input(s): CKMB, TROPONINI, MYOGLOBIN in the last 168 hours.  Invalid input(s): CK ------------------------------------------------------------------------------------------------------------------ No results found for: BNP  Micro Results Recent Results (from the past 240 hour(s))  Urine culture     Status: Abnormal   Collection Time: 02/22/20 12:05 PM   Specimen: In/Out Cath Urine  Result Value Ref Range Status   Specimen Description   Final    IN/OUT CATH URINE Performed at [facility], [address] [place], [place] [address]    Special Requests   Final    NONE Performed at [facility], [address], [place], [place] [address]    Culture 20,000 COLONIES/mL PSEUDOMONAS AERUGINOSA (A)  Final   Report Status 02/25/2020 FINAL  Final   Organism ID, Bacteria PSEUDOMONAS AERUGINOSA (A)  Final      Susceptibility   Pseudomonas aeruginosa - MIC*    CEFTAZIDIME 4 SENSITIVE Sensitive     CIPROFLOXACIN <=0.25 SENSITIVE Sensitive     GENTAMICIN <=1 SENSITIVE Sensitive     IMIPENEM 2 SENSITIVE Sensitive     PIP/TAZO 8 SENSITIVE Sensitive     CEFEPIME 2 SENSITIVE Sensitive     * 20,000 COLONIES/mL PSEUDOMONAS AERUGINOSA  Respiratory Panel by RT PCR (Flu A&B, Covid) - Nasopharyngeal Swab     Status: None   Collection Time: 02/22/20 12:10 PM   Specimen: Nasopharyngeal Swab  Result Value Ref Range Status   SARS Coronavirus 2 by RT PCR NEGATIVE NEGATIVE Final    Comment: (NOTE) SARS-CoV-2 target nucleic acids are NOT DETECTED.  The SARS-CoV-2 RNA is generally detectable in upper respiratoy specimens during the acute phase of infection. The lowest concentration of SARS-CoV-2 viral copies this assay can detect is 131 copies/mL. A negative result does not preclude SARS-Cov-2 infection and should not be used as the sole basis for  treatment or other patient management decisions. A negative result may occur with  improper specimen collection/handling, submission of specimen other than nasopharyngeal swab, presence of viral mutation(s) within the areas targeted by this assay, and inadequate number of viral copies (<131 copies/mL). A negative result must be combined with clinical observations, patient history, and epidemiological information. The expected result is Negative.  Fact Sheet for Patients:  PinkCheek.be  Fact Sheet for Healthcare Providers:  GravelBags.it  This test is no t yet approved or cleared by the [place] FDA and  has been authorized for detection and/or diagnosis of SARS-CoV-2 by FDA under an Emergency Use Authorization (EUA). This EUA will remain  in effect (meaning this test can be used) for the duration of the COVID-19 declaration under Section 564(b)(1) of the Act, 21 U.S.C. section 360bbb-3(b)(1), unless the authorization is terminated or revoked sooner.     Influenza A by PCR NEGATIVE NEGATIVE Final   Influenza B by PCR NEGATIVE NEGATIVE Final    Comment: (NOTE) The Xpert Xpress SARS-CoV-2/FLU/RSV assay is intended as an aid in  the diagnosis of influenza from Nasopharyngeal swab specimens and  should not be used as a sole basis for treatment. Nasal washings  and  aspirates are unacceptable for Xpert Xpress SARS-CoV-2/FLU/RSV  testing.  Fact Sheet for Patients: PinkCheek.be  Fact Sheet for Healthcare Providers: GravelBags.it  This test is not yet approved or cleared by the [place] FDA and  has been authorized for detection and/or diagnosis of SARS-CoV-2 by  FDA under an Emergency Use Authorization (EUA). This EUA will remain  in effect (meaning this test can be used) for the duration of the  Covid-19 declaration under Section 564(b)(1) of the Act, 21    U.S.C. section 360bbb-3(b)(1), unless the authorization is  terminated or revoked. Performed at [facility], [address], [place], [place] [address]   Blood Culture (routine x 2)     Status: Abnormal   Collection Time: 02/22/20 12:20 PM   Specimen: BLOOD LEFT HAND  Result Value Ref Range Status   Specimen Description   Final    BLOOD LEFT HAND BOTTLES DRAWN AEROBIC AND ANAEROBIC Performed at [facility], [address] [place], [place] [address]    Special Requests   Final    Blood Culture adequate volume Performed at [facility], [address], [place], [place] [address]    Culture  Setup Time   Final    GRAM NEGATIVE RODS Gram Stain Report Called to,Read Back By and Verified With: DANIELS,J @ 0086 ON 02/23/20 BY JUW AEROBIC BOTTLE ONLY GS DONE @ [facility] Performed at [facility], [address] [place], [place] [address]    Culture (A)  Final    PSEUDOMONAS AERUGINOSA SUSCEPTIBILITIES PERFORMED ON PREVIOUS CULTURE WITHIN THE LAST 5 DAYS. Performed at [facility], [place] [address], [place], [place] [address]    Report Status 02/25/2020 FINAL  Final  Blood Culture (routine x 2)     Status: Abnormal   Collection Time: 02/22/20 12:20 PM   Specimen: Right Antecubital; Blood  Result Value Ref Range Status   Specimen Description   Final    RIGHT ANTECUBITAL BOTTLES DRAWN AEROBIC AND ANAEROBIC Performed at [facility], [address] [place], [place] [address]    Special Requests   Final    Blood Culture adequate volume Performed at [facility], [address] [place], [place] [address]    Culture  Setup Time   Final    GRAM NEGATIVE RODS Gram Stain Report Called to,Read Back By and Verified With: DANIELS,J @ 3382 ON 02/23/20 BY JUW AEROBIC BOTTLE ONLY GS DONE @ [facility]    Culture PSEUDOMONAS AERUGINOSA (A)  Final   Report Status 02/25/2020 FINAL  Final   Organism ID, Bacteria PSEUDOMONAS AERUGINOSA  Final      Susceptibility   Pseudomonas aeruginosa - MIC*     CEFTAZIDIME 4 SENSITIVE Sensitive     CIPROFLOXACIN <=0.25 SENSITIVE Sensitive     GENTAMICIN <=1 SENSITIVE Sensitive     IMIPENEM 2 SENSITIVE Sensitive     PIP/TAZO 8 SENSITIVE Sensitive     CEFEPIME 2 SENSITIVE Sensitive     * PSEUDOMONAS AERUGINOSA  Blood Culture ID Panel (Reflexed)     Status: Abnormal   Collection Time: 02/22/20 12:20 PM  Result Value Ref Range Status   Enterococcus faecalis NOT DETECTED NOT DETECTED Final   Enterococcus Faecium NOT DETECTED NOT DETECTED Final   Listeria monocytogenes NOT DETECTED NOT DETECTED Final   Staphylococcus species NOT DETECTED NOT DETECTED Final   Staphylococcus aureus (BCID) NOT DETECTED NOT DETECTED Final   Staphylococcus epidermidis NOT DETECTED NOT DETECTED Final   Staphylococcus lugdunensis NOT DETECTED NOT DETECTED Final  Streptococcus species NOT DETECTED NOT DETECTED Final   Streptococcus agalactiae NOT DETECTED NOT DETECTED Final   Streptococcus pneumoniae NOT DETECTED NOT DETECTED Final   Streptococcus pyogenes NOT DETECTED NOT DETECTED Final   A.calcoaceticus-baumannii NOT DETECTED NOT DETECTED Final   Bacteroides fragilis NOT DETECTED NOT DETECTED Final   Enterobacterales NOT DETECTED NOT DETECTED Final   Enterobacter cloacae complex NOT DETECTED NOT DETECTED Final   Escherichia coli NOT DETECTED NOT DETECTED Final   Klebsiella aerogenes NOT DETECTED NOT DETECTED Final   Klebsiella oxytoca NOT DETECTED NOT DETECTED Final   Klebsiella pneumoniae NOT DETECTED NOT DETECTED Final   Proteus species NOT DETECTED NOT DETECTED Final   Salmonella species NOT DETECTED NOT DETECTED Final   Serratia marcescens NOT DETECTED NOT DETECTED Final   Haemophilus influenzae NOT DETECTED NOT DETECTED Final   Neisseria meningitidis NOT DETECTED NOT DETECTED Final   Pseudomonas aeruginosa DETECTED (A) NOT DETECTED Final    Comment: CRITICAL RESULT CALLED TO, READ BACK BY AND VERIFIED WITH: Hyman Hopes RN, AT 1245 02/23/20 BY D. VANHOOK     Stenotrophomonas maltophilia NOT DETECTED NOT DETECTED Final   Candida albicans NOT DETECTED NOT DETECTED Final   Candida auris NOT DETECTED NOT DETECTED Final   Candida glabrata NOT DETECTED NOT DETECTED Final   Candida krusei NOT DETECTED NOT DETECTED Final   Candida parapsilosis NOT DETECTED NOT DETECTED Final   Candida tropicalis NOT DETECTED NOT DETECTED Final   Cryptococcus neoformans/gattii NOT DETECTED NOT DETECTED Final   CTX-M ESBL NOT DETECTED NOT DETECTED Final   Carbapenem resistance IMP NOT DETECTED NOT DETECTED Final   Carbapenem resistance KPC NOT DETECTED NOT DETECTED Final   Carbapenem resistance NDM NOT DETECTED NOT DETECTED Final   Carbapenem resistance VIM NOT DETECTED NOT DETECTED Final    Comment: Performed at [facility], [address], [place], [place] [address]  MRSA PCR Screening     Status: None   Collection Time: 02/22/20 12:28 PM   Specimen: Nasopharyngeal  Result Value Ref Range Status   MRSA by PCR NEGATIVE NEGATIVE Final    Comment:        The GeneXpert MRSA Assay (FDA approved for NASAL specimens only), is one component of a comprehensive MRSA colonization surveillance program. It is not intended to diagnose MRSA infection nor to guide or monitor treatment for MRSA infections. Performed at [facility], [address], [place], [place] [address]   Culture, blood (routine x 2)     Status: None (Preliminary result)   Collection Time: 02/27/20  9:53 AM   Specimen: BLOOD RIGHT WRIST  Result Value Ref Range Status   Specimen Description BLOOD RIGHT WRIST  Final   Special Requests   Final    BOTTLES DRAWN AEROBIC AND ANAEROBIC Blood Culture adequate volume   Culture   Final    NO GROWTH 4 DAYS Performed at [facility], [address] [place], [place] [address]    Report Status PENDING  Incomplete  Culture, blood (routine x 2)     Status: None (Preliminary result)   Collection Time: 02/27/20  9:54 AM   Specimen: BLOOD RIGHT FOREARM    Result Value Ref Range Status   Specimen Description BLOOD RIGHT FOREARM  Final   Special Requests   Final    BOTTLES DRAWN AEROBIC AND ANAEROBIC Blood Culture adequate volume   Culture   Final    NO GROWTH 4 DAYS Performed at [facility], [address] [place], [place] [address]  Report Status PENDING  Incomplete    Radiology Reports DG Thoracic Spine W/Swimmers  Result Date: 02/14/2020 CLINICAL DATA:  Bilateral leg swelling.  No reported back symptoms EXAM: THORACIC SPINE - 3 VIEWS COMPARISON:  None. FINDINGS: Diffuse bridging osteophytes from spondylosis. Generalized disc narrowing. No evidence of fracture, endplate erosion, or focal bone lesion. Maintained posterior mediastinal fat planes when allowing for rotation. IMPRESSION: 1. Generalized spondylosis with multi-level bridging. 2. No acute finding. Electronically Signed   By: Monte Fantasia M.D.   On: 02/14/2020 08:12   DG Lumbar Spine Complete  Result Date: 02/14/2020 CLINICAL DATA:  Leg swelling.  No known injury.  Dementia. EXAM: LUMBAR SPINE - COMPLETE 4+ VIEW COMPARISON:  None. FINDINGS: Diffuse disc narrowing and bulky endplate spurring. Diffuse facet degenerative spurring. Multilevel ankylosis is likely present. No evidence of fracture, erosion or bone lesion. IMPRESSION: 1. No acute finding. 2. Advanced lumbar spine degeneration with multiple bridging osteophytes. Electronically Signed   By: Monte Fantasia M.D.   On: 02/14/2020 08:11   DG Pelvis 1-2 Views  Result Date: 02/14/2020 CLINICAL DATA:  Bilateral leg swelling with no known injury or back pain EXAM: PELVIS - 1-2 VIEW COMPARISON:  August 25, 2017 FINDINGS: Bilateral hip degenerative changes. No signs of acute fracture or bone abnormality. Degenerative changes also noted incidentally in the lower lumbar spine. IMPRESSION: Bilateral hip degenerative changes. No signs of acute fracture. Electronically Signed   By: Zetta Bills M.D.   On: 02/14/2020 08:07   DG Ankle  Complete Right  Result Date: 02/14/2020 CLINICAL DATA:  Bilateral foot pain and swelling. EXAM: RIGHT ANKLE - COMPLETE 3+ VIEW COMPARISON:  None. FINDINGS: Nonspecific soft tissue swelling. Profound osteopenia. No acute fracture or subluxation. Bilateral malleolar spurring from enthesophytes. IMPRESSION: 1. Soft tissue swelling without acute osseous finding. 2. Marked osteopenia. Electronically Signed   By: Monte Fantasia M.D.   On: 02/14/2020 07:17   DG Abd 1 View  Result Date: 03/01/2020 CLINICAL DATA:  Abdominal distension EXAM: ABDOMEN - 1 VIEW COMPARISON:  01/04/2006 FINDINGS: Scattered large and small bowel gas is noted. Contrast material is noted in the colon from a prior modified barium swallow. No free air is seen. No obstructive changes are noted. Degenerative changes of lumbar spine are seen. IMPRESSION: No acute abnormality noted. Electronically Signed   By: Inez Catalina M.D.   On: 03/01/2020 15:51   DG CHEST PORT 1 VIEW  Result Date: 02/26/2020 CLINICAL DATA:  Possible aspiration EXAM: PORTABLE CHEST 1 VIEW COMPARISON:  02/22/2020 FINDINGS: Cardiac shadow is stable. Aortic calcifications are again seen. The lungs are well aerated bilaterally. No focal infiltrate or sizable effusion is seen. No bony abnormality is noted. IMPRESSION: No acute abnormality seen.  No change from the prior exam. Electronically Signed   By: Inez Catalina M.D.   On: 02/26/2020 17:41   DG Chest Port 1 View  Result Date: 02/22/2020 CLINICAL DATA:  Sepsis EXAM: PORTABLE CHEST 1 VIEW COMPARISON:  February 14, 2020 FINDINGS: The heart size and mediastinal contours are within normal limits. Both lungs are clear. The visualized skeletal structures are unremarkable. IMPRESSION: No active disease. Electronically Signed   By: Abelardo Diesel M.D.   On: 02/22/2020 14:08   DG Chest Portable 1 View  Result Date: 02/14/2020 CLINICAL DATA:  Bilateral foot pain EXAM: PORTABLE CHEST 1 VIEW COMPARISON:  08/25/2017 FINDINGS:  Normal heart size and stable aortic tortuosity. There is no edema, consolidation, effusion, or pneumothorax. Artifact from EKG leads. Degenerative endplate spurring.  IMPRESSION: No evidence of acute disease. Electronically Signed   By: Monte Fantasia M.D.   On: 02/14/2020 07:06   DG Swallowing Func-Speech Pathology  Result Date: 02/26/2020 Objective Swallowing Evaluation: Type of Study: MBS-Modified Barium Swallow Study  Patient Details Name: DORRANCE SELLICK MRN: [id number] Date of Birth: 01-May-1928 Today's Date: 02/26/2020 Time: SLP Start Time (ACUTE ONLY): 1339 -SLP Stop Time (ACUTE ONLY): 1408 SLP Time Calculation (min) (ACUTE ONLY): 29 min Past Medical History: Past Medical History: Diagnosis Date . Dementia ([employer])  . Diabetes mellitus without complication ([place])  . Encephalopathy  . Gastritis 2004 . GIB (gastrointestinal bleeding) 2004 . Hypertension  . Poor historian  . Sinus bradycardia seen on cardiac monitor  Past Surgical History: Past Surgical History: Procedure Laterality Date . INGUINAL HERNIA REPAIR  09/2010  Bilateral . UPPER GASTROINTESTINAL ENDOSCOPY  2004 HPI: This 86 y.o.malewith medical history significant ofDementia,DiabetesMellitus II, Hypertension, CKD stage IV, premature atrial contractions,history of GI bleed, presents in the emergency department with confusion and fever. This is patient's third ER visit In last three weeks.Last week he presented in the ED with generalized weakness and pain,found to have a UTI . He was discharged on Keflex. Daughter reportshe has been having worsening pain and weakness, was not able to stand up in the morning. Patient is admitted for sepsis secondary to Pseudomonas bacteremia.  He was also found to have high anion gap metabolic acidosis secondary to DKA which has been resolved. He is transitioned to subcu insulin. BSE requested.  Subjective: "I want water!" Assessment / Plan / Recommendation CHL IP CLINICAL IMPRESSIONS 02/26/2020 Clinical  Impression Pt presents with mild oropharyngeal sensorimotor dysphagia characterized by decreased laryngeal vestibule closure resulting in two episodes of trace silent aspiration of thin liquids via straw and consistent penetration of thin liquids and NTL during the swallow that is usually expelled with repeat swallow of solid textures. Decreased pharyngeal constriction resulting in inconsistent clearance of penetrates however despite remaining in the airway, they were only visualized dropping to and below the cords twice as mentioned above. Solid textures were consumed without incident and the barium tablet was swallowed with thin liquids with noted brief stasis in the pyriforms but quickly passed through. RN was present for MBS and BP and HR were stable throughout procedure. Recommend D3/mech soft and Nectar thick liquids; recommend meds whole or crushed in puree as Pt is able. Also recommend free water protocol, * single ice chips after oral care in between meals *. ST will continue to follow acutely. SLP Visit Diagnosis Dysphagia, unspecified (R13.10) Attention and concentration deficit following -- Frontal lobe and executive function deficit following -- Impact on safety and function Mild aspiration risk;Moderate aspiration risk   CHL IP TREATMENT RECOMMENDATION 02/26/2020 Treatment Recommendations Therapy as outlined in treatment plan below   Prognosis 02/26/2020 Prognosis for Safe Diet Advancement Fair Barriers to Reach Goals Cognitive deficits Barriers/Prognosis Comment -- CHL IP DIET RECOMMENDATION 02/26/2020 SLP Diet Recommendations Dysphagia 3 (Mech soft) solids;Nectar thick liquid Liquid Administration via Cup;No straw Medication Administration Whole meds with puree Compensations Slow rate;Small sips/bites Postural Changes Remain semi-upright after after feeds/meals (Comment);Seated upright at 90 degrees   CHL IP OTHER RECOMMENDATIONS 02/26/2020 Recommended Consults -- Oral Care Recommendations Oral care  BID Other Recommendations Order thickener from pharmacy   CHL IP FOLLOW UP RECOMMENDATIONS 02/26/2020 Follow up Recommendations 24 hour supervision/assistance   CHL IP FREQUENCY AND DURATION 02/26/2020 Speech Therapy Frequency (ACUTE ONLY) min 2x/week Treatment Duration 1 week      CHL  IP ORAL PHASE 02/26/2020 Oral Phase WFL Oral - Pudding Teaspoon -- Oral - Pudding Cup -- Oral - Honey Teaspoon -- Oral - Honey Cup -- Oral - Nectar Teaspoon -- Oral - Nectar Cup -- Oral - Nectar Straw -- Oral - Thin Teaspoon -- Oral - Thin Cup -- Oral - Thin Straw -- Oral - Puree -- Oral - Mech Soft -- Oral - Regular -- Oral - Multi-Consistency -- Oral - Pill -- Oral Phase - Comment --  CHL IP PHARYNGEAL PHASE 02/26/2020 Pharyngeal Phase Impaired Pharyngeal- Pudding Teaspoon -- Pharyngeal -- Pharyngeal- Pudding Cup -- Pharyngeal -- Pharyngeal- Honey Teaspoon Pharyngeal residue - valleculae;Pharyngeal residue - pyriform;Reduced pharyngeal peristalsis Pharyngeal -- Pharyngeal- Honey Cup -- Pharyngeal -- Pharyngeal- Nectar Teaspoon Reduced pharyngeal peristalsis;Reduced airway/laryngeal closure;Penetration/Aspiration during swallow;Pharyngeal residue - valleculae;Pharyngeal residue - pyriform Pharyngeal Material enters airway, remains ABOVE vocal cords then ejected out Pharyngeal- Nectar Cup Reduced pharyngeal peristalsis;Reduced airway/laryngeal closure;Penetration/Aspiration during swallow;Pharyngeal residue - valleculae;Pharyngeal residue - pyriform Pharyngeal Material enters airway, remains ABOVE vocal cords then ejected out Pharyngeal- Nectar Straw -- Pharyngeal -- Pharyngeal- Thin Teaspoon Delayed swallow initiation-pyriform sinuses;Reduced pharyngeal peristalsis;Reduced epiglottic inversion;Reduced anterior laryngeal mobility;Reduced laryngeal elevation;Reduced airway/laryngeal closure;Reduced tongue base retraction;Penetration/Aspiration during swallow;Pharyngeal residue - valleculae;Pharyngeal residue - pyriform Pharyngeal  Material enters airway, remains ABOVE vocal cords and not ejected out Pharyngeal- Thin Cup Delayed swallow initiation-pyriform sinuses;Reduced pharyngeal peristalsis;Reduced epiglottic inversion;Reduced anterior laryngeal mobility;Reduced laryngeal elevation;Reduced airway/laryngeal closure;Reduced tongue base retraction;Penetration/Aspiration during swallow;Pharyngeal residue - valleculae;Pharyngeal residue - pyriform Pharyngeal Material enters airway, remains ABOVE vocal cords and not ejected out Pharyngeal- Thin Straw Delayed swallow initiation-pyriform sinuses;Reduced pharyngeal peristalsis;Reduced epiglottic inversion;Reduced anterior laryngeal mobility;Reduced laryngeal elevation;Reduced airway/laryngeal closure;Reduced tongue base retraction;Penetration/Aspiration during swallow;Pharyngeal residue - valleculae;Pharyngeal residue - pyriform Pharyngeal Material enters airway, passes BELOW cords without attempt by patient to eject out (silent aspiration) Pharyngeal- Puree WFL Pharyngeal -- Pharyngeal- Mechanical Soft -- Pharyngeal -- Pharyngeal- Regular WFL Pharyngeal -- Pharyngeal- Multi-consistency -- Pharyngeal -- Pharyngeal- Pill Pharyngeal residue - cp segment;Pharyngeal residue - pyriform Pharyngeal -- Pharyngeal Comment --  CHL IP CERVICAL ESOPHAGEAL PHASE 02/26/2020 Cervical Esophageal Phase WFL Pudding Teaspoon -- Pudding Cup -- Honey Teaspoon -- Honey Cup -- Nectar Teaspoon -- Nectar Cup -- Nectar Straw -- Thin Teaspoon -- Thin Cup -- Thin Straw -- Puree -- Mechanical Soft -- Regular -- Multi-consistency -- Pill -- Cervical Esophageal Comment -- Amelia H. Roddie Mc, CCC-SLP Speech Language Pathologist Wende Bushy 02/26/2020, 4:49 PM              US Abdomen Limited RUQ  Result Date: 02/23/2020 CLINICAL DATA:  Elevated liver enzymes EXAM: ULTRASOUND ABDOMEN LIMITED RIGHT UPPER QUADRANT COMPARISON:  August 25, 2017 FINDINGS: Gallbladder: No wall thickening visualized. Gallstones are  identified. 0.3 cm polyp is identified. No sonographic Murphy sign noted by sonographer. Common bile duct: Diameter: 0.3 mm Liver: 1.5 x 1.1 x 1.4 cm cyst is identified in the left lobe liver. Within normal limits in parenchymal echogenicity. Portal vein is patent on color Doppler imaging with normal direction of blood flow towards the liver. Other: None. IMPRESSION: 1. Cholelithiasis without sonographic evidence of acute cholecystitis. 2. 1.5 cm cyst in the left lobe liver. Electronically Signed   By: Abelardo Diesel M.D.   On: 02/23/2020 14:49     Time Spent in minutes  30     Desiree Hane M.D on 03/02/2020 at 2:11 PM  To page go to [email or web address] - password [facility]

## 2020-03-03 DIAGNOSIS — R404 Transient alteration of awareness: Secondary | ICD-10-CM | POA: Diagnosis not present

## 2020-03-03 DIAGNOSIS — I480 Paroxysmal atrial fibrillation: Secondary | ICD-10-CM | POA: Diagnosis not present

## 2020-03-03 DIAGNOSIS — E870 Hyperosmolality and hypernatremia: Secondary | ICD-10-CM | POA: Diagnosis not present

## 2020-03-03 DIAGNOSIS — F0390 Unspecified dementia without behavioral disturbance: Secondary | ICD-10-CM | POA: Diagnosis not present

## 2020-03-03 LAB — CBC
HCT: 24.4 % — ABNORMAL LOW (ref 39.0–52.0)
Hemoglobin: 7.4 g/dL — ABNORMAL LOW (ref 13.0–17.0)
MCH: 27.6 pg (ref 26.0–34.0)
MCHC: 30.3 g/dL (ref 30.0–36.0)
MCV: 91 fL (ref 80.0–100.0)
Platelets: 316 10*3/uL (ref 150–400)
RBC: 2.68 MIL/uL — ABNORMAL LOW (ref 4.22–5.81)
RDW: 15.2 % (ref 11.5–15.5)
WBC: 9.5 10*3/uL (ref 4.0–10.5)
nRBC: 0 % (ref 0.0–0.2)

## 2020-03-03 LAB — BASIC METABOLIC PANEL
Anion gap: 11 (ref 5–15)
Anion gap: 10 (ref 5–15)
BUN: 63 mg/dL — ABNORMAL HIGH (ref 8–23)
BUN: 63 mg/dL — ABNORMAL HIGH (ref 8–23)
CO2: 22 mmol/L (ref 22–32)
CO2: 22 mmol/L (ref 22–32)
Calcium: 7.8 mg/dL — ABNORMAL LOW (ref 8.9–10.3)
Calcium: 8 mg/dL — ABNORMAL LOW (ref 8.9–10.3)
Chloride: 113 mmol/L — ABNORMAL HIGH (ref 98–111)
Chloride: 111 mmol/L (ref 98–111)
Creatinine, Ser: 2.64 mg/dL — ABNORMAL HIGH (ref 0.61–1.24)
Creatinine, Ser: 2.82 mg/dL — ABNORMAL HIGH (ref 0.61–1.24)
GFR, Estimated: 20 mL/min — ABNORMAL LOW (ref >60)
GFR, Estimated: 19 mL/min — ABNORMAL LOW (ref >60)
Glucose, Bld: 186 mg/dL — ABNORMAL HIGH (ref 70–99)
Glucose, Bld: 402 mg/dL — ABNORMAL HIGH (ref 70–99)
Potassium: 3.8 mmol/L (ref 3.5–5.1)
Potassium: 4.2 mmol/L (ref 3.5–5.1)
Sodium: 146 mmol/L — ABNORMAL HIGH (ref 135–145)
Sodium: 143 mmol/L (ref 135–145)

## 2020-03-03 LAB — CULTURE, BLOOD (ROUTINE X 2)
Culture: NO GROWTH
Culture: NO GROWTH
Special Requests: ADEQUATE
Special Requests: ADEQUATE

## 2020-03-03 LAB — GLUCOSE, CAPILLARY
Glucose-Capillary: 164 mg/dL — ABNORMAL HIGH (ref 70–99)
Glucose-Capillary: 339 mg/dL — ABNORMAL HIGH (ref 70–99)
Glucose-Capillary: 83 mg/dL (ref 70–99)

## 2020-03-03 MED ORDER — DEXTROSE 5 % IV SOLN: INTRAVENOUS | Status: AC

## 2020-03-03 NOTE — Progress Notes (Signed)
[facility]  PROGRESS NOTE  Paul Perez YQM:[id number] DOB: 10/14/1927 DOA: 02/22/2020 PCP: Alanson Puls [facility] date - 02/22/2020   Admitting Physician Shawna Clamp, MD  Outpatient Primary MD for the patient is [facility]  LOS - 10 Brief Narrative   Paul Perez is a 86 y.o. year old male with medical history significant for Be[place]ies, dementia, HTN, CKDStage IV, PACs who presented on 2/9 with confusion and fever after recently being treated for UTI with Keflex and was found to have severe sepsis secondary to Pseudomonas bacteremia.  Hospital course complicated by DKA, paroxysmal atrial fibrillation, oropharyngeal dysphagia.  Subjective  No acute events overnight.  Has mittens on.   A & P  Mild oropharyngeal dysphagia.  Underwent barium swallow on 10/13.  Chest x-ray on 10/13 unremarkable -Speech recommends dysphagia 3 diet, closely monitor. -Aspiration precautions  Paroxysmal atrial fibrillation with RVR, currently in normal sinus rhythm and rate controlled.  Occurred in setting of potential aspiration event. -Appreciate cardiology recommendations, continue oral Lopressor 25 mg twice daily -Not a candidate for anticoagulation given dementia, and prior history of GI bleed   Hypertension, not at goal, but much improved.  SBP in the 150s -Continue current dose of hydralazine 75 mg 3 times daily, tolerating Lopressor 25 mg twice daily, clonidine patch -Continue to monitor amlodipine may be necessary if remains elevated -Clonidine patch, Lasix  Severe sepsis secondary to pseudomonal bacteremia, resolved .  currently afebrile -Completed 7-day course of IV cefepime, end date 10/17 -Repeat blood cultures unremarkable  DKA, resolved Type 2 diabetes, poorly controlled A1c greater than 15.  CBGs range 140s-330s in setting of being on D5 for hypernatremia - Levemir 20 U, NovoLog to 5 units 3 times daily as long as eating greater than 50% of  meals -Continue to monitor CBGs -Sliding scale as needed with meals  Mood disorder, stable -BuSpar  Hypernatremia,recurrent.  Improved from 152 to 147 with D5.  Suspect decreased oral intake in the setting of baseline dementia.  This is likely a poor prognosticating factor given this tends to indicate patient does not seem to have any thirst/hunger cues. - D5 over 12 hours, repeat BMP this afternoon (143) -Palliative care to assist with goals of care discussions with family -Monitor BMP daily  Elevated liver enzymes, resolved.  Right upper quad ultrasound showed cholelithiasis.  CKD, stage IV, creatinine stable at baseline.  Did have peak creatinine of 3.58 during hospitalization for which nephrology recommended continue supportive care -Avoid nephrotoxic agents  Acute encephalopathy, multifactorial (infectious, metabolic), waxes and wanes.  Alert to self this morning, intermittently able to tell me his name and that he is in no pain.  In setting of dementia for which baseline is able to have conversation per daughter.  Likely further complicated by hospital-acquired delirium -Delirium precautions -Expect improvement with treatment of medical issues -Continue home Aricept -Continue goals of care discussions  Goals of care  there is no healthcare power of attorney  decisions for him to be made jointly amongst siblings I have had conversations with his daughter [name], --currently full code/full scope --Palliative consulted to assist with [place] discussions    Family Communication  : Spoke with daughter [name] at bedside on 10/14, will call on 10/19  Code Status : Full code, discussed patient's desire of advanced directives with [name] who states he has no specific directives.  She has initiated conversations with her brothers regarding goals of care  Disposition Plan  :  Patient is  from ALF. Anticipated d/c date: 2 to 3 days. Barriers to d/c or necessity for inpatient status:  Still  having recurrent hypernatremia in the setting of diminished oral intake requiring D5, highly concerning for end-stage dementia, needs continued goals of care discussion with family,  Consults  : Cardiology  Procedures  : Barium swallow, 10/13  DVT Prophylaxis  :  Lovenox   MDM: The below labs and imaging reports were reviewed and summarized above.  Medication management as above.  Lab Results  Component Value Date   PLT 316 03/03/2020    Diet :  Diet Order            DIET DYS 3 Room service appropriate? Yes; Fluid consistency: Nectar Thick  Diet effective now                  Inpatient Medications Scheduled Meds: . busPIRone  5 mg Oral BID  . Chlorhexidine Gluconate Cloth  6 each Topical Daily  . cloNIDine  0.1 mg Transdermal Weekly  . docusate sodium  100 mg Oral BID  . donepezil  5 mg Oral QHS  . enoxaparin (LOVENOX) injection  30 mg Subcutaneous Daily  . hydrALAZINE  75 mg Oral TID  . insulin aspart  0-9 Units Subcutaneous TID WC  . insulin aspart  5 Units Subcutaneous TID WC  . insulin detemir  20 Units Subcutaneous Daily  . mouth rinse  15 mL Mouth Rinse BID  . metoprolol tartrate  25 mg Oral BID  . polyethylene glycol  17 g Oral Daily  . senna  1 tablet Oral BID  . sodium chloride flush  3 mL Intravenous Q12H   Continuous Infusions: . dextrose 75 mL/hr at 03/03/20 0922  . lactated ringers     PRN Meds:.acetaminophen **OR** acetaminophen, dextrose, hydrALAZINE, melatonin, ondansetron **OR** ondansetron (ZOFRAN) IV  Antibiotics  :   Anti-infectives (From admission, onward)   Start     Dose/Rate Route Frequency Ordered Stop   02/24/20 1300  vancomycin (VANCOCIN) IVPB 1000 mg/200 mL premix  Status:  Discontinued        1,000 mg 200 mL/hr over 60 Minutes Intravenous Every 48 hours 02/22/20 1331 02/24/20 0839   02/23/20 1500  ceFEPIme (MAXIPIME) 2 g in sodium chloride 0.9 % 100 mL IVPB        2 g 200 mL/hr over 30 Minutes Intravenous Every 24 hours 02/22/20  1457 02/28/20 1731   02/22/20 2000  metroNIDAZOLE (FLAGYL) IVPB 500 mg  Status:  Discontinued        500 mg 100 mL/hr over 60 Minutes Intravenous Every 8 hours 02/22/20 1549 02/24/20 0839   02/22/20 1300  vancomycin (VANCOREADY) IVPB 2000 mg/400 mL        2,000 mg 200 mL/hr over 120 Minutes Intravenous  Once 02/22/20 1219 02/22/20 1545   02/22/20 1215  ceFEPIme (MAXIPIME) 2 g in sodium chloride 0.9 % 100 mL IVPB        2 g 200 mL/hr over 30 Minutes Intravenous  Once 02/22/20 1207 02/22/20 1406   02/22/20 1215  metroNIDAZOLE (FLAGYL) IVPB 500 mg        500 mg 100 mL/hr over 60 Minutes Intravenous  Once 02/22/20 1207 02/22/20 1406   02/22/20 1215  vancomycin (VANCOCIN) IVPB 1000 mg/200 mL premix  Status:  Discontinued        1,000 mg 200 mL/hr over 60 Minutes Intravenous  Once 02/22/20 1207 02/22/20 1219       Objective   Vitals:  03/01/20 2137 03/02/20 1400 03/02/20 2136 03/03/20 0557  BP: (!) 151/79 (!) 163/84 (!) 156/64 (!) 154/66  Pulse: 79 64 65 60  Resp: 20 18 20 20   Temp: 98.3 F (36.8 C) 98.2 F (36.8 C) 98.1 F (36.7 C) 99.4 F (37.4 C)  TempSrc: Oral Oral Oral Oral  SpO2: 97% 100% 97% 100%  Weight:      Height:        SpO2: 100 % O2 Flow Rate (L/min): 2 L/min  Wt Readings from Last 3 Encounters:  02/28/20 83.1 kg  02/17/20 90.7 kg  02/14/20 87.1 kg     Intake/Output Summary (Last 24 hours) at 03/03/2020 1503 Last data filed at 03/03/2020 0839 Gross per 24 hour  Intake 720 ml  Output 600 ml  Net 120 ml    Physical Exam:  Lying in bed comfortably, hand mittens in place Alert, only oriented to self, denies any pain, able to follow commands intermittently, confused, distractible No new F.N deficits,  Swelling in upper extremities (Right arm > Left)with scattered bruising [place].AT, Dry oral mucosa Normal respiratory effort on room air RRR, SEM present, no peripheral edema noted   Abdomen soft, non-distended  I have personally reviewed the  following:   Data Reviewed:  CBC Recent Labs  Lab 02/28/20 0542 02/29/20 0708 03/01/20 0622 03/02/20 0609 03/03/20 0630  WBC 10.7* 11.4* 9.8 9.8 9.5  HGB 8.1* 8.4* 8.0* 7.8* 7.4*  HCT 26.0* 28.2* 27.2* 25.4* 24.4*  PLT 216 267 271 322 316  MCV 86.7 89.0 92.5 88.2 91.0  MCH 27.0 26.5 27.2 27.1 27.6  MCHC 31.2 29.8* 29.4* 30.7 30.3  RDW 14.6 14.7 15.1 14.9 15.2    Chemistries  Recent Labs  Lab 02/26/20 0334 02/27/20 0953 03/01/20 0622 03/02/20 0609 03/02/20 1438 03/03/20 0630 03/03/20 1352  NA 145   < > 147* 152* 149* 146* 143  K 3.7   < > 3.5 3.4* 3.3* 3.8 4.2  CL 113*   < > 116* 115* 114* 113* 111  CO2 22   < > 21* 24 24 22 22   GLUCOSE 251*   < > 274* 156* 147* 186* 402*  BUN 62*   < > 72* 71* 68* 63* 63*  CREATININE 2.43*   < > 2.87* 2.73* 2.70* 2.64* 2.82*  CALCIUM 8.9   < > 8.2* 8.4* 8.2* 7.8* 8.0*  MG 2.4  --  2.3  --  2.5*  --   --   AST 21  --   --   --   --   --   --   ALT 29  --   --   --   --   --   --   ALKPHOS 54  --   --   --   --   --   --   BILITOT 0.8  --   --   --   --   --   --    < > = values in this interval not displayed.   ------------------------------------------------------------------------------------------------------------------ No results for input(s): CHOL, HDL, LDLCALC, TRIG, CHOLHDL, LDLDIRECT in the last 72 hours.  Lab Results  Component Value Date   HGBA1C >15.5 (H) 02/22/2020   ------------------------------------------------------------------------------------------------------------------ No results for input(s): TSH, T4TOTAL, T3FREE, THYROIDAB in the last 72 hours.  Invalid input(s): FREET3 ------------------------------------------------------------------------------------------------------------------ No results for input(s): VITAMINB12, FOLATE, FERRITIN, TIBC, IRON, RETICCTPCT in the last 72 hours.  Coagulation profile No results for input(s): INR, PROTIME in the last 168 hours.  No results for input(s): DDIMER  in the last 72 hours.  Cardiac Enzymes No results for input(s): CKMB, TROPONINI, MYOGLOBIN in the last 168 hours.  Invalid input(s): CK ------------------------------------------------------------------------------------------------------------------ No results found for: BNP  Micro Results Recent Results (from the past 240 hour(s))  Culture, blood (routine x 2)     Status: None   Collection Time: 02/27/20  9:53 AM   Specimen: BLOOD RIGHT WRIST  Result Value Ref Range Status   Specimen Description BLOOD RIGHT WRIST  Final   Special Requests   Final    BOTTLES DRAWN AEROBIC AND ANAEROBIC Blood Culture adequate volume   Culture   Final    NO GROWTH 5 DAYS Performed at [facility], [address] [place], [place] [address]    Report Status 03/03/2020 FINAL  Final  Culture, blood (routine x 2)     Status: None   Collection Time: 02/27/20  9:54 AM   Specimen: BLOOD RIGHT FOREARM  Result Value Ref Range Status   Specimen Description BLOOD RIGHT FOREARM  Final   Special Requests   Final    BOTTLES DRAWN AEROBIC AND ANAEROBIC Blood Culture adequate volume   Culture   Final    NO GROWTH 5 DAYS Performed at [facility], [address], [place], [place] [address]    Report Status 03/03/2020 FINAL  Final    Radiology Reports DG Thoracic Spine W/Swimmers  Result Date: 02/14/2020 CLINICAL DATA:  Bilateral leg swelling.  No reported back symptoms EXAM: THORACIC SPINE - 3 VIEWS COMPARISON:  None. FINDINGS: Diffuse bridging osteophytes from spondylosis. Generalized disc narrowing. No evidence of fracture, endplate erosion, or focal bone lesion. Maintained posterior mediastinal fat planes when allowing for rotation. IMPRESSION: 1. Generalized spondylosis with multi-level bridging. 2. No acute finding. Electronically Signed   By: [place] Fantasia M.D.   On: 02/14/2020 08:12   DG Lumbar Spine Complete  Result Date: 02/14/2020 CLINICAL DATA:  Leg swelling.  No known injury.  Dementia. EXAM:  LUMBAR SPINE - COMPLETE 4+ VIEW COMPARISON:  None. FINDINGS: Diffuse disc narrowing and bulky endplate spurring. Diffuse facet degenerative spurring. Multilevel ankylosis is likely present. No evidence of fracture, erosion or bone lesion. IMPRESSION: 1. No acute finding. 2. Advanced lumbar spine degeneration with multiple bridging osteophytes. Electronically Signed   By: [place] Fantasia M.D.   On: 02/14/2020 08:11   DG Pelvis 1-2 Views  Result Date: 02/14/2020 CLINICAL DATA:  Bilateral leg swelling with no known injury or back pain EXAM: PELVIS - 1-2 VIEW COMPARISON:  August 25, 2017 FINDINGS: Bilateral hip degenerative changes. No signs of acute fracture or bone abnormality. Degenerative changes also noted incidentally in the lower lumbar spine. IMPRESSION: Bilateral hip degenerative changes. No signs of acute fracture. Electronically Signed   By: Zetta Bills M.D.   On: 02/14/2020 08:07   DG Ankle Complete Right  Result Date: 02/14/2020 CLINICAL DATA:  Bilateral foot pain and swelling. EXAM: RIGHT ANKLE - COMPLETE 3+ VIEW COMPARISON:  None. FINDINGS: Nonspecific soft tissue swelling. Profound osteopenia. No acute fracture or subluxation. Bilateral malleolar spurring from enthesophytes. IMPRESSION: 1. Soft tissue swelling without acute osseous finding. 2. Marked osteopenia. Electronically Signed   By: [place] Fantasia M.D.   On: 02/14/2020 07:17   DG Abd 1 View  Result Date: 03/01/2020 CLINICAL DATA:  Abdominal distension EXAM: ABDOMEN - 1 VIEW COMPARISON:  01/04/2006 FINDINGS: Scattered large and small bowel gas is noted. Contrast material is noted in the colon from a prior modified barium swallow. No  free air is seen. No obstructive changes are noted. Degenerative changes of lumbar spine are seen. IMPRESSION: No acute abnormality noted. Electronically Signed   By: Inez Catalina M.D.   On: 03/01/2020 15:51   DG CHEST PORT 1 VIEW  Result Date: 02/26/2020 CLINICAL DATA:  Possible aspiration EXAM:  PORTABLE CHEST 1 VIEW COMPARISON:  02/22/2020 FINDINGS: Cardiac shadow is stable. Aortic calcifications are again seen. The lungs are well aerated bilaterally. No focal infiltrate or sizable effusion is seen. No bony abnormality is noted. IMPRESSION: No acute abnormality seen.  No change from the prior exam. Electronically Signed   By: Inez Catalina M.D.   On: 02/26/2020 17:41   DG Chest Port 1 View  Result Date: 02/22/2020 CLINICAL DATA:  Sepsis EXAM: PORTABLE CHEST 1 VIEW COMPARISON:  February 14, 2020 FINDINGS: The heart size and mediastinal contours are within normal limits. Both lungs are clear. The visualized skeletal structures are unremarkable. IMPRESSION: No active disease. Electronically Signed   By: Abelardo Diesel M.D.   On: 02/22/2020 14:08   DG Chest Portable 1 View  Result Date: 02/14/2020 CLINICAL DATA:  Bilateral foot pain EXAM: PORTABLE CHEST 1 VIEW COMPARISON:  08/25/2017 FINDINGS: Normal heart size and stable aortic tortuosity. There is no edema, consolidation, effusion, or pneumothorax. Artifact from EKG leads. Degenerative endplate spurring. IMPRESSION: No evidence of acute disease. Electronically Signed   By: [place] Fantasia M.D.   On: 02/14/2020 07:06   DG Swallowing Func-Speech Pathology  Result Date: 02/26/2020 Objective Swallowing Evaluation: Type of Study: MBS-Modified Barium Swallow Study  Patient Details Name: RYDELL WIEGEL MRN: [id number] Date of Birth: [birth year] Today's Date: 02/26/2020 Time: SLP Start Time (ACUTE ONLY): 1339 -SLP Stop Time (ACUTE ONLY): 1408 SLP Time Calculation (min) (ACUTE ONLY): 29 min Past Medical History: Past Medical History: Diagnosis Date . Dementia ([place])  . Diabetes mellitus without complication ([place])  . Encephalopathy  . Gastritis 2004 . GIB (gastrointestinal bleeding) 2004 . Hypertension  . Poor historian  . Sinus bradycardia seen on cardiac monitor  Past Surgical History: Past Surgical History: Procedure Laterality Date . INGUINAL HERNIA REPAIR   09/2010  Bilateral . UPPER GASTROINTESTINAL ENDOSCOPY  2004 HPI: This 86 y.o.malewith medical history significant ofDementia,DiabetesMellitus II, Hypertension, CKD stage IV, premature atrial contractions,history of GI bleed, presents in the emergency department with confusion and fever. This is patient's third ER visit In last three weeks.Last week he presented in the ED with generalized weakness and pain,found to have a UTI . He was discharged on Keflex. Daughter reportshe has been having worsening pain and weakness, was not able to stand up in the morning. Patient is admitted for sepsis secondary to Pseudomonas bacteremia.  He was also found to have high anion gap metabolic acidosis secondary to DKA which has been resolved. He is transitioned to subcu insulin. BSE requested.  Subjective: "I want water!" Assessment / Plan / Recommendation CHL IP CLINICAL IMPRESSIONS 02/26/2020 Clinical Impression Pt presents with mild oropharyngeal sensorimotor dysphagia characterized by decreased laryngeal vestibule closure resulting in two episodes of trace silent aspiration of thin liquids via straw and consistent penetration of thin liquids and NTL during the swallow that is usually expelled with repeat swallow of solid textures. Decreased pharyngeal constriction resulting in inconsistent clearance of penetrates however despite remaining in the airway, they were only visualized dropping to and below the cords twice as mentioned above. Solid textures were consumed without incident and the barium tablet was swallowed with thin liquids with noted brief stasis in the  pyriforms but quickly passed through. RN was present for MBS and BP and HR were stable throughout procedure. Recommend D3/mech soft and Nectar thick liquids; recommend meds whole or crushed in puree as Pt is able. Also recommend free water protocol, * single ice chips after oral care in between meals *. ST will continue to follow acutely. SLP Visit  Diagnosis Dysphagia, unspecified (R13.10) Attention and concentration deficit following -- Frontal lobe and executive function deficit following -- Impact on safety and function Mild aspiration risk;Moderate aspiration risk   CHL IP TREATMENT RECOMMENDATION 02/26/2020 Treatment Recommendations Therapy as outlined in treatment plan below   Prognosis 02/26/2020 Prognosis for Safe Diet Advancement Fair Barriers to Reach Goals Cognitive deficits Barriers/Prognosis Comment -- CHL IP DIET RECOMMENDATION 02/26/2020 SLP Diet Recommendations Dysphagia 3 (Mech soft) solids;Nectar thick liquid Liquid Administration via Cup;No straw Medication Administration Whole meds with puree Compensations Slow rate;Small sips/bites Postural Changes Remain semi-upright after after feeds/meals (Comment);Seated upright at 90 degrees   CHL IP OTHER RECOMMENDATIONS 02/26/2020 Recommended Consults -- Oral Care Recommendations Oral care BID Other Recommendations Order thickener from pharmacy   CHL IP FOLLOW UP RECOMMENDATIONS 02/26/2020 Follow up Recommendations 24 hour supervision/assistance   CHL IP FREQUENCY AND DURATION 02/26/2020 Speech Therapy Frequency (ACUTE ONLY) min 2x/week Treatment Duration 1 week      CHL IP ORAL PHASE 02/26/2020 Oral Phase WFL Oral - Pudding Teaspoon -- Oral - Pudding Cup -- Oral - Honey Teaspoon -- Oral - Honey Cup -- Oral - Nectar Teaspoon -- Oral - Nectar Cup -- Oral - Nectar Straw -- Oral - Thin Teaspoon -- Oral - Thin Cup -- Oral - Thin Straw -- Oral - Puree -- Oral - Mech Soft -- Oral - Regular -- Oral - Multi-Consistency -- Oral - Pill -- Oral Phase - Comment --  CHL IP PHARYNGEAL PHASE 02/26/2020 Pharyngeal Phase Impaired Pharyngeal- Pudding Teaspoon -- Pharyngeal -- Pharyngeal- Pudding Cup -- Pharyngeal -- Pharyngeal- Honey Teaspoon Pharyngeal residue - valleculae;Pharyngeal residue - pyriform;Reduced pharyngeal peristalsis Pharyngeal -- Pharyngeal- Honey Cup -- Pharyngeal -- Pharyngeal- Nectar Teaspoon  Reduced pharyngeal peristalsis;Reduced airway/laryngeal closure;Penetration/Aspiration during swallow;Pharyngeal residue - valleculae;Pharyngeal residue - pyriform Pharyngeal Material enters airway, remains ABOVE vocal cords then ejected out Pharyngeal- Nectar Cup Reduced pharyngeal peristalsis;Reduced airway/laryngeal closure;Penetration/Aspiration during swallow;Pharyngeal residue - valleculae;Pharyngeal residue - pyriform Pharyngeal Material enters airway, remains ABOVE vocal cords then ejected out Pharyngeal- Nectar Straw -- Pharyngeal -- Pharyngeal- Thin Teaspoon Delayed swallow initiation-pyriform sinuses;Reduced pharyngeal peristalsis;Reduced epiglottic inversion;Reduced anterior laryngeal mobility;Reduced laryngeal elevation;Reduced airway/laryngeal closure;Reduced tongue base retraction;Penetration/Aspiration during swallow;Pharyngeal residue - valleculae;Pharyngeal residue - pyriform Pharyngeal Material enters airway, remains ABOVE vocal cords and not ejected out Pharyngeal- Thin Cup Delayed swallow initiation-pyriform sinuses;Reduced pharyngeal peristalsis;Reduced epiglottic inversion;Reduced anterior laryngeal mobility;Reduced laryngeal elevation;Reduced airway/laryngeal closure;Reduced tongue base retraction;Penetration/Aspiration during swallow;Pharyngeal residue - valleculae;Pharyngeal residue - pyriform Pharyngeal Material enters airway, remains ABOVE vocal cords and not ejected out Pharyngeal- Thin Straw Delayed swallow initiation-pyriform sinuses;Reduced pharyngeal peristalsis;Reduced epiglottic inversion;Reduced anterior laryngeal mobility;Reduced laryngeal elevation;Reduced airway/laryngeal closure;Reduced tongue base retraction;Penetration/Aspiration during swallow;Pharyngeal residue - valleculae;Pharyngeal residue - pyriform Pharyngeal Material enters airway, passes BELOW cords without attempt by patient to eject out (silent aspiration) Pharyngeal- Puree WFL Pharyngeal -- Pharyngeal- Mechanical  Soft -- Pharyngeal -- Pharyngeal- Regular WFL Pharyngeal -- Pharyngeal- Multi-consistency -- Pharyngeal -- Pharyngeal- Pill Pharyngeal residue - cp segment;Pharyngeal residue - pyriform Pharyngeal -- Pharyngeal Comment --  CHL IP CERVICAL ESOPHAGEAL PHASE 02/26/2020 Cervical Esophageal Phase WFL Pudding Teaspoon -- Pudding Cup -- Honey Teaspoon -- Honey Cup -- [employer]  Teaspoon -- Nectar Cup -- Nectar Straw -- Thin Teaspoon -- Thin Cup -- Thin Straw -- Puree -- Mechanical Soft -- Regular -- Multi-consistency -- Pill -- Cervical Esophageal Comment -- Amelia H. Roddie Mc, CCC-SLP Speech Language Pathologist Wende Bushy 02/26/2020, 4:49 PM              US Abdomen Limited RUQ  Result Date: 02/23/2020 CLINICAL DATA:  Elevated liver enzymes EXAM: ULTRASOUND ABDOMEN LIMITED RIGHT UPPER QUADRANT COMPARISON:  August 25, 2017 FINDINGS: Gallbladder: No wall thickening visualized. Gallstones are identified. 0.3 cm polyp is identified. No sonographic Murphy sign noted by sonographer. Common bile duct: Diameter: 0.3 mm Liver: 1.5 x 1.1 x 1.4 cm cyst is identified in the left lobe liver. Within normal limits in parenchymal echogenicity. Portal vein is patent on color Doppler imaging with normal direction of blood flow towards the liver. Other: None. IMPRESSION: 1. Cholelithiasis without sonographic evidence of acute cholecystitis. 2. 1.5 cm cyst in the left lobe liver. Electronically Signed   By: Abelardo Diesel M.D.   On: 02/23/2020 14:49     Time Spent in minutes  30     Desiree Hane M.D on 03/03/2020 at 3:03 PM  To page go to [email or web address] - password [facility]

## 2020-03-03 NOTE — Progress Notes (Signed)
PMT consult received and chart reviewed. Patient is confused with baseline dementia. Needs family for [place] discussion. VM left for daughter, [name]. Awaiting return call.   NO CHARGE  Ihor Dow, [place], FNP-C Palliative Medicine Team  Phone: [phone] Fax: [phone]

## 2020-03-03 NOTE — Progress Notes (Signed)
Physical Therapy Treatment Patient Details Name: Paul Perez MRN: [id number] DOB: 1928-04-01 Today's Date: 03/03/2020    History of Present Illness Paul Perez is a 86 y.o. male with medical history significant of Dementia, Diabetes Mellitus II,  Hypertension, CKD stage IV, premature atrial contractions , history of GI bleed, presents in the emergency department with confusion and fever. History is obtained from ED chart. Patient is demented,  unable to provide history.  History is also obtained from daughter [name] over the phone.  This is patient's third ER visit  In last three weeks.  Last week he presented in the ED with generalized weakness and pain,  found to have a UTI . He was discharged on Keflex.  Daughter reports he has been having worsening pain and weakness, was not able to stand up in the morning.  He was brought in the emergency department the morning.    PT Comments    Session limited by lethargy. Pt opens eyes to verbal commands and able to appropriately state name, unable to answer additional orientation questions and follows one step commands inconsistently. Pt initially attempts ankle pumps with multimodal cues, progressing to PROM while supine. Facial wincing noted with repositioning pt in supine, requiring increased time to avoid negative response. Max-Total A to scoot up in bed despite cues. Therapist positioned pt out of crossed leg position into less adduction with towel roll floating heels- RN notified.      Follow Up Recommendations  SNF     Equipment Recommendations  None recommended by PT    Recommendations for Other Services       Precautions / Restrictions Precautions Precautions: Fall Restrictions Weight Bearing Restrictions: No    Mobility  Bed Mobility Overal bed mobility: Needs Assistance  General bed mobility comments: max/total A to scoot up in bed, increased time to position pt out of crossed leg position and place towel roll to  float heels and avoid adduction pressure at ankles  Transfers     Ambulation/Gait        Stairs             Wheelchair Mobility    Modified Rankin (Stroke Patients Only)       Balance           Cognition Arousal/Alertness: Lethargic Behavior During Therapy: Flat affect Overall Cognitive Status: No family/caregiver present to determine baseline cognitive functioning   General Comments: Pt opens eyes to stimulus, appropriately states name, unable to answer other orientation questions and inconsistently follows one step commands      Exercises General Exercises - Lower Extremity Ankle Circles/Pumps: Both;10 reps;Supine (AAROM 50% with verbal/tactile cues and PROM 50%)    General Comments        Pertinent Vitals/Pain Pain Assessment: Faces Faces Pain Scale: Hurts little more Pain Location: BLE with initial  mobility Pain Descriptors / Indicators: Grimacing;Guarding Pain Intervention(s): Limited activity within patient's tolerance;Monitored during session;Repositioned    Home Living                      Prior Function            PT Goals (current goals can now be found in the care plan section) Acute Rehab PT Goals Patient Stated Goal: not stated PT Goal Formulation: With patient Time For Goal Achievement: 03/11/20 Potential to Achieve Goals: Fair Progress towards PT goals: Not progressing toward goals - comment (lethargy limiting session)    Frequency    Min 3X/week  PT Plan Current plan remains appropriate    Co-evaluation              AM-PAC PT "6 Clicks" Mobility   Outcome Measure  Help needed turning from your back to your side while in a flat bed without using bedrails?: A Lot Help needed moving from lying on your back to sitting on the side of a flat bed without using bedrails?: A Lot Help needed moving to and from a bed to a chair (including a wheelchair)?: Total Help needed standing up from a chair using your  arms (e.g., wheelchair or bedside chair)?: Total Help needed to walk in hospital room?: Total Help needed climbing 3-5 steps with a railing? : Total 6 Click Score: 8    End of Session   Activity Tolerance: Patient limited by pain;Patient limited by lethargy Patient left: in bed;with call bell/phone within reach;with bed alarm set (mitt restraints on during session) Nurse Communication: Mobility status;Other (comment) (pressure between BLE, positioned with heels floating and preventing adduction pressure at ankles) PT Visit Diagnosis: Unsteadiness on feet (R26.81);Other abnormalities of gait and mobility (R26.89);Muscle weakness (generalized) (M62.81)     Time: 6681-5947 PT Time Calculation (min) (ACUTE ONLY): 10 min  Charges:  $Therapeutic Activity: 8-22 mins                      Talbot Grumbling PT, DPT 03/03/20, 2:39 PM [phone]

## 2020-03-04 DIAGNOSIS — G9341 Metabolic encephalopathy: Secondary | ICD-10-CM | POA: Diagnosis not present

## 2020-03-04 DIAGNOSIS — R7881 Bacteremia: Secondary | ICD-10-CM | POA: Diagnosis not present

## 2020-03-04 DIAGNOSIS — Z7189 Other specified counseling: Secondary | ICD-10-CM

## 2020-03-04 DIAGNOSIS — Z66 Do not resuscitate: Secondary | ICD-10-CM

## 2020-03-04 DIAGNOSIS — Z515 Encounter for palliative care: Secondary | ICD-10-CM

## 2020-03-04 DIAGNOSIS — F0390 Unspecified dementia without behavioral disturbance: Secondary | ICD-10-CM | POA: Diagnosis not present

## 2020-03-04 DIAGNOSIS — A419 Sepsis, unspecified organism: Secondary | ICD-10-CM | POA: Diagnosis not present

## 2020-03-04 DIAGNOSIS — R6520 Severe sepsis without septic shock: Secondary | ICD-10-CM | POA: Diagnosis not present

## 2020-03-04 DIAGNOSIS — R1312 Dysphagia, oropharyngeal phase: Secondary | ICD-10-CM

## 2020-03-04 LAB — CBC
HCT: 26.4 % — ABNORMAL LOW (ref 39.0–52.0)
Hemoglobin: 7.7 g/dL — ABNORMAL LOW (ref 13.0–17.0)
MCH: 26.6 pg (ref 26.0–34.0)
MCHC: 29.2 g/dL — ABNORMAL LOW (ref 30.0–36.0)
MCV: 91 fL (ref 80.0–100.0)
Platelets: 374 10*3/uL (ref 150–400)
RBC: 2.9 MIL/uL — ABNORMAL LOW (ref 4.22–5.81)
RDW: 15 % (ref 11.5–15.5)
WBC: 10 10*3/uL (ref 4.0–10.5)
nRBC: 0 % (ref 0.0–0.2)

## 2020-03-04 LAB — GLUCOSE, CAPILLARY
Glucose-Capillary: 105 mg/dL — ABNORMAL HIGH (ref 70–99)
Glucose-Capillary: 204 mg/dL — ABNORMAL HIGH (ref 70–99)
Glucose-Capillary: 431 mg/dL — ABNORMAL HIGH (ref 70–99)
Glucose-Capillary: 209 mg/dL — ABNORMAL HIGH (ref 70–99)

## 2020-03-04 LAB — BASIC METABOLIC PANEL
Anion gap: 12 (ref 5–15)
BUN: 61 mg/dL — ABNORMAL HIGH (ref 8–23)
CO2: 25 mmol/L (ref 22–32)
Calcium: 8.3 mg/dL — ABNORMAL LOW (ref 8.9–10.3)
Chloride: 111 mmol/L (ref 98–111)
Creatinine, Ser: 2.55 mg/dL — ABNORMAL HIGH (ref 0.61–1.24)
GFR, Estimated: 21 mL/min — ABNORMAL LOW (ref >60)
Glucose, Bld: 93 mg/dL (ref 70–99)
Potassium: 3.8 mmol/L (ref 3.5–5.1)
Sodium: 148 mmol/L — ABNORMAL HIGH (ref 135–145)

## 2020-03-04 LAB — SARS CORONAVIRUS 2 BY RT PCR (HOSPITAL ORDER, PERFORMED IN ~~LOC~~ HOSPITAL LAB): SARS Coronavirus 2: NEGATIVE

## 2020-03-04 MED ORDER — STARCH (THICKENING) PO POWD
ORAL | Status: DC | PRN
  Filled 2020-03-04: qty 227

## 2020-03-04 MED ORDER — INSULIN ASPART 100 UNIT/ML ~~LOC~~ SOLN: 20.0000 [IU] | Freq: Once | SUBCUTANEOUS | Status: DC

## 2020-03-04 MED ORDER — INSULIN ASPART 100 UNIT/ML ~~LOC~~ SOLN
16.0000 [IU] | Freq: Once | SUBCUTANEOUS | Status: AC
  Administered 2020-03-04: 16 [IU] via SUBCUTANEOUS

## 2020-03-04 NOTE — Consult Note (Signed)
Consultation Note Date: 03/04/2020   Patient Name: Paul Perez  DOB: February 22, 1928  MRN: [id number]  Age / Sex: 86 y.o., male  PCP: [facility] Referring Physician: Nita Sells, MD  Reason for Consultation: Establishing goals of care  HPI/Patient Profile: 86 y.o. male  with past medical history of dementia, CKD stage IV, DM, PAC's, GIB, HTN admitted on 02/22/2020 with confusion and fever. Recently treated for UTI with Keflex. Found to have severe sepsis secondary to pseudomonas bacteremia. Hospitalization complicated by DKA, atrial fibrillation, and dysphagia. MBSS revealed trace silent aspiration. Recurrent hypernatremia. Patient remains confused likely multifactorial with infectious/metabolic etiology as well as underlying dementia and hospital delirium. Palliative medicine consultation for goals of care.   Clinical Assessment and Goals of Care:  I have reviewed medical records, discussed with care team, and visited patient at bedside. He is alert to self, otherwise disoriented with baseline dementia. He is asking for water.   This AM, spoke with patient's son Yavier Snider) who is reported legal guardian and patient's daughter [name]) via telephone.   I introduced Palliative Medicine as specialized medical care for people living with serious illness. It focuses on providing relief from the symptoms and stress of a serious illness. The goal is to improve quality of life for both the patient and the family.  We discussed a brief life review of the patient. Four children. Diagnosed with dementia in 2018. Prior to admission, patient living at ALF. Able to ambulate with walker. Appetite fair. Required some assist with ADL's. Recently treated for UTI and family shares he has progressively declined since the UTI and now acute hospitalization.    Discussed events leading up to admission and  course of hospitalization including diagnoses, interventions, plan of care. Discussed expectations and disease trajectory of dementia. Discussed high risk for recurrent hospitalizations secondary to complications of progressive dementia.   I attempted to elicit values and goals of care important to the patient and children. Advanced directives, concepts specific to code status, artifical feeding and hydration, and rehospitalization were considered and discussed. [name] and Vicente Males share that their father does NOT have a documented living will and never wished to speak of his wishes. Vicente Males is reported legal guardian but it is important for him to discuss medical decisions with [name] as well as his two other brothers.   Medically recommended consideration of limitations to care including DNR/DNI with underlying age, frailty, irreversible conditions, and concern that this will only cause pain and suffering when EOL is imminent. Discussed code blue scenario. Also educated on medical recommendation against feeding tube placement in advanced dementia.   Vicente Males and [name] understand and agree with medical recommendations for DNR/DNI. "He would not get any better." Vicente Males and [name] do wish for DNR code status but prior to completing MOST form with me, Vicente Males would like to discuss with his two brothers. Derek plans to meet me this afternoon at 2pm to complete MOST form.   Answered questions and concerns regarding plan for SNF rehab and briefly introduced  hospice philosophy/options if he fails to progress at rehab. [name] will not be at the meeting this afternoon.    1400: Met with patient's son Khaleef Ruby) and his wife in family waiting. Again introduced role of palliative medicine. Discussed course of hospitalization including diagnoses, interventions, plan of care, dementia trajectory, and high risk for recurrent hospitalization.  Vicente Males spoke with his other two brother's and updated them on his father's  condition and plan to complete MOST form with DNR.  Discussed and completed MOST form. Decisions include: DNR/DNI, limited additional interventions including re-hospitalization if necessary, IVF/ABX if indicated, and NO feeding tube. Durable DNR completed. Electronic [employer] MOST form completed. Copies made for chart and Vicente Males.   Discussed plan for Mr. Kuenzel to discharge to [facility] SNF to attempt rehab when medically stable. Family is agreeable with outpatient palliative following at SNF. Discussed hospice philosophy and options. Family open to further comfort/hospice discussions pending his progression at rehab.   Answered questions. PMT contact information given.    SUMMARY OF RECOMMENDATIONS    GOC discussion with patient's son Vicente Males) and daughter [name]). Vicente Males is reported legal guardian. Documentation requested for EMR. Vicente Males has updated patient's other two sons on his condition and plan to complete MOST form.   MOST form completed. Decisions include: DNR/DNI, limited additional interventions including recurrent hospitalization if necessary, IVF/ABX if indicated, NO feeding tube. Electronic [employer] MOST completed. Durable DNR completed.   Continue current plan of care and medical management.   Continue current dysphagia diet and aspiration precautions. Patient needs assist and encouragement with meals.   Family hopeful for discharge to [facility] SNF to attempt rehab. Family open to further comfort/hospice discussions pending progression at SNF.  Outpatient palliative referral.   Code Status/Advance Care Planning:  DNR/DNI  Symptom Management:   Per attending  Palliative Prophylaxis:   Aspiration, Bowel Regimen, Delirium Protocol, Frequent Pain Assessment, Oral Care and Turn Reposition   Psycho-social/Spiritual:   Desire for further Chaplaincy support: yes  Additional Recommendations: Caregiving  Support/Resources, Compassionate Wean Education and Education on  Hospice  Prognosis:   Poor long-term prognosis with progressive dementia  Discharge Planning: [place] for rehab with Palliative care service follow-up      Primary Diagnoses: Present on Admission: . (Resolved) Severe sepsis with acute organ dysfunction ([place]) . Dementia ([place]) . Bradycardia . Altered mental state . Diabetes mellitus type 2, uncontrolled, with complications ([place]) . Hypertension . (Resolved) Acute metabolic encephalopathy . Bacteremia due to Pseudomonas . CKD (chronic kidney disease) stage 4, GFR 15-29 ml/min (HCC)   I have reviewed the medical record, interviewed the patient and family, and examined the patient. The following aspects are pertinent.  Past Medical History:  Diagnosis Date  . Dementia ([place])   . Diabetes mellitus without complication ([place])   . Encephalopathy   . Gastritis 2004  . GIB (gastrointestinal bleeding) 2004  . Hypertension   . Poor historian   . Sinus bradycardia seen on cardiac monitor    Social History   Socioeconomic History  . Marital status: Widowed    Spouse name: Not on file  . Number of children: Not on file  . Years of education: Not on file  . Highest education level: Not on file  Occupational History  . Not on file  Tobacco Use  . Smoking status: Never Smoker  . Smokeless tobacco: Never Used  Vaping Use  . Vaping Use: Never used  Substance and Sexual Activity  . Alcohol use: No  . Drug use:  No  . Sexual activity: Not on file  Other Topics Concern  . Not on file  Social History Narrative  . Not on file   Social Determinants of Health   Financial Resource Strain:   . Difficulty of Paying Living Expenses: Not on file  Food Insecurity:   . Worried About Charity fundraiser in the Last Year: Not on file  . Ran Out of Food in the Last Year: Not on file  Transportation Needs:   . Lack of Transportation (Medical): Not on file  . Lack of Transportation (Non-Medical): Not on file  Physical  Activity:   . Days of Exercise per Week: Not on file  . Minutes of Exercise per Session: Not on file  Stress:   . Feeling of Stress : Not on file  Social Connections:   . Frequency of Communication with Friends and Family: Not on file  . Frequency of Social Gatherings with Friends and Family: Not on file  . Attends Religious Services: Not on file  . Active Member of Clubs or Organizations: Not on file  . Attends Archivist Meetings: Not on file  . Marital Status: Not on file   Family History  Problem Relation Age of Onset  . Diabetes Mother   . Diabetes Sister    Scheduled Meds: . busPIRone  5 mg Oral BID  . Chlorhexidine Gluconate Cloth  6 each Topical Daily  . cloNIDine  0.1 mg Transdermal Weekly  . docusate sodium  100 mg Oral BID  . donepezil  5 mg Oral QHS  . enoxaparin (LOVENOX) injection  30 mg Subcutaneous Daily  . hydrALAZINE  75 mg Oral TID  . insulin aspart  0-9 Units Subcutaneous TID WC  . insulin aspart  5 Units Subcutaneous TID WC  . insulin detemir  20 Units Subcutaneous Daily  . mouth rinse  15 mL Mouth Rinse BID  . metoprolol tartrate  25 mg Oral BID  . polyethylene glycol  17 g Oral Daily  . senna  1 tablet Oral BID  . sodium chloride flush  3 mL Intravenous Q12H   Continuous Infusions:  PRN Meds:.acetaminophen **OR** acetaminophen, dextrose, food thickener, hydrALAZINE, melatonin, ondansetron **OR** ondansetron (ZOFRAN) IV Medications Prior to Admission:  Prior to Admission medications   Medication Sig Start Date End Date Taking? Authorizing Provider  Personal assistant [facility]) OINT Apply liberal amount to coccyx, sacrum and bilateral buttocks q shift and prn after incontinence episodes for skin protection   Yes [provider]  busPIRone (BUSPAR) 5 MG tablet Take 5 mg by mouth 2 (two) times daily.   Yes [provider]  cloNIDine (CATAPRES) 0.2 MG tablet Take 0.2 mg by mouth 2 (two) times daily. 02/20/20  Yes [provider]  donepezil (ARICEPT) 5 MG tablet Take 5 mg by mouth at bedtime.   Yes [provider]  furosemide (LASIX) 40 MG tablet Take 40 mg by mouth.   Yes [provider]  hydrALAZINE (APRESOLINE) 100 MG tablet Take 100 mg by mouth 2 (two) times daily. 02/24/20  Yes [provider]  iron polysaccharides (NIFEREX) 150 MG capsule Take 150 mg by mouth daily.   Yes [provider]  pioglitazone (ACTOS) 15 MG tablet Take 1 tablet by mouth daily. 12/01/16  Yes [provider]  polyethylene glycol (MIRALAX / GLYCOLAX) packet Take 17 g by mouth daily as needed for mild constipation. 08/30/17  Yes Barton Dubois, MD  QUEtiapine (SEROQUEL) 25 MG tablet  Take 12.5 mg by mouth daily. 02/20/20  Yes [provider]  Vitamin D, Cholecalciferol, 1000 units TABS Take 1,000 Units by mouth daily.    Yes [provider]   No Known Allergies Review of Systems  Unable to perform ROS  Physical Exam Vitals and nursing note reviewed.  Constitutional:      Appearance: He is ill-appearing.  HENT:     Head: Normocephalic and atraumatic.  Pulmonary:     Effort: No tachypnea, accessory muscle usage or respiratory distress.  Skin:    General: Skin is warm and dry.  Neurological:     Mental Status: He is easily aroused.     Comments: Oriented to self, otherwise disoriented with baseline dementia  Psychiatric:        Attention and Perception: He is inattentive.        Speech: Speech is delayed.        Cognition and Memory: Cognition is impaired.    Vital Signs: BP (!) 150/57 (BP Location: Right Wrist)   Pulse 90   Temp 98.8 F (37.1 C) (Oral)   Resp 17   Ht 6' (1.829 m)   Wt 83.1 kg   SpO2 100%   BMI 24.85 kg/m  Pain Scale: PAINAD   Pain Score: 0-No pain   SpO2: SpO2: 100 % O2 Device:SpO2: 100 % O2 Flow Rate: .O2 Flow Rate (L/min): 2 L/min  IO: Intake/output summary:   Intake/Output Summary (Last 24 hours) at 03/04/2020 1528 Last  data filed at 03/04/2020 0900 Gross per 24 hour  Intake 720 ml  Output 100 ml  Net 620 ml    LBM: Last BM Date: 02/28/20 Baseline Weight: Weight: 90.7 kg Most recent weight: Weight: 83.1 kg     Palliative Assessment/Data: PPS 40%   Flowsheet Rows     Most Recent Value  Intake Tab  Referral Department Hospitalist  Unit at Time of Referral Med/Surg Unit  Palliative Care Primary Diagnosis Neurology  Palliative Care Type New Palliative care  Reason for referral Clarify Goals of Care  Date first seen by Palliative Care 03/04/20  Clinical Assessment  Palliative Performance Scale Score 40%  Psychosocial & Spiritual Assessment  Palliative Care Outcomes  Patient/Family meeting held? Yes  Who was at the meeting? son and daughter, then son and DIL  Palliative Care Outcomes Clarified goals of care, Counseled regarding hospice, Provided end of life care assistance, Provided advance care planning, Provided psychosocial or spiritual support, Changed CPR status, Completed durable DNR, Linked to palliative care logitudinal support, ACP counseling assistance      Time In/Out: 1015-1045, 1400-1450 Time Total: 34mn Greater than 50%  of this time was spent counseling and coordinating care related to the above assessment and plan.  Signed by:  MIhor Dow DNP, FNP-C Palliative Medicine Team  Phone: [phone]: [phone]  Please contact Palliative Medicine Team phone at [phone] questions and concerns.  For individual provider: See AShea Evans

## 2020-03-04 NOTE — Progress Notes (Signed)
[facility]  PROGRESS NOTE  Paul Perez:[id number] DOB: 1927/11/08 DOA: 02/22/2020 PCP: Alanson Puls [place] date - 02/22/2020   Admitting Physician Shawna Clamp, MD  Outpatient Primary MD for the patient is [name]  LOS - 25 Brief Narrative  86 y/o M DM tyii, dementia, HTN, CKDStage IV, PACs who presented on 2/9 with confusion and fever after recently being treated for UTI with Keflex and was found to have severe sepsis secondary to Pseudomonas bacteremia.  Hospital course complicated by DKA, paroxysmal atrial fibrillation, oropharyngeal dysphagia.  Subjective   Not doing much not eating not drinking much per NT who is at bedside He is verbal but not really talkative and doesn't orient well  A & P  Mild oropharyngeal dysphagia.  Underwent barium swallow on 10/13.  Chest x-ray on 10/13 unremarkable -Speech recommends dysphagia 3 diet -Aspiration precautions  Paroxysmal atrial fibrillation with RVR, currently in normal sinus rhythm and rate controlled.  Occurred in setting of potential aspiration event. -Appreciate cardiology recommendations, continue oral Lopressor 25 mg twice daily -Not a candidate for anticoagulation given dementia, and prior history of GI bleed   Hypertension, not at goal, but much improved.  SBP in the 150-160s -Continue current dose of hydralazine 75 mg 3 times daily, tolerating Lopressor 25 mg twice daily, clonidine patch -Continue to monitor, considering addition amlodipine if remains elevated -Clonidine patch, Lasix  Severe sepsis secondary to pseudomonal bacteremia, resolved sepsis physiology resolved, currently afebrile -Completed 7-day course of IV cefepime, end date 10/17 -Repeat blood cultures unremarkable  DKA, resolved Type 2 diabetes, poorly controlled A1c greater than 15.  CBGs range 93-204 - Levemir to 20 U, NovoLog to 5 units 3 times daily as long as eating greater than 50% of meals -cont SSI  Mood  disorder, stable -BuSpar  Hypernatremia,recurrent.  Sodium 152.  Suspect decreased oral intake in the setting of baseline dementia.  poor prognosticating factor given this tends to correct her inpatient  -Resume D5 over 24 hours, repeat BMP this afternoon -Palliative care to assist with goals of care discussions with family -Monitor BMP daily  Elevated liver enzymes, resolved.  Right upper quad ultrasound showed cholelithiasis.  CKD, stage IV, creatinine stable at baseline.  Did have peak creatinine of 3.58 during hospitalization for which nephrology recommended continue supportive care -Avoid nephrotoxic agents  Acute encephalopathy, multifactorial (infectious, metabolic), waxes and wanes.  Alert to self this morning, intermittently able to tell me his name and that he is in no pain.  In setting of dementia for which baseline is able to have conversation per daughter.  Likely further complicated by hospital-acquired delirium -Delirium precautions -Expect improvement with treatment of medical issues -Continue home Aricept -Continue goals of care discussions  Goals of care  there is no healthcare power of attorney  decisions for him to be made jointly amongst siblings I have had conversations with his daughter Paul Perez, --currently full code/full scope --Palliative consulted to assist with [place] discussions    Family Communication  : Spoke with daughter Paul Perez at bedside on 10/14, will update today  Code Status : Full code, discussed patient's desire of advanced directives with [name] who states he has no specific directives.  She has initiated conversations with her brothers regarding goals of care  Disposition Plan  :  Patient is from ALF. Anticipated d/c date: 2 to 3 days. Barriers to d/c or necessity for inpatient status:  Needs to continue IV cefepime, close monitoring of A. fib and  hypernatremia, close monitoring of BP and mental status.  Once medically stable plan for  SNF Consults  : Cardiology  Procedures  : Barium swallow, 10/13  DVT Prophylaxis  :  Lovenox   MDM: The below labs and imaging reports were reviewed and summarized above.  Medication management as above.  Lab Results  Component Value Date   PLT 374 03/04/2020    Diet :  Diet Order            DIET DYS 3 Room service appropriate? Yes; Fluid consistency: Nectar Thick  Diet effective now                  Inpatient Medications Scheduled Meds: . busPIRone  5 mg Oral BID  . Chlorhexidine Gluconate Cloth  6 each Topical Daily  . cloNIDine  0.1 mg Transdermal Weekly  . docusate sodium  100 mg Oral BID  . donepezil  5 mg Oral QHS  . enoxaparin (LOVENOX) injection  30 mg Subcutaneous Daily  . hydrALAZINE  75 mg Oral TID  . insulin aspart  0-9 Units Subcutaneous TID WC  . insulin aspart  5 Units Subcutaneous TID WC  . insulin detemir  20 Units Subcutaneous Daily  . mouth rinse  15 mL Mouth Rinse BID  . metoprolol tartrate  25 mg Oral BID  . polyethylene glycol  17 g Oral Daily  . senna  1 tablet Oral BID  . sodium chloride flush  3 mL Intravenous Q12H   Continuous Infusions: . lactated ringers     PRN Meds:.acetaminophen **OR** acetaminophen, dextrose, hydrALAZINE, melatonin, ondansetron **OR** ondansetron (ZOFRAN) IV  Antibiotics  :   Anti-infectives (From admission, onward)   Start     Dose/Rate Route Frequency Ordered Stop   02/24/20 1300  vancomycin (VANCOCIN) IVPB 1000 mg/200 mL premix  Status:  Discontinued        1,000 mg 200 mL/hr over 60 Minutes Intravenous Every 48 hours 02/22/20 1331 02/24/20 0839   02/23/20 1500  ceFEPIme (MAXIPIME) 2 g in sodium chloride 0.9 % 100 mL IVPB        2 g 200 mL/hr over 30 Minutes Intravenous Every 24 hours 02/22/20 1457 02/28/20 1731   02/22/20 2000  metroNIDAZOLE (FLAGYL) IVPB 500 mg  Status:  Discontinued        500 mg 100 mL/hr over 60 Minutes Intravenous Every 8 hours 02/22/20 1549 02/24/20 0839   02/22/20 1300  vancomycin  (VANCOREADY) IVPB 2000 mg/400 mL        2,000 mg 200 mL/hr over 120 Minutes Intravenous  Once 02/22/20 1219 02/22/20 1545   02/22/20 1215  ceFEPIme (MAXIPIME) 2 g in sodium chloride 0.9 % 100 mL IVPB        2 g 200 mL/hr over 30 Minutes Intravenous  Once 02/22/20 1207 02/22/20 1406   02/22/20 1215  metroNIDAZOLE (FLAGYL) IVPB 500 mg        500 mg 100 mL/hr over 60 Minutes Intravenous  Once 02/22/20 1207 02/22/20 1406   02/22/20 1215  vancomycin (VANCOCIN) IVPB 1000 mg/200 mL premix  Status:  Discontinued        1,000 mg 200 mL/hr over 60 Minutes Intravenous  Once 02/22/20 1207 02/22/20 1219       Objective   Vitals:   03/03/20 2034 03/03/20 2037 03/04/20 0528 03/04/20 1356  BP:  (!) 145/73 (!) 162/94 (!) 150/57  Pulse:  65 62 90  Resp:  20 20 17   Temp:  98.1 F (36.7 C) (!)  97.4 F (36.3 C) 98.8 F (37.1 C)  TempSrc:  Oral Oral Oral  SpO2: 95% 96% 99% 100%  Weight:      Height:        SpO2: 100 % O2 Flow Rate (L/min): 2 L/min  Wt Readings from Last 3 Encounters:  02/28/20 83.1 kg  02/17/20 90.7 kg  02/14/20 87.1 kg     Intake/Output Summary (Last 24 hours) at 03/04/2020 1412 Last data filed at 03/04/2020 0900 Gross per 24 hour  Intake 720 ml  Output 100 ml  Net 620 ml    Physical Exam:  Lying in bed comfortably, Alert, only oriented to self, no distress breathign a little heavier No new F.N deficits,  Swelling in upper extremities (Right arm > Left)with scattered bruising [place].AT, Dry oral mucosa CTA b no added sound abd soft nt nd no rebound Neuro intact moving limbs x4  I have personally reviewed the following:   Data Reviewed:  CBC Recent Labs  Lab 02/29/20 0708 03/01/20 0622 03/02/20 0609 03/03/20 0630 03/04/20 0516  WBC 11.4* 9.8 9.8 9.5 10.0  HGB 8.4* 8.0* 7.8* 7.4* 7.7*  HCT 28.2* 27.2* 25.4* 24.4* 26.4*  PLT 267 271 322 316 374  MCV 89.0 92.5 88.2 91.0 91.0  MCH 26.5 27.2 27.1 27.6 26.6  MCHC 29.8* 29.4* 30.7 30.3 29.2*  RDW 14.7  15.1 14.9 15.2 15.0    Chemistries  Recent Labs  Lab 03/01/20 0622 03/01/20 0622 03/02/20 0609 03/02/20 1438 03/03/20 0630 03/03/20 1352 03/04/20 0516  NA 147*   < > 152* 149* 146* 143 148*  K 3.5   < > 3.4* 3.3* 3.8 4.2 3.8  CL 116*   < > 115* 114* 113* 111 111  CO2 21*   < > 24 24 22 22 25   GLUCOSE 274*   < > 156* 147* 186* 402* 93  BUN 72*   < > 71* 68* 63* 63* 61*  CREATININE 2.87*   < > 2.73* 2.70* 2.64* 2.82* 2.55*  CALCIUM 8.2*   < > 8.4* 8.2* 7.8* 8.0* 8.3*  MG 2.3  --   --  2.5*  --   --   --    < > = values in this interval not displayed.   ------------------------------------------------------------------------------------------------------------------ No results for input(s): CHOL, HDL, LDLCALC, TRIG, CHOLHDL, LDLDIRECT in the last 72 hours.  Lab Results  Component Value Date   HGBA1C >15.5 (H) 02/22/2020   ------------------------------------------------------------------------------------------------------------------ No results for input(s): TSH, T4TOTAL, T3FREE, THYROIDAB in the last 72 hours.  Invalid input(s): FREET3 ------------------------------------------------------------------------------------------------------------------ No results for input(s): VITAMINB12, FOLATE, FERRITIN, TIBC, IRON, RETICCTPCT in the last 72 hours.  Coagulation profile No results for input(s): INR, PROTIME in the last 168 hours.  No results for input(s): DDIMER in the last 72 hours.  Cardiac Enzymes No results for input(s): CKMB, TROPONINI, MYOGLOBIN in the last 168 hours.  Invalid input(s): CK ------------------------------------------------------------------------------------------------------------------ No results found for: BNP  Micro Results Recent Results (from the past 240 hour(s))  Culture, blood (routine x 2)     Status: None   Collection Time: 02/27/20  9:53 AM   Specimen: BLOOD RIGHT WRIST  Result Value Ref Range Status   Specimen Description BLOOD  RIGHT WRIST  Final   Special Requests   Final    BOTTLES DRAWN AEROBIC AND ANAEROBIC Blood Culture adequate volume   Culture   Final    NO GROWTH 5 DAYS Performed at [facility], [address], [place], [place] [address]  Report Status 03/03/2020 FINAL  Final  Culture, blood (routine x 2)     Status: None   Collection Time: 02/27/20  9:54 AM   Specimen: BLOOD RIGHT FOREARM  Result Value Ref Range Status   Specimen Description BLOOD RIGHT FOREARM  Final   Special Requests   Final    BOTTLES DRAWN AEROBIC AND ANAEROBIC Blood Culture adequate volume   Culture   Final    NO GROWTH 5 DAYS Performed at [facility], [address] [place], [place] [address]    Report Status 03/03/2020 FINAL  Final    Radiology Reports DG Thoracic Spine W/Swimmers  Result Date: 02/14/2020 CLINICAL DATA:  Bilateral leg swelling.  No reported back symptoms EXAM: THORACIC SPINE - 3 VIEWS COMPARISON:  None. FINDINGS: Diffuse bridging osteophytes from spondylosis. Generalized disc narrowing. No evidence of fracture, endplate erosion, or focal bone lesion. Maintained posterior mediastinal fat planes when allowing for rotation. IMPRESSION: 1. Generalized spondylosis with multi-level bridging. 2. No acute finding. Electronically Signed   By: Monte Fantasia M.D.   On: 02/14/2020 08:12   DG Lumbar Spine Complete  Result Date: 02/14/2020 CLINICAL DATA:  Leg swelling.  No known injury.  Dementia. EXAM: LUMBAR SPINE - COMPLETE 4+ VIEW COMPARISON:  None. FINDINGS: Diffuse disc narrowing and bulky endplate spurring. Diffuse facet degenerative spurring. Multilevel ankylosis is likely present. No evidence of fracture, erosion or bone lesion. IMPRESSION: 1. No acute finding. 2. Advanced lumbar spine degeneration with multiple bridging osteophytes. Electronically Signed   By: Monte Fantasia M.D.   On: 02/14/2020 08:11   DG Pelvis 1-2 Views  Result Date: 02/14/2020 CLINICAL DATA:  Bilateral leg swelling with no known  injury or back pain EXAM: PELVIS - 1-2 VIEW COMPARISON:  August 25, 2017 FINDINGS: Bilateral hip degenerative changes. No signs of acute fracture or bone abnormality. Degenerative changes also noted incidentally in the lower lumbar spine. IMPRESSION: Bilateral hip degenerative changes. No signs of acute fracture. Electronically Signed   By: Zetta Bills M.D.   On: 02/14/2020 08:07   DG Ankle Complete Right  Result Date: 02/14/2020 CLINICAL DATA:  Bilateral foot pain and swelling. EXAM: RIGHT ANKLE - COMPLETE 3+ VIEW COMPARISON:  None. FINDINGS: Nonspecific soft tissue swelling. Profound osteopenia. No acute fracture or subluxation. Bilateral malleolar spurring from enthesophytes. IMPRESSION: 1. Soft tissue swelling without acute osseous finding. 2. Marked osteopenia. Electronically Signed   By: Monte Fantasia M.D.   On: 02/14/2020 07:17   DG Abd 1 View  Result Date: 03/01/2020 CLINICAL DATA:  Abdominal distension EXAM: ABDOMEN - 1 VIEW COMPARISON:  01/04/2006 FINDINGS: Scattered large and small bowel gas is noted. Contrast material is noted in the colon from a prior modified barium swallow. No free air is seen. No obstructive changes are noted. Degenerative changes of lumbar spine are seen. IMPRESSION: No acute abnormality noted. Electronically Signed   By: Inez Catalina M.D.   On: 03/01/2020 15:51   DG CHEST PORT 1 VIEW  Result Date: 02/26/2020 CLINICAL DATA:  Possible aspiration EXAM: PORTABLE CHEST 1 VIEW COMPARISON:  02/22/2020 FINDINGS: Cardiac shadow is stable. Aortic calcifications are again seen. The lungs are well aerated bilaterally. No focal infiltrate or sizable effusion is seen. No bony abnormality is noted. IMPRESSION: No acute abnormality seen.  No change from the prior exam. Electronically Signed   By: Inez Catalina M.D.   On: 02/26/2020 17:41   DG Chest Port 1 View  Result Date: 02/22/2020 CLINICAL DATA:  Sepsis EXAM: PORTABLE CHEST 1  VIEW COMPARISON:  February 14, 2020 FINDINGS: The  heart size and mediastinal contours are within normal limits. Both lungs are clear. The visualized skeletal structures are unremarkable. IMPRESSION: No active disease. Electronically Signed   By: Abelardo Diesel M.D.   On: 02/22/2020 14:08   DG Chest Portable 1 View  Result Date: 02/14/2020 CLINICAL DATA:  Bilateral foot pain EXAM: PORTABLE CHEST 1 VIEW COMPARISON:  08/25/2017 FINDINGS: Normal heart size and stable aortic tortuosity. There is no edema, consolidation, effusion, or pneumothorax. Artifact from EKG leads. Degenerative endplate spurring. IMPRESSION: No evidence of acute disease. Electronically Signed   By: Monte Fantasia M.D.   On: 02/14/2020 07:06   DG Swallowing Func-Speech Pathology  Result Date: 02/26/2020 Objective Swallowing Evaluation: Type of Study: MBS-Modified Barium Swallow Study  Patient Details Name: MARKEVIUS TROMBETTA MRN: [id number] Date of Birth: 1927-11-02 Today's Date: 02/26/2020 Time: SLP Start Time (ACUTE ONLY): 1339 -SLP Stop Time (ACUTE ONLY): 1408 SLP Time Calculation (min) (ACUTE ONLY): 29 min Past Medical History: Past Medical History: Diagnosis Date . Dementia ([facility])  . Diabetes mellitus without complication ([place])  . Encephalopathy  . Gastritis 2004 . GIB (gastrointestinal bleeding) 2004 . Hypertension  . Poor historian  . Sinus bradycardia seen on cardiac monitor  Past Surgical History: Past Surgical History: Procedure Laterality Date . INGUINAL HERNIA REPAIR  09/2010  Bilateral . UPPER GASTROINTESTINAL ENDOSCOPY  2004 HPI: This 86 y.o.malewith medical history significant ofDementia,DiabetesMellitus II, Hypertension, CKD stage IV, premature atrial contractions,history of GI bleed, presents in the emergency department with confusion and fever. This is patient's third ER visit In last three weeks.Last week he presented in the ED with generalized weakness and pain,found to have a UTI . He was discharged on Keflex. Daughter reportshe has been having worsening pain  and weakness, was not able to stand up in the morning. Patient is admitted for sepsis secondary to Pseudomonas bacteremia.  He was also found to have high anion gap metabolic acidosis secondary to DKA which has been resolved. He is transitioned to subcu insulin. BSE requested.  Subjective: "I want water!" Assessment / Plan / Recommendation CHL IP CLINICAL IMPRESSIONS 02/26/2020 Clinical Impression Pt presents with mild oropharyngeal sensorimotor dysphagia characterized by decreased laryngeal vestibule closure resulting in two episodes of trace silent aspiration of thin liquids via straw and consistent penetration of thin liquids and NTL during the swallow that is usually expelled with repeat swallow of solid textures. Decreased pharyngeal constriction resulting in inconsistent clearance of penetrates however despite remaining in the airway, they were only visualized dropping to and below the cords twice as mentioned above. Solid textures were consumed without incident and the barium tablet was swallowed with thin liquids with noted brief stasis in the pyriforms but quickly passed through. RN was present for MBS and BP and HR were stable throughout procedure. Recommend D3/mech soft and Nectar thick liquids; recommend meds whole or crushed in puree as Pt is able. Also recommend free water protocol, * single ice chips after oral care in between meals *. ST will continue to follow acutely. SLP Visit Diagnosis Dysphagia, unspecified (R13.10) Attention and concentration deficit following -- Frontal lobe and executive function deficit following -- Impact on safety and function Mild aspiration risk;Moderate aspiration risk   CHL IP TREATMENT RECOMMENDATION 02/26/2020 Treatment Recommendations Therapy as outlined in treatment plan below   Prognosis 02/26/2020 Prognosis for Safe Diet Advancement Fair Barriers to Reach Goals Cognitive deficits Barriers/Prognosis Comment -- CHL IP DIET RECOMMENDATION 02/26/2020 SLP Diet  Recommendations Dysphagia  3 (Mech soft) solids;Nectar thick liquid Liquid Administration via Cup;No straw Medication Administration Whole meds with puree Compensations Slow rate;Small sips/bites Postural Changes Remain semi-upright after after feeds/meals (Comment);Seated upright at 90 degrees   CHL IP OTHER RECOMMENDATIONS 02/26/2020 Recommended Consults -- Oral Care Recommendations Oral care BID Other Recommendations Order thickener from pharmacy   CHL IP FOLLOW UP RECOMMENDATIONS 02/26/2020 Follow up Recommendations 24 hour supervision/assistance   CHL IP FREQUENCY AND DURATION 02/26/2020 Speech Therapy Frequency (ACUTE ONLY) min 2x/week Treatment Duration 1 week      CHL IP ORAL PHASE 02/26/2020 Oral Phase WFL Oral - Pudding Teaspoon -- Oral - Pudding Cup -- Oral - Honey Teaspoon -- Oral - Honey Cup -- Oral - Nectar Teaspoon -- Oral - Nectar Cup -- Oral - Nectar Straw -- Oral - Thin Teaspoon -- Oral - Thin Cup -- Oral - Thin Straw -- Oral - Puree -- Oral - Mech Soft -- Oral - Regular -- Oral - Multi-Consistency -- Oral - Pill -- Oral Phase - Comment --  CHL IP PHARYNGEAL PHASE 02/26/2020 Pharyngeal Phase Impaired Pharyngeal- Pudding Teaspoon -- Pharyngeal -- Pharyngeal- Pudding Cup -- Pharyngeal -- Pharyngeal- Honey Teaspoon Pharyngeal residue - valleculae;Pharyngeal residue - pyriform;Reduced pharyngeal peristalsis Pharyngeal -- Pharyngeal- Honey Cup -- Pharyngeal -- Pharyngeal- Nectar Teaspoon Reduced pharyngeal peristalsis;Reduced airway/laryngeal closure;Penetration/Aspiration during swallow;Pharyngeal residue - valleculae;Pharyngeal residue - pyriform Pharyngeal Material enters airway, remains ABOVE vocal cords then ejected out Pharyngeal- Nectar Cup Reduced pharyngeal peristalsis;Reduced airway/laryngeal closure;Penetration/Aspiration during swallow;Pharyngeal residue - valleculae;Pharyngeal residue - pyriform Pharyngeal Material enters airway, remains ABOVE vocal cords then ejected out Pharyngeal- Nectar  Straw -- Pharyngeal -- Pharyngeal- Thin Teaspoon Delayed swallow initiation-pyriform sinuses;Reduced pharyngeal peristalsis;Reduced epiglottic inversion;Reduced anterior laryngeal mobility;Reduced laryngeal elevation;Reduced airway/laryngeal closure;Reduced tongue base retraction;Penetration/Aspiration during swallow;Pharyngeal residue - valleculae;Pharyngeal residue - pyriform Pharyngeal Material enters airway, remains ABOVE vocal cords and not ejected out Pharyngeal- Thin Cup Delayed swallow initiation-pyriform sinuses;Reduced pharyngeal peristalsis;Reduced epiglottic inversion;Reduced anterior laryngeal mobility;Reduced laryngeal elevation;Reduced airway/laryngeal closure;Reduced tongue base retraction;Penetration/Aspiration during swallow;Pharyngeal residue - valleculae;Pharyngeal residue - pyriform Pharyngeal Material enters airway, remains ABOVE vocal cords and not ejected out Pharyngeal- Thin Straw Delayed swallow initiation-pyriform sinuses;Reduced pharyngeal peristalsis;Reduced epiglottic inversion;Reduced anterior laryngeal mobility;Reduced laryngeal elevation;Reduced airway/laryngeal closure;Reduced tongue base retraction;Penetration/Aspiration during swallow;Pharyngeal residue - valleculae;Pharyngeal residue - pyriform Pharyngeal Material enters airway, passes BELOW cords without attempt by patient to eject out (silent aspiration) Pharyngeal- Puree WFL Pharyngeal -- Pharyngeal- Mechanical Soft -- Pharyngeal -- Pharyngeal- Regular WFL Pharyngeal -- Pharyngeal- Multi-consistency -- Pharyngeal -- Pharyngeal- Pill Pharyngeal residue - cp segment;Pharyngeal residue - pyriform Pharyngeal -- Pharyngeal Comment --  CHL IP CERVICAL ESOPHAGEAL PHASE 02/26/2020 Cervical Esophageal Phase WFL Pudding Teaspoon -- Pudding Cup -- Honey Teaspoon -- Honey Cup -- Nectar Teaspoon -- Nectar Cup -- Nectar Straw -- Thin Teaspoon -- Thin Cup -- Thin Straw -- Puree -- Mechanical Soft -- Regular -- Multi-consistency -- Pill --  Cervical Esophageal Comment -- Amelia H. Roddie Mc, CCC-SLP Speech Language Pathologist Wende Bushy 02/26/2020, 4:49 PM              US Abdomen Limited RUQ  Result Date: 02/23/2020 CLINICAL DATA:  Elevated liver enzymes EXAM: ULTRASOUND ABDOMEN LIMITED RIGHT UPPER QUADRANT COMPARISON:  August 25, 2017 FINDINGS: Gallbladder: No wall thickening visualized. Gallstones are identified. 0.3 cm polyp is identified. No sonographic Murphy sign noted by sonographer. Common bile duct: Diameter: 0.3 mm Liver: 1.5 x 1.1 x 1.4 cm cyst is identified in the left lobe liver. Within normal limits in parenchymal echogenicity.  Portal vein is patent on color Doppler imaging with normal direction of blood flow towards the liver. Other: None. IMPRESSION: 1. Cholelithiasis without sonographic evidence of acute cholecystitis. 2. 1.5 cm cyst in the left lobe liver. Electronically Signed   By: Abelardo Diesel M.D.   On: 02/23/2020 14:49   Time Spent in minutes  30  Nita Sells M.D on 03/04/2020 at 2:12 PM  To page go to [email or web address] - password [facility]

## 2020-03-04 NOTE — TOC Progression Note (Signed)
Transition of Care [facility]) - Progression Note    Patient Details  Name: Paul Perez MRN: [id number] Date of Birth: [birth year]  Transition of Care [facility]) CM/SW [place], [place] Phone Number: 03/04/2020, 5:09 PM  Clinical Narrative:    CSW left message for Debbie at [facility] to confirm what OP Palliative [place] uses. TOC to follow.    Expected Discharge Plan: [place] Barriers to Discharge: Continued Medical Work up  Expected Discharge Plan and Services Expected Discharge Plan: [place] arrangements for the past 2 months: [place]                                       Social Determinants of Health (SDOH) Interventions    Readmission Risk Interventions No flowsheet data found.

## 2020-03-04 NOTE — TOC Progression Note (Signed)
Transition of Care [facility]) - Progression Note    Patient Details  Name: ISIDOR BROMELL MRN: [id number] Date of Birth: [birth year]  Transition of Care [facility]) CM/SW Contact  Salome Arnt, [place] Phone Number: 03/04/2020, 3:06 PM  Clinical Narrative:  LCSW updated Debbie at [facility] on pt. Awaiting palliative consult. Jackelyn Poling is checking to see if Josem Kaufmann is still valid. TOC to follow up.      Expected Discharge Plan: [place] Barriers to Discharge: Continued Medical Work up  Expected Discharge Plan and Services Expected Discharge Plan: [place] arrangements for the past 2 months: [place]                                       Social Determinants of Health (SDOH) Interventions    Readmission Risk Interventions No flowsheet data found.

## 2020-03-05 DIAGNOSIS — R6520 Severe sepsis without septic shock: Secondary | ICD-10-CM | POA: Diagnosis not present

## 2020-03-05 DIAGNOSIS — A419 Sepsis, unspecified organism: Secondary | ICD-10-CM | POA: Diagnosis not present

## 2020-03-05 LAB — GLUCOSE, CAPILLARY
Glucose-Capillary: 109 mg/dL — ABNORMAL HIGH (ref 70–99)
Glucose-Capillary: 74 mg/dL (ref 70–99)
Glucose-Capillary: 133 mg/dL — ABNORMAL HIGH (ref 70–99)
Glucose-Capillary: 120 mg/dL — ABNORMAL HIGH (ref 70–99)

## 2020-03-05 MED ORDER — AMLODIPINE BESYLATE 5 MG PO TABS
5.0000 mg | ORAL_TABLET | Freq: Every day | ORAL | Status: DC
  Administered 2020-03-05 – 2020-03-06 (×2): 5 mg via ORAL
  Filled 2020-03-05 (×2): qty 1

## 2020-03-05 NOTE — Progress Notes (Signed)
[facility]  PROGRESS NOTE  [name] WER:[id number] DOB: [birth year] DOA: 02/22/2020 PCP: Alanson Puls [facility] [place] date - 02/22/2020   Admitting Physician Shawna Clamp, MD  Outpatient Primary MD for the patient is [facility]  LOS - 28 Brief Narrative  86 y/o M DM tyii, dementia, HTN, CKDStage IV, PACs who presented on 2/9 with confusion and fever after recently being treated for UTI with Keflex and was found to have severe sepsis secondary to Pseudomonas bacteremia.  Hospital course complicated by DKA, paroxysmal atrial fibrillation, oropharyngeal dysphagia.  Subjective   Intermittently confused cannot really orient to well-it is unclear if he has really eaten anything He remains in bed  A & P  Mild oropharyngeal dysphagia.  Underwent barium swallow on 10/13.  Chest x-ray on 10/13 unremarkable -Speech recommends dysphagia 3 diet -Aspiration precautions  Paroxysmal atrial fibrillation with RVR, currently in normal sinus rhythm and rate controlled.  Occurred in setting of potential aspiration event. -Appreciate cardiology recommendations, continue oral Lopressor 25 mg twice daily -Not a candidate for anticoagulation given dementia, and prior history of GI bleed   Hypertension, not at goal, but much improved.  SBP in the 150-160s -Continue current dose of hydralazine 75 mg 3 times daily, tolerating Lopressor 25 mg twice daily, clonidine patch -adding amlodipine 5 as is elevated -Clonidine patch, Lasix  Severe sepsis secondary to pseudomonal bacteremia, resolved sepsis physiology resolved, currently afebrile -Completed 7-day course of IV cefepime, end date 10/17 -Repeat blood cultures unremarkable  DKA, resolved Type 2 diabetes, poorly controlled A1c greater than 15.  CBGs range 109-74 - Levemir to 20 U, NovoLog to 5 units 3 times daily as long as eating greater than 50% of meals -cont SSI  Mood disorder,  stable -BuSpar  Hypernatremia,recurrent.  Sodium 152.  Suspect decreased oral intake in the setting of baseline dementia.  poor prognosticating factor given this tends to correct her inpatient  -Resume D5 over 24 hours, repeat BMP this afternoon -Palliative care to assist with goals of care discussions with family -labs not done today--will repeat tomorrow  Elevated liver enzymes, resolved.  Right upper quad ultrasound showed cholelithiasis.  CKD, stage IV, creatinine stable at baseline.  Did have peak creatinine of 3.58 during hospitalization for which nephrology recommended continue supportive care -Avoid nephrotoxic agents  Acute encephalopathy, multifactorial (infectious, metabolic), waxes and wanes.  Alert to self this morning, intermittently able to tell me his name and that he is in no pain.  In setting of dementia for which baseline is able to have conversation per daughter.  Likely further complicated by hospital-acquired delirium -Delirium precautions -Continue home Aricept -Continue goals of care discussions  Goals of care --Palliative consulted to assist with [place] discussions --changed to DNR on 10/20-family discussing and deciding probably on skilled facility in the next 24 hours if authorization is obtained    Family Communication  : No communication with family  Code Status : No  CODE BLUE currently Disposition Plan  :  Patient is from ALF but is expected to go to skilled facility. Anticipated d/c date: 2 to 3 days. Barriers to d/c or necessity for inpatient status:   Consults  : Cardiology  Procedures  : Barium swallow, 10/13  DVT Prophylaxis  :  Lovenox   MDM: The below labs and imaging reports were reviewed and summarized above.  Medication management as above.  Lab Results  Component Value Date   PLT 374 03/04/2020    Diet :  Diet  Order            DIET DYS 3 Room service appropriate? Yes; Fluid consistency: Nectar Thick  Diet effective now                   Inpatient Medications Scheduled Meds: . busPIRone  5 mg Oral BID  . Chlorhexidine Gluconate Cloth  6 each Topical Daily  . cloNIDine  0.1 mg Transdermal Weekly  . docusate sodium  100 mg Oral BID  . donepezil  5 mg Oral QHS  . enoxaparin (LOVENOX) injection  30 mg Subcutaneous Daily  . insulin aspart  0-9 Units Subcutaneous TID WC  . insulin aspart  5 Units Subcutaneous TID WC  . insulin detemir  20 Units Subcutaneous Daily  . mouth rinse  15 mL Mouth Rinse BID  . metoprolol tartrate  25 mg Oral BID  . polyethylene glycol  17 g Oral Daily  . senna  1 tablet Oral BID  . sodium chloride flush  3 mL Intravenous Q12H   Continuous Infusions:  PRN Meds:.acetaminophen **OR** acetaminophen, dextrose, food thickener, hydrALAZINE, melatonin, [DISCONTINUED] ondansetron **OR** ondansetron (ZOFRAN) IV   Objective   Vitals:   03/04/20 2040 03/04/20 2129 03/05/20 0259 03/05/20 0609  BP: (!) 185/58  (!) 149/59 (!) 161/91  Pulse: 74  (!) 50 64  Resp:    18  Temp: 98.8 F (37.1 C)   97.7 F (36.5 C)  TempSrc: Oral   Oral  SpO2: 100% 96%  100%  Weight:      Height:        SpO2: 100 % O2 Flow Rate (L/min): 2 L/min  Wt Readings from Last 3 Encounters:  02/28/20 83.1 kg  02/17/20 90.7 kg  02/14/20 87.1 kg     Intake/Output Summary (Last 24 hours) at 03/05/2020 1406 Last data filed at 03/05/2020 0608 Gross per 24 hour  Intake --  Output 400 ml  Net -400 ml    Physical Exam:  Lying in bed comfortably confused to some degree Alert, only oriented to self, no distress not short of breath No new F.N deficits,  Swelling in upper extremities (Right arm > Left)with scattered bruising [place].AT, [place] dry CTA b no added sound abd soft nt nd no rebound Neuro intact moving limbs x4  I have personally reviewed the following:   Data Reviewed:  CBC Recent Labs  Lab 02/29/20 0708 03/01/20 0622 03/02/20 0609 03/03/20 0630 03/04/20 0516  WBC 11.4* 9.8 9.8 9.5 10.0   HGB 8.4* 8.0* 7.8* 7.4* 7.7*  HCT 28.2* 27.2* 25.4* 24.4* 26.4*  PLT 267 271 322 316 374  MCV 89.0 92.5 88.2 91.0 91.0  MCH 26.5 27.2 27.1 27.6 26.6  MCHC 29.8* 29.4* 30.7 30.3 29.2*  RDW 14.7 15.1 14.9 15.2 15.0    Chemistries  Recent Labs  Lab 03/01/20 0622 03/01/20 0622 03/02/20 0609 03/02/20 1438 03/03/20 0630 03/03/20 1352 03/04/20 0516  NA 147*   < > 152* 149* 146* 143 148*  K 3.5   < > 3.4* 3.3* 3.8 4.2 3.8  CL 116*   < > 115* 114* 113* 111 111  CO2 21*   < > 24 24 22 22 25   GLUCOSE 274*   < > 156* 147* 186* 402* 93  BUN 72*   < > 71* 68* 63* 63* 61*  CREATININE 2.87*   < > 2.73* 2.70* 2.64* 2.82* 2.55*  CALCIUM 8.2*   < > 8.4* 8.2* 7.8* 8.0* 8.3*  MG 2.3  --   --  2.5*  --   --   --    < > = values in this interval not displayed.   ------------------------------------------------------------------------------------------------------------------ No results for input(s): CHOL, HDL, LDLCALC, TRIG, CHOLHDL, LDLDIRECT in the last 72 hours.  Lab Results  Component Value Date   HGBA1C >15.5 (H) 02/22/2020   ------------------------------------------------------------------------------------------------------------------ No results for input(s): TSH, T4TOTAL, T3FREE, THYROIDAB in the last 72 hours.  Invalid input(s): FREET3 ------------------------------------------------------------------------------------------------------------------ No results for input(s): VITAMINB12, FOLATE, FERRITIN, TIBC, IRON, RETICCTPCT in the last 72 hours.  Coagulation profile No results for input(s): INR, PROTIME in the last 168 hours.  No results for input(s): DDIMER in the last 72 hours.  Cardiac Enzymes No results for input(s): CKMB, TROPONINI, MYOGLOBIN in the last 168 hours.  Invalid input(s): CK ------------------------------------------------------------------------------------------------------------------ No results found for: BNP  Micro Results Recent Results (from the  past 240 hour(s))  Culture, blood (routine x 2)     Status: None   Collection Time: 02/27/20  9:53 AM   Specimen: BLOOD RIGHT WRIST  Result Value Ref Range Status   Specimen Description BLOOD RIGHT WRIST  Final   Special Requests   Final    BOTTLES DRAWN AEROBIC AND ANAEROBIC Blood Culture adequate volume   Culture   Final    NO GROWTH 5 DAYS Performed at [facility], [address] [place], [place] [address]    Report Status 03/03/2020 FINAL  Final  Culture, blood (routine x 2)     Status: None   Collection Time: 02/27/20  9:54 AM   Specimen: BLOOD RIGHT FOREARM  Result Value Ref Range Status   Specimen Description BLOOD RIGHT FOREARM  Final   Special Requests   Final    BOTTLES DRAWN AEROBIC AND ANAEROBIC Blood Culture adequate volume   Culture   Final    NO GROWTH 5 DAYS Performed at [facility], [address], [place], [place] [address]    Report Status 03/03/2020 FINAL  Final  SARS Coronavirus 2 by RT PCR (hospital order, performed in [facility] hospital lab) Nasopharyngeal Nasopharyngeal Swab     Status: None   Collection Time: 03/04/20  4:42 PM   Specimen: Nasopharyngeal Swab  Result Value Ref Range Status   SARS Coronavirus 2 NEGATIVE NEGATIVE Final    Comment: (NOTE) SARS-CoV-2 target nucleic acids are NOT DETECTED.  The SARS-CoV-2 RNA is generally detectable in upper and lower respiratory specimens during the acute phase of infection. The lowest concentration of SARS-CoV-2 viral copies this assay can detect is 250 copies / mL. A negative result does not preclude SARS-CoV-2 infection and should not be used as the sole basis for treatment or other patient management decisions.  A negative result may occur with improper specimen collection / handling, submission of specimen other than nasopharyngeal swab, presence of viral mutation(s) within the areas targeted by this assay, and inadequate number of viral copies (<250 copies / mL). A negative result must be  combined with clinical observations, patient history, and epidemiological information.  Fact Sheet for Patients:   StrictlyIdeas.no  Fact Sheet for Healthcare Providers: BankingDealers.co.za  This test is not yet approved or  cleared by the [place] FDA and has been authorized for detection and/or diagnosis of SARS-CoV-2 by FDA under an Emergency Use Authorization (EUA).  This EUA will remain in effect (meaning this test can be used) for the duration of the COVID-19 declaration under Section 564(b)(1) of the Act, 21 U.S.C. section 360bbb-3(b)(1), unless the authorization is terminated or revoked sooner.  Performed  at [facility], [address] [place], [place] [address]     Radiology Reports DG Thoracic Spine W/Swimmers  Result Date: 02/14/2020 CLINICAL DATA:  Bilateral leg swelling.  No reported back symptoms EXAM: THORACIC SPINE - 3 VIEWS COMPARISON:  None. FINDINGS: Diffuse bridging osteophytes from spondylosis. Generalized disc narrowing. No evidence of fracture, endplate erosion, or focal bone lesion. Maintained posterior mediastinal fat planes when allowing for rotation. IMPRESSION: 1. Generalized spondylosis with multi-level bridging. 2. No acute finding. Electronically Signed   By: Monte Fantasia M.D.   On: 02/14/2020 08:12   DG Lumbar Spine Complete  Result Date: 02/14/2020 CLINICAL DATA:  Leg swelling.  No known injury.  Dementia. EXAM: LUMBAR SPINE - COMPLETE 4+ VIEW COMPARISON:  None. FINDINGS: Diffuse disc narrowing and bulky endplate spurring. Diffuse facet degenerative spurring. Multilevel ankylosis is likely present. No evidence of fracture, erosion or bone lesion. IMPRESSION: 1. No acute finding. 2. Advanced lumbar spine degeneration with multiple bridging osteophytes. Electronically Signed   By: Monte Fantasia M.D.   On: 02/14/2020 08:11   DG Pelvis 1-2 Views  Result Date: 02/14/2020 CLINICAL DATA:  Bilateral leg  swelling with no known injury or back pain EXAM: PELVIS - 1-2 VIEW COMPARISON:  August 25, 2017 FINDINGS: Bilateral hip degenerative changes. No signs of acute fracture or bone abnormality. Degenerative changes also noted incidentally in the lower lumbar spine. IMPRESSION: Bilateral hip degenerative changes. No signs of acute fracture. Electronically Signed   By: Zetta Bills M.D.   On: 02/14/2020 08:07   DG Ankle Complete Right  Result Date: 02/14/2020 CLINICAL DATA:  Bilateral foot pain and swelling. EXAM: RIGHT ANKLE - COMPLETE 3+ VIEW COMPARISON:  None. FINDINGS: Nonspecific soft tissue swelling. Profound osteopenia. No acute fracture or subluxation. Bilateral malleolar spurring from enthesophytes. IMPRESSION: 1. Soft tissue swelling without acute osseous finding. 2. Marked osteopenia. Electronically Signed   By: Monte Fantasia M.D.   On: 02/14/2020 07:17   DG Abd 1 View  Result Date: 03/01/2020 CLINICAL DATA:  Abdominal distension EXAM: ABDOMEN - 1 VIEW COMPARISON:  01/04/2006 FINDINGS: Scattered large and small bowel gas is noted. Contrast material is noted in the colon from a prior modified barium swallow. No free air is seen. No obstructive changes are noted. Degenerative changes of lumbar spine are seen. IMPRESSION: No acute abnormality noted. Electronically Signed   By: Inez Catalina M.D.   On: 03/01/2020 15:51   DG CHEST PORT 1 VIEW  Result Date: 02/26/2020 CLINICAL DATA:  Possible aspiration EXAM: PORTABLE CHEST 1 VIEW COMPARISON:  02/22/2020 FINDINGS: Cardiac shadow is stable. Aortic calcifications are again seen. The lungs are well aerated bilaterally. No focal infiltrate or sizable effusion is seen. No bony abnormality is noted. IMPRESSION: No acute abnormality seen.  No change from the prior exam. Electronically Signed   By: Inez Catalina M.D.   On: 02/26/2020 17:41   DG Chest Port 1 View  Result Date: 02/22/2020 CLINICAL DATA:  Sepsis EXAM: PORTABLE CHEST 1 VIEW COMPARISON:   February 14, 2020 FINDINGS: The heart size and mediastinal contours are within normal limits. Both lungs are clear. The visualized skeletal structures are unremarkable. IMPRESSION: No active disease. Electronically Signed   By: Abelardo Diesel M.D.   On: 02/22/2020 14:08   DG Chest Portable 1 View  Result Date: 02/14/2020 CLINICAL DATA:  Bilateral foot pain EXAM: PORTABLE CHEST 1 VIEW COMPARISON:  08/25/2017 FINDINGS: Normal heart size and stable aortic tortuosity. There is no edema, consolidation, effusion, or pneumothorax. Artifact  from EKG leads. Degenerative endplate spurring. IMPRESSION: No evidence of acute disease. Electronically Signed   By: Monte Fantasia M.D.   On: 02/14/2020 07:06   DG Swallowing Func-Speech Pathology  Result Date: 02/26/2020 Objective Swallowing Evaluation: Type of Study: MBS-Modified Barium Swallow Study  Patient Details Name: Paul Perez MRN: [id number] Date of Birth: 1927-11-08 Today's Date: 02/26/2020 Time: SLP Start Time (ACUTE ONLY): 1339 -SLP Stop Time (ACUTE ONLY): 1408 SLP Time Calculation (min) (ACUTE ONLY): 29 min Past Medical History: Past Medical History: Diagnosis Date . Dementia ([place])  . Diabetes mellitus without complication ([place])  . Encephalopathy  . Gastritis 2004 . GIB (gastrointestinal bleeding) 2004 . Hypertension  . Poor historian  . Sinus bradycardia seen on cardiac monitor  Past Surgical History: Past Surgical History: Procedure Laterality Date . INGUINAL HERNIA REPAIR  09/2010  Bilateral . UPPER GASTROINTESTINAL ENDOSCOPY  2004 HPI: This 86 y.o.malewith medical history significant ofDementia,DiabetesMellitus II, Hypertension, CKD stage IV, premature atrial contractions,history of GI bleed, presents in the emergency department with confusion and fever. This is patient's third ER visit In last three weeks.Last week he presented in the ED with generalized weakness and pain,found to have a UTI . He was discharged on Keflex. Daughter reportshe  has been having worsening pain and weakness, was not able to stand up in the morning. Patient is admitted for sepsis secondary to Pseudomonas bacteremia.  He was also found to have high anion gap metabolic acidosis secondary to DKA which has been resolved. He is transitioned to subcu insulin. BSE requested.  Subjective: "I want water!" Assessment / Plan / Recommendation CHL IP CLINICAL IMPRESSIONS 02/26/2020 Clinical Impression Pt presents with mild oropharyngeal sensorimotor dysphagia characterized by decreased laryngeal vestibule closure resulting in two episodes of trace silent aspiration of thin liquids via straw and consistent penetration of thin liquids and NTL during the swallow that is usually expelled with repeat swallow of solid textures. Decreased pharyngeal constriction resulting in inconsistent clearance of penetrates however despite remaining in the airway, they were only visualized dropping to and below the cords twice as mentioned above. Solid textures were consumed without incident and the barium tablet was swallowed with thin liquids with noted brief stasis in the pyriforms but quickly passed through. RN was present for MBS and BP and HR were stable throughout procedure. Recommend D3/mech soft and Nectar thick liquids; recommend meds whole or crushed in puree as Pt is able. Also recommend free water protocol, * single ice chips after oral care in between meals *. ST will continue to follow acutely. SLP Visit Diagnosis Dysphagia, unspecified (R13.10) Attention and concentration deficit following -- Frontal lobe and executive function deficit following -- Impact on safety and function Mild aspiration risk;Moderate aspiration risk   CHL IP TREATMENT RECOMMENDATION 02/26/2020 Treatment Recommendations Therapy as outlined in treatment plan below   Prognosis 02/26/2020 Prognosis for Safe Diet Advancement Fair Barriers to Reach Goals Cognitive deficits Barriers/Prognosis Comment -- CHL IP DIET  RECOMMENDATION 02/26/2020 SLP Diet Recommendations Dysphagia 3 (Mech soft) solids;Nectar thick liquid Liquid Administration via Cup;No straw Medication Administration Whole meds with puree Compensations Slow rate;Small sips/bites Postural Changes Remain semi-upright after after feeds/meals (Comment);Seated upright at 90 degrees   CHL IP OTHER RECOMMENDATIONS 02/26/2020 Recommended Consults -- Oral Care Recommendations Oral care BID Other Recommendations Order thickener from pharmacy   CHL IP FOLLOW UP RECOMMENDATIONS 02/26/2020 Follow up Recommendations 24 hour supervision/assistance   CHL IP FREQUENCY AND DURATION 02/26/2020 Speech Therapy Frequency (ACUTE ONLY) min 2x/week Treatment Duration 1 week  CHL IP ORAL PHASE 02/26/2020 Oral Phase WFL Oral - Pudding Teaspoon -- Oral - Pudding Cup -- Oral - Honey Teaspoon -- Oral - Honey Cup -- Oral - Nectar Teaspoon -- Oral - Nectar Cup -- Oral - Nectar Straw -- Oral - Thin Teaspoon -- Oral - Thin Cup -- Oral - Thin Straw -- Oral - Puree -- Oral - Mech Soft -- Oral - Regular -- Oral - Multi-Consistency -- Oral - Pill -- Oral Phase - Comment --  CHL IP PHARYNGEAL PHASE 02/26/2020 Pharyngeal Phase Impaired Pharyngeal- Pudding Teaspoon -- Pharyngeal -- Pharyngeal- Pudding Cup -- Pharyngeal -- Pharyngeal- Honey Teaspoon Pharyngeal residue - valleculae;Pharyngeal residue - pyriform;Reduced pharyngeal peristalsis Pharyngeal -- Pharyngeal- Honey Cup -- Pharyngeal -- Pharyngeal- Nectar Teaspoon Reduced pharyngeal peristalsis;Reduced airway/laryngeal closure;Penetration/Aspiration during swallow;Pharyngeal residue - valleculae;Pharyngeal residue - pyriform Pharyngeal Material enters airway, remains ABOVE vocal cords then ejected out Pharyngeal- Nectar Cup Reduced pharyngeal peristalsis;Reduced airway/laryngeal closure;Penetration/Aspiration during swallow;Pharyngeal residue - valleculae;Pharyngeal residue - pyriform Pharyngeal Material enters airway, remains ABOVE vocal cords  then ejected out Pharyngeal- Nectar Straw -- Pharyngeal -- Pharyngeal- Thin Teaspoon Delayed swallow initiation-pyriform sinuses;Reduced pharyngeal peristalsis;Reduced epiglottic inversion;Reduced anterior laryngeal mobility;Reduced laryngeal elevation;Reduced airway/laryngeal closure;Reduced tongue base retraction;Penetration/Aspiration during swallow;Pharyngeal residue - valleculae;Pharyngeal residue - pyriform Pharyngeal Material enters airway, remains ABOVE vocal cords and not ejected out Pharyngeal- Thin Cup Delayed swallow initiation-pyriform sinuses;Reduced pharyngeal peristalsis;Reduced epiglottic inversion;Reduced anterior laryngeal mobility;Reduced laryngeal elevation;Reduced airway/laryngeal closure;Reduced tongue base retraction;Penetration/Aspiration during swallow;Pharyngeal residue - valleculae;Pharyngeal residue - pyriform Pharyngeal Material enters airway, remains ABOVE vocal cords and not ejected out Pharyngeal- Thin Straw Delayed swallow initiation-pyriform sinuses;Reduced pharyngeal peristalsis;Reduced epiglottic inversion;Reduced anterior laryngeal mobility;Reduced laryngeal elevation;Reduced airway/laryngeal closure;Reduced tongue base retraction;Penetration/Aspiration during swallow;Pharyngeal residue - valleculae;Pharyngeal residue - pyriform Pharyngeal Material enters airway, passes BELOW cords without attempt by patient to eject out (silent aspiration) Pharyngeal- Puree WFL Pharyngeal -- Pharyngeal- Mechanical Soft -- Pharyngeal -- Pharyngeal- Regular WFL Pharyngeal -- Pharyngeal- Multi-consistency -- Pharyngeal -- Pharyngeal- Pill Pharyngeal residue - cp segment;Pharyngeal residue - pyriform Pharyngeal -- Pharyngeal Comment --  CHL IP CERVICAL ESOPHAGEAL PHASE 02/26/2020 Cervical Esophageal Phase WFL Pudding Teaspoon -- Pudding Cup -- Honey Teaspoon -- Honey Cup -- Nectar Teaspoon -- Nectar Cup -- Nectar Straw -- Thin Teaspoon -- Thin Cup -- Thin Straw -- Puree -- Mechanical Soft --  Regular -- Multi-consistency -- Pill -- Cervical Esophageal Comment -- Amelia H. Roddie Mc, CCC-SLP Speech Language Pathologist Wende Bushy 02/26/2020, 4:49 PM              US Abdomen Limited RUQ  Result Date: 02/23/2020 CLINICAL DATA:  Elevated liver enzymes EXAM: ULTRASOUND ABDOMEN LIMITED RIGHT UPPER QUADRANT COMPARISON:  August 25, 2017 FINDINGS: Gallbladder: No wall thickening visualized. Gallstones are identified. 0.3 cm polyp is identified. No sonographic Murphy sign noted by sonographer. Common bile duct: Diameter: 0.3 mm Liver: 1.5 x 1.1 x 1.4 cm cyst is identified in the left lobe liver. Within normal limits in parenchymal echogenicity. Portal vein is patent on color Doppler imaging with normal direction of blood flow towards the liver. Other: None. IMPRESSION: 1. Cholelithiasis without sonographic evidence of acute cholecystitis. 2. 1.5 cm cyst in the left lobe liver. Electronically Signed   By: Abelardo Diesel M.D.   On: 02/23/2020 14:49   Time Spent in minutes  20  Nita Sells M.D on 03/05/2020 at 2:06 PM  To page go to [email or web address] - password [facility]

## 2020-03-05 NOTE — TOC Progression Note (Signed)
Transition of Care [facility]) - Progression Note    Patient Details  Name: CAREEM YASUI MRN: [id number] Date of Birth: Feb 22, 1928  [facility] CM/SW Contact  Natasha Bence, LCSW Phone Number: 03/05/2020, 1:11 PM  Clinical Narrative:    CSW contacted Debbie with [employer] to inquire about auth for patient. Debbie reported that Josem Kaufmann would need to be restarted because the approval had expired. Debbie requested updated progress note and PT eval. CSW faxed progress note and PT eval. TOC to follow   Expected Discharge Plan: [place] Barriers to Discharge: Continued Medical Work up  Expected Discharge Plan and Services Expected Discharge Plan: [place] arrangements for the past 2 months: [place]                                       Social Determinants of Health (SDOH) Interventions    Readmission Risk Interventions No flowsheet data found.

## 2020-03-05 NOTE — Progress Notes (Signed)
Patient medically stable for d/c to SNF whenever able to discharge.  Full note to follow

## 2020-03-05 NOTE — Progress Notes (Signed)
Physical Therapy Treatment Patient Details Name: Paul Perez MRN: [id number] DOB: 20-Feb-1928 Today's Date: 03/05/2020    History of Present Illness Paul Perez is a 86 y.o. male with medical history significant of Dementia, Diabetes Mellitus II,  Hypertension, CKD stage IV, premature atrial contractions , history of GI bleed, presents in the emergency department with confusion and fever. History is obtained from ED chart. Patient is demented,  unable to provide history.  History is also obtained from daughter [name] over the phone.  This is patient's third ER visit  In last three weeks.  Last week he presented in the ED with generalized weakness and pain,  found to have a UTI . He was discharged on Keflex.  Daughter reports he has been having worsening pain and weakness, was not able to stand up in the morning.  He was brought in the emergency department the morning.    PT Comments    Patient presents in bed with legs crossed and c/o severe pain when uncrossing legs and moving during supine to sitting.  Patient tends to fall backwards upon sitting up and required frequent verbal/tactile cueing to lean forward and after a few minutes able to keep trunk in midline without posterior lean with occasional supporting of BUE.  Patient tolerated sitting up at bedside for approximately 15 minutes, but unable to attempt sit to stands due to limited bilateral knee flexion and c/o severe pain when attempting to flex knees.  Patient able to hold onto bed rail during sit to supine, but required max assist to move BLE back onto bed.  Patient will benefit from continued physical therapy in hospital and recommended venue below to increase strength, balance, endurance for safe ADLs and gait.    Follow Up Recommendations  SNF     Equipment Recommendations  None recommended by PT    Recommendations for Other Services       Precautions / Restrictions Precautions Precautions:  Fall Restrictions Weight Bearing Restrictions: No    Mobility  Bed Mobility Overal bed mobility: Needs Assistance Bed Mobility: Supine to Sit;Sit to Supine     Supine to sit: Max assist Sit to supine: Max assist   General bed mobility comments: slow labored movement requiring Max assist to move BLE  Transfers                    Ambulation/Gait                 Stairs             Wheelchair Mobility    Modified Rankin (Stroke Patients Only)       Balance Overall balance assessment: Needs assistance Sitting-balance support: Feet supported;No upper extremity supported Sitting balance-Leahy Scale: Fair Sitting balance - Comments: fair/good with BUE support Postural control: Posterior lean                                  Cognition Arousal/Alertness: Awake/alert Behavior During Therapy: Flat affect;Anxious Overall Cognitive Status: History of cognitive impairments - at baseline                                        Exercises      General Comments        Pertinent Vitals/Pain Pain Assessment: Faces Faces Pain Scale: Hurts little  more Pain Location: BLE with pressure Pain Descriptors / Indicators: Grimacing;Guarding Pain Intervention(s): Limited activity within patient's tolerance;Monitored during session;Repositioned    Home Living                      Prior Function            PT Goals (current goals can now be found in the care plan section) Acute Rehab PT Goals Patient Stated Goal: not stated PT Goal Formulation: With patient Time For Goal Achievement: 03/11/20 Potential to Achieve Goals: Fair Progress towards PT goals: Progressing toward goals    Frequency    Min 3X/week      PT Plan Current plan remains appropriate    Co-evaluation              AM-PAC PT "6 Clicks" Mobility   Outcome Measure  Help needed turning from your back to your side while in a flat bed  without using bedrails?: A Lot Help needed moving from lying on your back to sitting on the side of a flat bed without using bedrails?: A Lot Help needed moving to and from a bed to a chair (including a wheelchair)?: Total Help needed standing up from a chair using your arms (e.g., wheelchair or bedside chair)?: Total Help needed to walk in hospital room?: Total Help needed climbing 3-5 steps with a railing? : Total 6 Click Score: 8    End of Session   Activity Tolerance: Patient tolerated treatment well;Patient limited by fatigue;Patient limited by pain Patient left: in bed;with call bell/phone within reach Nurse Communication: Mobility status PT Visit Diagnosis: Unsteadiness on feet (R26.81);Other abnormalities of gait and mobility (R26.89);Muscle weakness (generalized) (M62.81)     Time: 1884-1660 PT Time Calculation (min) (ACUTE ONLY): 25 min  Charges:  $Therapeutic Activity: 23-37 mins                     12:37 PM, 03/05/20 Lonell Grandchild, MPT Physical Therapist with [facility] [phone] office [phone] mobile phone

## 2020-03-06 DIAGNOSIS — R6520 Severe sepsis without septic shock: Secondary | ICD-10-CM | POA: Diagnosis not present

## 2020-03-06 DIAGNOSIS — A419 Sepsis, unspecified organism: Secondary | ICD-10-CM | POA: Diagnosis not present

## 2020-03-06 LAB — RENAL FUNCTION PANEL
Albumin: 2.3 g/dL — ABNORMAL LOW (ref 3.5–5.0)
Anion gap: 7 (ref 5–15)
BUN: 49 mg/dL — ABNORMAL HIGH (ref 8–23)
CO2: 26 mmol/L (ref 22–32)
Calcium: 8.1 mg/dL — ABNORMAL LOW (ref 8.9–10.3)
Chloride: 113 mmol/L — ABNORMAL HIGH (ref 98–111)
Creatinine, Ser: 2.11 mg/dL — ABNORMAL HIGH (ref 0.61–1.24)
GFR, Estimated: 29 mL/min — ABNORMAL LOW (ref >60)
Glucose, Bld: 88 mg/dL (ref 70–99)
Phosphorus: 3.1 mg/dL (ref 2.5–4.6)
Potassium: 3.9 mmol/L (ref 3.5–5.1)
Sodium: 146 mmol/L — ABNORMAL HIGH (ref 135–145)

## 2020-03-06 LAB — GLUCOSE, CAPILLARY
Glucose-Capillary: 71 mg/dL (ref 70–99)
Glucose-Capillary: 74 mg/dL (ref 70–99)
Glucose-Capillary: 149 mg/dL — ABNORMAL HIGH (ref 70–99)

## 2020-03-06 MED ORDER — AMLODIPINE BESYLATE 5 MG PO TABS
5.0000 mg | ORAL_TABLET | Freq: Every day | ORAL | Status: DC
Start: 2020-03-06 — End: 2020-03-26

## 2020-03-06 MED ORDER — STARCH (THICKENING) PO POWD
1.0000 | ORAL | 0 refills | Status: DC | PRN
Start: 2020-03-06 — End: 2020-03-26

## 2020-03-06 MED ORDER — METOPROLOL TARTRATE 25 MG PO TABS
25.0000 mg | ORAL_TABLET | Freq: Two times a day (BID) | ORAL | Status: DC
Start: 2020-03-06 — End: 2020-03-26

## 2020-03-06 MED ORDER — MELATONIN 3 MG PO TABS: 3.0000 mg | ORAL_TABLET | Freq: Every evening | ORAL | 0 refills | Status: DC | PRN

## 2020-03-06 MED ORDER — CLONIDINE 0.1 MG/24HR TD PTWK: 0.1000 mg | MEDICATED_PATCH | TRANSDERMAL | 12 refills | Status: DC

## 2020-03-06 MED ORDER — QUETIAPINE FUMARATE 25 MG PO TABS: 12.5000 mg | ORAL_TABLET | Freq: Every day | ORAL | 0 refills | Status: DC

## 2020-03-06 MED ORDER — SENNA 8.6 MG PO TABS
1.0000 | ORAL_TABLET | Freq: Two times a day (BID) | ORAL | 0 refills | Status: DC
Start: 2020-03-06 — End: 2020-03-26

## 2020-03-06 MED ORDER — BUSPIRONE HCL 5 MG PO TABS
5.0000 mg | ORAL_TABLET | Freq: Two times a day (BID) | ORAL | 0 refills | Status: DC
Start: 2020-03-06 — End: 2020-03-26

## 2020-03-06 NOTE — Discharge Summary (Signed)
Physician Discharge Summary  Paul Perez HKV:[id number] DOB: 04-26-1928 DOA: 02/22/2020  PCP: Alanson Puls [facility] [place] date: 02/22/2020 Discharge date: 03/06/2020  Time spent: 35 minutes  Recommendations for Outpatient Follow-up:  1. Patient will require ongoing discussions about goals of care in the outpatient setting at skilled facility ongoing prognosis and goals 2. Recommend periodic labs every week 3. Recommend frequent turning as does have stage II decubitus on right buttocks 4. Recommend de-escalation off of Actos if felt feasible by primary physician does have hyperglycemia at times but may only need as needed treatments as the appetite is poor and would recommend palliative care seeing the patient and discuss with family  Discharge Diagnoses:  Active Problems:   Altered mental state   Hypertension   Bradycardia   Hypernatremia   Diabetes mellitus type 2, uncontrolled, with complications (HCC)   Dementia (HCC)   CKD (chronic kidney disease) stage 4, GFR 15-29 ml/min (HCC)   AF (paroxysmal atrial fibrillation) (HCC)   Bacteremia due to Pseudomonas   Oropharyngeal dysphagia   Abdominal distension   Palliative care by specialist   Goals of care, counseling/discussion   DNR (do not resuscitate)   Discharge Condition: Guarded  Diet recommendation: Heart healthy diabetic dysphagia 3 diet  Filed Weights   02/26/20 0404 02/27/20 0500 02/28/20 0029  Weight: 86.9 kg 86.5 kg 83.1 kg    History of present illness:  86 year old black male 86 known DM TY 2 on oral meds, HTN, CKD 4, PACs Recent Rx at nursing facilit UTI but found on admission 10/9 to have Pseudomonas bacteremia Found to be septic T-max 101.8 tachycardic white count 8 sodium 127 slightly elevated LFTs 154/55 lactic acid 5 Eventually found to have sepsis as above and treated Also developed DKA this hospital stay Palliative care saw the patient   Hospital Course:  Severe sepsis secondary to  Pseudomonas bacteremia Completed 7 days of IV cefepime and repeat blood cultures were done that were unremarkable  Mild oropharyngeal dysphagia Barium swallow 10/13 unremarkable and recommended dysphagia 3 diet He is eating poorly and may need to have medications adjusted in addition to blood sugar control     DKA this admission Vacillating blood sugars with brittle control A1c greater than 15 He is not eating a lot so recommendation was to discontinue all insulins that he was given in the hospital and may be just go home on Actos If he fails to thrive or does not eat well, may require goals of care and may require discontinuation of all glycemic control  Mood disorder Stable but in the setting of dementia Continued BuSpar, sertraline and given prescription for same on discharge  Cholelithiasis? He had elevated liver enzymes which resolved-he had nonobstructive stones on ultrasound 02/23/2020 and was a poor surgical candidate Outpatient follow-up  Encephalopathy in the setting of dementia He was kept on delirium precautions Aricept was continued goals of care were sought and patient is now a DNR He will be discharging to skilled facility with palliative following and if he fails to thrive it is recommended strongly that palliative care follow him and transition to hospice   Procedures:  Barium swallow (i.e. Studies not automatically included, echos, thoracentesis, etc; not x-rays)  Consultations:  None  Discharge Exam: Vitals:   03/06/20 0545 03/06/20 0732  BP: (!) 184/69 (!) 173/61  Pulse: 62 (!) 58  Resp: 18   Temp: 98.8 F (37.1 C) 98.4 F (36.9 C)  SpO2: 99% 100%    General: Awake alert  coherent no distress EOMI NCAT no focal deficit no icterus no pallor Cardiovascular: S1-S2 no murmur no rub no gallop Respiratory: Clear no rales no rhonchi Skin on sacrum on left buttock shows stage II decubitus with breakdown Neurologically intact Psych slightly confused but  pleasantly so cannot really orient to time place  Discharge Instructions    Allergies as of 03/06/2020   No Known Allergies     Medication List    STOP taking these medications   cloNIDine 0.2 MG tablet Commonly known as: CATAPRES   furosemide 40 MG tablet Commonly known as: LASIX   hydrALAZINE 100 MG tablet Commonly known as: APRESOLINE     TAKE these medications   amLODipine 5 MG tablet Commonly known as: NORVASC Take 1 tablet (5 mg total) by mouth daily.   busPIRone 5 MG tablet Commonly known as: BUSPAR Take 1 tablet (5 mg total) by mouth 2 (two) times daily.   cloNIDine 0.1 mg/24hr patch Commonly known as: CATAPRES - Dosed in mg/24 hr Place 1 patch (0.1 mg total) onto the skin once a week. Start taking on: March 08, 2020   donepezil 5 MG tablet Commonly known as: ARICEPT Take 5 mg by mouth at bedtime.   food thickener Powd Commonly known as: THICK IT Take 1 Container by mouth as needed (as needed to thicken liquids).   iron polysaccharides 150 MG capsule Commonly known as: NIFEREX Take 150 mg by mouth daily.   melatonin 3 MG Tabs tablet Take 1 tablet (3 mg total) by mouth at bedtime as needed (insomnia).   metoprolol tartrate 25 MG tablet Commonly known as: LOPRESSOR Take 1 tablet (25 mg total) by mouth 2 (two) times daily.   pioglitazone 15 MG tablet Commonly known as: ACTOS Take 1 tablet by mouth daily.   polyethylene glycol 17 g packet Commonly known as: MIRALAX / GLYCOLAX Take 17 g by mouth daily as needed for mild constipation.   QUEtiapine 25 MG tablet Commonly known as: SEROQUEL Take 0.5 tablets (12.5 mg total) by mouth daily.   senna 8.6 MG Tabs tablet Commonly known as: SENOKOT Take 1 tablet (8.6 mg total) by mouth 2 (two) times daily.   Venelex Oint Apply liberal amount to coccyx, sacrum and bilateral buttocks q shift and prn after incontinence episodes for skin protection   Vitamin D (Cholecalciferol) 25 MCG (1000 UT)  Tabs Take 1,000 Units by mouth daily.      No Known Allergies  Contact information for after-discharge care    [facility] .   Service: Skilled Nursing Contact information: [address] [place] [phone]                   The results of significant diagnostics from this hospitalization (including imaging, microbiology, ancillary and laboratory) are listed below for reference.    Significant Diagnostic Studies: DG Thoracic Spine W/Swimmers  Result Date: 02/14/2020 CLINICAL DATA:  Bilateral leg swelling.  No reported back symptoms EXAM: THORACIC SPINE - 3 VIEWS COMPARISON:  None. FINDINGS: Diffuse bridging osteophytes from spondylosis. Generalized disc narrowing. No evidence of fracture, endplate erosion, or focal bone lesion. Maintained posterior mediastinal fat planes when allowing for rotation. IMPRESSION: 1. Generalized spondylosis with multi-level bridging. 2. No acute finding. Electronically Signed   By: Monte Fantasia M.D.   On: 02/14/2020 08:12   DG Lumbar Spine Complete  Result Date: 02/14/2020 CLINICAL DATA:  Leg swelling.  No known injury.  Dementia.  EXAM: LUMBAR SPINE - COMPLETE 4+ VIEW COMPARISON:  None. FINDINGS: Diffuse disc narrowing and bulky endplate spurring. Diffuse facet degenerative spurring. Multilevel ankylosis is likely present. No evidence of fracture, erosion or bone lesion. IMPRESSION: 1. No acute finding. 2. Advanced lumbar spine degeneration with multiple bridging osteophytes. Electronically Signed   By: Monte Fantasia M.D.   On: 02/14/2020 08:11   DG Pelvis 1-2 Views  Result Date: 02/14/2020 CLINICAL DATA:  Bilateral leg swelling with no known injury or back pain EXAM: PELVIS - 1-2 VIEW COMPARISON:  August 25, 2017 FINDINGS: Bilateral hip degenerative changes. No signs of acute fracture or bone abnormality. Degenerative changes also noted incidentally in the lower lumbar  spine. IMPRESSION: Bilateral hip degenerative changes. No signs of acute fracture. Electronically Signed   By: Zetta Bills M.D.   On: 02/14/2020 08:07   DG Ankle Complete Right  Result Date: 02/14/2020 CLINICAL DATA:  Bilateral foot pain and swelling. EXAM: RIGHT ANKLE - COMPLETE 3+ VIEW COMPARISON:  None. FINDINGS: Nonspecific soft tissue swelling. Profound osteopenia. No acute fracture or subluxation. Bilateral malleolar spurring from enthesophytes. IMPRESSION: 1. Soft tissue swelling without acute osseous finding. 2. Marked osteopenia. Electronically Signed   By: Monte Fantasia M.D.   On: 02/14/2020 07:17   DG Abd 1 View  Result Date: 03/01/2020 CLINICAL DATA:  Abdominal distension EXAM: ABDOMEN - 1 VIEW COMPARISON:  01/04/2006 FINDINGS: Scattered large and small bowel gas is noted. Contrast material is noted in the colon from a prior modified barium swallow. No free air is seen. No obstructive changes are noted. Degenerative changes of lumbar spine are seen. IMPRESSION: No acute abnormality noted. Electronically Signed   By: Inez Catalina M.D.   On: 03/01/2020 15:51   DG CHEST PORT 1 VIEW  Result Date: 02/26/2020 CLINICAL DATA:  Possible aspiration EXAM: PORTABLE CHEST 1 VIEW COMPARISON:  02/22/2020 FINDINGS: Cardiac shadow is stable. Aortic calcifications are again seen. The lungs are well aerated bilaterally. No focal infiltrate or sizable effusion is seen. No bony abnormality is noted. IMPRESSION: No acute abnormality seen.  No change from the prior exam. Electronically Signed   By: Inez Catalina M.D.   On: 02/26/2020 17:41   DG Chest Port 1 View  Result Date: 02/22/2020 CLINICAL DATA:  Sepsis EXAM: PORTABLE CHEST 1 VIEW COMPARISON:  February 14, 2020 FINDINGS: The heart size and mediastinal contours are within normal limits. Both lungs are clear. The visualized skeletal structures are unremarkable. IMPRESSION: No active disease. Electronically Signed   By: Abelardo Diesel M.D.   On:  02/22/2020 14:08   DG Chest Portable 1 View  Result Date: 02/14/2020 CLINICAL DATA:  Bilateral foot pain EXAM: PORTABLE CHEST 1 VIEW COMPARISON:  08/25/2017 FINDINGS: Normal heart size and stable aortic tortuosity. There is no edema, consolidation, effusion, or pneumothorax. Artifact from EKG leads. Degenerative endplate spurring. IMPRESSION: No evidence of acute disease. Electronically Signed   By: Monte Fantasia M.D.   On: 02/14/2020 07:06   DG Swallowing Func-Speech Pathology  Result Date: 02/26/2020 Objective Swallowing Evaluation: Type of Study: MBS-Modified Barium Swallow Study  Patient Details Name: Paul Perez MRN: [id number] Date of Birth: November 23, 1927 Today's Date: 02/26/2020 Time: SLP Start Time (ACUTE ONLY): 1339 -SLP Stop Time (ACUTE ONLY): 1408 SLP Time Calculation (min) (ACUTE ONLY): 29 min Past Medical History: Past Medical History: Diagnosis Date . Dementia ([place])  . Diabetes mellitus without complication ([place])  . Encephalopathy  . Gastritis 2004 . GIB (gastrointestinal bleeding) 2004 . Hypertension  . Poor  historian  . Sinus bradycardia seen on cardiac monitor  Past Surgical History: Past Surgical History: Procedure Laterality Date . INGUINAL HERNIA REPAIR  09/2010  Bilateral . UPPER GASTROINTESTINAL ENDOSCOPY  2004 HPI: This 86 y.o.malewith medical history significant ofDementia,DiabetesMellitus II, Hypertension, CKD stage IV, premature atrial contractions,history of GI bleed, presents in the emergency department with confusion and fever. This is patient's third ER visit In last three weeks.Last week he presented in the ED with generalized weakness and pain,found to have a UTI . He was discharged on Keflex. Daughter reportshe has been having worsening pain and weakness, was not able to stand up in the morning. Patient is admitted for sepsis secondary to Pseudomonas bacteremia.  He was also found to have high anion gap metabolic acidosis secondary to DKA which has been  resolved. He is transitioned to subcu insulin. BSE requested.  Subjective: "I want water!" Assessment / Plan / Recommendation CHL IP CLINICAL IMPRESSIONS 02/26/2020 Clinical Impression Pt presents with mild oropharyngeal sensorimotor dysphagia characterized by decreased laryngeal vestibule closure resulting in two episodes of trace silent aspiration of thin liquids via straw and consistent penetration of thin liquids and NTL during the swallow that is usually expelled with repeat swallow of solid textures. Decreased pharyngeal constriction resulting in inconsistent clearance of penetrates however despite remaining in the airway, they were only visualized dropping to and below the cords twice as mentioned above. Solid textures were consumed without incident and the barium tablet was swallowed with thin liquids with noted brief stasis in the pyriforms but quickly passed through. RN was present for MBS and BP and HR were stable throughout procedure. Recommend D3/mech soft and Nectar thick liquids; recommend meds whole or crushed in puree as Pt is able. Also recommend free water protocol, * single ice chips after oral care in between meals *. ST will continue to follow acutely. SLP Visit Diagnosis Dysphagia, unspecified (R13.10) Attention and concentration deficit following -- Frontal lobe and executive function deficit following -- Impact on safety and function Mild aspiration risk;Moderate aspiration risk   CHL IP TREATMENT RECOMMENDATION 02/26/2020 Treatment Recommendations Therapy as outlined in treatment plan below   Prognosis 02/26/2020 Prognosis for Safe Diet Advancement Fair Barriers to Reach Goals Cognitive deficits Barriers/Prognosis Comment -- CHL IP DIET RECOMMENDATION 02/26/2020 SLP Diet Recommendations Dysphagia 3 (Mech soft) solids;Nectar thick liquid Liquid Administration via Cup;No straw Medication Administration Whole meds with puree Compensations Slow rate;Small sips/bites Postural Changes Remain  semi-upright after after feeds/meals (Comment);Seated upright at 90 degrees   CHL IP OTHER RECOMMENDATIONS 02/26/2020 Recommended Consults -- Oral Care Recommendations Oral care BID Other Recommendations Order thickener from pharmacy   CHL IP FOLLOW UP RECOMMENDATIONS 02/26/2020 Follow up Recommendations 24 hour supervision/assistance   CHL IP FREQUENCY AND DURATION 02/26/2020 Speech Therapy Frequency (ACUTE ONLY) min 2x/week Treatment Duration 1 week      CHL IP ORAL PHASE 02/26/2020 Oral Phase WFL Oral - Pudding Teaspoon -- Oral - Pudding Cup -- Oral - Honey Teaspoon -- Oral - Honey Cup -- Oral - Nectar Teaspoon -- Oral - Nectar Cup -- Oral - Nectar Straw -- Oral - Thin Teaspoon -- Oral - Thin Cup -- Oral - Thin Straw -- Oral - Puree -- Oral - Mech Soft -- Oral - Regular -- Oral - Multi-Consistency -- Oral - Pill -- Oral Phase - Comment --  CHL IP PHARYNGEAL PHASE 02/26/2020 Pharyngeal Phase Impaired Pharyngeal- Pudding Teaspoon -- Pharyngeal -- Pharyngeal- Pudding Cup -- Pharyngeal -- Pharyngeal- Honey Teaspoon Pharyngeal residue - valleculae;Pharyngeal residue -  pyriform;Reduced pharyngeal peristalsis Pharyngeal -- Pharyngeal- Honey Cup -- Pharyngeal -- Pharyngeal- Nectar Teaspoon Reduced pharyngeal peristalsis;Reduced airway/laryngeal closure;Penetration/Aspiration during swallow;Pharyngeal residue - valleculae;Pharyngeal residue - pyriform Pharyngeal Material enters airway, remains ABOVE vocal cords then ejected out Pharyngeal- Nectar Cup Reduced pharyngeal peristalsis;Reduced airway/laryngeal closure;Penetration/Aspiration during swallow;Pharyngeal residue - valleculae;Pharyngeal residue - pyriform Pharyngeal Material enters airway, remains ABOVE vocal cords then ejected out Pharyngeal- Nectar Straw -- Pharyngeal -- Pharyngeal- Thin Teaspoon Delayed swallow initiation-pyriform sinuses;Reduced pharyngeal peristalsis;Reduced epiglottic inversion;Reduced anterior laryngeal mobility;Reduced laryngeal  elevation;Reduced airway/laryngeal closure;Reduced tongue base retraction;Penetration/Aspiration during swallow;Pharyngeal residue - valleculae;Pharyngeal residue - pyriform Pharyngeal Material enters airway, remains ABOVE vocal cords and not ejected out Pharyngeal- Thin Cup Delayed swallow initiation-pyriform sinuses;Reduced pharyngeal peristalsis;Reduced epiglottic inversion;Reduced anterior laryngeal mobility;Reduced laryngeal elevation;Reduced airway/laryngeal closure;Reduced tongue base retraction;Penetration/Aspiration during swallow;Pharyngeal residue - valleculae;Pharyngeal residue - pyriform Pharyngeal Material enters airway, remains ABOVE vocal cords and not ejected out Pharyngeal- Thin Straw Delayed swallow initiation-pyriform sinuses;Reduced pharyngeal peristalsis;Reduced epiglottic inversion;Reduced anterior laryngeal mobility;Reduced laryngeal elevation;Reduced airway/laryngeal closure;Reduced tongue base retraction;Penetration/Aspiration during swallow;Pharyngeal residue - valleculae;Pharyngeal residue - pyriform Pharyngeal Material enters airway, passes BELOW cords without attempt by patient to eject out (silent aspiration) Pharyngeal- Puree WFL Pharyngeal -- Pharyngeal- Mechanical Soft -- Pharyngeal -- Pharyngeal- Regular WFL Pharyngeal -- Pharyngeal- Multi-consistency -- Pharyngeal -- Pharyngeal- Pill Pharyngeal residue - cp segment;Pharyngeal residue - pyriform Pharyngeal -- Pharyngeal Comment --  CHL IP CERVICAL ESOPHAGEAL PHASE 02/26/2020 Cervical Esophageal Phase WFL Pudding Teaspoon -- Pudding Cup -- Honey Teaspoon -- Honey Cup -- Nectar Teaspoon -- Nectar Cup -- Nectar Straw -- Thin Teaspoon -- Thin Cup -- Thin Straw -- Puree -- Mechanical Soft -- Regular -- Multi-consistency -- Pill -- Cervical Esophageal Comment -- Amelia H. Roddie Mc, CCC-SLP Speech Language Pathologist Wende Bushy 02/26/2020, 4:49 PM              US Abdomen Limited RUQ  Result Date: 02/23/2020 CLINICAL  DATA:  Elevated liver enzymes EXAM: ULTRASOUND ABDOMEN LIMITED RIGHT UPPER QUADRANT COMPARISON:  August 25, 2017 FINDINGS: Gallbladder: No wall thickening visualized. Gallstones are identified. 0.3 cm polyp is identified. No sonographic Murphy sign noted by sonographer. Common bile duct: Diameter: 0.3 mm Liver: 1.5 x 1.1 x 1.4 cm cyst is identified in the left lobe liver. Within normal limits in parenchymal echogenicity. Portal vein is patent on color Doppler imaging with normal direction of blood flow towards the liver. Other: None. IMPRESSION: 1. Cholelithiasis without sonographic evidence of acute cholecystitis. 2. 1.5 cm cyst in the left lobe liver. Electronically Signed   By: Abelardo Diesel M.D.   On: 02/23/2020 14:49    Microbiology: Recent Results (from the past 240 hour(s))  Culture, blood (routine x 2)     Status: None   Collection Time: 02/27/20  9:53 AM   Specimen: BLOOD RIGHT WRIST  Result Value Ref Range Status   Specimen Description BLOOD RIGHT WRIST  Final   Special Requests   Final    BOTTLES DRAWN AEROBIC AND ANAEROBIC Blood Culture adequate volume   Culture   Final    NO GROWTH 5 DAYS Performed at [facility], [address], [place], [place] [address]    Report Status 03/03/2020 FINAL  Final  Culture, blood (routine x 2)     Status: None   Collection Time: 02/27/20  9:54 AM   Specimen: BLOOD RIGHT FOREARM  Result Value Ref Range Status   Specimen Description BLOOD RIGHT FOREARM  Final   Special Requests   Final    BOTTLES  DRAWN AEROBIC AND ANAEROBIC Blood Culture adequate volume   Culture   Final    NO GROWTH 5 DAYS Performed at [facility], [address] [place], [place] [address]    Report Status 03/03/2020 FINAL  Final  SARS Coronavirus 2 by RT PCR (hospital order, performed in [facility] hospital lab) Nasopharyngeal Nasopharyngeal Swab     Status: None   Collection Time: 03/04/20  4:42 PM   Specimen: Nasopharyngeal Swab  Result Value Ref Range Status   SARS  Coronavirus 2 NEGATIVE NEGATIVE Final    Comment: (NOTE) SARS-CoV-2 target nucleic acids are NOT DETECTED.  The SARS-CoV-2 RNA is generally detectable in upper and lower respiratory specimens during the acute phase of infection. The lowest concentration of SARS-CoV-2 viral copies this assay can detect is 250 copies / mL. A negative result does not preclude SARS-CoV-2 infection and should not be used as the sole basis for treatment or other patient management decisions.  A negative result may occur with improper specimen collection / handling, submission of specimen other than nasopharyngeal swab, presence of viral mutation(s) within the areas targeted by this assay, and inadequate number of viral copies (<250 copies / mL). A negative result must be combined with clinical observations, patient history, and epidemiological information.  Fact Sheet for Patients:   StrictlyIdeas.no  Fact Sheet for Healthcare Providers: BankingDealers.co.za  This test is not yet approved or  cleared by the [place] FDA and has been authorized for detection and/or diagnosis of SARS-CoV-2 by FDA under an Emergency Use Authorization (EUA).  This EUA will remain in effect (meaning this test can be used) for the duration of the COVID-19 declaration under Section 564(b)(1) of the Act, 21 U.S.C. section 360bbb-3(b)(1), unless the authorization is terminated or revoked sooner.  Performed at [facility], [address], [place], [place] [address]      Labs: Basic Metabolic Panel: Recent Labs  Lab 03/01/20 0622 03/02/20 0609 03/02/20 1438 03/03/20 0630 03/03/20 1352 03/04/20 0516 03/06/20 0411  NA 147*   < > 149* 146* 143 148* 146*  K 3.5   < > 3.3* 3.8 4.2 3.8 3.9  CL 116*   < > 114* 113* 111 111 113*  CO2 21*   < > 24 22 22 25 26   GLUCOSE 274*   < > 147* 186* 402* 93 88  BUN 72*   < > 68* 63* 63* 61* 49*  CREATININE 2.87*   < > 2.70* 2.64* 2.82*  2.55* 2.11*  CALCIUM 8.2*   < > 8.2* 7.8* 8.0* 8.3* 8.1*  MG 2.3  --  2.5*  --   --   --   --   PHOS  --   --   --   --   --   --  3.1   < > = values in this interval not displayed.   Liver Function Tests: Recent Labs  Lab 03/06/20 0411  ALBUMIN 2.3*   No results for input(s): LIPASE, AMYLASE in the last 168 hours. No results for input(s): AMMONIA in the last 168 hours. CBC: Recent Labs  Lab 02/29/20 0708 03/01/20 0622 03/02/20 0609 03/03/20 0630 03/04/20 0516  WBC 11.4* 9.8 9.8 9.5 10.0  HGB 8.4* 8.0* 7.8* 7.4* 7.7*  HCT 28.2* 27.2* 25.4* 24.4* 26.4*  MCV 89.0 92.5 88.2 91.0 91.0  PLT 267 271 322 316 374   Cardiac Enzymes: No results for input(s): CKTOTAL, CKMB, CKMBINDEX, TROPONINI in the last 168 hours. BNP: BNP (last 3 results)  No results for input(s): BNP in the last 8760 hours.  ProBNP (last 3 results) No results for input(s): PROBNP in the last 8760 hours.  CBG: Recent Labs  Lab 03/05/20 1224 03/05/20 1638 03/05/20 2131 03/06/20 0734 03/06/20 0736  GLUCAP 74 133* 120* 71 74       Signed:  Nita Sells MD   [facility] 03/06/2020, 9:52 AM

## 2020-03-06 NOTE — Progress Notes (Signed)
Physical Therapy Treatment Patient Details Name: Paul Perez MRN: [id number] DOB: 12/07/1927 Today's Date: 03/06/2020    History of Present Illness Paul Perez is a 86 y.o. male with medical history significant of Dementia, Diabetes Mellitus II,  Hypertension, CKD stage IV, premature atrial contractions , history of GI bleed, presents in the emergency department with confusion and fever. History is obtained from ED chart. Patient is demented,  unable to provide history.  History is also obtained from daughter Paul Perez over the phone.  This is patient's third ER visit  In last three weeks.  Last week he presented in the ED with generalized weakness and pain,  found to have a UTI . He was discharged on Keflex.  Daughter reports he has been having worsening pain and weakness, was not able to stand up in the morning.  He was brought in the emergency department the morning.    PT Comments    Patient eager to participate in today's session. Patient requires max assist for bed mobility today for LE movement and uprighting trunk to sit EOB. Patient showing improving sitting balance EOB and demonstrates good sitting tolerance. He requires assist for transitioning to supine with HOB elevated secondary to weakness. Patient will benefit from continued physical therapy in hospital and recommended venue below to increase strength, balance, endurance for safe ADLs and gait.   Follow Up Recommendations  SNF     Equipment Recommendations  None recommended by PT    Recommendations for Other Services       Precautions / Restrictions Precautions Precautions: Fall Restrictions Weight Bearing Restrictions: No    Mobility  Bed Mobility Overal bed mobility: Needs Assistance Bed Mobility: Supine to Sit;Sit to Supine     Supine to sit: Max assist Sit to supine: Max assist   General bed mobility comments: slow labored movement requiring Max assist to move BLE to EOB and to upright  trunk  Transfers                    Ambulation/Gait                 Stairs             Wheelchair Mobility    Modified Rankin (Stroke Patients Only)       Balance Overall balance assessment: Needs assistance Sitting-balance support: Feet supported;No upper extremity supported Sitting balance-Leahy Scale: Fair Sitting balance - Comments: fair/good with BUE support                                    Cognition Arousal/Alertness: Awake/alert Behavior During Therapy: Flat affect;Anxious Overall Cognitive Status: History of cognitive impairments - at baseline                                        Exercises      General Comments        Pertinent Vitals/Pain Pain Assessment: Faces Faces Pain Scale: Hurts little more Pain Location: BLE with pressure Pain Descriptors / Indicators: Grimacing;Guarding Pain Intervention(s): Limited activity within patient's tolerance;Monitored during session;Repositioned    Home Living                      Prior Function            PT Goals (  current goals can now be found in the care plan section) Acute Rehab PT Goals Patient Stated Goal: not stated PT Goal Formulation: With patient Time For Goal Achievement: 03/11/20 Potential to Achieve Goals: Fair Progress towards PT goals: Progressing toward goals    Frequency    Min 3X/week      PT Plan Current plan remains appropriate    Co-evaluation              AM-PAC PT "6 Clicks" Mobility   Outcome Measure  Help needed turning from your back to your side while in a flat bed without using bedrails?: A Lot Help needed moving from lying on your back to sitting on the side of a flat bed without using bedrails?: A Lot Help needed moving to and from a bed to a chair (including a wheelchair)?: Total Help needed standing up from a chair using your arms (e.g., wheelchair or bedside chair)?: Total Help needed to walk  in hospital room?: Total Help needed climbing 3-5 steps with a railing? : Total 6 Click Score: 8    End of Session   Activity Tolerance: Patient tolerated treatment well;Patient limited by fatigue;Patient limited by pain Patient left: in bed;with call bell/phone within reach;with bed alarm set Nurse Communication: Mobility status PT Visit Diagnosis: Unsteadiness on feet (R26.81);Other abnormalities of gait and mobility (R26.89);Muscle weakness (generalized) (M62.81)     Time: 4401-0272 PT Time Calculation (min) (ACUTE ONLY): 11 min  Charges:  $Therapeutic Activity: 8-22 mins                     11:26 AM, 03/06/20 Mearl Latin PT, DPT Physical Therapist at [facility]

## 2020-03-06 NOTE — TOC Transition Note (Signed)
Transition of Care [facility]) - CM/SW Discharge Note   Patient Details  Name: Paul Perez MRN: [id number] Date of Birth: 09/04/1927  [facility] CM/SW Contact:  Natasha Bence, LCSW Phone Number: 03/06/2020, 11:28 AM   Clinical Narrative:    CSW confirmed with Debbie from [facility] that re auth approval was received. Debbie able to take patient on 03/06/2020. CSW also confirmed out patient palliative services for patient while at SNF. Debbie agreeable to set up OP palliative. TOC completed Med necessity form and called EMS. Nurse to call report.   Final next level of care: [facility] Barriers to Discharge: Barriers Resolved   Patient Goals and CMS Choice Patient states their goals for this hospitalization and ongoing recovery are:: rehab with SNF and utilize outpatient palliative   Choice offered to / list presented to : Patient  Discharge Placement   Existing PASRR number confirmed : 03/06/20            Patient to be transferred to facility by: [facility] Name of family member notified: Lenor Derrick Patient and family notified of of transfer: 03/06/20  Discharge Plan and Services                                     Social Determinants of Health (SDOH) Interventions     Readmission Risk Interventions No flowsheet data found.

## 2020-03-06 NOTE — Care Management Important Message (Signed)
Important Message  Patient Details  Name: Paul Perez MRN: [id number] Date of Birth: 04-04-1928   Medicare Important Message Given:  Yes Colletta [place], RN agreed to deliver letter to patient room)     Tommy Medal 03/06/2020, 11:23 AM

## 2020-03-18 ENCOUNTER — Other Ambulatory Visit: Payer: Self-pay

## 2020-03-18 ENCOUNTER — Inpatient Hospital Stay (HOSPITAL_COMMUNITY)
Admission: EM | Admit: 2020-03-18 | Discharge: 2020-03-26 | DRG: 871 | Disposition: A | Discharge status: Hospice | Payer: Medicare Other | Source: Skilled Nursing Facility | Attending: Internal Medicine | Admitting: Internal Medicine

## 2020-03-18 ENCOUNTER — Emergency Department (HOSPITAL_COMMUNITY): Payer: Medicare Other

## 2020-03-18 ENCOUNTER — Encounter (HOSPITAL_COMMUNITY): Payer: Self-pay

## 2020-03-18 DIAGNOSIS — N509 Disorder of male genital organs, unspecified: Secondary | ICD-10-CM

## 2020-03-18 DIAGNOSIS — L89152 Pressure ulcer of sacral region, stage 2: Secondary | ICD-10-CM

## 2020-03-18 DIAGNOSIS — E118 Type 2 diabetes mellitus with unspecified complications: Secondary | ICD-10-CM

## 2020-03-18 DIAGNOSIS — IMO0002 Reserved for concepts with insufficient information to code with codable children: Secondary | ICD-10-CM | POA: Diagnosis present

## 2020-03-18 DIAGNOSIS — D649 Anemia, unspecified: Secondary | ICD-10-CM | POA: Diagnosis not present

## 2020-03-18 DIAGNOSIS — R6520 Severe sepsis without septic shock: Secondary | ICD-10-CM

## 2020-03-18 DIAGNOSIS — B9689 Other specified bacterial agents as the cause of diseases classified elsewhere: Secondary | ICD-10-CM | POA: Diagnosis present

## 2020-03-18 DIAGNOSIS — E1165 Type 2 diabetes mellitus with hyperglycemia: Secondary | ICD-10-CM | POA: Diagnosis present

## 2020-03-18 DIAGNOSIS — E1100 Type 2 diabetes mellitus with hyperosmolarity without nonketotic hyperglycemic-hyperosmolar coma (NKHHC): Secondary | ICD-10-CM

## 2020-03-18 DIAGNOSIS — Z66 Do not resuscitate: Secondary | ICD-10-CM | POA: Diagnosis not present

## 2020-03-18 DIAGNOSIS — E870 Hyperosmolality and hypernatremia: Secondary | ICD-10-CM | POA: Diagnosis present

## 2020-03-18 DIAGNOSIS — Z7189 Other specified counseling: Secondary | ICD-10-CM | POA: Diagnosis not present

## 2020-03-18 DIAGNOSIS — I129 Hypertensive chronic kidney disease with stage 1 through stage 4 chronic kidney disease, or unspecified chronic kidney disease: Secondary | ICD-10-CM | POA: Diagnosis present

## 2020-03-18 DIAGNOSIS — E1122 Type 2 diabetes mellitus with diabetic chronic kidney disease: Secondary | ICD-10-CM | POA: Diagnosis present

## 2020-03-18 DIAGNOSIS — N179 Acute kidney failure, unspecified: Secondary | ICD-10-CM | POA: Diagnosis present

## 2020-03-18 DIAGNOSIS — A4152 Sepsis due to Pseudomonas: Principal | ICD-10-CM | POA: Diagnosis present

## 2020-03-18 DIAGNOSIS — S80812A Abrasion, left lower leg, initial encounter: Secondary | ICD-10-CM

## 2020-03-18 DIAGNOSIS — E876 Hypokalemia: Secondary | ICD-10-CM | POA: Diagnosis present

## 2020-03-18 DIAGNOSIS — F0390 Unspecified dementia without behavioral disturbance: Secondary | ICD-10-CM | POA: Diagnosis present

## 2020-03-18 DIAGNOSIS — A419 Sepsis, unspecified organism: Secondary | ICD-10-CM | POA: Diagnosis not present

## 2020-03-18 DIAGNOSIS — Z79899 Other long term (current) drug therapy: Secondary | ICD-10-CM

## 2020-03-18 DIAGNOSIS — E860 Dehydration: Secondary | ICD-10-CM | POA: Diagnosis present

## 2020-03-18 DIAGNOSIS — D62 Acute posthemorrhagic anemia: Secondary | ICD-10-CM | POA: Diagnosis present

## 2020-03-18 DIAGNOSIS — N390 Urinary tract infection, site not specified: Secondary | ICD-10-CM | POA: Diagnosis present

## 2020-03-18 DIAGNOSIS — I10 Essential (primary) hypertension: Secondary | ICD-10-CM | POA: Diagnosis not present

## 2020-03-18 DIAGNOSIS — R1312 Dysphagia, oropharyngeal phase: Secondary | ICD-10-CM | POA: Diagnosis present

## 2020-03-18 DIAGNOSIS — Z515 Encounter for palliative care: Secondary | ICD-10-CM | POA: Diagnosis not present

## 2020-03-18 DIAGNOSIS — G9341 Metabolic encephalopathy: Secondary | ICD-10-CM | POA: Diagnosis present

## 2020-03-18 DIAGNOSIS — R6521 Severe sepsis with septic shock: Secondary | ICD-10-CM | POA: Diagnosis present

## 2020-03-18 DIAGNOSIS — K922 Gastrointestinal hemorrhage, unspecified: Secondary | ICD-10-CM | POA: Diagnosis not present

## 2020-03-18 DIAGNOSIS — M7989 Other specified soft tissue disorders: Secondary | ICD-10-CM

## 2020-03-18 DIAGNOSIS — E8809 Other disorders of plasma-protein metabolism, not elsewhere classified: Secondary | ICD-10-CM

## 2020-03-18 DIAGNOSIS — J189 Pneumonia, unspecified organism: Secondary | ICD-10-CM | POA: Diagnosis present

## 2020-03-18 DIAGNOSIS — R195 Other fecal abnormalities: Secondary | ICD-10-CM | POA: Diagnosis not present

## 2020-03-18 DIAGNOSIS — Z20822 Contact with and (suspected) exposure to covid-19: Secondary | ICD-10-CM | POA: Diagnosis present

## 2020-03-18 DIAGNOSIS — N184 Chronic kidney disease, stage 4 (severe): Secondary | ICD-10-CM | POA: Diagnosis present

## 2020-03-18 DIAGNOSIS — X58XXXA Exposure to other specified factors, initial encounter: Secondary | ICD-10-CM | POA: Diagnosis present

## 2020-03-18 DIAGNOSIS — Z833 Family history of diabetes mellitus: Secondary | ICD-10-CM

## 2020-03-18 DIAGNOSIS — R509 Fever, unspecified: Secondary | ICD-10-CM | POA: Diagnosis present

## 2020-03-18 HISTORY — DX: Disorder of kidney and ureter, unspecified: N28.9

## 2020-03-18 HISTORY — DX: Dysphagia, unspecified: R13.10

## 2020-03-18 LAB — COMPREHENSIVE METABOLIC PANEL
ALT: 33 U/L (ref 0–44)
AST: 46 U/L — ABNORMAL HIGH (ref 15–41)
Albumin: 2.2 g/dL — ABNORMAL LOW (ref 3.5–5.0)
Alkaline Phosphatase: 71 U/L (ref 38–126)
Anion gap: 15 (ref 5–15)
BUN: 83 mg/dL — ABNORMAL HIGH (ref 8–23)
CO2: 21 mmol/L — ABNORMAL LOW (ref 22–32)
Calcium: 9.2 mg/dL (ref 8.9–10.3)
Chloride: 122 mmol/L — ABNORMAL HIGH (ref 98–111)
Creatinine, Ser: 3.27 mg/dL — ABNORMAL HIGH (ref 0.61–1.24)
GFR, Estimated: 17 mL/min — ABNORMAL LOW (ref >60)
Glucose, Bld: 678 mg/dL (ref 70–99)
Potassium: 4.2 mmol/L (ref 3.5–5.1)
Sodium: 158 mmol/L — ABNORMAL HIGH (ref 135–145)
Total Bilirubin: 0.6 mg/dL (ref 0.3–1.2)
Total Protein: 6.3 g/dL — ABNORMAL LOW (ref 6.5–8.1)

## 2020-03-18 LAB — CBC WITH DIFFERENTIAL/PLATELET
Band Neutrophils: 4 %
Basophils Absolute: 0 10*3/uL (ref 0.0–0.1)
Basophils Relative: 0 %
Eosinophils Absolute: 0 10*3/uL (ref 0.0–0.5)
Eosinophils Relative: 0 %
HCT: 24 % — ABNORMAL LOW (ref 39.0–52.0)
Hemoglobin: 6.8 g/dL — CL (ref 13.0–17.0)
Lymphocytes Relative: 11 %
Lymphs Abs: 1.1 10*3/uL (ref 0.7–4.0)
MCH: 26.2 pg (ref 26.0–34.0)
MCHC: 28.3 g/dL — ABNORMAL LOW (ref 30.0–36.0)
MCV: 92.3 fL (ref 80.0–100.0)
Metamyelocytes Relative: 6 %
Monocytes Absolute: 0 10*3/uL — ABNORMAL LOW (ref 0.1–1.0)
Monocytes Relative: 0 %
Neutro Abs: 8.5 10*3/uL — ABNORMAL HIGH (ref 1.7–7.7)
Neutrophils Relative %: 79 %
Platelets: 342 10*3/uL (ref 150–400)
RBC: 2.6 MIL/uL — ABNORMAL LOW (ref 4.22–5.81)
RDW: 15.8 % — ABNORMAL HIGH (ref 11.5–15.5)
WBC: 10.3 10*3/uL (ref 4.0–10.5)
nRBC: 0.5 % — ABNORMAL HIGH (ref 0.0–0.2)

## 2020-03-18 LAB — PROTIME-INR
INR: 1.4 — ABNORMAL HIGH (ref 0.8–1.2)
Prothrombin Time: 16.4 seconds — ABNORMAL HIGH (ref 11.4–15.2)

## 2020-03-18 LAB — BASIC METABOLIC PANEL
Anion gap: 14 (ref 5–15)
BUN: 83 mg/dL — ABNORMAL HIGH (ref 8–23)
CO2: 22 mmol/L (ref 22–32)
Calcium: 9 mg/dL (ref 8.9–10.3)
Chloride: 125 mmol/L — ABNORMAL HIGH (ref 98–111)
Creatinine, Ser: 3.17 mg/dL — ABNORMAL HIGH (ref 0.61–1.24)
GFR, Estimated: 18 mL/min — ABNORMAL LOW (ref >60)
Glucose, Bld: 352 mg/dL — ABNORMAL HIGH (ref 70–99)
Potassium: 4.2 mmol/L (ref 3.5–5.1)
Sodium: 161 mmol/L (ref 135–145)

## 2020-03-18 LAB — RESPIRATORY PANEL BY RT PCR (FLU A&B, COVID)
Influenza A by PCR: NEGATIVE
Influenza B by PCR: NEGATIVE
SARS Coronavirus 2 by RT PCR: NEGATIVE

## 2020-03-18 LAB — CBG MONITORING, ED
Glucose-Capillary: 569 mg/dL (ref 70–99)
Glucose-Capillary: 332 mg/dL — ABNORMAL HIGH (ref 70–99)
Glucose-Capillary: 283 mg/dL — ABNORMAL HIGH (ref 70–99)

## 2020-03-18 LAB — LACTIC ACID, PLASMA
Lactic Acid, Venous: 6.6 mmol/L (ref 0.5–1.9)
Lactic Acid, Venous: 6.2 mmol/L (ref 0.5–1.9)

## 2020-03-18 LAB — APTT: aPTT: 30 seconds (ref 24–36)

## 2020-03-18 LAB — PREPARE RBC (CROSSMATCH)

## 2020-03-18 MED ORDER — VANCOMYCIN HCL 1500 MG/300ML IV SOLN
1500.0000 mg | Freq: Once | INTRAVENOUS | Status: AC
  Administered 2020-03-18: 1500 mg via INTRAVENOUS
  Filled 2020-03-18: qty 300

## 2020-03-18 MED ORDER — SODIUM CHLORIDE 0.9 % IV SOLN
500.0000 mg | Freq: Once | INTRAVENOUS | Status: AC
  Administered 2020-03-18: 500 mg via INTRAVENOUS
  Filled 2020-03-18: qty 500

## 2020-03-18 MED ORDER — SODIUM CHLORIDE 0.9 % IV SOLN
1.0000 g | Freq: Once | INTRAVENOUS | Status: AC
  Administered 2020-03-18: 1 g via INTRAVENOUS
  Filled 2020-03-18: qty 10

## 2020-03-18 MED ORDER — SODIUM CHLORIDE 0.9 % IV SOLN: 1000.0000 mL | INTRAVENOUS | Status: DC

## 2020-03-18 MED ORDER — VANCOMYCIN HCL IN DEXTROSE 1-5 GM/200ML-% IV SOLN: 1000.0000 mg | INTRAVENOUS | Status: DC

## 2020-03-18 MED ORDER — ACETAMINOPHEN 325 MG PO TABS: 650.0000 mg | ORAL_TABLET | Freq: Four times a day (QID) | ORAL | Status: DC | PRN

## 2020-03-18 MED ORDER — LACTATED RINGERS IV BOLUS (SEPSIS): 2500.0000 mL | Freq: Once | INTRAVENOUS | Status: DC

## 2020-03-18 MED ORDER — ACETAMINOPHEN 650 MG RE SUPP
650.0000 mg | Freq: Once | RECTAL | Status: AC
  Administered 2020-03-18: 650 mg via RECTAL
  Filled 2020-03-18: qty 1

## 2020-03-18 MED ORDER — SODIUM CHLORIDE 0.9 % IV SOLN
8.0000 mg/h | INTRAVENOUS | Status: AC
  Administered 2020-03-18 – 2020-03-21 (×6): 8 mg/h via INTRAVENOUS
  Filled 2020-03-18 (×10): qty 80

## 2020-03-18 MED ORDER — SODIUM CHLORIDE 0.9 % IV SOLN: INTRAVENOUS | Status: DC

## 2020-03-18 MED ORDER — SODIUM CHLORIDE 0.9 % IV BOLUS (SEPSIS)
1000.0000 mL | Freq: Once | INTRAVENOUS | Status: AC
  Administered 2020-03-18: 1000 mL via INTRAVENOUS

## 2020-03-18 MED ORDER — SODIUM CHLORIDE 0.9 % IV SOLN
80.0000 mg | Freq: Once | INTRAVENOUS | Status: AC
  Administered 2020-03-18: 80 mg via INTRAVENOUS
  Filled 2020-03-18: qty 80

## 2020-03-18 MED ORDER — SODIUM CHLORIDE 0.9 % IV SOLN: 10.0000 mL/h | Freq: Once | INTRAVENOUS | Status: DC

## 2020-03-18 MED ORDER — LACTATED RINGERS IV SOLN: INTRAVENOUS | Status: DC

## 2020-03-18 MED ORDER — PANTOPRAZOLE SODIUM 40 MG IV SOLR
40.0000 mg | Freq: Two times a day (BID) | INTRAVENOUS | Status: DC
  Administered 2020-03-22 – 2020-03-26 (×9): 40 mg via INTRAVENOUS
  Filled 2020-03-18 (×9): qty 40

## 2020-03-18 MED ORDER — SODIUM CHLORIDE 0.9 % IV SOLN
2.0000 g | INTRAVENOUS | Status: DC
  Administered 2020-03-18 – 2020-03-20 (×3): 2 g via INTRAVENOUS
  Filled 2020-03-18 (×3): qty 2

## 2020-03-18 MED ORDER — INSULIN REGULAR(HUMAN) IN NACL 100-0.9 UT/100ML-% IV SOLN
INTRAVENOUS | Status: DC
  Administered 2020-03-18: 7.5 [IU]/h via INTRAVENOUS
  Filled 2020-03-18: qty 100

## 2020-03-18 MED ORDER — DEXTROSE 50 % IV SOLN: 0.0000 mL | INTRAVENOUS | Status: DC | PRN

## 2020-03-18 NOTE — ED Notes (Signed)
Date and time results received: 03/18/20 1948 (use smartphrase ".now" to insert current time)  Test: Lactic Acid Critical Value: 6.6  Name of Provider Notified: [place], [place]  Orders Received? Or Actions Taken?: Actions Taken: no orders received

## 2020-03-18 NOTE — ED Notes (Signed)
Date and time results received: 03/18/20 1935 (use smartphrase ".now" to insert current time)  Test: glucose Critical Value: 678  Name of Provider Notified: Threasa Alpha, [place]  Orders Received? Or Actions Taken?: Actions Taken: no orders received

## 2020-03-18 NOTE — ED Notes (Signed)
Pt. Has a stage 2 pressure injury in between their buttocks. Pt. Has has stage 2 pressure injury to their scrotum.

## 2020-03-18 NOTE — ED Triage Notes (Signed)
[facility] reports glucose has been elevated today.  EMS reports glucose reading high on meter.  Reports pt usually able to answer yes and no questions.  Reports recent sepsis.

## 2020-03-18 NOTE — H&P (Addendum)
History and Physical  Paul Perez NLG:[id number] DOB: [birth year] DOA: 03/18/2020  Referring physician: Fransico Meadow PCP: Alanson Puls [facility]  Patient coming from:  [facility]  Chief Complaint: Elevated blood glucose level and Fever  HPI: Paul Perez is a 86 y.o. male with medical history significant for dementia, T2 DM, Hypertension, CKD stage IV, premature atrial contractions  and history of GI bleed who presents  to the emergency department from [facility] with elevated blood glucose level, confusion and fever.  Patient was unable to provide history possible due to underlying dementia and chronic condition, history was obtained from ED physician assistant and ED medical record.  Per report, patient was noted to be febrile with a temperature of 101F, his blood glucose level was noted to be over 500, he was noted to be confused (patient was able to respond to yes/no answers at baseline), so he was sent to the ED for further evaluation and management.  Patient was recently admitted on 10/9 and discharged on 10/22 due to severe sepsis secondary to Pseudomonas bacteremia (treated with 7 days of IV cefepime with unremarkable repeated blood culture)  ED Course: In the emergency department, he was febrile with a temperature of 101F, tachypneic with RR at rate of 25-28/min.  BP was 154/61, but O2 sat was 100% on room air.  Work-up in the ED showed H/H 6.8/24, hyponatremia, hyperglycemia, BUN to creatinine 83/3.27 (baseline creatinine at 2.4-2.6), lactic acid 6.6, RSV panel for influenza A, B and SARS coronavirus 2 was negative. Stool occult blood was noted to be positive.  Chest x-ray showed more coalescent patchy opacities in the right medial lung base/infrahilar lung which could reflect pneumonia in the appropriate clinical setting.  He was treated with IV ceftriaxone and azithromycin, Tylenol 674m x1 was given rectally.  IV hydration was provided and patient was  started on a DKA/HHS protocol.  Hospitalist was asked to admit patient for further evaluation management.  Review of Systems:  This cannot be obtained at this time due to patient's current condition  Past Medical History:  Diagnosis Date  . Dementia ([place]   . Dementia ([place]   . Diabetes mellitus without complication ([place]   . Dysphagia   . Encephalopathy   . Gastritis 2004  . GIB (gastrointestinal bleeding) 2004  . Hypertension   . Poor historian   . Renal disorder   . Sinus bradycardia seen on cardiac monitor    Past Surgical History:  Procedure Laterality Date  . INGUINAL HERNIA REPAIR  09/2010   Bilateral  . UPPER GASTROINTESTINAL ENDOSCOPY  2004    Social History:  reports that he has never smoked. He has never used smokeless tobacco. He reports that he does not drink alcohol and does not use drugs.   No Known Allergies  Family History  Problem Relation Age of Onset  . Diabetes Mother   . Diabetes Sister     Prior to Admission medications   Medication Sig Start Date End Date Taking? Authorizing Provider  acetaminophen (TYLENOL) 650 MG CR tablet Take 650 mg by mouth every 8 (eight) hours as needed for pain.   Yes [provider]  amLODipine (NORVASC) 5 MG tablet Take 1 tablet (5 mg total) by mouth daily. 03/06/20  Yes SNita Sells MD  BRoseanne KaufmanPeru-Castor Oil ([facility] OINT Apply liberal amount to coccyx, sacrum and bilateral buttocks q shift and prn after incontinence episodes for skin protection   Yes [provider]  busPIRone (BUSPAR) 5 MG tablet Take 1 tablet (5 mg total) by mouth 2 (two) times daily. 03/06/20  Yes Nita Sells, MD  cloNIDine (CATAPRES - DOSED IN MG/24 HR) 0.1 mg/24hr patch Place 1 patch (0.1 mg total) onto the skin once a week. 03/08/20  Yes Nita Sells, MD  insulin lispro (ADMELOG) 100 UNIT/ML injection Inject 2-12 Units into the skin See admin instructions. Inject as per sliding scale: if 201-250=2units;  251-300=4 units;301-350-=6 units;351-400=8 units;401-450=10 units; 451-500=12 units.   Yes [provider]  iron polysaccharides (NIFEREX) 150 MG capsule Take 150 mg by mouth daily.   Yes [provider]  melatonin 3 MG TABS tablet Take 1 tablet (3 mg total) by mouth at bedtime as needed (insomnia). 03/06/20  Yes Nita Sells, MD  metoprolol tartrate (LOPRESSOR) 25 MG tablet Take 1 tablet (25 mg total) by mouth 2 (two) times daily. 03/06/20  Yes Nita Sells, MD  pioglitazone (ACTOS) 15 MG tablet Take 1 tablet by mouth daily. 12/01/16  Yes [provider]  senna (SENOKOT) 8.6 MG TABS tablet Take 1 tablet (8.6 mg total) by mouth 2 (two) times daily. 03/06/20  Yes Nita Sells, MD  Vitamin D, Cholecalciferol, 1000 units TABS Take 1,000 Units by mouth daily.    Yes [provider]  donepezil (ARICEPT) 5 MG tablet Take 5 mg by mouth at bedtime.    [provider]  food thickener (THICK IT) POWD Take 1 Container by mouth as needed (as needed to thicken liquids). 03/06/20   Nita Sells, MD  polyethylene glycol (MIRALAX / GLYCOLAX) packet Take 17 g by mouth daily as needed for mild constipation. 08/30/17   Barton Dubois, MD  QUEtiapine (SEROQUEL) 25 MG tablet Take 0.5 tablets (12.5 mg total) by mouth daily. 03/06/20   Nita Sells, MD    Physical Exam: BP (!) 154/61   Pulse 78   Temp (!) 101 F (38.3 C) (Oral)   Resp (!) 22   Ht 6' (1.829 m)   Wt 83 kg   SpO2 100%   BMI 24.82 kg/m   . General: 86 y.o. year-old male ill and toxic appearing with fever and chills.  Moans on turning patient while changing adult diapers. Marland Kitchen HEENT: Dry mucous membrane, NCAT . Neck: Supple, trachea media . Cardiovascular: Regular rate and rhythm with no rubs or gallops.  No thyromegaly or JVD noted.  No lower extremity edema. 2/4 pulses in all 4 extremities. Marland Kitchen Respiratory: Tachypnea.  Clear to auscultation with no wheezes or rales.   . Abdomen: Soft nontender nondistended with normal bowel sounds x4 quadrants. . Muskuloskeletal: Bandaged abrasive lesion on left shin.  No cyanosis, clubbing or edema noted bilaterally . Genitourinary: Stool occult positive . Neuro: CN II- responds to tactile stimuli.  Detailed neurological exam cannot be performed at this time due to patient's current condition.   . Skin: Sacral decubitus and scrotal lesion.  Psychiatry: This cannot be obtained at this time due to patient's current condition.         Labs on Admission:  Basic Metabolic Panel: Recent Labs  Lab 03/18/20 1858  NA 158*  K 4.2  CL 122*  CO2 21*  GLUCOSE 678*  BUN 83*  CREATININE 3.27*  CALCIUM 9.2   Liver Function Tests: Recent Labs  Lab 03/18/20 1858  AST 46*  ALT 33  ALKPHOS 71  BILITOT 0.6  PROT 6.3*  ALBUMIN 2.2*   No results for input(s): LIPASE, AMYLASE in the last 168 hours. No results  for input(s): AMMONIA in the last 168 hours. CBC: Recent Labs  Lab 03/18/20 1858  WBC 10.3  NEUTROABS 8.5*  HGB 6.8*  HCT 24.0*  MCV 92.3  PLT 342   Cardiac Enzymes: No results for input(s): CKTOTAL, CKMB, CKMBINDEX, TROPONINI in the last 168 hours.  BNP (last 3 results) No results for input(s): BNP in the last 8760 hours.  ProBNP (last 3 results) No results for input(s): PROBNP in the last 8760 hours.  CBG: Recent Labs  Lab 03/18/20 1835  GLUCAP 569*    Radiological Exams on Admission: DG Chest 1 View  Result Date: 03/18/2020 CLINICAL DATA:  Altered mental status, elevated glucose EXAM: CHEST  1 VIEW COMPARISON:  Radiograph 02/26/2020 FINDINGS: More coalescent patchy opacities seen in the right medial lung base/infrahilar lung. Additional atelectatic changes and vascular crowding are present as well. Vascularity remains fairly well-defined. No pneumothorax or effusion. The aorta is calcified. The remaining cardiomediastinal contours are unremarkable. No acute osseous or soft tissue abnormality.  Degenerative changes are present in the imaged spine and shoulders. Telemetry leads overlie the chest. IMPRESSION: More coalescent patchy opacities in the right medial lung base/infrahilar lung could reflect pneumonia in the appropriate clinical setting. Additional atelectatic changes elsewhere. Aortic Atherosclerosis (ICD10-I70.0). Electronically Signed   By: Lovena Le M.D.   On: 03/18/2020 19:22    EKG: I independently viewed the EKG done and my findings are as followed: Sinus tachycardia at a rate of 100 bpm  Assessment/Plan Present on Admission: . Acute febrile illness . Diabetes mellitus type 2, uncontrolled, with complications ([place]) . Hypertension . Hypernatremia . CKD (chronic kidney disease) stage 4, GFR 15-29 ml/min (HCC)  Principal Problem:   Severe sepsis (HCC) Active Problems:   Hypertension   Hypernatremia   Diabetes mellitus type 2, uncontrolled, with complications (HCC)   Dehydration   CKD (chronic kidney disease) stage 4, GFR 15-29 ml/min (HCC)   Acute febrile illness   Hyperosmolar hyperglycemic state (HHS) (HCC)   GI bleed   Sacral decubitus ulcer, stage II ([place])   Abrasion of left leg   Scrotal skin lesion   Hypoalbuminemia  Acute metabolic encephalopathy secondary to acute febrile illness due to to severe sepsis secondary to multifactorial including presumed CAP POA, possible bacteremia. Patient was febrile and tachypneic (met SIRS criteria), chest x-ray was suggestive of possible pneumonia, lactic acid was 6.6 (> 4)-meet severe sepsis criteria He was started on IV ceftriaxone and azithromycin in the ED Patient was recently treated for sepsis secondary to bacteremia due to Pseudomonas aeruginosa  Blood culture 02/22/2020) was positive for pulmonary circulation and sensitive to cefepime We shall continue with IV vancomycin and cefepime at this time with plan to de-escalate based on blood culture, urine culture, procalcitonin. Continue Tylenol 650 mg every 6  hours as needed for fever Patient will be kept n.p.o. at this time due to history of mild oropharyngeal dysphagia/ DKA-HHS protocol Speech therapist will be consulted for reevaluation prior to onset of feeding.  Hyperglycemia secondary to HHS Hyperglycemia secondary to poorly controlled type 2 diabetes mellitus Continue insulin drip, IV NS with IV potassium per DKA/HHS protocol Transition IV NS to D5 1/2NS when serum glucose reaches 253m/dL Continue serial BMP and VBG Continue NPO  NOTE: Patients BG down to 80s, no anion gap, Insulin was paused, D-51/2NS increased to 1066m/Hr, BG now increased to 170s, patient was started on 1/2NS (due to hypernatremia), D5-1/2NS stopped. ISS Q4h while npo started.  Hypernatremia possibly due to dehydration Na+ 158, with  a free water deficit of 5.3L, patient received 2.0 L of NS with continuous IV NS hydration with plan to transition to Half-Normal Saline at 55ms/Hr Continue serial BMP and adjust infusion rate based on findings.  Acute GI bleed Patient has a history of GI bleed per medical record H/H= 6.8/24, this was 7.7/26.4  on 03/04/20 Hemoccult was positive Type and crossmatch was done 1 unit of PRBC was ordered to be transfused in the ED Continue IV Protonix drip Gastroenterologist will be consulted in the morning  Dehydration Continue IV hydration  Acute kidney injury on CKD stage IV (possibly prerenal) BUN/creatinine at 83/3.27 (baseline creatinine 2.4-2.6) Renally adjust medications, avoid nephrotoxic agents/dehydration/hypotension  Sacral decubitus stage II with scrotal lesion and left leg abrasion Continue wound care  Hypoalbuminemia secondary to moderate protein calorie moderation  Dietitian will be consulted  History of mild oropharyngeal dysphagia Barium swallow on 10/13 was unremarkable and dysphagia 3 diet was recommended Swallow eval repeat to ensure no worsening of dysphagia prior to oral intake    DVT prophylaxis:  SCDs (no indication for chemoprophylaxis at this time due to GI bleed)  Code Status: DNR   Family Communication: None at bedside  Disposition Plan:  [facility] to:                   SNF  Anticipated DC date:               2-3 days Anticipated DC barriers:          Patient is unstable to be discharged at this time due to severe sepsis requiring IV antibiotics and hyperglycemia [facility]   Consults called: None  Admission status: Inpatient    OBernadette HoitMD [facility]  03/18/2020, 9:52 PM

## 2020-03-18 NOTE — ED Notes (Signed)
Unable to obtain manual B/P due to pts. Tremors. This nurse has attempted in both arms and legs.

## 2020-03-18 NOTE — ED Provider Notes (Addendum)
[facility] EMERGENCY DEPARTMENT Provider Note   CSN: [id number] Arrival date & time: 03/18/20  1830     History Chief Complaint  Patient presents with  . Hyperglycemia    Paul Perez is a 86 y.o. male.  The history is provided by the patient. No language interpreter was used.  Hyperglycemia Blood sugar level PTA:  Over 500 Severity:  Severe Onset quality:  Gradual Timing:  Constant Progression:  Worsening Chronicity:  New Diabetes status:  Controlled with insulin Relieved by:  Nothing Ineffective treatments:  None tried Associated symptoms: fever   Fever Max temp prior to arrival:  101 Severity:  Moderate Onset quality:  Sudden Timing:  Constant Worsened by:  Nothing      Past Medical History:  Diagnosis Date  . Dementia ([place])   . Dementia ([place])   . Diabetes mellitus without complication ([place])   . Dysphagia   . Encephalopathy   . Gastritis 2004  . GIB (gastrointestinal bleeding) 2004  . Hypertension   . Poor historian   . Renal disorder   . Sinus bradycardia seen on cardiac monitor     Patient Active Problem List   Diagnosis Date Noted  . Palliative care by specialist   . Goals of care, counseling/discussion   . DNR (do not resuscitate)   . Abdominal distension 03/01/2020  . Oropharyngeal dysphagia 02/29/2020  . AF (paroxysmal atrial fibrillation) ([place]) 02/26/2020  . Bacteremia due to Pseudomonas 02/26/2020  . CKD (chronic kidney disease) stage 4, GFR 15-29 ml/min (HCC) 12/21/2017  . Anemia 12/21/2017  . Dementia ([place]) 09/28/2017  . Leg edema 09/28/2017  . Acute on chronic diastolic CHF (congestive heart failure) ([place]) 09/27/2017  . Acute renal failure superimposed on stage 3 chronic kidney disease ([place])   . Elevated troponin 08/25/2017  . Hypernatremia 08/25/2017  . Hyperlipidemia 08/25/2017  . Diabetes mellitus type 2, uncontrolled, with complications ([place]) 43/15/4008  . Bradycardia 08/28/2013  . PAC (premature atrial contraction)  08/28/2013  . Altered mental state 08/20/2013  . Acute encephalopathy 08/20/2013  . Hypertension 08/20/2013  . Rhabdomyolysis 08/20/2013  . UTI (lower urinary tract infection) 08/20/2013    Past Surgical History:  Procedure Laterality Date  . INGUINAL HERNIA REPAIR  09/2010   Bilateral  . UPPER GASTROINTESTINAL ENDOSCOPY  2004       Family History  Problem Relation Age of Onset  . Diabetes Mother   . Diabetes Sister     Social History   Tobacco Use  . Smoking status: Never Smoker  . Smokeless tobacco: Never Used  Vaping Use  . Vaping Use: Never used  Substance Use Topics  . Alcohol use: No  . Drug use: No    Home Medications Prior to Admission medications   Medication Sig Start Date End Date Taking? Authorizing Provider  amLODipine (NORVASC) 5 MG tablet Take 1 tablet (5 mg total) by mouth daily. 03/06/20   Nita Sells, MD  Balsam [place]-Castor Oil [facility]) OINT Apply liberal amount to coccyx, sacrum and bilateral buttocks q shift and prn after incontinence episodes for skin protection    [provider]  busPIRone (BUSPAR) 5 MG tablet Take 1 tablet (5 mg total) by mouth 2 (two) times daily. 03/06/20   Nita Sells, MD  cloNIDine (CATAPRES - DOSED IN MG/24 HR) 0.1 mg/24hr patch Place 1 patch (0.1 mg total) onto the skin once a week. 03/08/20   Nita Sells, MD  donepezil (ARICEPT) 5 MG tablet Take 5 mg by mouth at bedtime.  [provider]  food thickener (THICK IT) POWD Take 1 Container by mouth as needed (as needed to thicken liquids). 03/06/20   Nita Sells, MD  iron polysaccharides (NIFEREX) 150 MG capsule Take 150 mg by mouth daily.    [provider]  melatonin 3 MG TABS tablet Take 1 tablet (3 mg total) by mouth at bedtime as needed (insomnia). 03/06/20   Nita Sells, MD  metoprolol tartrate (LOPRESSOR) 25 MG tablet Take 1 tablet (25 mg total) by mouth 2 (two) times daily. 03/06/20   Nita Sells, MD  pioglitazone (ACTOS) 15 MG tablet Take 1 tablet by mouth daily. 12/01/16   [provider]  polyethylene glycol (MIRALAX / GLYCOLAX) packet Take 17 g by mouth daily as needed for mild constipation. 08/30/17   Barton Dubois, MD  QUEtiapine (SEROQUEL) 25 MG tablet Take 0.5 tablets (12.5 mg total) by mouth daily. 03/06/20   Nita Sells, MD  senna (SENOKOT) 8.6 MG TABS tablet Take 1 tablet (8.6 mg total) by mouth 2 (two) times daily. 03/06/20   Nita Sells, MD  Vitamin D, Cholecalciferol, 1000 units TABS Take 1,000 Units by mouth daily.     [provider]    Allergies    Patient has no known allergies.  Review of Systems   Review of Systems  Unable to perform ROS: Patient nonverbal  Constitutional: Positive for fever.    Physical Exam Updated Vital Signs BP (!) 154/61   Pulse 90   Temp (!) 101 F (38.3 C) (Oral)   Resp (!) 28   Ht 6' (1.829 m)   Wt 83 kg   SpO2 100%   BMI 24.82 kg/m   Physical Exam Vitals and nursing note reviewed.  Constitutional:      Appearance: He is well-developed. He is ill-appearing and toxic-appearing.  HENT:     Head: Normocephalic.     Mouth/Throat:     Mouth: Mucous membranes are moist.  Eyes:     Conjunctiva/sclera: Conjunctivae normal.  Cardiovascular:     Rate and Rhythm: Normal rate.     Heart sounds: No murmur heard.   Pulmonary:     Effort: No respiratory distress.     Breath sounds: Normal breath sounds.  Abdominal:     Palpations: Abdomen is soft.  Genitourinary:    Rectum: Guaiac result positive.  Musculoskeletal:     Cervical back: Neck supple.  Skin:    General: Skin is warm.  Neurological:     General: No focal deficit present.     Mental Status: He is alert.     ED Results / Procedures / Treatments   Labs (all labs ordered are listed, but only abnormal results are displayed) Labs Reviewed  COMPREHENSIVE METABOLIC PANEL - Abnormal; Notable for the following  components:      Result Value   Sodium 158 (*)    Chloride 122 (*)    CO2 21 (*)    Glucose, Bld 678 (*)    BUN 83 (*)    Creatinine, Ser 3.27 (*)    Total Protein 6.3 (*)    Albumin 2.2 (*)    AST 46 (*)    GFR, Estimated 17 (*)    All other components within normal limits  LACTIC ACID, PLASMA - Abnormal; Notable for the following components:   Lactic Acid, Venous 6.6 (*)    All other components within normal limits  CBC WITH DIFFERENTIAL/PLATELET - Abnormal; Notable for the following components:   RBC 2.60 (*)  Hemoglobin 6.8 (*)    HCT 24.0 (*)    MCHC 28.3 (*)    RDW 15.8 (*)    nRBC 0.5 (*)    Neutro Abs 8.5 (*)    Monocytes Absolute 0.0 (*)    All other components within normal limits  PROTIME-INR - Abnormal; Notable for the following components:   Prothrombin Time 16.4 (*)    INR 1.4 (*)    All other components within normal limits  CULTURE, BLOOD (ROUTINE X 2)  CULTURE, BLOOD (ROUTINE X 2)  RESPIRATORY PANEL BY RT PCR (FLU A&B, COVID)  URINE CULTURE  LACTIC ACID, PLASMA  URINALYSIS, ROUTINE W REFLEX MICROSCOPIC  APTT    EKG None  Radiology DG Chest 1 View  Result Date: 03/18/2020 CLINICAL DATA:  Altered mental status, elevated glucose EXAM: CHEST  1 VIEW COMPARISON:  Radiograph 02/26/2020 FINDINGS: More coalescent patchy opacities seen in the right medial lung base/infrahilar lung. Additional atelectatic changes and vascular crowding are present as well. Vascularity remains fairly well-defined. No pneumothorax or effusion. The aorta is calcified. The remaining cardiomediastinal contours are unremarkable. No acute osseous or soft tissue abnormality. Degenerative changes are present in the imaged spine and shoulders. Telemetry leads overlie the chest. IMPRESSION: More coalescent patchy opacities in the right medial lung base/infrahilar lung could reflect pneumonia in the appropriate clinical setting. Additional atelectatic changes elsewhere. Aortic Atherosclerosis  (ICD10-I70.0). Electronically Signed   By: Lovena Le M.D.   On: 03/18/2020 19:22    Procedures .Critical Care Performed by: Fransico Meadow, PA-C Authorized by: Fransico Meadow, PA-C   Critical care provider statement:    Critical care time (minutes):  45   Critical care start time:  03/18/2020 7:00 AM   Critical care end time:  03/18/2020 8:53 PM   Critical care was time spent personally by me on the following activities:  Discussions with consultants, evaluation of patient's response to treatment, examination of patient, ordering and performing treatments and interventions, ordering and review of laboratory studies, ordering and review of radiographic studies, pulse oximetry, re-evaluation of patient's condition, obtaining history from patient or surrogate and review of old charts   (including critical care time)  Medications Ordered in ED Medications  sodium chloride 0.9 % bolus 1,000 mL (has no administration in time range)    Followed by  sodium chloride 0.9 % bolus 1,000 mL (has no administration in time range)    Followed by  0.9 %  sodium chloride infusion (has no administration in time range)  acetaminophen (TYLENOL) tablet 650 mg (has no administration in time range)  cefTRIAXone (ROCEPHIN) 1 g in sodium chloride 0.9 % 100 mL IVPB (has no administration in time range)  azithromycin (ZITHROMAX) 500 mg in sodium chloride 0.9 % 250 mL IVPB (has no administration in time range)  lactated ringers infusion (has no administration in time range)  lactated ringers bolus 2,500 mL (has no administration in time range)    ED Course  I have reviewed the triage vital signs and the nursing notes.  Pertinent labs & imaging results that were available during my care of the patient were reviewed by me and considered in my medical decision making (see chart for details).    MDM Rules/Calculators/A&P                         MDM:  Pt has a temp of 101 bp low,  Sepsis orders started.   Lactic acid returned and is  6.6.  Hemoglobin is 6.8. Sodium is 158.  Bun and creatine are elevated.  Pt has is no code.  Iv fluids and antibiotics okay per Nursing home paperwork.  Endotool ordered, iv fluids ordered, antibiotics started, Blood transfusion ordered   Final Clinical Impression(s) / ED Diagnoses Final diagnoses:  Fever, unspecified fever cause  Community acquired pneumonia, unspecified laterality  Sepsis, due to unspecified organism, unspecified whether acute organ dysfunction present [facility])    Rx / DC Orders ED Discharge Orders    None       Sidney Ace 03/18/20 2055    Sidney Ace 03/18/20 2131    Milton Ferguson, MD 03/19/20 1530

## 2020-03-18 NOTE — ED Notes (Signed)
Date and time results received: 03/18/20 7:25 PM  (use smartphrase ".now" to insert current time)  Test: Hem Critical Value: 6.8  Name of Provider Notified: PA Sofia  Orders Received? Or Actions Taken?: N/a

## 2020-03-18 NOTE — Progress Notes (Signed)
Pharmacy Antibiotic Note  Paul Perez is a 86 y.o. male admitted on 03/18/2020 with sepsis.  Pharmacy has been consulted for Vancomycin/Cefepime dosing. Pt recently had Pseudomonas bacteremia and UTI. WBC WNL. Noted renal dysfunction.   Plan: Vancomycin 1000 mg IV q48h Cefepime 2g IV q24h Trend WBC, temp, renal function  F/U infectious work-up Drug levels as indicated   Height: 6' (182.9 cm) Weight: 83 kg (182 lb 15.7 oz) IBW/kg (Calculated) : 77.6  Temp (24hrs), Avg:99.2 F (37.3 C), Min:98.2 F (36.8 C), Max:101 F (38.3 C)  Recent Labs  Lab 03/18/20 1858 03/18/20 2113  WBC 10.3  --   CREATININE 3.27*  --   LATICACIDVEN 6.6* 6.2*    Estimated Creatinine Clearance: 16.5 mL/min (A) (by C-G formula based on SCr of 3.27 mg/dL (H)).    No Known Allergies  Narda Bonds, PharmD, BCPS Clinical Pharmacist Phone: [phone]

## 2020-03-18 NOTE — ED Notes (Signed)
Sodium 161 reported to [facility] MD and Sharyon Cable RN

## 2020-03-19 ENCOUNTER — Encounter (HOSPITAL_COMMUNITY): Payer: Self-pay | Admitting: Internal Medicine

## 2020-03-19 DIAGNOSIS — F0390 Unspecified dementia without behavioral disturbance: Secondary | ICD-10-CM

## 2020-03-19 DIAGNOSIS — J189 Pneumonia, unspecified organism: Secondary | ICD-10-CM

## 2020-03-19 DIAGNOSIS — Z7189 Other specified counseling: Secondary | ICD-10-CM

## 2020-03-19 DIAGNOSIS — R195 Other fecal abnormalities: Secondary | ICD-10-CM | POA: Insufficient documentation

## 2020-03-19 DIAGNOSIS — D649 Anemia, unspecified: Secondary | ICD-10-CM | POA: Insufficient documentation

## 2020-03-19 DIAGNOSIS — G9341 Metabolic encephalopathy: Secondary | ICD-10-CM

## 2020-03-19 DIAGNOSIS — Z66 Do not resuscitate: Secondary | ICD-10-CM

## 2020-03-19 DIAGNOSIS — Z515 Encounter for palliative care: Secondary | ICD-10-CM

## 2020-03-19 LAB — CBG MONITORING, ED
Glucose-Capillary: 111 mg/dL — ABNORMAL HIGH (ref 70–99)
Glucose-Capillary: 112 mg/dL — ABNORMAL HIGH (ref 70–99)
Glucose-Capillary: 176 mg/dL — ABNORMAL HIGH (ref 70–99)
Glucose-Capillary: 82 mg/dL (ref 70–99)
Glucose-Capillary: 169 mg/dL — ABNORMAL HIGH (ref 70–99)
Glucose-Capillary: 276 mg/dL — ABNORMAL HIGH (ref 70–99)
Glucose-Capillary: 194 mg/dL — ABNORMAL HIGH (ref 70–99)

## 2020-03-19 LAB — BASIC METABOLIC PANEL
Anion gap: 14 (ref 5–15)
Anion gap: 12 (ref 5–15)
BUN: 79 mg/dL — ABNORMAL HIGH (ref 8–23)
BUN: 76 mg/dL — ABNORMAL HIGH (ref 8–23)
CO2: 19 mmol/L — ABNORMAL LOW (ref 22–32)
CO2: 20 mmol/L — ABNORMAL LOW (ref 22–32)
Calcium: 8.3 mg/dL — ABNORMAL LOW (ref 8.9–10.3)
Calcium: 8.3 mg/dL — ABNORMAL LOW (ref 8.9–10.3)
Chloride: 129 mmol/L — ABNORMAL HIGH (ref 98–111)
Chloride: 127 mmol/L — ABNORMAL HIGH (ref 98–111)
Creatinine, Ser: 2.77 mg/dL — ABNORMAL HIGH (ref 0.61–1.24)
Creatinine, Ser: 2.72 mg/dL — ABNORMAL HIGH (ref 0.61–1.24)
GFR, Estimated: 21 mL/min — ABNORMAL LOW (ref >60)
GFR, Estimated: 22 mL/min — ABNORMAL LOW (ref >60)
Glucose, Bld: 325 mg/dL — ABNORMAL HIGH (ref 70–99)
Glucose, Bld: 351 mg/dL — ABNORMAL HIGH (ref 70–99)
Potassium: 4.6 mmol/L (ref 3.5–5.1)
Potassium: 3.9 mmol/L (ref 3.5–5.1)
Sodium: 162 mmol/L (ref 135–145)
Sodium: 159 mmol/L — ABNORMAL HIGH (ref 135–145)

## 2020-03-19 LAB — COMPREHENSIVE METABOLIC PANEL
ALT: 29 U/L (ref 0–44)
AST: 35 U/L (ref 15–41)
Albumin: 2 g/dL — ABNORMAL LOW (ref 3.5–5.0)
Alkaline Phosphatase: 58 U/L (ref 38–126)
Anion gap: 12 (ref 5–15)
BUN: 76 mg/dL — ABNORMAL HIGH (ref 8–23)
CO2: 22 mmol/L (ref 22–32)
Calcium: 8.4 mg/dL — ABNORMAL LOW (ref 8.9–10.3)
Chloride: 129 mmol/L — ABNORMAL HIGH (ref 98–111)
Creatinine, Ser: 2.7 mg/dL — ABNORMAL HIGH (ref 0.61–1.24)
GFR, Estimated: 22 mL/min — ABNORMAL LOW (ref >60)
Glucose, Bld: 221 mg/dL — ABNORMAL HIGH (ref 70–99)
Potassium: 4.7 mmol/L (ref 3.5–5.1)
Sodium: 163 mmol/L (ref 135–145)
Total Bilirubin: 0.8 mg/dL (ref 0.3–1.2)
Total Protein: 5.5 g/dL — ABNORMAL LOW (ref 6.5–8.1)

## 2020-03-19 LAB — URINALYSIS, ROUTINE W REFLEX MICROSCOPIC
Bacteria, UA: NONE SEEN
Bilirubin Urine: NEGATIVE
Glucose, UA: >=500 mg/dL — AB
Ketones, ur: NEGATIVE mg/dL
Nitrite: POSITIVE — AB
Protein, ur: 30 mg/dL — AB
Specific Gravity, Urine: 1.016 (ref 1.005–1.030)
pH: 6 (ref 5.0–8.0)

## 2020-03-19 LAB — MAGNESIUM: Magnesium: 2.5 mg/dL — ABNORMAL HIGH (ref 1.7–2.4)

## 2020-03-19 LAB — APTT: aPTT: 29 seconds (ref 24–36)

## 2020-03-19 LAB — CBC
HCT: 23.5 % — ABNORMAL LOW (ref 39.0–52.0)
Hemoglobin: 6.7 g/dL — CL (ref 13.0–17.0)
MCH: 27.3 pg (ref 26.0–34.0)
MCHC: 28.5 g/dL — ABNORMAL LOW (ref 30.0–36.0)
MCV: 95.9 fL (ref 80.0–100.0)
Platelets: 257 10*3/uL (ref 150–400)
RBC: 2.45 MIL/uL — ABNORMAL LOW (ref 4.22–5.81)
RDW: 15.3 % (ref 11.5–15.5)
WBC: 11.1 10*3/uL — ABNORMAL HIGH (ref 4.0–10.5)
nRBC: 0.5 % — ABNORMAL HIGH (ref 0.0–0.2)

## 2020-03-19 LAB — HEMOGLOBIN AND HEMATOCRIT, BLOOD
HCT: 24.2 % — ABNORMAL LOW (ref 39.0–52.0)
HCT: 23.5 % — ABNORMAL LOW (ref 39.0–52.0)
Hemoglobin: 6.9 g/dL — CL (ref 13.0–17.0)
Hemoglobin: 6.6 g/dL — CL (ref 13.0–17.0)

## 2020-03-19 LAB — PREPARE RBC (CROSSMATCH)

## 2020-03-19 LAB — GLUCOSE, CAPILLARY
Glucose-Capillary: 272 mg/dL — ABNORMAL HIGH (ref 70–99)
Glucose-Capillary: 284 mg/dL — ABNORMAL HIGH (ref 70–99)

## 2020-03-19 LAB — PHOSPHORUS: Phosphorus: 3.1 mg/dL (ref 2.5–4.6)

## 2020-03-19 LAB — LACTIC ACID, PLASMA
Lactic Acid, Venous: 2.1 mmol/L (ref 0.5–1.9)
Lactic Acid, Venous: 1.3 mmol/L (ref 0.5–1.9)

## 2020-03-19 LAB — PROTIME-INR
INR: 1.4 — ABNORMAL HIGH (ref 0.8–1.2)
Prothrombin Time: 17.1 seconds — ABNORMAL HIGH (ref 11.4–15.2)

## 2020-03-19 LAB — PROCALCITONIN: Procalcitonin: 2.46 ng/mL

## 2020-03-19 LAB — POC OCCULT BLOOD, ED: Fecal Occult Bld: POSITIVE — AB

## 2020-03-19 MED ORDER — DEXTROSE-NACL 5-0.45 % IV SOLN: INTRAVENOUS | Status: DC

## 2020-03-19 MED ORDER — HALOPERIDOL LACTATE 5 MG/ML IJ SOLN
1.0000 mg | Freq: Four times a day (QID) | INTRAMUSCULAR | Status: DC | PRN
  Administered 2020-03-19: 1 mg via INTRAVENOUS
  Filled 2020-03-19: qty 1

## 2020-03-19 MED ORDER — KCL IN DEXTROSE-NACL 20-5-0.45 MEQ/L-%-% IV SOLN
INTRAVENOUS | Status: DC
  Filled 2020-03-19 (×4): qty 1000

## 2020-03-19 MED ORDER — INSULIN ASPART 100 UNIT/ML ~~LOC~~ SOLN
0.0000 [IU] | SUBCUTANEOUS | Status: DC
  Administered 2020-03-19: 2 [IU] via SUBCUTANEOUS
  Administered 2020-03-19 (×3): 5 [IU] via SUBCUTANEOUS
  Administered 2020-03-20: 2 [IU] via SUBCUTANEOUS
  Administered 2020-03-20: 7 [IU] via SUBCUTANEOUS
  Administered 2020-03-20: 3 [IU] via SUBCUTANEOUS
  Administered 2020-03-20: 1 [IU] via SUBCUTANEOUS
  Administered 2020-03-20 (×2): 3 [IU] via SUBCUTANEOUS
  Administered 2020-03-21: 1 [IU] via SUBCUTANEOUS
  Administered 2020-03-21 (×2): 2 [IU] via SUBCUTANEOUS
  Administered 2020-03-21: 5 [IU] via SUBCUTANEOUS
  Administered 2020-03-21: 2 [IU] via SUBCUTANEOUS
  Administered 2020-03-21: 3 [IU] via SUBCUTANEOUS
  Administered 2020-03-22: 2 [IU] via SUBCUTANEOUS
  Administered 2020-03-22 (×2): 3 [IU] via SUBCUTANEOUS
  Administered 2020-03-22 (×2): 2 [IU] via SUBCUTANEOUS
  Administered 2020-03-22: 3 [IU] via SUBCUTANEOUS
  Administered 2020-03-23: 2 [IU] via SUBCUTANEOUS
  Administered 2020-03-23: 3 [IU] via SUBCUTANEOUS
  Administered 2020-03-23: 2 [IU] via SUBCUTANEOUS
  Administered 2020-03-23: 1 [IU] via SUBCUTANEOUS
  Administered 2020-03-23: 2 [IU] via SUBCUTANEOUS
  Administered 2020-03-23: 3 [IU] via SUBCUTANEOUS
  Administered 2020-03-24: 2 [IU] via SUBCUTANEOUS
  Administered 2020-03-24: 7 [IU] via SUBCUTANEOUS
  Administered 2020-03-24: 3 [IU] via SUBCUTANEOUS
  Administered 2020-03-24: 5 [IU] via SUBCUTANEOUS
  Administered 2020-03-24: 1 [IU] via SUBCUTANEOUS
  Administered 2020-03-25: 2 [IU] via SUBCUTANEOUS
  Administered 2020-03-25 (×2): 1 [IU] via SUBCUTANEOUS
  Administered 2020-03-25 (×2): 2 [IU] via SUBCUTANEOUS
  Administered 2020-03-25: 1 [IU] via SUBCUTANEOUS
  Administered 2020-03-26: 2 [IU] via SUBCUTANEOUS
  Administered 2020-03-26: 3 [IU] via SUBCUTANEOUS
  Administered 2020-03-26: 2 [IU] via SUBCUTANEOUS
  Filled 2020-03-19 (×2): qty 1

## 2020-03-19 MED ORDER — SODIUM CHLORIDE 0.9% IV SOLUTION: Freq: Once | INTRAVENOUS | Status: DC

## 2020-03-19 MED ORDER — SODIUM CHLORIDE 0.9% IV SOLUTION: Freq: Once | INTRAVENOUS | Status: AC

## 2020-03-19 MED ORDER — SODIUM CHLORIDE 0.45 % IV SOLN: INTRAVENOUS | Status: DC

## 2020-03-19 MED ORDER — MORPHINE SULFATE (PF) 2 MG/ML IV SOLN
0.5000 mg | Freq: Three times a day (TID) | INTRAVENOUS | Status: DC | PRN
  Administered 2020-03-19 – 2020-03-20 (×2): 0.5 mg via INTRAVENOUS
  Filled 2020-03-19 (×2): qty 1

## 2020-03-19 NOTE — Progress Notes (Signed)
SLP Cancellation Note  Patient Details Name: Paul Perez MRN: [id number] DOB: 1927-07-26   Cancelled treatment:       Reason Eval/Treat Not Completed: Patient's level of consciousness; Pt roused to voice and allowed oral care and a single ice chip before moaning and back to sleep. Pt known to SLP service from previous admission last month. Pt was discharged on a mechanical soft diet and nectar-thick liquids. SLP will check back tomorrow for BSE.  Thank you,  Genene Churn, [place]   [place] 03/19/2020, 8:54 PM

## 2020-03-19 NOTE — ED Notes (Signed)
CRITICAL VALUE ALERT  Critical Value:  Sodium 162  Date & Time Notied:  03/19/20 & 0945hrs  Provider Notified: Dr. Karleen Hampshire  Orders Received/Actions taken: notified

## 2020-03-19 NOTE — Progress Notes (Signed)
   03/19/20 1600  Assess: MEWS Score  BP (!) 153/62  Pulse Rate (!) 53  ECG Heart Rate (!) 54  Resp 14  SpO2 100 %  Assess: MEWS Score  MEWS Temp 0  MEWS Systolic 0  MEWS Pulse 0  MEWS RR 0  MEWS LOC 2  MEWS Score 2  MEWS Score Color Yellow  Assess: if the MEWS score is Yellow or Red  Were vital signs taken at a resting state? No  Focused Assessment No change from prior assessment  Early Detection of Sepsis Score *See Row Information* High  MEWS guidelines implemented *See Row Information* No, previously yellow, continue vital signs every 4 hours  Notify: Charge Nurse/RN  Name of Charge Nurse/RN Notified Emily, RN  Date Charge Nurse/RN Notified 03/19/20  Time Charge Nurse/RN Notified 1627  Document  Patient Outcome Other (Comment) (Patient was agitated and no change from ED)  Progress note created (see row info) Yes

## 2020-03-19 NOTE — ED Notes (Signed)
Verbal order to pause insulin gtt until 0400.

## 2020-03-19 NOTE — ED Notes (Signed)
Sodium 163 reported to [facility] MD and Sharyon Cable

## 2020-03-19 NOTE — Progress Notes (Signed)
CRITICAL VALUE ALERT  Critical Value:  Hgb 6.6  Date & Time Notied:  03/19/20 2210  Provider Notified: X. Blount  Orders Received/Actions taken:

## 2020-03-19 NOTE — Progress Notes (Signed)
PROGRESS NOTE    Paul Perez  HQI:[id number] DOB: March 23, 1928 DOA: 03/18/2020 PCP: Alanson Puls [facility]    Chief Complaint  Patient presents with  . Hyperglycemia    Brief Narrative:  86 year old male prior history of type 2 diabetes, hypertension, stage IV CKD, dementia, GI bleed presents to ED from [facility] for confusion and fever and elevated blood sugars.  Patient unable to provide any history due to underlying dementia.  Patient was found to have right sided pneumonia/ infrahilar pneumonia and urinary tract infection and admitted for severe sepsis. He was also found to be anemic and stool for occult blood is positive. GI consulted , recommended transfusions as needed and conservative management.  In view of his severe sepsis,   Assessment & Plan:   Principal Problem:   Severe sepsis ([place]) Active Problems:   Hypertension   Hypernatremia   Diabetes mellitus type 2, uncontrolled, with complications (HCC)   Dehydration   CKD (chronic kidney disease) stage 4, GFR 15-29 ml/min (HCC)   Acute febrile illness   Hyperosmolar hyperglycemic state (HHS) (HCC)   GI bleed   Sacral decubitus ulcer, stage II ([place])   Abrasion of left leg   Scrotal skin lesion   Hypoalbuminemia   Acute metabolic encephalopathy     Acute metabolic encephalopathy: Superimposed on baseline dementia , probably secondary to severe sepsis from UTI, Pneumonia.  Pt still agitated and confused.  Currently NPO and SLP eval ordered.     Severe Sepsis Pt on admission, was febrile, tachypneic RR >25/min, with AKI, lactic acid of 6.6, was found to have pneumonia and UTI, gram negative bacteremia.   He remains on RA.  Started the patient on IV vancomycin and IV cefepime.  Continue the same, watch for sensitivities.      Hyperglycemia sec to poorly controlled type 2 DM:  He was started on IV insulin and transitioned to SSI.      Acute anemia of blood loss possibly GI source.  Hemoccult is  positive.  2 units of prbc transfusion ordered. He received 1 unit, 2nd unit pending.  GI consulted and recommendations given.  Continue with IV PPI BID.    Acute on Stage IV CKD,  Monitor renal parameters while on IV fluids.  Avoid nephrotoxic agents/ dehydration/ hypotension.     Stage 2 sacral decubitus ulcer:  Wound care consulted.    Oropharyngeal dysphagia:  SLP evaluation ordered.    Hypoalbuminemia sec to mod protein calorie mod Dietary consulted.    In view of his multiple medical problems, declining health, severe sepsis, dementia, dysphagia, hypernatremia, palliative care consulted for goals of care.      DVT prophylaxis: SCD'S Code Status:DNR.  Family Communication: discussed with daughter over the phone.  Disposition:   Status is: Inpatient  Remains inpatient appropriate because:Ongoing diagnostic testing needed not appropriate for outpatient work up, IV treatments appropriate due to intensity of illness or inability to take PO and Inpatient level of care appropriate due to severity of illness   Dispo: The patient is from: SNF              Anticipated d/c is to: SNF              Anticipated d/c date is: 2 days              Patient currently is not medically stable to d/c.       Consultants:   Palliative care  Gastroenterology.   Procedures: none.  Antimicrobials:  Antibiotics Given (last 72 hours)    Date/Time Action Medication Dose Rate   03/18/20 2003 New Bag/Given   azithromycin (ZITHROMAX) 500 mg in sodium chloride 0.9 % 250 mL IVPB 500 mg 250 mL/hr   03/18/20 2008 New Bag/Given   cefTRIAXone (ROCEPHIN) 1 g in sodium chloride 0.9 % 100 mL IVPB 1 g 200 mL/hr   03/18/20 2311 New Bag/Given   ceFEPIme (MAXIPIME) 2 g in sodium chloride 0.9 % 100 mL IVPB 2 g 200 mL/hr   03/18/20 2330 New Bag/Given   vancomycin (VANCOREADY) IVPB 1500 mg/300 mL 1,500 mg 150 mL/hr          Subjective: Pt agitated .   Objective: Vitals:    03/19/20 1407 03/19/20 1430 03/19/20 1500 03/19/20 1530  BP: (!) 147/61 (!) 152/84 114/88 (!) 155/103  Pulse: 65 64 (!) 56 (!) 54  Resp: 18 18 17 13   Temp:      TempSrc:      SpO2: 100% 100% 100% 100%  Weight:      Height:        Intake/Output Summary (Last 24 hours) at 03/19/2020 1603 Last data filed at 03/19/2020 1413 Gross per 24 hour  Intake 5800.15 ml  Output 200 ml  Net 5600.15 ml   Filed Weights   03/18/20 1834  Weight: 83 kg    Examination:  General exam: Appears calm and comfortable  Respiratory system: scattered rhonchi.  Cardiovascular system: S1 & S2 heard, tachycardic,  Gastrointestinal system: Abdomen is nondistended, soft and nontender. Central nervous system: pt confused and agitated.  Extremities: Symmetric 5 x 5 power. Skin: sacral decubitus ulcer.  Psychiatry:cannot be assessed.     Data Reviewed: I have personally reviewed following labs and imaging studies  CBC: Recent Labs  Lab 03/18/20 1858 03/19/20 0419 03/19/20 1259  WBC 10.3 11.1*  --   NEUTROABS 8.5*  --   --   HGB 6.8* 6.7* 6.9*  HCT 24.0* 23.5* 24.2*  MCV 92.3 95.9  --   PLT 342 257  --     Basic Metabolic Panel: Recent Labs  Lab 03/18/20 1858 03/18/20 2113 03/19/20 0419 03/19/20 0909  NA 158* 161* 163* 162*  K 4.2 4.2 4.7 4.6  CL 122* 125* 129* 129*  CO2 21* 22 22 19*  GLUCOSE 678* 352* 221* 325*  BUN 83* 83* 76* 79*  CREATININE 3.27* 3.17* 2.70* 2.77*  CALCIUM 9.2 9.0 8.4* 8.3*  MG  --   --  2.5*  --   PHOS  --   --  3.1  --     GFR: Estimated Creatinine Clearance: 19.5 mL/min (A) (by C-G formula based on SCr of 2.77 mg/dL (H)).  Liver Function Tests: Recent Labs  Lab 03/18/20 1858 03/19/20 0419  AST 46* 35  ALT 33 29  ALKPHOS 71 58  BILITOT 0.6 0.8  PROT 6.3* 5.5*  ALBUMIN 2.2* 2.0*    CBG: Recent Labs  Lab 03/19/20 0159 03/19/20 0302 03/19/20 0415 03/19/20 0835 03/19/20 1237  GLUCAP 82 111* 169* 276* 194*     Recent Results (from the past  240 hour(s))  Respiratory Panel by RT PCR (Flu A&B, Covid) - Nasopharyngeal Swab     Status: None   Collection Time: 03/18/20  6:58 PM   Specimen: Nasopharyngeal Swab  Result Value Ref Range Status   SARS Coronavirus 2 by RT PCR NEGATIVE NEGATIVE Final    Comment: (NOTE) SARS-CoV-2 target nucleic acids are NOT DETECTED.  The SARS-CoV-2 RNA  is generally detectable in upper respiratoy specimens during the acute phase of infection. The lowest concentration of SARS-CoV-2 viral copies this assay can detect is 131 copies/mL. A negative result does not preclude SARS-Cov-2 infection and should not be used as the sole basis for treatment or other patient management decisions. A negative result may occur with  improper specimen collection/handling, submission of specimen other than nasopharyngeal swab, presence of viral mutation(s) within the areas targeted by this assay, and inadequate number of viral copies (<131 copies/mL). A negative result must be combined with clinical observations, patient history, and epidemiological information. The expected result is Negative.  Fact Sheet for Patients:  PinkCheek.be  Fact Sheet for Healthcare Providers:  GravelBags.it  This test is no t yet approved or cleared by the [place] FDA and  has been authorized for detection and/or diagnosis of SARS-CoV-2 by FDA under an Emergency Use Authorization (EUA). This EUA will remain  in effect (meaning this test can be used) for the duration of the COVID-19 declaration under Section 564(b)(1) of the Act, 21 U.S.C. section 360bbb-3(b)(1), unless the authorization is terminated or revoked sooner.     Influenza A by PCR NEGATIVE NEGATIVE Final   Influenza B by PCR NEGATIVE NEGATIVE Final    Comment: (NOTE) The Xpert Xpress SARS-CoV-2/FLU/RSV assay is intended as an aid in  the diagnosis of influenza from Nasopharyngeal swab specimens and  should  not be used as a sole basis for treatment. Nasal washings and  aspirates are unacceptable for Xpert Xpress SARS-CoV-2/FLU/RSV  testing.  Fact Sheet for Patients: PinkCheek.be  Fact Sheet for Healthcare Providers: GravelBags.it  This test is not yet approved or cleared by the [place] FDA and  has been authorized for detection and/or diagnosis of SARS-CoV-2 by  FDA under an Emergency Use Authorization (EUA). This EUA will remain  in effect (meaning this test can be used) for the duration of the  Covid-19 declaration under Section 564(b)(1) of the Act, 21  U.S.C. section 360bbb-3(b)(1), unless the authorization is  terminated or revoked. Performed at [facility], [address], [place], [place] [address]   Culture, blood (Routine x 2)     Status: None (Preliminary result)   Collection Time: 03/18/20  7:39 PM   Specimen: BLOOD RIGHT ARM  Result Value Ref Range Status   Specimen Description BLOOD RIGHT ARM  Final   Special Requests   Final    BOTTLES DRAWN AEROBIC AND ANAEROBIC Blood Culture adequate volume   Culture  Setup Time   Final    GRAM POSITIVE RODS Gram Stain Report Called to,Read Back By and Verified With: KATIE SITES @1257  03/19/20 BY JONES,T  ANAEROBIC BOTTLE ONLY [facility]    Culture   Final    NO GROWTH < 24 HOURS Performed at [facility], [address], [place], [place] [address]    Report Status PENDING  Incomplete  Culture, blood (Routine x 2)     Status: None (Preliminary result)   Collection Time: 03/18/20  7:39 PM   Specimen: BLOOD RIGHT ARM  Result Value Ref Range Status   Specimen Description BLOOD RIGHT ARM  Final   Special Requests   Final    BOTTLES DRAWN AEROBIC AND ANAEROBIC Blood Culture adequate volume   Culture   Final    NO GROWTH < 24 HOURS Performed at [facility], [address] [place], [place] [address]    Report Status PENDING  Incomplete         Radiology Studies:  DG  Chest 1 View  Result Date: 03/18/2020 CLINICAL DATA:  Altered mental status, elevated glucose EXAM: CHEST  1 VIEW COMPARISON:  Radiograph 02/26/2020 FINDINGS: More coalescent patchy opacities seen in the right medial lung base/infrahilar lung. Additional atelectatic changes and vascular crowding are present as well. Vascularity remains fairly well-defined. No pneumothorax or effusion. The aorta is calcified. The remaining cardiomediastinal contours are unremarkable. No acute osseous or soft tissue abnormality. Degenerative changes are present in the imaged spine and shoulders. Telemetry leads overlie the chest. IMPRESSION: More coalescent patchy opacities in the right medial lung base/infrahilar lung could reflect pneumonia in the appropriate clinical setting. Additional atelectatic changes elsewhere. Aortic Atherosclerosis (ICD10-I70.0). Electronically Signed   By: Lovena Le M.D.   On: 03/18/2020 19:22        Scheduled Meds: . sodium chloride   Intravenous Once  . insulin aspart  0-9 Units Subcutaneous Q4H  . [START ON 03/22/2020] pantoprazole  40 mg Intravenous Q12H   Continuous Infusions: . sodium chloride Stopped (03/18/20 2326)  . ceFEPime (MAXIPIME) IV Stopped (03/18/20 2312)  . dextrose 5 % and 0.45% NaCl 100 mL/hr at 03/19/20 1313  . pantoprozole (PROTONIX) infusion 8 mg/hr (03/19/20 1004)  . [START ON 03/20/2020] vancomycin       LOS: 1 day        Hosie Poisson, MD [facility]   To contact the attending provider between 7A-7P or the covering provider during after hours 7P-7A, please log into the web site [email or web address] and access using universal [place] password for that web site. If you do not have the password, please call the hospital operator.  03/19/2020, 4:03 PM

## 2020-03-19 NOTE — ED Notes (Signed)
CRITICAL VALUE ALERT  Critical Value:  Hgb 6.9  Date & Time Notied:  03/19/2020 1410  Provider Notified: Dr. Karleen Hampshire  Orders Received/Actions taken: see chart

## 2020-03-19 NOTE — TOC Initial Note (Signed)
Transition of Care [facility]) - Initial/Assessment Note    Patient Details  Name: Paul Perez MRN: [id number] Date of Birth: 05/24/1927  [facility] CM/SW Contact:    Iona Beard, [place] Phone Number: 03/19/2020, 3:38 PM  Clinical Narrative:                 Pt admitted for severe sepsis. Pt is from [place] for SNF. Pt high risk for readmission. CSW spoke with pts son Paul Perez [phone] to complete assessment due to pt being oriented to person only. Pts son states that pts MD will be providing family with an update in 66 hours. Pts son states that the plan at d/c will either be for pt to go to [facility] or to go back to [place]. Pts son would like to make decision after speaking with MD tomorrow. CSW contacted Debbie with [facility] to confirm pt could return if family makes that decision. Per Jackelyn Poling pt can return but insurance Josem Kaufmann will have to be restarted. TOC to follow.   Expected Discharge Plan: [place] Barriers to Discharge: Continued Medical Work up   Patient Goals and CMS Choice Patient states their goals for this hospitalization and ongoing recovery are:: Family is waiting to consult with MD about hospice vs SNF   Choice offered to / list presented to : NA  Expected Discharge Plan and Services Expected Discharge Plan: [place] In-house Referral: Clinical Social Work, Hospice / Palliative Care Discharge Planning Services: NA Post Acute Care Choice: Hospice Living arrangements for the past 2 months: [place]                 DME Arranged: N/A DME Agency: NA       HH Arranged: NA [place] Agency: NA        Prior Living Arrangements/Services Living arrangements for the past 2 months: [place] Lives with:: Self Patient language and need for interpreter reviewed:: Yes Do you feel safe going back to the place where you live?: Yes      Need for Family Participation in  Patient Care: Yes (Comment) Coulter, [place] (Son) [phone]) Care giver support system in place?: Yes (comment)   Criminal Activity/Legal Involvement Pertinent to Current Situation/Hospitalization: No - Comment as needed  Activities of Daily Living      Permission Sought/Granted                  Emotional Assessment Appearance:: Appears stated age Attitude/Demeanor/Rapport: Unable to Assess Affect (typically observed): Unable to Assess Orientation: : Oriented to Self Alcohol / Substance Use: Not Applicable Psych Involvement: No (comment)  Admission diagnosis:  Acute febrile illness [W43.1] Acute metabolic encephalopathy [V40.08] Patient Active Problem List   Diagnosis Date Noted  . Acute metabolic encephalopathy 67/61/9509  . Heme positive stool   . Acute on chronic anemia   . Acute febrile illness 03/18/2020  . Hyperosmolar hyperglycemic state (HHS) ([place]) 03/18/2020  . GI bleed 03/18/2020  . Sacral decubitus ulcer, stage II ([place]) 03/18/2020  . Abrasion of left leg 03/18/2020  . Scrotal skin lesion 03/18/2020  . Hypoalbuminemia 03/18/2020  . Palliative care by specialist   . Goals of care, counseling/discussion   . DNR (do not resuscitate)   . Abdominal distension 03/01/2020  . Oropharyngeal dysphagia 02/29/2020  . AF (paroxysmal atrial fibrillation) ([place]) 02/26/2020  . Bacteremia due to Pseudomonas 02/26/2020  . Severe sepsis ([place]) 02/22/2020  . CKD (chronic kidney disease) stage 4, GFR  15-29 ml/min ([place]) 12/21/2017  . Anemia 12/21/2017  . Dementia ([place]) 09/28/2017  . Leg edema 09/28/2017  . Acute on chronic diastolic CHF (congestive heart failure) ([place]) 09/27/2017  . Acute renal failure superimposed on stage 3 chronic kidney disease ([place])   . Dehydration   . Elevated troponin 08/25/2017  . Hypernatremia 08/25/2017  . Hyperlipidemia 08/25/2017  . Diabetes mellitus type 2, uncontrolled, with complications ([place]) 44/31/5400  . Bradycardia 08/28/2013  . PAC  (premature atrial contraction) 08/28/2013  . Altered mental state 08/20/2013  . Acute encephalopathy 08/20/2013  . Hypertension 08/20/2013  . Rhabdomyolysis 08/20/2013  . UTI (lower urinary tract infection) 08/20/2013   PCP:  Alanson Puls [place]:   [place], [address] [place] [address] Phone: [phone] Fax: [phone]     Social Determinants of Health (SDOH) Interventions    Readmission Risk Interventions Readmission Risk Prevention Plan 03/19/2020  Transportation Screening Complete  Medication Review Press photographer) Complete  HRI or Home Care Consult Complete  SW Recovery Care/Counseling Consult Complete  Palliative Care Screening Complete  Skilled Nursing Facility Complete  Some recent data might be hidden

## 2020-03-19 NOTE — Consult Note (Signed)
Referring Provider: Hosie Poisson, MD Primary Care Physician:  Alanson Puls [facility] Primary Gastroenterologist:  Hildred Laser, MD  Reason for Consultation: GI bleed  HPI: Paul Perez is a 86 y.o. male with past medical history significant for dementia, type 2 diabetes mellitus, hypertension, chronic kidney disease stage IV, premature atrial contractions, history of remote GI bleeding presenting to the emergency department from [facility] via EMS with elevated blood glucose level, confusion and fever.  Noted to have temperature of 101, glucose over 500.  In the emergency department his temperature was 101.  He was tachypneic with respiratory rate of 25/min.  Blood pressure 154/61.  O2 sats 100% on room air.  RSV panel for influenza A, B and SARS coronavirus 2 were all negative.  He was noted to have a hemoglobin of 6.8, hematocrit 24, BUN 83 (up from 49 2 weeks prior), creatinine 3.27 (up from 2.112 weeks prior), lactic acid 6.6, sodium 158, glucose 678, AST 46, albumin 2.2 otherwise LFTs normal.  INR 1.4 sodium increased to 161.  He was heme positive.  Blood cultures pending.  Chest x-ray with more coalescent patchy opacities in the right middle lung base/infrahilar lung could reflect pneumonia.    Today's sodium is 162, chloride 129, glucose 325, BUN 79, creatinine 2.77, lactic acid 1.3 urinalysis with leukocytes, blood, nitrites.  Urine culture pending.  Recent admission October 2021 with severe sepsis secondary to Pseudomonas bacteremia.  Also noted to have mild oropharyngeal dysphagia evaluated with modified barium swallow October 13 and recommended dysphagia 3, nectar thick liquids.  DKA's as well.  A1c over 15.  GI consulted for GI bleed.  On admission patient noted to have worsening anemia.  At baseline his hemoglobin appears to be in the 9 -10 range.  At time of discharge on October 20 hemoglobin was 7.7.  Presented yesterday with hemoglobin of 6.8.  Noted to  have heme positive stools.  No reported overt GI bleeding.  Patient is unable to provide adequate history due to his confusion/dementia.  Chronically on iron.  EGD 2004 by Dr. Laural Golden FINAL DIAGNOSES:  1. Duodenal/bulbar ulcer with arterial spurting.  Hemostasis secured with     injection therapy followed by coagulation with a gold probe.  2. Gastritis with duodenitis.  3. Bulbar pseudodiverticula   Prior to Admission medications   Medication Sig Start Date End Date Taking? Authorizing Provider  acetaminophen (TYLENOL) 650 MG CR tablet Take 650 mg by mouth every 8 (eight) hours as needed for pain.   Yes [provider]  amLODipine (NORVASC) 5 MG tablet Take 1 tablet (5 mg total) by mouth daily. 03/06/20  Yes Nita Sells, MD  Roseanne Kaufman [place]-Castor Oil [facility]) OINT Apply liberal amount to coccyx, sacrum and bilateral buttocks q shift and prn after incontinence episodes for skin protection   Yes [provider]  busPIRone (BUSPAR) 5 MG tablet Take 1 tablet (5 mg total) by mouth 2 (two) times daily. 03/06/20  Yes Nita Sells, MD  cloNIDine (CATAPRES - DOSED IN MG/24 HR) 0.1 mg/24hr patch Place 1 patch (0.1 mg total) onto the skin once a week. 03/08/20  Yes Nita Sells, MD  insulin lispro (ADMELOG) 100 UNIT/ML injection Inject 2-12 Units into the skin See admin instructions. Inject as per sliding scale: if 201-250=2units; 251-300=4 units;301-350-=6 units;351-400=8 units;401-450=10 units; 451-500=12 units.   Yes [provider]  iron polysaccharides (NIFEREX) 150 MG capsule Take 150 mg by mouth daily.   Yes [provider]  melatonin 3  MG TABS tablet Take 1 tablet (3 mg total) by mouth at bedtime as needed (insomnia). 03/06/20  Yes Nita Sells, MD  metoprolol tartrate (LOPRESSOR) 25 MG tablet Take 1 tablet (25 mg total) by mouth 2 (two) times daily. 03/06/20  Yes Nita Sells, MD  pioglitazone (ACTOS) 15 MG tablet Take 1  tablet by mouth daily. 12/01/16  Yes [provider]  senna (SENOKOT) 8.6 MG TABS tablet Take 1 tablet (8.6 mg total) by mouth 2 (two) times daily. 03/06/20  Yes Nita Sells, MD  Vitamin D, Cholecalciferol, 1000 units TABS Take 1,000 Units by mouth daily.    Yes [provider]  donepezil (ARICEPT) 5 MG tablet Take 5 mg by mouth at bedtime.    [provider]  food thickener (THICK IT) POWD Take 1 Container by mouth as needed (as needed to thicken liquids). 03/06/20   Nita Sells, MD  polyethylene glycol (MIRALAX / GLYCOLAX) packet Take 17 g by mouth daily as needed for mild constipation. 08/30/17   Barton Dubois, MD  QUEtiapine (SEROQUEL) 25 MG tablet Take 0.5 tablets (12.5 mg total) by mouth daily. 03/06/20   Nita Sells, MD    Current Facility-Administered Medications  Medication Dose Route Frequency Provider Last Rate Last Admin  . 0.45 % sodium chloride infusion   Intravenous Continuous Adefeso, Oladapo, DO 125 mL/hr at 03/19/20 0552 Rate Change at 03/19/20 0552  . 0.9 %  sodium chloride infusion  1,000 mL Intravenous Continuous Adefeso, Oladapo, DO   Held at 03/18/20 2332  . 0.9 %  sodium chloride infusion  10 mL/hr Intravenous Once Bernadette Hoit, DO   Held at 03/18/20 2326  . acetaminophen (TYLENOL) tablet 650 mg  650 mg Oral Q6H PRN Adefeso, Oladapo, DO      . ceFEPIme (MAXIPIME) 2 g in sodium chloride 0.9 % 100 mL IVPB  2 g Intravenous Q24H Erenest Blank, RPH   Stopped at 03/18/20 2312  . insulin aspart (novoLOG) injection 0-9 Units  0-9 Units Subcutaneous Q4H Hosie Poisson, MD   5 Units at 03/19/20 0839  . pantoprazole (PROTONIX) 80 mg in sodium chloride 0.9 % 100 mL (0.8 mg/mL) infusion  8 mg/hr Intravenous Continuous Adefeso, Oladapo, DO 10 mL/hr at 03/19/20 1004 8 mg/hr at 03/19/20 1004  . [START ON 03/22/2020] pantoprazole (PROTONIX) injection 40 mg  40 mg Intravenous Q12H Adefeso, Oladapo, DO      . [START ON 03/20/2020]  vancomycin (VANCOCIN) IVPB 1000 mg/200 mL premix  1,000 mg Intravenous Q48H Ledford, Joyice Faster, RPH       Current Outpatient Medications  Medication Sig Dispense Refill  . acetaminophen (TYLENOL) 650 MG CR tablet Take 650 mg by mouth every 8 (eight) hours as needed for pain.    Marland Kitchen amLODipine (NORVASC) 5 MG tablet Take 1 tablet (5 mg total) by mouth daily.    Roseanne Kaufman Peru-Castor Oil (VENELEX) OINT Apply liberal amount to coccyx, sacrum and bilateral buttocks q shift and prn after incontinence episodes for skin protection    . busPIRone (BUSPAR) 5 MG tablet Take 1 tablet (5 mg total) by mouth 2 (two) times daily. 6 tablet 0  . cloNIDine (CATAPRES - DOSED IN MG/24 HR) 0.1 mg/24hr patch Place 1 patch (0.1 mg total) onto the skin once a week. 4 patch 12  . insulin lispro (ADMELOG) 100 UNIT/ML injection Inject 2-12 Units into the skin See admin instructions. Inject as per sliding scale: if 201-250=2units; 251-300=4 units;301-350-=6 units;351-400=8 units;401-450=10 units; 451-500=12 units.    Marland Kitchen  iron polysaccharides (NIFEREX) 150 MG capsule Take 150 mg by mouth daily.    . melatonin 3 MG TABS tablet Take 1 tablet (3 mg total) by mouth at bedtime as needed (insomnia).  0  . metoprolol tartrate (LOPRESSOR) 25 MG tablet Take 1 tablet (25 mg total) by mouth 2 (two) times daily.    . pioglitazone (ACTOS) 15 MG tablet Take 1 tablet by mouth daily.  0  . senna (SENOKOT) 8.6 MG TABS tablet Take 1 tablet (8.6 mg total) by mouth 2 (two) times daily. 120 tablet 0  . Vitamin D, Cholecalciferol, 1000 units TABS Take 1,000 Units by mouth daily.     Marland Kitchen donepezil (ARICEPT) 5 MG tablet Take 5 mg by mouth at bedtime.    . food thickener (THICK IT) POWD Take 1 Container by mouth as needed (as needed to thicken liquids).  0  . polyethylene glycol (MIRALAX / GLYCOLAX) packet Take 17 g by mouth daily as needed for mild constipation.    . QUEtiapine (SEROQUEL) 25 MG tablet Take 0.5 tablets (12.5 mg total) by mouth daily. 6 tablet  0    Allergies as of 03/18/2020  . (No Known Allergies)    Past Medical History:  Diagnosis Date  . Dementia ([place])   . Dementia ([place])   . Diabetes mellitus without complication ([place])   . Dysphagia   . Encephalopathy   . Gastritis 2004  . GIB (gastrointestinal bleeding) 2004  . Hypertension   . Poor historian   . Renal disorder   . Sinus bradycardia seen on cardiac monitor     Past Surgical History:  Procedure Laterality Date  . INGUINAL HERNIA REPAIR  09/2010   Bilateral  . UPPER GASTROINTESTINAL ENDOSCOPY  2004    Family History  Problem Relation Age of Onset  . Diabetes Mother   . Diabetes Sister     Social History   Socioeconomic History  . Marital status: Widowed    Spouse name: Not on file  . Number of children: Not on file  . Years of education: Not on file  . Highest education level: Not on file  Occupational History  . Not on file  Tobacco Use  . Smoking status: Never Smoker  . Smokeless tobacco: Never Used  Vaping Use  . Vaping Use: Never used  Substance and Sexual Activity  . Alcohol use: No  . Drug use: No  . Sexual activity: Not on file  Other Topics Concern  . Not on file  Social History Narrative  . Not on file   Social Determinants of Health   Financial Resource Strain:   . Difficulty of Paying Living Expenses: Not on file  Food Insecurity:   . Worried About Charity fundraiser in the Last Year: Not on file  . Ran Out of Food in the Last Year: Not on file  Transportation Needs:   . Lack of Transportation (Medical): Not on file  . Lack of Transportation (Non-Medical): Not on file  Physical Activity:   . Days of Exercise per Week: Not on file  . Minutes of Exercise per Session: Not on file  Stress:   . Feeling of Stress : Not on file  Social Connections:   . Frequency of Communication with Friends and Family: Not on file  . Frequency of Social Gatherings with Friends and Family: Not on file  . Attends Religious Services: Not on  file  . Active Member of Clubs or Organizations: Not on file  .  Attends Archivist Meetings: Not on file  . Marital Status: Not on file  Intimate Partner Violence:   . Fear of Current or Ex-Partner: Not on file  . Emotionally Abused: Not on file  . Physically Abused: Not on file  . Sexually Abused: Not on file     ROS: unable to obtain      Physical Examination: Vital signs in last 24 hours: Temp:  [98.2 F (36.8 C)-101 F (38.3 C)] 98.4 F (36.9 C) (11/04 0103) Pulse Rate:  [56-101] 56 (11/04 1130) Resp:  [13-28] 17 (11/04 1130) BP: (78-177)/(49-154) 136/61 (11/04 1130) SpO2:  [99 %-100 %] 100 % (11/04 1130) Weight:  [83 kg] 83 kg (11/03 1834)    General: moaning/agitated. Unable to communicate with patient. Otherwise NAD Head: Normocephalic, atraumatic.   Mouth: Oropharyngeal mucosa moist and pink , no lesions erythema or exudate. Neck: Supple without thyromegaly, masses, or lymphadenopathy.  Lungs: difficult to examine due to moaning Heart: Regular rate and rhythm  Abdomen: Bowel sounds are normal,  nondistended, no hepatosplenomegaly or masses, no abdominal bruits or    hernia , no rebound or guarding.   Rectal: not performed Extremities: No lower extremity edema, clubbing, deformity.  Neuro: Alert confused Skin: Warm and dry, no rash or jaundice.   Psych: could not assess        Intake/Output from previous day: 11/03 0701 - 11/04 0700 In: 5579.9 [I.V.:2414.6; Blood:315; IV Piggyback:2850.2] Out: 200 [Urine:200] Intake/Output this shift: No intake/output data recorded.  Lab Results: CBC Recent Labs    03/18/20 1858 03/19/20 0419  WBC 10.3 11.1*  HGB 6.8* 6.7*  HCT 24.0* 23.5*  MCV 92.3 95.9  PLT 342 257   BMET Recent Labs    03/18/20 2113 03/19/20 0419 03/19/20 0909  NA 161* 163* 162*  K 4.2 4.7 4.6  CL 125* 129* 129*  CO2 22 22 19*  GLUCOSE 352* 221* 325*  BUN 83* 76* 79*  CREATININE 3.17* 2.70* 2.77*  CALCIUM 9.0 8.4* 8.3*    LFT Recent Labs    03/18/20 1858 03/19/20 0419  BILITOT 0.6 0.8  ALKPHOS 71 58  AST 46* 35  ALT 33 29  PROT 6.3* 5.5*  ALBUMIN 2.2* 2.0*    Lipase No results for input(s): LIPASE in the last 72 hours.  PT/INR Recent Labs    03/18/20 1858 03/19/20 0419  LABPROT 16.4* 17.1*  INR 1.4* 1.4*      Imaging Studies: DG Chest 1 View  Result Date: 03/18/2020 CLINICAL DATA:  Altered mental status, elevated glucose EXAM: CHEST  1 VIEW COMPARISON:  Radiograph 02/26/2020 FINDINGS: More coalescent patchy opacities seen in the right medial lung base/infrahilar lung. Additional atelectatic changes and vascular crowding are present as well. Vascularity remains fairly well-defined. No pneumothorax or effusion. The aorta is calcified. The remaining cardiomediastinal contours are unremarkable. No acute osseous or soft tissue abnormality. Degenerative changes are present in the imaged spine and shoulders. Telemetry leads overlie the chest. IMPRESSION: More coalescent patchy opacities in the right medial lung base/infrahilar lung could reflect pneumonia in the appropriate clinical setting. Additional atelectatic changes elsewhere. Aortic Atherosclerosis (ICD10-I70.0). Electronically Signed   By: Lovena Le M.D.   On: 03/18/2020 19:22   DG Abd 1 View  Result Date: 03/01/2020 CLINICAL DATA:  Abdominal distension EXAM: ABDOMEN - 1 VIEW COMPARISON:  01/04/2006 FINDINGS: Scattered large and small bowel gas is noted. Contrast material is noted in the colon from a prior modified barium swallow. No free air is  seen. No obstructive changes are noted. Degenerative changes of lumbar spine are seen. IMPRESSION: No acute abnormality noted. Electronically Signed   By: Inez Catalina M.D.   On: 03/01/2020 15:51   DG CHEST PORT 1 VIEW  Result Date: 02/26/2020 CLINICAL DATA:  Possible aspiration EXAM: PORTABLE CHEST 1 VIEW COMPARISON:  02/22/2020 FINDINGS: Cardiac shadow is stable. Aortic calcifications are again  seen. The lungs are well aerated bilaterally. No focal infiltrate or sizable effusion is seen. No bony abnormality is noted. IMPRESSION: No acute abnormality seen.  No change from the prior exam. Electronically Signed   By: Inez Catalina M.D.   On: 02/26/2020 17:41   DG Chest Port 1 View  Result Date: 02/22/2020 CLINICAL DATA:  Sepsis EXAM: PORTABLE CHEST 1 VIEW COMPARISON:  February 14, 2020 FINDINGS: The heart size and mediastinal contours are within normal limits. Both lungs are clear. The visualized skeletal structures are unremarkable. IMPRESSION: No active disease. Electronically Signed   By: Abelardo Diesel M.D.   On: 02/22/2020 14:08   DG Swallowing Func-Speech Pathology  Result Date: 02/26/2020 Objective Swallowing Evaluation: Type of Study: MBS-Modified Barium Swallow Study  Patient Details Name: JAKEEL STARLIPER MRN: [id number] Date of Birth: February 13, 1928 Today's Date: 02/26/2020 Time: SLP Start Time (ACUTE ONLY): 1339 -SLP Stop Time (ACUTE ONLY): 1408 SLP Time Calculation (min) (ACUTE ONLY): 29 min Past Medical History: Past Medical History: Diagnosis Date . Dementia ([place])  . Diabetes mellitus without complication ([place])  . Encephalopathy  . Gastritis 2004 . GIB (gastrointestinal bleeding) 2004 . Hypertension  . Poor historian  . Sinus bradycardia seen on cardiac monitor  Past Surgical History: Past Surgical History: Procedure Laterality Date . INGUINAL HERNIA REPAIR  09/2010  Bilateral . UPPER GASTROINTESTINAL ENDOSCOPY  2004 HPI: This 86 y.o.malewith medical history significant ofDementia,DiabetesMellitus II, Hypertension, CKD stage IV, premature atrial contractions,history of GI bleed, presents in the emergency department with confusion and fever. This is patient's third ER visit In last three weeks.Last week he presented in the ED with generalized weakness and pain,found to have a UTI . He was discharged on Keflex. Daughter reportshe has been having worsening pain and weakness, was not  able to stand up in the morning. Patient is admitted for sepsis secondary to Pseudomonas bacteremia.  He was also found to have high anion gap metabolic acidosis secondary to DKA which has been resolved. He is transitioned to subcu insulin. BSE requested.  Subjective: "I want water!" Assessment / Plan / Recommendation CHL IP CLINICAL IMPRESSIONS 02/26/2020 Clinical Impression Pt presents with mild oropharyngeal sensorimotor dysphagia characterized by decreased laryngeal vestibule closure resulting in two episodes of trace silent aspiration of thin liquids via straw and consistent penetration of thin liquids and NTL during the swallow that is usually expelled with repeat swallow of solid textures. Decreased pharyngeal constriction resulting in inconsistent clearance of penetrates however despite remaining in the airway, they were only visualized dropping to and below the cords twice as mentioned above. Solid textures were consumed without incident and the barium tablet was swallowed with thin liquids with noted brief stasis in the pyriforms but quickly passed through. RN was present for MBS and BP and HR were stable throughout procedure. Recommend D3/mech soft and Nectar thick liquids; recommend meds whole or crushed in puree as Pt is able. Also recommend free water protocol, * single ice chips after oral care in between meals *. ST will continue to follow acutely. SLP Visit Diagnosis Dysphagia, unspecified (R13.10) Attention and concentration deficit following -- Frontal  lobe and executive function deficit following -- Impact on safety and function Mild aspiration risk;Moderate aspiration risk   CHL IP TREATMENT RECOMMENDATION 02/26/2020 Treatment Recommendations Therapy as outlined in treatment plan below   Prognosis 02/26/2020 Prognosis for Safe Diet Advancement Fair Barriers to Reach Goals Cognitive deficits Barriers/Prognosis Comment -- CHL IP DIET RECOMMENDATION 02/26/2020 SLP Diet Recommendations Dysphagia 3  (Mech soft) solids;Nectar thick liquid Liquid Administration via Cup;No straw Medication Administration Whole meds with puree Compensations Slow rate;Small sips/bites Postural Changes Remain semi-upright after after feeds/meals (Comment);Seated upright at 90 degrees   CHL IP OTHER RECOMMENDATIONS 02/26/2020 Recommended Consults -- Oral Care Recommendations Oral care BID Other Recommendations Order thickener from pharmacy   CHL IP FOLLOW UP RECOMMENDATIONS 02/26/2020 Follow up Recommendations 24 hour supervision/assistance   CHL IP FREQUENCY AND DURATION 02/26/2020 Speech Therapy Frequency (ACUTE ONLY) min 2x/week Treatment Duration 1 week      CHL IP ORAL PHASE 02/26/2020 Oral Phase WFL Oral - Pudding Teaspoon -- Oral - Pudding Cup -- Oral - Honey Teaspoon -- Oral - Honey Cup -- Oral - Nectar Teaspoon -- Oral - Nectar Cup -- Oral - Nectar Straw -- Oral - Thin Teaspoon -- Oral - Thin Cup -- Oral - Thin Straw -- Oral - Puree -- Oral - Mech Soft -- Oral - Regular -- Oral - Multi-Consistency -- Oral - Pill -- Oral Phase - Comment --  CHL IP PHARYNGEAL PHASE 02/26/2020 Pharyngeal Phase Impaired Pharyngeal- Pudding Teaspoon -- Pharyngeal -- Pharyngeal- Pudding Cup -- Pharyngeal -- Pharyngeal- Honey Teaspoon Pharyngeal residue - valleculae;Pharyngeal residue - pyriform;Reduced pharyngeal peristalsis Pharyngeal -- Pharyngeal- Honey Cup -- Pharyngeal -- Pharyngeal- Nectar Teaspoon Reduced pharyngeal peristalsis;Reduced airway/laryngeal closure;Penetration/Aspiration during swallow;Pharyngeal residue - valleculae;Pharyngeal residue - pyriform Pharyngeal Material enters airway, remains ABOVE vocal cords then ejected out Pharyngeal- Nectar Cup Reduced pharyngeal peristalsis;Reduced airway/laryngeal closure;Penetration/Aspiration during swallow;Pharyngeal residue - valleculae;Pharyngeal residue - pyriform Pharyngeal Material enters airway, remains ABOVE vocal cords then ejected out Pharyngeal- Nectar Straw -- Pharyngeal --  Pharyngeal- Thin Teaspoon Delayed swallow initiation-pyriform sinuses;Reduced pharyngeal peristalsis;Reduced epiglottic inversion;Reduced anterior laryngeal mobility;Reduced laryngeal elevation;Reduced airway/laryngeal closure;Reduced tongue base retraction;Penetration/Aspiration during swallow;Pharyngeal residue - valleculae;Pharyngeal residue - pyriform Pharyngeal Material enters airway, remains ABOVE vocal cords and not ejected out Pharyngeal- Thin Cup Delayed swallow initiation-pyriform sinuses;Reduced pharyngeal peristalsis;Reduced epiglottic inversion;Reduced anterior laryngeal mobility;Reduced laryngeal elevation;Reduced airway/laryngeal closure;Reduced tongue base retraction;Penetration/Aspiration during swallow;Pharyngeal residue - valleculae;Pharyngeal residue - pyriform Pharyngeal Material enters airway, remains ABOVE vocal cords and not ejected out Pharyngeal- Thin Straw Delayed swallow initiation-pyriform sinuses;Reduced pharyngeal peristalsis;Reduced epiglottic inversion;Reduced anterior laryngeal mobility;Reduced laryngeal elevation;Reduced airway/laryngeal closure;Reduced tongue base retraction;Penetration/Aspiration during swallow;Pharyngeal residue - valleculae;Pharyngeal residue - pyriform Pharyngeal Material enters airway, passes BELOW cords without attempt by patient to eject out (silent aspiration) Pharyngeal- Puree WFL Pharyngeal -- Pharyngeal- Mechanical Soft -- Pharyngeal -- Pharyngeal- Regular WFL Pharyngeal -- Pharyngeal- Multi-consistency -- Pharyngeal -- Pharyngeal- Pill Pharyngeal residue - cp segment;Pharyngeal residue - pyriform Pharyngeal -- Pharyngeal Comment --  CHL IP CERVICAL ESOPHAGEAL PHASE 02/26/2020 Cervical Esophageal Phase WFL Pudding Teaspoon -- Pudding Cup -- Honey Teaspoon -- Honey Cup -- Nectar Teaspoon -- Nectar Cup -- Nectar Straw -- Thin Teaspoon -- Thin Cup -- Thin Straw -- Puree -- Mechanical Soft -- Regular -- Multi-consistency -- Pill -- Cervical Esophageal  Comment -- Amelia H. Roddie Mc, CCC-SLP Speech Language Pathologist Wende Bushy 02/26/2020, 4:49 PM              US Abdomen Limited RUQ  Result Date: 02/23/2020 CLINICAL DATA:  Elevated  liver enzymes EXAM: ULTRASOUND ABDOMEN LIMITED RIGHT UPPER QUADRANT COMPARISON:  August 25, 2017 FINDINGS: Gallbladder: No wall thickening visualized. Gallstones are identified. 0.3 cm polyp is identified. No sonographic Murphy sign noted by sonographer. Common bile duct: Diameter: 0.3 mm Liver: 1.5 x 1.1 x 1.4 cm cyst is identified in the left lobe liver. Within normal limits in parenchymal echogenicity. Portal vein is patent on color Doppler imaging with normal direction of blood flow towards the liver. Other: None. IMPRESSION: 1. Cholelithiasis without sonographic evidence of acute cholecystitis. 2. 1.5 cm cyst in the left lobe liver. Electronically Signed   By: Abelardo Diesel M.D.   On: 02/23/2020 14:49  [4 week]   Impression: 86 year old male presenting from skilled nursing facility due to altered mental status, fever, hyperglycemia. Remote history of upper GI bleed as outlined above, back in 2004. Patient admitted with severe sepsis secondary to multifactorial including presumed community-acquired pneumonia, possible bacteremia/UTI, hypernatremia, acute on chronic kidney disease, profound anemia. GI consulted for GI bleeding.  Anemia: Decline from baseline. At time of discharge 2 weeks ago his hemoglobin was 7.7. Presented yesterday with a hemoglobin of 6.8. Hemoglobin 6.7 after 1 unit of packed red blood cells. BUN elevated in the 80 range. INR 1.4. No known anticoagulants. No reported aspirin or NSAIDs. Heme positive stool noted on rectal exam. No reported overt GI bleeding. Currently patient is not a candidate for endoscopy.  Plan: 1. Transfuse as needed. 2. Monitor for overt GI bleeding. 3. Consider endoscopy once patient is medically stable. 4. Agree with IV PPI twice daily.  We would like to  thank you for the opportunity to participate in the care of DARYLL SPISAK.  Laureen Ochs. Bernarda Caffey [facility] [phone] 11/4/202112:37 PM     LOS: 1 day

## 2020-03-19 NOTE — Consult Note (Signed)
Consultation Note Date: 03/19/2020   Patient Name: Paul Perez  DOB: 1927-09-19  MRN: [id number]  Age / Sex: 86 y.o., male  PCP: [facility] Referring Physician: Hosie Poisson, MD  Reason for Consultation: Establishing goals of care  HPI/Patient Profile: 86 y.o. male  with past medical history of dementia, DM type 2, HTN, stage IV CKD, GI bleeding, recent hospital admission for severe sepsis secondary to pseudomonas bactermia treated with IV cefepime with unremarkable repeat blood cultures admitted on 03/18/2020 with elevated blood sugars and confusion. Hospital admission for severe sepsis from UTI/pneumonia and acute metabolic encephalopathy with baseline dementia. Palliative medicine consultation for goals of care.    Clinical Assessment and Goals of Care: PMT consult received and chart reviewed in detail. Discussed with Dr. Karleen Hampshire.   Patient known to this NP from previous admission. [place] discussion with patient's son/POA Vicente Males) and daughter [name]). MOST form was completed with Vicente Males after he conversed with patient's other three children. Decisions included DNR/DNI, limited additional interventions including rehospitalization if necessary, BiPAP/CPAP if indicated, IVF/ABX if indicated, and NO feeding tube. Durable DNR was completed. Patient was discharged to SNF rehab and family was hopeful for some improvement and ability for him to rehab.   Patient holding in ED when this NP visited. He is lethargic and non-verbal. Unable to participate in conversation. No family at bedside.   Spoke with son, Vicente Males via telephone to discuss goals of care. Derek familiar with PMT and remembers our conversation from previous admission.   Patient was doing fair at SNF. Son reports appetite was improving. Functionally, they have only gotten him from bed to wheelchair. Cognitively, he has remained confused with  underlying dementia. Does not recognize family.   Discussed events leading up to admission and course of hospitalization including diagnoses, interventions, plan of care.  Vicente Males confirms decision for DNR/DNI code status and NO feeding tube.   Discussed ongoing medical management versus comfort focused pathway with consideration of hospice services. Discussed progressive decline secondary to complications from dementia. Discussed hospice options and philosophy.   Derek requesting to further discuss with his three siblings prior to making decisions. Encouraged Vicente Males to call PMT provider back this afternoon if questions/concerns or ready to make decision.   Dr. Karleen Hampshire spoke with daughter, [name] about same recommendations for hospice/comfort. Family considering hospice options.     SUMMARY OF RECOMMENDATIONS    GOC discussed with son/POA Wynn Maudlin).   MOST form completed previous admission. Reviewed. Son confirms decision for DNR/DNI code status and NO feeding tube.   Continue current plan of care and medical management. Watchful waiting.   Introduced and discussed hospice options and philosophy. Son considering and requests to further speak with his three siblings prior to making decision on comfort/hospice.   PMT provider off service at [facility] until Monday 11/8. Attending will have to f/u with family regarding hospice decision this weekend.   Code Status/Advance Care Planning:  DNR/DNI  Symptom Management:   Per attending  Palliative Prophylaxis:   Aspiration, Bowel  Regimen, Delirium Protocol, Frequent Pain Assessment, Oral Care and Turn Reposition  Psycho-social/Spiritual:   Desire for further Chaplaincy support: yes  Additional Recommendations: Caregiving  Support/Resources, Compassionate Wean Education and Education on Hospice  Prognosis:   Poor prognosis  Discharge Planning: To Be Determined: son considering hospice options      Primary Diagnoses: Present on  Admission: . Acute febrile illness . Diabetes mellitus type 2, uncontrolled, with complications ([place]) . Hypertension . Hypernatremia . CKD (chronic kidney disease) stage 4, GFR 15-29 ml/min (HCC) . Acute metabolic encephalopathy   I have reviewed the medical record, interviewed the patient and family, and examined the patient. The following aspects are pertinent.  Past Medical History:  Diagnosis Date  . Dementia ([place])   . Dementia ([place])   . Diabetes mellitus without complication ([place])   . Dysphagia   . Encephalopathy   . Gastritis 2004  . GIB (gastrointestinal bleeding) 2004  . Hypertension   . Poor historian   . Renal disorder   . Sinus bradycardia seen on cardiac monitor    Social History   Socioeconomic History  . Marital status: Widowed    Spouse name: Not on file  . Number of children: Not on file  . Years of education: Not on file  . Highest education level: Not on file  Occupational History  . Not on file  Tobacco Use  . Smoking status: Never Smoker  . Smokeless tobacco: Never Used  Vaping Use  . Vaping Use: Never used  Substance and Sexual Activity  . Alcohol use: No  . Drug use: No  . Sexual activity: Not on file  Other Topics Concern  . Not on file  Social History Narrative  . Not on file   Social Determinants of Health   Financial Resource Strain:   . Difficulty of Paying Living Expenses: Not on file  Food Insecurity:   . Worried About Charity fundraiser in the Last Year: Not on file  . Ran Out of Food in the Last Year: Not on file  Transportation Needs:   . Lack of Transportation (Medical): Not on file  . Lack of Transportation (Non-Medical): Not on file  Physical Activity:   . Days of Exercise per Week: Not on file  . Minutes of Exercise per Session: Not on file  Stress:   . Feeling of Stress : Not on file  Social Connections:   . Frequency of Communication with Friends and Family: Not on file  . Frequency of Social Gatherings with  Friends and Family: Not on file  . Attends Religious Services: Not on file  . Active Member of Clubs or Organizations: Not on file  . Attends Archivist Meetings: Not on file  . Marital Status: Not on file   Family History  Problem Relation Age of Onset  . Diabetes Mother   . Diabetes Sister    Scheduled Meds: . sodium chloride   Intravenous Once  . insulin aspart  0-9 Units Subcutaneous Q4H  . [START ON 03/22/2020] pantoprazole  40 mg Intravenous Q12H   Continuous Infusions: . sodium chloride Stopped (03/18/20 2326)  . ceFEPime (MAXIPIME) IV Stopped (03/18/20 2312)  . dextrose 5 % and 0.45% NaCl 100 mL/hr at 03/19/20 1703  . pantoprozole (PROTONIX) infusion 8 mg/hr (03/19/20 1004)  . [START ON 03/20/2020] vancomycin     PRN Meds:.acetaminophen Medications Prior to Admission:  Prior to Admission medications   Medication Sig Start Date End Date Taking? Authorizing Provider  acetaminophen (TYLENOL) 650 MG CR tablet Take 650 mg by mouth every 8 (eight) hours as needed for pain.   Yes [provider]  amLODipine (NORVASC) 5 MG tablet Take 1 tablet (5 mg total) by mouth daily. 03/06/20  Yes Nita Sells, MD  Roseanne Kaufman [place]-Castor Oil [facility]) OINT Apply liberal amount to coccyx, sacrum and bilateral buttocks q shift and prn after incontinence episodes for skin protection   Yes [provider]  busPIRone (BUSPAR) 5 MG tablet Take 1 tablet (5 mg total) by mouth 2 (two) times daily. 03/06/20  Yes Nita Sells, MD  cloNIDine (CATAPRES - DOSED IN MG/24 HR) 0.1 mg/24hr patch Place 1 patch (0.1 mg total) onto the skin once a week. 03/08/20  Yes Nita Sells, MD  insulin lispro (ADMELOG) 100 UNIT/ML injection Inject 2-12 Units into the skin See admin instructions. Inject as per sliding scale: if 201-250=2units; 251-300=4 units;301-350-=6 units;351-400=8 units;401-450=10 units; 451-500=12 units.   Yes [provider]  iron polysaccharides  (NIFEREX) 150 MG capsule Take 150 mg by mouth daily.   Yes [provider]  melatonin 3 MG TABS tablet Take 1 tablet (3 mg total) by mouth at bedtime as needed (insomnia). 03/06/20  Yes Nita Sells, MD  metoprolol tartrate (LOPRESSOR) 25 MG tablet Take 1 tablet (25 mg total) by mouth 2 (two) times daily. 03/06/20  Yes Nita Sells, MD  pioglitazone (ACTOS) 15 MG tablet Take 1 tablet by mouth daily. 12/01/16  Yes [provider]  senna (SENOKOT) 8.6 MG TABS tablet Take 1 tablet (8.6 mg total) by mouth 2 (two) times daily. 03/06/20  Yes Nita Sells, MD  Vitamin D, Cholecalciferol, 1000 units TABS Take 1,000 Units by mouth daily.    Yes [provider]  donepezil (ARICEPT) 5 MG tablet Take 5 mg by mouth at bedtime.    [provider]  food thickener (THICK IT) POWD Take 1 Container by mouth as needed (as needed to thicken liquids). 03/06/20   Nita Sells, MD  polyethylene glycol (MIRALAX / GLYCOLAX) packet Take 17 g by mouth daily as needed for mild constipation. 08/30/17   Barton Dubois, MD  QUEtiapine (SEROQUEL) 25 MG tablet Take 0.5 tablets (12.5 mg total) by mouth daily. 03/06/20   Nita Sells, MD   No Known Allergies Review of Systems  Unable to perform ROS: Dementia   Physical Exam Vitals and nursing note reviewed.  Cardiovascular:     Rate and Rhythm: Normal rate.  Pulmonary:     Effort: No tachypnea, accessory muscle usage or respiratory distress.  Skin:    General: Skin is warm and dry.  Neurological:     Mental Status: He is lethargic.     Comments: Disoriented with baseline dementia  Psychiatric:        Attention and Perception: He is inattentive.        Speech: He is noncommunicative.        Cognition and Memory: Cognition is impaired.    Vital Signs: BP (!) 153/62   Pulse (!) 53   Temp 98.4 F (36.9 C) (Axillary)   Resp 14   Ht 6' (1.829 m)   Wt 83 kg   SpO2 100%   BMI 24.82 kg/m  Pain  Scale: Faces      SpO2: SpO2: 100 % O2 Device:SpO2: 100 % O2 Flow Rate: .   IO: Intake/output summary:   Intake/Output Summary (Last 24 hours) at 03/19/2020 1726 Last data filed at 03/19/2020 1413 Gross per 24 hour  Intake  5800.15 ml  Output 200 ml  Net 5600.15 ml    LBM:   Baseline Weight: Weight: 83 kg Most recent weight: Weight: 83 kg     Palliative Assessment/Data: PPS 20%   Flowsheet Rows     Most Recent Value  Intake Tab  Referral Department Hospitalist  Unit at Time of Referral ER  Palliative Care Primary Diagnosis Sepsis/Infectious Disease  Palliative Care Type Return patient Palliative Care  Reason for referral Clarify Goals of Care, End of [place]  Date first seen by Palliative Care 03/19/20  Clinical Assessment  Palliative Performance Scale Score 20%  Psychosocial & Spiritual Assessment  Palliative Care Outcomes  Patient/Family meeting held? Yes  Who was at the meeting? son Vicente Males)  [place] goals of care, Counseled regarding hospice, Provided end of life care assistance, Provided psychosocial or spiritual support, ACP counseling assistance       Time Total: 60 Greater than 50%  of this time was spent counseling and coordinating care related to the above assessment and plan.  Signed by:  Ihor Dow, DNP, FNP-C Palliative Medicine Team  Phone: [phone] Fax: [phone]   Please contact Palliative Medicine Team phone at [phone] for questions and concerns.  For individual provider: See Shea Evans

## 2020-03-20 DIAGNOSIS — D649 Anemia, unspecified: Secondary | ICD-10-CM

## 2020-03-20 DIAGNOSIS — G9341 Metabolic encephalopathy: Secondary | ICD-10-CM

## 2020-03-20 DIAGNOSIS — K922 Gastrointestinal hemorrhage, unspecified: Secondary | ICD-10-CM

## 2020-03-20 DIAGNOSIS — N184 Chronic kidney disease, stage 4 (severe): Secondary | ICD-10-CM

## 2020-03-20 DIAGNOSIS — E1165 Type 2 diabetes mellitus with hyperglycemia: Secondary | ICD-10-CM

## 2020-03-20 DIAGNOSIS — A419 Sepsis, unspecified organism: Secondary | ICD-10-CM

## 2020-03-20 DIAGNOSIS — R6520 Severe sepsis without septic shock: Secondary | ICD-10-CM

## 2020-03-20 DIAGNOSIS — E118 Type 2 diabetes mellitus with unspecified complications: Secondary | ICD-10-CM

## 2020-03-20 LAB — BASIC METABOLIC PANEL
Anion gap: 11 (ref 5–15)
Anion gap: 9 (ref 5–15)
BUN: 66 mg/dL — ABNORMAL HIGH (ref 8–23)
BUN: 58 mg/dL — ABNORMAL HIGH (ref 8–23)
CO2: 21 mmol/L — ABNORMAL LOW (ref 22–32)
CO2: 21 mmol/L — ABNORMAL LOW (ref 22–32)
Calcium: 8.2 mg/dL — ABNORMAL LOW (ref 8.9–10.3)
Calcium: 8 mg/dL — ABNORMAL LOW (ref 8.9–10.3)
Chloride: 130 mmol/L — ABNORMAL HIGH (ref 98–111)
Chloride: 130 mmol/L — ABNORMAL HIGH (ref 98–111)
Creatinine, Ser: 2.45 mg/dL — ABNORMAL HIGH (ref 0.61–1.24)
Creatinine, Ser: 2.44 mg/dL — ABNORMAL HIGH (ref 0.61–1.24)
GFR, Estimated: 24 mL/min — ABNORMAL LOW (ref >60)
GFR, Estimated: 25 mL/min — ABNORMAL LOW (ref >60)
Glucose, Bld: 200 mg/dL — ABNORMAL HIGH (ref 70–99)
Glucose, Bld: 320 mg/dL — ABNORMAL HIGH (ref 70–99)
Potassium: 3.3 mmol/L — ABNORMAL LOW (ref 3.5–5.1)
Potassium: 3.5 mmol/L (ref 3.5–5.1)
Sodium: 162 mmol/L (ref 135–145)
Sodium: 160 mmol/L — ABNORMAL HIGH (ref 135–145)

## 2020-03-20 LAB — GLUCOSE, CAPILLARY
Glucose-Capillary: 133 mg/dL — ABNORMAL HIGH (ref 70–99)
Glucose-Capillary: 179 mg/dL — ABNORMAL HIGH (ref 70–99)
Glucose-Capillary: 198 mg/dL — ABNORMAL HIGH (ref 70–99)
Glucose-Capillary: 216 mg/dL — ABNORMAL HIGH (ref 70–99)
Glucose-Capillary: 244 mg/dL — ABNORMAL HIGH (ref 70–99)
Glucose-Capillary: 250 mg/dL — ABNORMAL HIGH (ref 70–99)
Glucose-Capillary: 303 mg/dL — ABNORMAL HIGH (ref 70–99)

## 2020-03-20 LAB — BLOOD CULTURE ID PANEL (REFLEXED) - BCID2

## 2020-03-20 LAB — CBC
HCT: 30.6 % — ABNORMAL LOW (ref 39.0–52.0)
Hemoglobin: 9.2 g/dL — ABNORMAL LOW (ref 13.0–17.0)
MCH: 28 pg (ref 26.0–34.0)
MCHC: 30.1 g/dL (ref 30.0–36.0)
MCV: 93.3 fL (ref 80.0–100.0)
Platelets: 218 10*3/uL (ref 150–400)
RBC: 3.28 MIL/uL — ABNORMAL LOW (ref 4.22–5.81)
RDW: 15.2 % (ref 11.5–15.5)
WBC: 8 10*3/uL (ref 4.0–10.5)
nRBC: 0.7 % — ABNORMAL HIGH (ref 0.0–0.2)

## 2020-03-20 LAB — URINE CULTURE

## 2020-03-20 LAB — HEMOGLOBIN AND HEMATOCRIT, BLOOD
HCT: 30.9 % — ABNORMAL LOW (ref 39.0–52.0)
HCT: 31.2 % — ABNORMAL LOW (ref 39.0–52.0)
Hemoglobin: 9 g/dL — ABNORMAL LOW (ref 13.0–17.0)
Hemoglobin: 9.4 g/dL — ABNORMAL LOW (ref 13.0–17.0)

## 2020-03-20 LAB — PROCALCITONIN: Procalcitonin: 1.31 ng/mL

## 2020-03-20 MED ORDER — POLYVINYL ALCOHOL 1.4 % OP SOLN
1.0000 [drp] | OPHTHALMIC | Status: DC | PRN
  Administered 2020-03-20 – 2020-03-25 (×3): 1 [drp] via OPHTHALMIC
  Filled 2020-03-20: qty 15

## 2020-03-20 MED ORDER — HYDRALAZINE HCL 20 MG/ML IJ SOLN: 5.0000 mg | Freq: Three times a day (TID) | INTRAMUSCULAR | Status: DC | PRN

## 2020-03-20 MED ORDER — DEXTROSE 5 % IV SOLN: INTRAVENOUS | Status: DC

## 2020-03-20 MED ORDER — METRONIDAZOLE IN NACL 5-0.79 MG/ML-% IV SOLN
500.0000 mg | Freq: Three times a day (TID) | INTRAVENOUS | Status: DC
  Administered 2020-03-20 – 2020-03-21 (×4): 500 mg via INTRAVENOUS
  Filled 2020-03-20 (×4): qty 100

## 2020-03-20 MED ORDER — MORPHINE SULFATE (PF) 2 MG/ML IV SOLN
1.0000 mg | INTRAVENOUS | Status: DC | PRN
  Administered 2020-03-20 – 2020-03-22 (×7): 2 mg via INTRAVENOUS
  Filled 2020-03-20 (×8): qty 1

## 2020-03-20 MED ORDER — HALOPERIDOL LACTATE 5 MG/ML IJ SOLN
5.0000 mg | Freq: Four times a day (QID) | INTRAMUSCULAR | Status: DC | PRN
  Administered 2020-03-20 – 2020-03-22 (×4): 5 mg via INTRAVENOUS
  Filled 2020-03-20 (×4): qty 1

## 2020-03-20 MED ORDER — PANTOPRAZOLE SODIUM 40 MG IV SOLR
INTRAVENOUS | Status: AC
  Filled 2020-03-20: qty 80

## 2020-03-20 NOTE — Progress Notes (Signed)
Reviewed patient chart. Pseudomonas sepsis with AMS and remote history of UGI bleed in 2004. Noted profound anemia and heme+ stool with no overt GI bleed. Hgb declined from 7.7 to 6.7. Given 2 units PRBC. Hgb improved to 9.2 today.  Per nursing no noted GI bleed. Patient still quite agitated.  Overall patient is too sick for endoscopic evaluation, will need to continue conservative measures for now. May be a cnadidate as he improves.  Recommendations: 1. Continue to monitor for overt GI bleed 2. Monitor hgb 3. Transfuse as necessary 4. Continue PPI 5. We will follow peripherally for possible EGD prior to d/c when more stable

## 2020-03-20 NOTE — Progress Notes (Signed)
PHARMACY - PHYSICIAN COMMUNICATION CRITICAL VALUE ALERT - BLOOD CULTURE IDENTIFICATION (BCID)  Paul Perez is an 86 y.o. male who presented to [facility] on 03/18/2020 with a chief complaint of  sepsis due to pneumonia and UTI.  Assessment:  GNR in aerobic bottle only  Name of physician (or Provider) Contacted: Dr. Karleen Hampshire  Current antibiotics: cefepime 2g IV q24h  Changes to prescribed antibiotics recommended:  Patient is on recommended antibiotics - No changes needed  Results for orders placed or performed during the hospital encounter of 03/18/20  Blood Culture ID Panel (Reflexed) (Collected: 03/18/2020  7:39 PM)  Result Value Ref Range   Enterococcus faecalis NOT DETECTED NOT DETECTED   Enterococcus Faecium NOT DETECTED NOT DETECTED   Listeria monocytogenes NOT DETECTED NOT DETECTED   Staphylococcus species NOT DETECTED NOT DETECTED   Staphylococcus aureus (BCID) NOT DETECTED NOT DETECTED   Staphylococcus epidermidis NOT DETECTED NOT DETECTED   Staphylococcus lugdunensis NOT DETECTED NOT DETECTED   Streptococcus species NOT DETECTED NOT DETECTED   Streptococcus agalactiae NOT DETECTED NOT DETECTED   Streptococcus pneumoniae NOT DETECTED NOT DETECTED   Streptococcus pyogenes NOT DETECTED NOT DETECTED   A.calcoaceticus-baumannii NOT DETECTED NOT DETECTED   Bacteroides fragilis NOT DETECTED NOT DETECTED   Enterobacterales NOT DETECTED NOT DETECTED   Enterobacter cloacae complex NOT DETECTED NOT DETECTED   Escherichia coli NOT DETECTED NOT DETECTED   Klebsiella aerogenes NOT DETECTED NOT DETECTED   Klebsiella oxytoca NOT DETECTED NOT DETECTED   Klebsiella pneumoniae NOT DETECTED NOT DETECTED   Proteus species NOT DETECTED NOT DETECTED   Salmonella species NOT DETECTED NOT DETECTED   Serratia marcescens NOT DETECTED NOT DETECTED   Haemophilus influenzae NOT DETECTED NOT DETECTED   Neisseria meningitidis NOT DETECTED NOT DETECTED   Pseudomonas aeruginosa DETECTED (A) NOT  DETECTED   Stenotrophomonas maltophilia NOT DETECTED NOT DETECTED   Candida albicans NOT DETECTED NOT DETECTED   Candida auris NOT DETECTED NOT DETECTED   Candida glabrata NOT DETECTED NOT DETECTED   Candida krusei NOT DETECTED NOT DETECTED   Candida parapsilosis NOT DETECTED NOT DETECTED   Candida tropicalis NOT DETECTED NOT DETECTED   Cryptococcus neoformans/gattii NOT DETECTED NOT DETECTED   CTX-M ESBL NOT DETECTED NOT DETECTED   Carbapenem resistance IMP NOT DETECTED NOT DETECTED   Carbapenem resistance KPC NOT DETECTED NOT DETECTED   Carbapenem resistance NDM NOT DETECTED NOT DETECTED   Carbapenem resistance VIM NOT DETECTED NOT DETECTED    Despina Pole 03/20/2020  10:50 AM

## 2020-03-20 NOTE — Progress Notes (Signed)
SLP Cancellation Note  Patient Details Name: Paul Perez MRN: [id number] DOB: July 23, 1927   Cancelled treatment:       Reason Eval/Treat Not Completed: Patient's level of consciousness;Pain limiting ability to participate. Pt was agitated, rolling around in bed, and calling out as though he was in pain. Pt did respond to verbal stimulus but is not appropriate for BSE at this time, ST will continue efforts and will follow for goals of care. Thank you,  Jennilyn Esteve H. Roddie Mc, CCC-SLP Speech Language Pathologist     Wende Bushy 03/20/2020, 1:33 PM

## 2020-03-20 NOTE — Progress Notes (Signed)
CRITICAL VALUE ALERT  Critical Value:  Sodium 162  Date & Time Notied:  03/20/2020 @ 10:29  Provider Notified: Dr. Karleen Hampshire via Amion/in person  Orders Received/Actions taken: Changing IV fluids and continuing to monitor.

## 2020-03-20 NOTE — Progress Notes (Signed)
PROGRESS NOTE    Paul Perez  JJO:[id number] DOB: 03/06/1928 DOA: 03/18/2020 PCP: [facility]    Chief Complaint  Patient presents with  . Hyperglycemia    Brief Narrative:  86 year old male prior history of type 2 diabetes, hypertension, stage IV CKD, dementia, GI bleed presents to ED from [facility] for confusion and fever and elevated blood sugars.  Patient unable to provide any history due to underlying dementia.  Patient was found to have right sided pneumonia/ infrahilar pneumonia and urinary tract infection and admitted for severe sepsis. He was also found to be anemic and stool for occult blood is positive. GI consulted , recommended transfusions as needed and conservative management.  In view of his severe sepsis, advanced dementia, clinical deterioration, dysphagia, poor oral intake, hyper natremia not responding to IV treatments, AKI, palliative care consulted for goals of care discussion. Pt seen and examined at bedside, he continues to be agitated, flailing his arms on tactile stimulation. Marland Kitchen He was started on broad spectrum IV antibiotics and his blood cultures were positive for pseudomonas .  Discussed the pla with the patient's son over the phone.    Assessment & Plan:   Principal Problem:   Severe sepsis (Grandfather) Active Problems:   Hypertension   Hypernatremia   Diabetes mellitus type 2, uncontrolled, with complications (HCC)   Dehydration   CKD (chronic kidney disease) stage 4, GFR 15-29 ml/min (HCC)   Acute febrile illness   Hyperosmolar hyperglycemic state (HHS) (HCC)   GI bleed   Sacral decubitus ulcer, stage II ([place])   Abrasion of left leg   Scrotal skin lesion   Hypoalbuminemia   Acute metabolic encephalopathy   Community acquired pneumonia     Acute metabolic encephalopathy: Superimposed on baseline dementia , probably secondary to severe sepsis from UTI, Pneumonia and Pseudomonas bacteremia Pt still agitated and confused.    Currently NPO and SLP eval ordered, couldn't be evaluated due to his agitation.  Pt on admission, was febrile, tachypneic RR >25/min, with AKI, lactic acid of 6.6, was found to have pneumonia and UTI, Pseudomonas bacteremia. He was started on broad-spectrum IV antibiotics transition to IV cefepime and Flagyl Continue the same, watch for sensitivities.  Follow blood cultures and urine cultures.   Pseudomonas bacteremia Patient is on IV cefepime.  Await for sensitivities.   Essential hypertension Suboptimally controlled as needed hydralazine will be ordered.  Hyperglycemia sec to poorly controlled type 2 DM:  He was started on IV insulin and transitioned to SSI.  CBG (last 3)  Recent Labs    03/20/20 0349 03/20/20 0733 03/20/20 1121  GLUCAP 216* 133* 179*   Continue with sliding scale insulin.  Acute anemia of blood loss possibly GI source.  Hemoccult is positive.  He underwent 2 units of PRBC transfusion his repeat hemoglobin is around 9. GI consulted and recommendations given.  Continue with IV PPI BID.    Acute kidney injury on Stage IV CKD,  Monitor renal parameters while on IV fluids.  Creatinine has improved from 3.7-2.4 Avoid nephrotoxic agents/ dehydration/ hypotension.  Continue with IV fluid resuscitation.    Stage 2 sacral decubitus ulcer:  Wound care consulted.  Pressure Injury 03/19/20 Sacrum (Active)  03/19/20 1600  Location: Sacrum  Location Orientation:   Staging:   Wound Description (Comments):   Present on Admission:     Hypernatremia:  Changed the fluids to dextrose fluids. Repeat BMP tonight.   Oropharyngeal dysphagia:  SLP evaluation ordered but patient too  unstable and agitated to participate.   Hypoalbuminemia sec to mod protein calorie mod Dietary consulted.    In view of his multiple medical problems, declining health, severe sepsis, dementia, dysphagia, hypernatremia, palliative care consulted for goals of care.      DVT  prophylaxis: SCD'S Code Status:DNR.  Family Communication: discussed with son over the phone.  Disposition:   Status is: Inpatient  Remains inpatient appropriate because:Ongoing diagnostic testing needed not appropriate for outpatient work up, IV treatments appropriate due to intensity of illness or inability to take PO and Inpatient level of care appropriate due to severity of illness   Dispo: The patient is from: SNF              Anticipated d/c is to: SNF              Anticipated d/c date is: 2 days              Patient currently is not medically stable to d/c.       Consultants:   Palliative care  Gastroenterology.   Procedures: none.   Antimicrobials:  Antibiotics Given (last 72 hours)    Date/Time Action Medication Dose Rate   03/18/20 2003 New Bag/Given   azithromycin (ZITHROMAX) 500 mg in sodium chloride 0.9 % 250 mL IVPB 500 mg 250 mL/hr   03/18/20 2008 New Bag/Given   cefTRIAXone (ROCEPHIN) 1 g in sodium chloride 0.9 % 100 mL IVPB 1 g 200 mL/hr   03/18/20 2311 New Bag/Given   ceFEPIme (MAXIPIME) 2 g in sodium chloride 0.9 % 100 mL IVPB 2 g 200 mL/hr   03/18/20 2330 New Bag/Given   vancomycin (VANCOREADY) IVPB 1500 mg/300 mL 1,500 mg 150 mL/hr   03/19/20 2120 New Bag/Given   ceFEPIme (MAXIPIME) 2 g in sodium chloride 0.9 % 100 mL IVPB 2 g 200 mL/hr         Subjective: Pt agitated .   Objective: Vitals:   03/19/20 2315 03/20/20 0204 03/20/20 0219 03/20/20 0506  BP: 126/80 118/89 (!) 158/68 (!) 135/110  Pulse: 76 74 63 66  Resp: 20 20 20 20   Temp: 98.2 F (36.8 C) 99 F (37.2 C) 98.6 F (37 C) 98.6 F (37 C)  TempSrc:    Oral  SpO2:  100% 100% 100%  Weight:      Height:        Intake/Output Summary (Last 24 hours) at 03/20/2020 1216 Last data filed at 03/20/2020 1111 Gross per 24 hour  Intake 2492.13 ml  Output 750 ml  Net 1742.13 ml   Filed Weights   03/18/20 1834  Weight: 83 kg    Examination:  General exam: Agitated on tactile  stimulation,  respiratory system: Bilateral scattered rhonchi, tachypnea present Cardiovascular system: S1-S2 heard, regular rate rhythm, no JVD Gastrointestinal system: Diminished soft, nontender bowel sounds normal Central nervous system: Confused and agitated Extremities: No cyanosis or clubbing Skin: sacral decubitus ulcer.  Psychiatry: Cannot be assessed    Data Reviewed: I have personally reviewed following labs and imaging studies  CBC: Recent Labs  Lab 03/18/20 1858 03/19/20 0419 03/19/20 1259 03/19/20 2125 03/20/20 0923  WBC 10.3 11.1*  --   --  8.0  NEUTROABS 8.5*  --   --   --   --   HGB 6.8* 6.7* 6.9* 6.6* 9.2*  HCT 24.0* 23.5* 24.2* 23.5* 30.6*  MCV 92.3 95.9  --   --  93.3  PLT 342 257  --   --  623    Basic Metabolic Panel: Recent Labs  Lab 03/18/20 2113 03/19/20 0419 03/19/20 0909 03/19/20 1818 03/20/20 0923  NA 161* 163* 162* 159* 162*  K 4.2 4.7 4.6 3.9 3.3*  CL 125* 129* 129* 127* 130*  CO2 22 22 19* 20* 21*  GLUCOSE 352* 221* 325* 351* 200*  BUN 83* 76* 79* 76* 66*  CREATININE 3.17* 2.70* 2.77* 2.72* 2.45*  CALCIUM 9.0 8.4* 8.3* 8.3* 8.2*  MG  --  2.5*  --   --   --   PHOS  --  3.1  --   --   --     GFR: Estimated Creatinine Clearance: 22 mL/min (A) (by C-G formula based on SCr of 2.45 mg/dL (H)).  Liver Function Tests: Recent Labs  Lab 03/18/20 1858 03/19/20 0419  AST 46* 35  ALT 33 29  ALKPHOS 71 58  BILITOT 0.6 0.8  PROT 6.3* 5.5*  ALBUMIN 2.2* 2.0*    CBG: Recent Labs  Lab 03/19/20 2114 03/20/20 0032 03/20/20 0349 03/20/20 0733 03/20/20 1121  GLUCAP 284* 303* 216* 133* 179*     Recent Results (from the past 240 hour(s))  Respiratory Panel by RT PCR (Flu A&B, Covid) - Nasopharyngeal Swab     Status: None   Collection Time: 03/18/20  6:58 PM   Specimen: Nasopharyngeal Swab  Result Value Ref Range Status   SARS Coronavirus 2 by RT PCR NEGATIVE NEGATIVE Final    Comment: (NOTE) SARS-CoV-2 target nucleic acids are  NOT DETECTED.  The SARS-CoV-2 RNA is generally detectable in upper respiratoy specimens during the acute phase of infection. The lowest concentration of SARS-CoV-2 viral copies this assay can detect is 131 copies/mL. A negative result does not preclude SARS-Cov-2 infection and should not be used as the sole basis for treatment or other patient management decisions. A negative result may occur with  improper specimen collection/handling, submission of specimen other than nasopharyngeal swab, presence of viral mutation(s) within the areas targeted by this assay, and inadequate number of viral copies (<131 copies/mL). A negative result must be combined with clinical observations, patient history, and epidemiological information. The expected result is Negative.  Fact Sheet for Patients:  PinkCheek.be  Fact Sheet for Healthcare Providers:  GravelBags.it  This test is no t yet approved or cleared by the [place] FDA and  has been authorized for detection and/or diagnosis of SARS-CoV-2 by FDA under an Emergency Use Authorization (EUA). This EUA will remain  in effect (meaning this test can be used) for the duration of the COVID-19 declaration under Section 564(b)(1) of the Act, 21 U.S.C. section 360bbb-3(b)(1), unless the authorization is terminated or revoked sooner.     Influenza A by PCR NEGATIVE NEGATIVE Final   Influenza B by PCR NEGATIVE NEGATIVE Final    Comment: (NOTE) The Xpert Xpress SARS-CoV-2/FLU/RSV assay is intended as an aid in  the diagnosis of influenza from Nasopharyngeal swab specimens and  should not be used as a sole basis for treatment. Nasal washings and  aspirates are unacceptable for Xpert Xpress SARS-CoV-2/FLU/RSV  testing.  Fact Sheet for Patients: PinkCheek.be  Fact Sheet for Healthcare Providers: GravelBags.it  This test is not yet  approved or cleared by the [place] FDA and  has been authorized for detection and/or diagnosis of SARS-CoV-2 by  FDA under an Emergency Use Authorization (EUA). This EUA will remain  in effect (meaning this test can be used) for the duration of the  Covid-19 declaration under Section 564(b)(1) of  the Act, 21  U.S.C. section 360bbb-3(b)(1), unless the authorization is  terminated or revoked. Performed at [facility], [address] [place], [place] [address]   Culture, blood (Routine x 2)     Status: None (Preliminary result)   Collection Time: 03/18/20  7:39 PM   Specimen: BLOOD RIGHT ARM  Result Value Ref Range Status   Specimen Description   Final    BLOOD RIGHT ARM Performed at [facility], [address] [place], [place] [address]    Special Requests   Final    BOTTLES DRAWN AEROBIC AND ANAEROBIC Blood Culture adequate volume Performed at [facility] [place], [place] [address]    Culture  Setup Time   Final    GRAM POSITIVE RODS Gram Stain Report Called to,Read Back By and Verified With: KATIE SITES @1257  03/19/20 BY JONES,T  ANAEROBIC BOTTLE ONLY [facility] GRAM NEGATIVE RODS AEROBIC BOTTLE ONLY CRITICAL RESULT CALLED TO, READ BACK BY AND VERIFIED WITH: PHARMD J.MENTENHALL AT 1012 ON 03/20/2020 BY T.SAAD Performed at [facility], [place] [address], [place], [place] [address]    Culture   Final    NO GROWTH 2 DAYS Performed at [facility], [address], [place],  [address]    Report Status PENDING  Incomplete  Culture, blood (Routine x 2)     Status: None (Preliminary result)   Collection Time: 03/18/20  7:39 PM   Specimen: BLOOD RIGHT ARM  Result Value Ref Range Status   Specimen Description BLOOD RIGHT ARM  Final   Special Requests   Final    BOTTLES DRAWN AEROBIC AND ANAEROBIC Blood Culture adequate volume   Culture   Final    NO GROWTH 2 DAYS Performed at [facility], [address], [place],  [address]    Report Status  PENDING  Incomplete  Blood Culture ID Panel (Reflexed)     Status: Abnormal   Collection Time: 03/18/20  7:39 PM  Result Value Ref Range Status   Enterococcus faecalis NOT DETECTED NOT DETECTED Final   Enterococcus Faecium NOT DETECTED NOT DETECTED Final   Listeria monocytogenes NOT DETECTED NOT DETECTED Final   Staphylococcus species NOT DETECTED NOT DETECTED Final   Staphylococcus aureus (BCID) NOT DETECTED NOT DETECTED Final   Staphylococcus epidermidis NOT DETECTED NOT DETECTED Final   Staphylococcus lugdunensis NOT DETECTED NOT DETECTED Final   Streptococcus species NOT DETECTED NOT DETECTED Final   Streptococcus agalactiae NOT DETECTED NOT DETECTED Final   Streptococcus pneumoniae NOT DETECTED NOT DETECTED Final   Streptococcus pyogenes NOT DETECTED NOT DETECTED Final   A.calcoaceticus-baumannii NOT DETECTED NOT DETECTED Final   Bacteroides fragilis NOT DETECTED NOT DETECTED Final   Enterobacterales NOT DETECTED NOT DETECTED Final   Enterobacter cloacae complex NOT DETECTED NOT DETECTED Final   Escherichia coli NOT DETECTED NOT DETECTED Final   Klebsiella aerogenes NOT DETECTED NOT DETECTED Final   Klebsiella oxytoca NOT DETECTED NOT DETECTED Final   Klebsiella pneumoniae NOT DETECTED NOT DETECTED Final   Proteus species NOT DETECTED NOT DETECTED Final   Salmonella species NOT DETECTED NOT DETECTED Final   Serratia marcescens NOT DETECTED NOT DETECTED Final   Haemophilus influenzae NOT DETECTED NOT DETECTED Final   Neisseria meningitidis NOT DETECTED NOT DETECTED Final   Pseudomonas aeruginosa DETECTED (A) NOT DETECTED Final    Comment: CRITICAL RESULT CALLED TO, READ BACK BY AND VERIFIED WITH: PHARM D J.MENTENHALL AT 1012 ON 03/20/2020 BY T.SAAD    Stenotrophomonas maltophilia NOT DETECTED NOT  DETECTED Final   Candida albicans NOT DETECTED NOT DETECTED Final   Candida auris NOT DETECTED NOT DETECTED Final   Candida glabrata NOT DETECTED NOT DETECTED Final   Candida krusei  NOT DETECTED NOT DETECTED Final   Candida parapsilosis NOT DETECTED NOT DETECTED Final   Candida tropicalis NOT DETECTED NOT DETECTED Final   Cryptococcus neoformans/gattii NOT DETECTED NOT DETECTED Final   CTX-M ESBL NOT DETECTED NOT DETECTED Final   Carbapenem resistance IMP NOT DETECTED NOT DETECTED Final   Carbapenem resistance KPC NOT DETECTED NOT DETECTED Final   Carbapenem resistance NDM NOT DETECTED NOT DETECTED Final   Carbapenem resistance VIM NOT DETECTED NOT DETECTED Final    Comment: Performed at [facility] [address], [place], [place] [address]         Radiology Studies: DG Chest 1 View  Result Date: 03/18/2020 CLINICAL DATA:  Altered mental status, elevated glucose EXAM: CHEST  1 VIEW COMPARISON:  Radiograph 02/26/2020 FINDINGS: More coalescent patchy opacities seen in the right medial lung base/infrahilar lung. Additional atelectatic changes and vascular crowding are present as well. Vascularity remains fairly well-defined. No pneumothorax or effusion. The aorta is calcified. The remaining cardiomediastinal contours are unremarkable. No acute osseous or soft tissue abnormality. Degenerative changes are present in the imaged spine and shoulders. Telemetry leads overlie the chest. IMPRESSION: More coalescent patchy opacities in the right medial lung base/infrahilar lung could reflect pneumonia in the appropriate clinical setting. Additional atelectatic changes elsewhere. Aortic Atherosclerosis (ICD10-I70.0). Electronically Signed   By: Lovena Le M.D.   On: 03/18/2020 19:22        Scheduled Meds: . sodium chloride   Intravenous Once  . insulin aspart  0-9 Units Subcutaneous Q4H  . [START ON 03/22/2020] pantoprazole  40 mg Intravenous Q12H   Continuous Infusions: . sodium chloride Stopped (03/18/20 2326)  . ceFEPime (MAXIPIME) IV Stopped (03/19/20 2151)  . dextrose 100 mL/hr at 03/20/20 1111  . metronidazole    . pantoprozole (PROTONIX) infusion 8 mg/hr  (03/20/20 0748)     LOS: 2 days        Hosie Poisson, MD [facility]   To contact the attending provider between 7A-7P or the covering provider during after hours 7P-7A, please log into the web site [email or web address] and access using universal [place] password for that web site. If you do not have the password, please call the hospital operator.  03/20/2020, 12:16 PM

## 2020-03-20 NOTE — Progress Notes (Signed)
Initial Nutrition Assessment  DOCUMENTATION CODES:   Not applicable  INTERVENTION:  Will follow for diet advancement and provide nutrition supplements as appropriate  Monitor for GOC   NUTRITION DIAGNOSIS:   Increased nutrient needs related to acute illness, wound healing (severe sepsis secondary to UTI and pneumonia; pressure injury to sacrum present on admission) as evidenced by estimated needs.    GOAL:  Patient will meet greater than or equal to 90% of their needs    MONITOR:   Labs, Diet advancement, I & O's, Skin, Weight trends, PO intake, Supplement acceptance  REASON FOR ASSESSMENT:   Consult Assessment of nutrition requirement/status  ASSESSMENT:  RD working remotely.  86 year old male with history significant for dementia, DM2, HTN, CKD stage IV, premature atrial contractions, GIB, gastritis, dysphagia, and recent admission 10/9-10/22 due to severe sepsis secondary to Pseudomonas bacteremia [facility] from [facility] with elevated blood glucose, confusion, and fever. Pt admitted for severe sepsis UTI/pneumonia and acute metabolic encephalopathy with baseline dementia.  Pt with baseline confusion and noted a poor historian secondary to dementia. RD working remotely, unable to obtain nutrition history at this time. Per notes, son of pt reports pt was doing fair at SNF and appetite was improving. Pt previously discharged on mechanical soft diet with nectar-thick liquids. He is currently NPO pending BSE. Per chart, weights have trended down 9 lbs (4.7%) in the last month. Insignificant for time frame, however concerning given advanced age with multiple co-morbidities as well as new pressure injury to sacrum present on admission. He is at risk for malnutrition and would benefit nutrition supplements. Will continue to follow for diet advancement and will provide ONS as appropriate.    I/Os: +7872 ml since admit Medications reviewed and include: SSI, Maxipime, Vancomycin,  Protonix in NaCl @ 10 ml/hr  IVF: D5 NaCl @ 100 ml/hr (408 kcal)  Labs: CBGs 133,216,303,284 BUN 76 (H), Cr 2.72 (H), Hgb 6.6 (L) trending down HCT 23.5 (L) trending down  Per notes: -BSE planned for today -family considering hospice options -DNR/DNI, no feeding tube  NUTRITION - FOCUSED PHYSICAL EXAM: Unable to complete at this time, RD working remotely.  Non-pitting BLE edema per flowsheets  Diet Order:   Diet Order            Diet NPO time specified  Diet effective now                 EDUCATION NEEDS:   No education needs have been identified at this time  Skin:  Skin Assessment: Skin Integrity Issues: Skin Integrity Issues:: Other (Comment) Other: Pressure injury; sacrum (not staged per flowsheets)  Last BM:  11/4-type 5  Height:   Ht Readings from Last 1 Encounters:  03/18/20 6' (1.829 m)    Weight:   Wt Readings from Last 1 Encounters:  03/18/20 83 kg    BMI:  Body mass index is 24.82 kg/m.  Estimated Nutritional Needs:   Kcal:  2956-2130  Protein:  110-125  Fluid:  > 2L/day   Lajuan Lines, RD, LDN Clinical Nutrition After Hours/Weekend Pager # in [place]

## 2020-03-20 NOTE — Care Management Important Message (Signed)
Important Message  Patient Details  Name: Paul Perez MRN: [id number] Date of Birth: Feb 09, 1928   Medicare Important Message Given:  Yes     Tommy Medal 03/20/2020, 4:04 PM

## 2020-03-21 LAB — TYPE AND SCREEN
ABO/RH(D): A POS
Antibody Screen: NEGATIVE
Unit division: 0
Unit division: 0
Unit division: 0

## 2020-03-21 LAB — BASIC METABOLIC PANEL
Anion gap: 9 (ref 5–15)
Anion gap: 7 (ref 5–15)
Anion gap: 9 (ref 5–15)
BUN: 53 mg/dL — ABNORMAL HIGH (ref 8–23)
BUN: 47 mg/dL — ABNORMAL HIGH (ref 8–23)
BUN: 45 mg/dL — ABNORMAL HIGH (ref 8–23)
CO2: 21 mmol/L — ABNORMAL LOW (ref 22–32)
CO2: 20 mmol/L — ABNORMAL LOW (ref 22–32)
CO2: 17 mmol/L — ABNORMAL LOW (ref 22–32)
Calcium: 8.1 mg/dL — ABNORMAL LOW (ref 8.9–10.3)
Calcium: 7.9 mg/dL — ABNORMAL LOW (ref 8.9–10.3)
Calcium: 7.9 mg/dL — ABNORMAL LOW (ref 8.9–10.3)
Chloride: 130 mmol/L — ABNORMAL HIGH (ref 98–111)
Chloride: 129 mmol/L — ABNORMAL HIGH (ref 98–111)
Chloride: 126 mmol/L — ABNORMAL HIGH (ref 98–111)
Creatinine, Ser: 2.24 mg/dL — ABNORMAL HIGH (ref 0.61–1.24)
Creatinine, Ser: 2.16 mg/dL — ABNORMAL HIGH (ref 0.61–1.24)
Creatinine, Ser: 2.23 mg/dL — ABNORMAL HIGH (ref 0.61–1.24)
GFR, Estimated: 27 mL/min — ABNORMAL LOW (ref >60)
GFR, Estimated: 28 mL/min — ABNORMAL LOW (ref >60)
GFR, Estimated: 27 mL/min — ABNORMAL LOW (ref >60)
Glucose, Bld: 150 mg/dL — ABNORMAL HIGH (ref 70–99)
Glucose, Bld: 226 mg/dL — ABNORMAL HIGH (ref 70–99)
Glucose, Bld: 312 mg/dL — ABNORMAL HIGH (ref 70–99)
Potassium: 3.3 mmol/L — ABNORMAL LOW (ref 3.5–5.1)
Potassium: 3.2 mmol/L — ABNORMAL LOW (ref 3.5–5.1)
Potassium: 3.3 mmol/L — ABNORMAL LOW (ref 3.5–5.1)
Sodium: 160 mmol/L — ABNORMAL HIGH (ref 135–145)
Sodium: 156 mmol/L — ABNORMAL HIGH (ref 135–145)
Sodium: 152 mmol/L — ABNORMAL HIGH (ref 135–145)

## 2020-03-21 LAB — BPAM RBC
Blood Product Expiration Date: 202111212359
Blood Product Expiration Date: 202111282359
Blood Product Expiration Date: 202111282359
ISSUE DATE / TIME: 202111032243
ISSUE DATE / TIME: 202111042307
ISSUE DATE / TIME: 202111050217
Unit Type and Rh: 6200
Unit Type and Rh: 6200
Unit Type and Rh: 6200

## 2020-03-21 LAB — HEMOGLOBIN AND HEMATOCRIT, BLOOD
HCT: 33.3 % — ABNORMAL LOW (ref 39.0–52.0)
HCT: 29.5 % — ABNORMAL LOW (ref 39.0–52.0)
HCT: 29.6 % — ABNORMAL LOW (ref 39.0–52.0)
Hemoglobin: 9.7 g/dL — ABNORMAL LOW (ref 13.0–17.0)
Hemoglobin: 8.7 g/dL — ABNORMAL LOW (ref 13.0–17.0)
Hemoglobin: 8.7 g/dL — ABNORMAL LOW (ref 13.0–17.0)

## 2020-03-21 LAB — PROCALCITONIN: Procalcitonin: 0.84 ng/mL

## 2020-03-21 LAB — GLUCOSE, CAPILLARY
Glucose-Capillary: 165 mg/dL — ABNORMAL HIGH (ref 70–99)
Glucose-Capillary: 135 mg/dL — ABNORMAL HIGH (ref 70–99)
Glucose-Capillary: 203 mg/dL — ABNORMAL HIGH (ref 70–99)
Glucose-Capillary: 188 mg/dL — ABNORMAL HIGH (ref 70–99)
Glucose-Capillary: 272 mg/dL — ABNORMAL HIGH (ref 70–99)

## 2020-03-21 MED ORDER — GERHARDT'S BUTT CREAM
TOPICAL_CREAM | Freq: Three times a day (TID) | CUTANEOUS | Status: DC
  Filled 2020-03-21: qty 1

## 2020-03-21 MED ORDER — SODIUM CHLORIDE 0.9 % IV SOLN
500.0000 mg | Freq: Two times a day (BID) | INTRAVENOUS | Status: DC
  Administered 2020-03-21 – 2020-03-26 (×10): 500 mg via INTRAVENOUS
  Filled 2020-03-21 (×13): qty 0.5

## 2020-03-21 NOTE — Consult Note (Signed)
WOC Nurse Consult Note: Reason for Consult: Deep tissue pressure injury, DTPI (POA), also partial thickness tissue loss in scrotal area Wound type:Pressure, friction vs moisture associated skin damage (IAD) Pressure Injury POA: Yes Measurement:  Sacral area with purple discoloration (DTPI) measuring 11cm x 9.5cm with no depth. Patient states that area is painful.  Scrotum with 1cm x 1cm x 0.1cm area of partial thickness skin loss with moist pink wound bed, scant serous exudate. Wound bed:As described above Drainage (amount, consistency, odor) As noted above Periwound:intact, dry Dressing procedure/placement/frequency:Discussed with Dr. Karleen Hampshire today on two occasions.  Patient has been agitated and unwilling to allow for long assessment or the taking of a photo to document this pressure area to upload to the EMR. Dr.Akula will attempt again tomorrow. II the interim, I will provide the patient with a mattress replacement with low air loss feature, bilateral heel pressure redistribution heel boots, and guidance for his nurses for topical care of the skin and wound described below as well as turning and repositioning as able. We will use an antimicrobial nonadherent dressing (xeroform) to the DTPI and cover this with a silicone foam dressing to prevent shear force and provide some pressure redistribution. We will add Gerhart's Butt Cream (a 1:1:1 compounded prescriptive consisting of hydrocortisone cream, lotrimin cream and zinc oxide) for the buttocks, perineal, scrotal, medial and posterior thighs.   [place] nursing team will not follow, but will remain available to this patient, the nursing and medical teams.  Please re-consult if needed. Thanks, Maudie Flakes, MSN, RN, [place], Arther Abbott  Pager# [phone]

## 2020-03-21 NOTE — Progress Notes (Signed)
PROGRESS NOTE    Paul Perez  WNI:[id number] DOB: [birth year] DOA: 03/18/2020 PCP: [facility]    Chief Complaint  Patient presents with  . Hyperglycemia    Brief Narrative:  86 year old male prior history of type 2 diabetes, hypertension, stage IV CKD, dementia, GI bleed presents to ED from [facility] for confusion and fever and elevated blood sugars.  Patient unable to provide any history due to underlying dementia.  Patient was found to have right sided pneumonia/ infrahilar pneumonia and urinary tract infection and admitted for severe sepsis. He was also found to be anemic and stool for occult blood is positive. GI consulted , recommended transfusions as needed and conservative management.  In view of his severe sepsis, advanced dementia, clinical deterioration, dysphagia, poor oral intake, hyper natremia not responding to IV treatments, AKI, palliative care consulted for goals of care discussion. He was started on broad spectrum IV antibiotics and his blood cultures were positive for pseudomonas .  Discussed the plan with the patient's son over the phone.  Patient seen and examined at bedside, he remains confused, no oral intake in the last 3 days.     Assessment & Plan:   Principal Problem:   Severe sepsis ([place]) Active Problems:   Hypertension   Hypernatremia   Diabetes mellitus type 2, uncontrolled, with complications (HCC)   Dehydration   CKD (chronic kidney disease) stage 4, GFR 15-29 ml/min (HCC)   Acute febrile illness   Hyperosmolar hyperglycemic state (HHS) (HCC)   GI bleed   Sacral decubitus ulcer, stage II ([place])   Abrasion of left leg   Scrotal skin lesion   Hypoalbuminemia   Acute metabolic encephalopathy   Community acquired pneumonia     Acute metabolic encephalopathy: Superimposed on baseline dementia , probably secondary to severe sepsis from UTI, Pneumonia and Pseudomonas bacteremia Currently NPO and SLP eval ordered, couldn't be  evaluated due to his altered mental status.  Pt on admission, was febrile, tachypneic RR >25/min, with AKI, lactic acid of 6.6, was found to have pneumonia and UTI, Pseudomonas bacteremia. He was started on broad-spectrum IV antibiotics transitioned to IV cefepime.  Continue the same, watch for sensitivities.  Urine cultures show multiple bacteria.   Right middle lobe pneumonia:  On cefepime     Pseudomonas bacteremia Patient is on IV cefepime.  Await for sensitivities.   Essential hypertension Suboptimally controlled as needed hydralazine will be ordered.  Hyperglycemia sec to poorly controlled type 2 DM:  He was started on IV insulin and transitioned to SSI.  CBG (last 3)  Recent Labs    03/21/20 0311 03/21/20 0742 03/21/20 1117  GLUCAP 165* 135* 203*   Continue with sliding scale insulin. No changes in medications.   Acute anemia of blood loss possibly GI source.  Hemoccult is positive.  He underwent 2 units of PRBC transfusion his repeat hemoglobin has improved to 9.7 to 8.7.  GI consulted and recommendations given, plan for conservative management. Plan for upper endoscopy when patient is more stable, if family wants aggressive measures.  Continue with IV PPI BID.    Acute kidney injury on Stage IV CKD,  Monitor renal parameters while on IV fluids.  Creatinine has improved from 3.7-2.24 Avoid nephrotoxic agents/ dehydration/ hypotension.  Continue with IV fluid resuscitation.    Stage 2 sacral decubitus ulcer:  Wound care consulted.  Pressure Injury 03/19/20 Sacrum (Active)  03/19/20 1600  Location: Sacrum  Location Orientation:   Staging:   Wound Description (  Comments):   Present on Admission:     Hypernatremia:  From free water deficit about 7 lit approximately. Secondary to poor oral intake and dehydration from advanced dementia Changed the fluids to dextrose fluids. Increased the rate to 130ml/hr.   Oropharyngeal dysphagia:  SLP evaluation ordered  but patient too unstable and agitated to participate. Discussed with the son over the phone, who will get back to [place] on Monday.    Hypoalbuminemia sec to mod protein calorie mod Dietary consulted.    In view of his multiple medical problems, declining health, severe sepsis, dementia, dysphagia, hypernatremia, palliative care consulted for goals of care.      DVT prophylaxis: SCD'S Code Status:DNR.  Family Communication: discussed with son over the phone.  Disposition:   Status is: Inpatient  Remains inpatient appropriate because:Ongoing diagnostic testing needed not appropriate for outpatient work up, IV treatments appropriate due to intensity of illness or inability to take PO and Inpatient level of care appropriate due to severity of illness   Dispo: The patient is from: SNF              Anticipated d/c is to: SNF              Anticipated d/c date is: 2 days              Patient currently is not medically stable to d/c.       Consultants:   Palliative care  Gastroenterology.   Procedures: none.   Antimicrobials:  Antibiotics Given (last 72 hours)    Date/Time Action Medication Dose Rate   03/18/20 2003 New Bag/Given   azithromycin (ZITHROMAX) 500 mg in sodium chloride 0.9 % 250 mL IVPB 500 mg 250 mL/hr   03/18/20 2008 New Bag/Given   cefTRIAXone (ROCEPHIN) 1 g in sodium chloride 0.9 % 100 mL IVPB 1 g 200 mL/hr   03/18/20 2311 New Bag/Given   ceFEPIme (MAXIPIME) 2 g in sodium chloride 0.9 % 100 mL IVPB 2 g 200 mL/hr   03/18/20 2330 New Bag/Given   vancomycin (VANCOREADY) IVPB 1500 mg/300 mL 1,500 mg 150 mL/hr   03/19/20 2120 New Bag/Given   ceFEPIme (MAXIPIME) 2 g in sodium chloride 0.9 % 100 mL IVPB 2 g 200 mL/hr   03/20/20 1253 New Bag/Given   metroNIDAZOLE (FLAGYL) IVPB 500 mg 500 mg 100 mL/hr   03/20/20 1927 New Bag/Given   metroNIDAZOLE (FLAGYL) IVPB 500 mg 500 mg 100 mL/hr   03/20/20 2100 New Bag/Given   ceFEPIme (MAXIPIME) 2 g in sodium chloride 0.9 %  100 mL IVPB 2 g 200 mL/hr   03/21/20 0305 New Bag/Given   metroNIDAZOLE (FLAGYL) IVPB 500 mg 500 mg 100 mL/hr   03/21/20 1135 New Bag/Given   metroNIDAZOLE (FLAGYL) IVPB 500 mg 500 mg 100 mL/hr         Subjective: Pt confused.   Objective: Vitals:   03/20/20 1405 03/20/20 2013 03/20/20 2103 03/21/20 0515  BP:   (!) 151/68 (!) 148/58  Pulse:   (!) 59 (!) 54  Resp:   18 18  Temp: 98.5 F (36.9 C)  98.1 F (36.7 C) 98.6 F (37 C)  TempSrc: Oral  Oral Oral  SpO2:  97% 100% 100%  Weight:      Height:        Intake/Output Summary (Last 24 hours) at 03/21/2020 1320 Last data filed at 03/21/2020 0600 Gross per 24 hour  Intake 1798.84 ml  Output 750 ml  Net 1048.84 ml  Filed Weights   03/18/20 1834  Weight: 83 kg    Examination:  General exam: chronically ill appearing gentleman, on 2 lit of [place] oxygen, moaning on exam.  respiratory system: Bilateral rhonchi. No tachypnea, no wheezing.  Cardiovascular system: S1S2, RRR, no JVD. No pedal edema.  Gastrointestinal system: abdomen is soft, non tender non distended, bowel sounds nwl.  Central nervous system: confused and restless, does not open eyes, is not following commands.  Extremities: No PEDAL EDEMA.  Skin: sacral decubitus ulcer.  Psychiatry: Cannot be assessed.     Data Reviewed: I have personally reviewed following labs and imaging studies  CBC: Recent Labs  Lab 03/18/20 1858 03/18/20 1858 03/19/20 0419 03/19/20 1259 03/20/20 0923 03/20/20 1447 03/20/20 1839 03/21/20 0608 03/21/20 1233  WBC 10.3  --  11.1*  --  8.0  --   --   --   --   NEUTROABS 8.5*  --   --   --   --   --   --   --   --   HGB 6.8*   < > 6.7*   < > 9.2* 9.0* 9.4* 9.7* 8.7*  HCT 24.0*   < > 23.5*   < > 30.6* 30.9* 31.2* 33.3* 29.5*  MCV 92.3  --  95.9  --  93.3  --   --   --   --   PLT 342  --  257  --  218  --   --   --   --    < > = values in this interval not displayed.    Basic Metabolic Panel: Recent Labs  Lab  03/19/20 0419 03/19/20 0419 03/19/20 0909 03/19/20 1818 03/20/20 0923 03/20/20 1839 03/21/20 0608  NA 163*   < > 162* 159* 162* 160* 160*  K 4.7   < > 4.6 3.9 3.3* 3.5 3.3*  CL 129*   < > 129* 127* 130* 130* 130*  CO2 22   < > 19* 20* 21* 21* 21*  GLUCOSE 221*   < > 325* 351* 200* 320* 150*  BUN 76*   < > 79* 76* 66* 58* 53*  CREATININE 2.70*   < > 2.77* 2.72* 2.45* 2.44* 2.24*  CALCIUM 8.4*   < > 8.3* 8.3* 8.2* 8.0* 8.1*  MG 2.5*  --   --   --   --   --   --   PHOS 3.1  --   --   --   --   --   --    < > = values in this interval not displayed.    GFR: Estimated Creatinine Clearance: 24.1 mL/min (A) (by C-G formula based on SCr of 2.24 mg/dL (H)).  Liver Function Tests: Recent Labs  Lab 03/18/20 1858 03/19/20 0419  AST 46* 35  ALT 33 29  ALKPHOS 71 58  BILITOT 0.6 0.8  PROT 6.3* 5.5*  ALBUMIN 2.2* 2.0*    CBG: Recent Labs  Lab 03/20/20 1953 03/20/20 2358 03/21/20 0311 03/21/20 0742 03/21/20 1117  GLUCAP 250* 198* 165* 135* 203*     Recent Results (from the past 240 hour(s))  Respiratory Panel by RT PCR (Flu A&B, Covid) - Nasopharyngeal Swab     Status: None   Collection Time: 03/18/20  6:58 PM   Specimen: Nasopharyngeal Swab  Result Value Ref Range Status   SARS Coronavirus 2 by RT PCR NEGATIVE NEGATIVE Final    Comment: (NOTE) SARS-CoV-2 target nucleic acids are NOT DETECTED.  The  SARS-CoV-2 RNA is generally detectable in upper respiratoy specimens during the acute phase of infection. The lowest concentration of SARS-CoV-2 viral copies this assay can detect is 131 copies/mL. A negative result does not preclude SARS-Cov-2 infection and should not be used as the sole basis for treatment or other patient management decisions. A negative result may occur with  improper specimen collection/handling, submission of specimen other than nasopharyngeal swab, presence of viral mutation(s) within the areas targeted by this assay, and inadequate number of viral  copies (<131 copies/mL). A negative result must be combined with clinical observations, patient history, and epidemiological information. The expected result is Negative.  Fact Sheet for Patients:  PinkCheek.be  Fact Sheet for Healthcare Providers:  GravelBags.it  This test is no t yet approved or cleared by the [place] FDA and  has been authorized for detection and/or diagnosis of SARS-CoV-2 by FDA under an Emergency Use Authorization (EUA). This EUA will remain  in effect (meaning this test can be used) for the duration of the COVID-19 declaration under Section 564(b)(1) of the Act, 21 U.S.C. section 360bbb-3(b)(1), unless the authorization is terminated or revoked sooner.     Influenza A by PCR NEGATIVE NEGATIVE Final   Influenza B by PCR NEGATIVE NEGATIVE Final    Comment: (NOTE) The Xpert Xpress SARS-CoV-2/FLU/RSV assay is intended as an aid in  the diagnosis of influenza from Nasopharyngeal swab specimens and  should not be used as a sole basis for treatment. Nasal washings and  aspirates are unacceptable for Xpert Xpress SARS-CoV-2/FLU/RSV  testing.  Fact Sheet for Patients: PinkCheek.be  Fact Sheet for Healthcare Providers: GravelBags.it  This test is not yet approved or cleared by the [place] FDA and  has been authorized for detection and/or diagnosis of SARS-CoV-2 by  FDA under an Emergency Use Authorization (EUA). This EUA will remain  in effect (meaning this test can be used) for the duration of the  Covid-19 declaration under Section 564(b)(1) of the Act, 21  U.S.C. section 360bbb-3(b)(1), unless the authorization is  terminated or revoked. Performed at [facility], [address] [place], [place] [address]   Culture, blood (Routine x 2)     Status: Abnormal (Preliminary result)   Collection Time: 03/18/20  7:39 PM   Specimen: BLOOD  RIGHT ARM  Result Value Ref Range Status   Specimen Description   Final    BLOOD RIGHT ARM Performed at [facility], [address] [place], [place] [address]    Special Requests   Final    BOTTLES DRAWN AEROBIC AND ANAEROBIC Blood Culture adequate volume Performed at [facility], [address] [place], [place] [address]    Culture  Setup Time   Final    GRAM POSITIVE RODS Gram Stain Report Called to,Read Back By and Verified With: KATIE SITES @1257  03/19/20 BY JONES,T  ANAEROBIC BOTTLE ONLY [facility] GRAM NEGATIVE RODS AEROBIC BOTTLE ONLY CRITICAL RESULT CALLED TO, READ BACK BY AND VERIFIED WITH: PHARMD J.MENTENHALL AT 1012 ON 03/20/2020 BY T.SAAD Performed at [facility], [place] [address], [place],  [address]    Culture CLOSTRIDIUM RAMOSUM PSEUDOMONAS AERUGINOSA  (A)  Final   Report Status PENDING  Incomplete  Culture, blood (Routine x 2)     Status: None (Preliminary result)   Collection Time: 03/18/20  7:39 PM   Specimen: BLOOD RIGHT ARM  Result Value Ref Range Status   Specimen Description BLOOD RIGHT ARM  Final   Special Requests   Final  BOTTLES DRAWN AEROBIC AND ANAEROBIC Blood Culture adequate volume   Culture  Setup Time   Final    GRAM NEGATIVE RODS AEROBIC BOTTLE Gram Stain Report Called to,Read Back By and Verified With: PARSONS,L AT 5409 ON 03/20/20 BY HUFFINES,S.    Culture   Final    NO GROWTH 2 DAYS Performed at [facility], [address], [place], [place] [address]    Report Status PENDING  Incomplete  Blood Culture ID Panel (Reflexed)     Status: Abnormal   Collection Time: 03/18/20  7:39 PM  Result Value Ref Range Status   Enterococcus faecalis NOT DETECTED NOT DETECTED Final   Enterococcus Faecium NOT DETECTED NOT DETECTED Final   Listeria monocytogenes NOT DETECTED NOT DETECTED Final   Staphylococcus species NOT DETECTED NOT DETECTED Final   Staphylococcus aureus (BCID) NOT DETECTED NOT DETECTED Final   Staphylococcus epidermidis NOT  DETECTED NOT DETECTED Final   Staphylococcus lugdunensis NOT DETECTED NOT DETECTED Final   Streptococcus species NOT DETECTED NOT DETECTED Final   Streptococcus agalactiae NOT DETECTED NOT DETECTED Final   Streptococcus pneumoniae NOT DETECTED NOT DETECTED Final   Streptococcus pyogenes NOT DETECTED NOT DETECTED Final   A.calcoaceticus-baumannii NOT DETECTED NOT DETECTED Final   Bacteroides fragilis NOT DETECTED NOT DETECTED Final   Enterobacterales NOT DETECTED NOT DETECTED Final   Enterobacter cloacae complex NOT DETECTED NOT DETECTED Final   Escherichia coli NOT DETECTED NOT DETECTED Final   Klebsiella aerogenes NOT DETECTED NOT DETECTED Final   Klebsiella oxytoca NOT DETECTED NOT DETECTED Final   Klebsiella pneumoniae NOT DETECTED NOT DETECTED Final   Proteus species NOT DETECTED NOT DETECTED Final   Salmonella species NOT DETECTED NOT DETECTED Final   Serratia marcescens NOT DETECTED NOT DETECTED Final   Haemophilus influenzae NOT DETECTED NOT DETECTED Final   Neisseria meningitidis NOT DETECTED NOT DETECTED Final   Pseudomonas aeruginosa DETECTED (A) NOT DETECTED Final    Comment: CRITICAL RESULT CALLED TO, READ BACK BY AND VERIFIED WITH: PHARM D J.MENTENHALL AT 1012 ON 03/20/2020 BY T.SAAD    Stenotrophomonas maltophilia NOT DETECTED NOT DETECTED Final   Candida albicans NOT DETECTED NOT DETECTED Final   Candida auris NOT DETECTED NOT DETECTED Final   Candida glabrata NOT DETECTED NOT DETECTED Final   Candida krusei NOT DETECTED NOT DETECTED Final   Candida parapsilosis NOT DETECTED NOT DETECTED Final   Candida tropicalis NOT DETECTED NOT DETECTED Final   Cryptococcus neoformans/gattii NOT DETECTED NOT DETECTED Final   CTX-M ESBL NOT DETECTED NOT DETECTED Final   Carbapenem resistance IMP NOT DETECTED NOT DETECTED Final   Carbapenem resistance KPC NOT DETECTED NOT DETECTED Final   Carbapenem resistance NDM NOT DETECTED NOT DETECTED Final   Carbapenem resistance VIM NOT  DETECTED NOT DETECTED Final    Comment: Performed at [facility], [place] [address] [place], [place] [address]  Urine culture     Status: Abnormal   Collection Time: 03/19/20  7:10 AM   Specimen: In/Out Cath Urine  Result Value Ref Range Status   Specimen Description   Final    IN/OUT CATH URINE Performed at [facility], [address] [place], [place] [address]    Special Requests   Final    NONE Performed at [facility], [address], [place], [place] [address]    Culture MULTIPLE SPECIES PRESENT, SUGGEST RECOLLECTION (A)  Final   Report Status 03/20/2020 FINAL  Final         Radiology Studies: No results found.  Scheduled Meds: . sodium chloride   Intravenous Once  . insulin aspart  0-9 Units Subcutaneous Q4H  . [START ON 03/22/2020] pantoprazole  40 mg Intravenous Q12H   Continuous Infusions: . sodium chloride Stopped (03/18/20 2326)  . ceFEPime (MAXIPIME) IV Stopped (03/20/20 2130)  . dextrose 100 mL/hr at 03/21/20 1132  . metronidazole 500 mg (03/21/20 1135)  . pantoprozole (PROTONIX) infusion 8 mg/hr (03/21/20 0850)     LOS: 3 days        Hosie Poisson, MD [facility]   To contact the attending provider between 7A-7P or the covering provider during after hours 7P-7A, please log into the web site [email or web address] and access using universal [place] password for that web site. If you do not have the password, please call the hospital operator.  03/21/2020, 1:20 PM

## 2020-03-21 NOTE — Progress Notes (Signed)
Patient transferred to new mattress as ordered by [facility] nurse.

## 2020-03-22 LAB — CBC
HCT: 31.3 % — ABNORMAL LOW (ref 39.0–52.0)
Hemoglobin: 9.2 g/dL — ABNORMAL LOW (ref 13.0–17.0)
MCH: 27.5 pg (ref 26.0–34.0)
MCHC: 29.4 g/dL — ABNORMAL LOW (ref 30.0–36.0)
MCV: 93.7 fL (ref 80.0–100.0)
Platelets: 157 10*3/uL (ref 150–400)
RBC: 3.34 MIL/uL — ABNORMAL LOW (ref 4.22–5.81)
RDW: 15.2 % (ref 11.5–15.5)
WBC: 7.4 10*3/uL (ref 4.0–10.5)
nRBC: 0 % (ref 0.0–0.2)

## 2020-03-22 LAB — BASIC METABOLIC PANEL
Anion gap: 8 (ref 5–15)
Anion gap: 6 (ref 5–15)
BUN: 39 mg/dL — ABNORMAL HIGH (ref 8–23)
BUN: 36 mg/dL — ABNORMAL HIGH (ref 8–23)
CO2: 20 mmol/L — ABNORMAL LOW (ref 22–32)
CO2: 20 mmol/L — ABNORMAL LOW (ref 22–32)
Calcium: 8 mg/dL — ABNORMAL LOW (ref 8.9–10.3)
Calcium: 7.8 mg/dL — ABNORMAL LOW (ref 8.9–10.3)
Chloride: 124 mmol/L — ABNORMAL HIGH (ref 98–111)
Chloride: 123 mmol/L — ABNORMAL HIGH (ref 98–111)
Creatinine, Ser: 2.11 mg/dL — ABNORMAL HIGH (ref 0.61–1.24)
Creatinine, Ser: 2.03 mg/dL — ABNORMAL HIGH (ref 0.61–1.24)
GFR, Estimated: 29 mL/min — ABNORMAL LOW (ref >60)
GFR, Estimated: 31 mL/min — ABNORMAL LOW (ref >60)
Glucose, Bld: 208 mg/dL — ABNORMAL HIGH (ref 70–99)
Glucose, Bld: 214 mg/dL — ABNORMAL HIGH (ref 70–99)
Potassium: 3.4 mmol/L — ABNORMAL LOW (ref 3.5–5.1)
Potassium: 3 mmol/L — ABNORMAL LOW (ref 3.5–5.1)
Sodium: 152 mmol/L — ABNORMAL HIGH (ref 135–145)
Sodium: 149 mmol/L — ABNORMAL HIGH (ref 135–145)

## 2020-03-22 LAB — GLUCOSE, CAPILLARY
Glucose-Capillary: 190 mg/dL — ABNORMAL HIGH (ref 70–99)
Glucose-Capillary: 193 mg/dL — ABNORMAL HIGH (ref 70–99)
Glucose-Capillary: 194 mg/dL — ABNORMAL HIGH (ref 70–99)
Glucose-Capillary: 202 mg/dL — ABNORMAL HIGH (ref 70–99)
Glucose-Capillary: 206 mg/dL — ABNORMAL HIGH (ref 70–99)
Glucose-Capillary: 218 mg/dL — ABNORMAL HIGH (ref 70–99)

## 2020-03-22 LAB — CULTURE, BLOOD (ROUTINE X 2): Special Requests: ADEQUATE

## 2020-03-22 LAB — MAGNESIUM: Magnesium: 2.4 mg/dL (ref 1.7–2.4)

## 2020-03-22 LAB — HEMOGLOBIN AND HEMATOCRIT, BLOOD
HCT: 30.8 % — ABNORMAL LOW (ref 39.0–52.0)
Hemoglobin: 9.2 g/dL — ABNORMAL LOW (ref 13.0–17.0)

## 2020-03-22 MED ORDER — POTASSIUM CHLORIDE 10 MEQ/100ML IV SOLN
10.0000 meq | INTRAVENOUS | Status: AC
  Administered 2020-03-22 (×3): 10 meq via INTRAVENOUS
  Filled 2020-03-22: qty 100

## 2020-03-22 NOTE — Progress Notes (Signed)
PROGRESS NOTE    Paul Perez  FBP:[id number] DOB: Aug 17, 1927 DOA: 03/18/2020 PCP: [facility]    Chief Complaint  Patient presents with  . Hyperglycemia    Brief Narrative:  86 year old male prior history of type 2 diabetes, hypertension, stage IV CKD, dementia, GI bleed presents to ED from [facility] for confusion and fever and elevated blood sugars.  Patient unable to provide any history due to underlying dementia.  Patient was found to have right sided pneumonia/ infrahilar pneumonia and urinary tract infection and admitted for severe sepsis. He was also found to be hypernatremic, AKI,  anemic and stool for occult blood is positive. GI consulted , recommended transfusions as needed and conservative management.  In view of his severe sepsis, advanced dementia, clinical deterioration, dysphagia, poor oral intake, hyper natremia not responding to IV treatments, AKI, palliative care consulted for goals of care discussion. He was found to have Pseudomonas bacteremia and was started on broad-spectrum IV antibiotics. His mental status improved with improvement in sodium. Patient seen and examined at bedside is currently not agitated or morning on verbal or tactile stimulation      Assessment & Plan:   Principal Problem:   Severe sepsis ([place]) Active Problems:   Hypertension   Hypernatremia   Diabetes mellitus type 2, uncontrolled, with complications (HCC)   Dehydration   CKD (chronic kidney disease) stage 4, GFR 15-29 ml/min (HCC)   Acute febrile illness   Hyperosmolar hyperglycemic state (HHS) (HCC)   GI bleed   Sacral decubitus ulcer, stage II ([place])   Abrasion of left leg   Scrotal skin lesion   Hypoalbuminemia   Acute metabolic encephalopathy   Community acquired pneumonia     Acute metabolic encephalopathy: Superimposed on baseline dementia , probably secondary to severe sepsis from UTI, Pneumonia and Pseudomonas bacteremia Currently NPO and SLP eval  ordered, couldn't be evaluated due to his altered mental status.  Pt on admission, was febrile, tachypneic RR >25/min, with AKI, lactic acid of 6.6, was found to have pneumonia and UTI, Pseudomonas bacteremia.  Sepsis physiology improving.  Patient is afebrile heart rate has improved to 82/min respiratory rate at 18/min with 100% saturation on 2 L of nasal cannula oxygen. He was started initially on IV cefepime and transition to IV meropenem as his blood cultures grew Clostridium and Pseudomonas.  We will repeat blood cultures today.  Plan to wean him off the oxygen in the next 24 hours. Urine cultures show multiple bacteria.     Pseudomonas bacteremia Continue with IV meropenem.   Essential hypertension Blood pressure parameters are optimal  Hyperglycemia sec to poorly controlled type 2 DM:  He was started on IV insulin and transitioned to SSI.  CBG (last 3)  Recent Labs    03/22/20 0321 03/22/20 0728 03/22/20 1119  GLUCAP 194* 193* 190*   Continue with sliding scale insulin. No changes in medications.   Acute anemia of blood loss possibly GI source.  Hemoccult is positive.  He underwent 2 units of PRBC transfusion hemoglobin has been stable around 9 posttransfusion GI consulted and recommendations given, plan for conservative management. Plan for upper endoscopy when patient is more stable, if family wants aggressive measures.  Continue with IV PPI BID.    Acute kidney injury on Stage IV CKD,  Monitor renal parameters while on IV fluids.  Creatinine has improved from 3.7-2 today Avoid nephrotoxic agents/ dehydration/ hypotension.  Continue with IV fluid resuscitation.    Stage 2 sacral decubitus  ulcer:  Wound care consulted.  Pressure Injury 03/19/20 Sacrum (Active)  03/19/20 1600  Location: Sacrum  Location Orientation:   Staging:   Wound Description (Comments):   Present on Admission:     Hypernatremia:  From free water deficit about 7 lit  approximately. Secondary to poor oral intake and dehydration from advanced dementia Sodium improved from 162-149 today.    Hypokalemia Replaced, get magnesium levels  Oropharyngeal dysphagia:  SLP evaluation ordered but patient too unstable and agitated to participate. Discussed with the son over the phone, who will get back to [place] on Monday.    Hypoalbuminemia sec to mod protein calorie mod Dietary consulted.    In view of his multiple medical problems, declining health, severe sepsis, dementia, dysphagia, hypernatremia, palliative care consulted for goals of care.      DVT prophylaxis: SCD'S Code Status:DNR.  Family Communication: discussed with son over the phone.  Disposition:   Status is: Inpatient  Remains inpatient appropriate because:Ongoing diagnostic testing needed not appropriate for outpatient work up, IV treatments appropriate due to intensity of illness or inability to take PO and Inpatient level of care appropriate due to severity of illness   Dispo: The patient is from: SNF              Anticipated d/c is to: SNF              Anticipated d/c date is: 2 days              Patient currently is not medically stable to d/c.       Consultants:   Palliative care  Gastroenterology.   Procedures: none.   Antimicrobials:  Antibiotics Given (last 72 hours)    Date/Time Action Medication Dose Rate   03/19/20 2120 New Bag/Given   ceFEPIme (MAXIPIME) 2 g in sodium chloride 0.9 % 100 mL IVPB 2 g 200 mL/hr   03/20/20 1253 New Bag/Given   metroNIDAZOLE (FLAGYL) IVPB 500 mg 500 mg 100 mL/hr   03/20/20 1927 New Bag/Given   metroNIDAZOLE (FLAGYL) IVPB 500 mg 500 mg 100 mL/hr   03/20/20 2100 New Bag/Given   ceFEPIme (MAXIPIME) 2 g in sodium chloride 0.9 % 100 mL IVPB 2 g 200 mL/hr   03/21/20 0305 New Bag/Given   metroNIDAZOLE (FLAGYL) IVPB 500 mg 500 mg 100 mL/hr   03/21/20 1135 New Bag/Given   metroNIDAZOLE (FLAGYL) IVPB 500 mg 500 mg 100 mL/hr   03/21/20  1710 New Bag/Given   meropenem (MERREM) 500 mg in sodium chloride 0.9 % 100 mL IVPB 500 mg 200 mL/hr   03/22/20 0441 New Bag/Given   meropenem (MERREM) 500 mg in sodium chloride 0.9 % 100 mL IVPB 500 mg 200 mL/hr   03/22/20 1511 New Bag/Given   meropenem (MERREM) 500 mg in sodium chloride 0.9 % 100 mL IVPB 500 mg 200 mL/hr         Subjective: Patient is not agitated as yesterday, but still confused no oral intake  Objective: Vitals:   03/21/20 0515 03/21/20 1643 03/21/20 2100 03/22/20 0600  BP: (!) 148/58 (!) 175/86 (!) 183/77 (!) 147/93  Pulse: (!) 54 90 86 82  Resp: 18 18 16 18   Temp: 98.6 F (37 C) 99.1 F (37.3 C) 99.2 F (37.3 C) 98.2 F (36.8 C)  TempSrc: Oral Oral Axillary Oral  SpO2: 100% 100% 100% 100%  Weight:      Height:        Intake/Output Summary (Last 24 hours) at 03/22/2020 1517  Last data filed at 03/22/2020 0400 Gross per 24 hour  Intake 2822.59 ml  Output 400 ml  Net 2422.59 ml   Filed Weights   03/18/20 1834  Weight: 83 kg    Examination:  General exam: Chronically ill-appearing gentleman on 2 L of nasal cannula oxygen and appears comfortable not in any distress respiratory system: Bilateral rhonchi, no wheezing heard no tachypnea Cardiovascular system: S1-S2 heard, regular rate rhythm, no pedal edema. Gastrointestinal system: Abdomen is soft, nontender bowel sounds normal Central nervous system: Patient alert opens eyes on verbal cues, appears to be confused patient has advanced dementia. Extremities: No pedal edema Skin: Sacral decubitus ulcer Psychiatry: Cannot be assessed.     Data Reviewed: I have personally reviewed following labs and imaging studies  CBC: Recent Labs  Lab 03/18/20 1858 03/18/20 1858 03/19/20 0419 03/19/20 1259 03/20/20 0923 03/20/20 1447 03/20/20 1839 03/21/20 0608 03/21/20 1233 03/21/20 2216 03/22/20 1102  WBC 10.3  --  11.1*  --  8.0  --   --   --   --   --  7.4  NEUTROABS 8.5*  --   --   --   --    --   --   --   --   --   --   HGB 6.8*   < > 6.7*   < > 9.2*   < > 9.4* 9.7* 8.7* 8.7* 9.2*  9.2*  HCT 24.0*   < > 23.5*   < > 30.6*   < > 31.2* 33.3* 29.5* 29.6* 31.3*  30.8*  MCV 92.3  --  95.9  --  93.3  --   --   --   --   --  93.7  PLT 342  --  257  --  218  --   --   --   --   --  157   < > = values in this interval not displayed.    Basic Metabolic Panel: Recent Labs  Lab 03/19/20 0419 03/19/20 0909 03/21/20 0608 03/21/20 1454 03/21/20 2217 03/22/20 0605 03/22/20 1414  NA 163*   < > 160* 156* 152* 152* 149*  K 4.7   < > 3.3* 3.2* 3.3* 3.4* 3.0*  CL 129*   < > 130* 129* 126* 124* 123*  CO2 22   < > 21* 20* 17* 20* 20*  GLUCOSE 221*   < > 150* 226* 312* 208* 214*  BUN 76*   < > 53* 47* 45* 39* 36*  CREATININE 2.70*   < > 2.24* 2.16* 2.23* 2.11* 2.03*  CALCIUM 8.4*   < > 8.1* 7.9* 7.9* 8.0* 7.8*  MG 2.5*  --   --   --   --   --   --   PHOS 3.1  --   --   --   --   --   --    < > = values in this interval not displayed.    GFR: Estimated Creatinine Clearance: 26.5 mL/min (A) (by C-G formula based on SCr of 2.03 mg/dL (H)).  Liver Function Tests: Recent Labs  Lab 03/18/20 1858 03/19/20 0419  AST 46* 35  ALT 33 29  ALKPHOS 71 58  BILITOT 0.6 0.8  PROT 6.3* 5.5*  ALBUMIN 2.2* 2.0*    CBG: Recent Labs  Lab 03/21/20 2033 03/22/20 0036 03/22/20 0321 03/22/20 0728 03/22/20 1119  GLUCAP 272* 218* 194* 193* 190*     Recent Results (from the past 240 hour(s))  Respiratory  Panel by RT PCR (Flu A&B, Covid) - Nasopharyngeal Swab     Status: None   Collection Time: 03/18/20  6:58 PM   Specimen: Nasopharyngeal Swab  Result Value Ref Range Status   SARS Coronavirus 2 by RT PCR NEGATIVE NEGATIVE Final    Comment: (NOTE) SARS-CoV-2 target nucleic acids are NOT DETECTED.  The SARS-CoV-2 RNA is generally detectable in upper respiratoy specimens during the acute phase of infection. The lowest concentration of SARS-CoV-2 viral copies this assay can detect is 131  copies/mL. A negative result does not preclude SARS-Cov-2 infection and should not be used as the sole basis for treatment or other patient management decisions. A negative result may occur with  improper specimen collection/handling, submission of specimen other than nasopharyngeal swab, presence of viral mutation(s) within the areas targeted by this assay, and inadequate number of viral copies (<131 copies/mL). A negative result must be combined with clinical observations, patient history, and epidemiological information. The expected result is Negative.  Fact Sheet for Patients:  PinkCheek.be  Fact Sheet for Healthcare Providers:  GravelBags.it  This test is no t yet approved or cleared by the [place] FDA and  has been authorized for detection and/or diagnosis of SARS-CoV-2 by FDA under an Emergency Use Authorization (EUA). This EUA will remain  in effect (meaning this test can be used) for the duration of the COVID-19 declaration under Section 564(b)(1) of the Act, 21 U.S.C. section 360bbb-3(b)(1), unless the authorization is terminated or revoked sooner.     Influenza A by PCR NEGATIVE NEGATIVE Final   Influenza B by PCR NEGATIVE NEGATIVE Final    Comment: (NOTE) The Xpert Xpress SARS-CoV-2/FLU/RSV assay is intended as an aid in  the diagnosis of influenza from Nasopharyngeal swab specimens and  should not be used as a sole basis for treatment. Nasal washings and  aspirates are unacceptable for Xpert Xpress SARS-CoV-2/FLU/RSV  testing.  Fact Sheet for Patients: PinkCheek.be  Fact Sheet for Healthcare Providers: GravelBags.it  This test is not yet approved or cleared by the [place] FDA and  has been authorized for detection and/or diagnosis of SARS-CoV-2 by  FDA under an Emergency Use Authorization (EUA). This EUA will remain  in effect (meaning  this test can be used) for the duration of the  Covid-19 declaration under Section 564(b)(1) of the Act, 21  U.S.C. section 360bbb-3(b)(1), unless the authorization is  terminated or revoked. Performed at [facility], [address], [place], [place] [address]   Culture, blood (Routine x 2)     Status: Abnormal   Collection Time: 03/18/20  7:39 PM   Specimen: BLOOD RIGHT ARM  Result Value Ref Range Status   Specimen Description   Final    BLOOD RIGHT ARM Performed at [facility], [address] [place], [place] [address]    Special Requests   Final    BOTTLES DRAWN AEROBIC AND ANAEROBIC Blood Culture adequate volume Performed at [address] [place], [place] [address]    Culture  Setup Time   Final    GRAM POSITIVE RODS Gram Stain Report Called to,Read Back By and Verified With: KATIE SITES @1257  03/19/20 BY JONES,T  ANAEROBIC BOTTLE ONLY [facility] GRAM NEGATIVE RODS AEROBIC BOTTLE ONLY CRITICAL RESULT CALLED TO, READ BACK BY AND VERIFIED WITH: PHARMD J.MENTENHALL AT 1012 ON 03/20/2020 BY T.SAAD Performed at [facility], [place] [address], [place], [place] [address]    Culture CLOSTRIDIUM RAMOSUM PSEUDOMONAS AERUGINOSA  (A)  Final  Report Status 03/22/2020 FINAL  Final   Organism ID, Bacteria PSEUDOMONAS AERUGINOSA  Final      Susceptibility   Pseudomonas aeruginosa - MIC*    CEFTAZIDIME 4 SENSITIVE Sensitive     CIPROFLOXACIN <=0.25 SENSITIVE Sensitive     GENTAMICIN <=1 SENSITIVE Sensitive     IMIPENEM 2 SENSITIVE Sensitive     PIP/TAZO 16 SENSITIVE Sensitive     * PSEUDOMONAS AERUGINOSA  Culture, blood (Routine x 2)     Status: Abnormal   Collection Time: 03/18/20  7:39 PM   Specimen: BLOOD RIGHT ARM  Result Value Ref Range Status   Specimen Description   Final    BLOOD RIGHT ARM Performed at [facility], [address] [place], [place] [address]    Special Requests   Final    BOTTLES DRAWN AEROBIC AND ANAEROBIC Blood Culture adequate  volume Performed at [facility], [address] [place], [place] [address]    Culture  Setup Time   Final    GRAM NEGATIVE RODS AEROBIC BOTTLE Gram Stain Report Called to,Read Back By and Verified With: PARSONS,L AT 4401 ON 03/20/20 BY HUFFINES,S. Performed at [facility], [address] [place], [place] [address]    Culture (A)  Final    PSEUDOMONAS AERUGINOSA SUSCEPTIBILITIES PERFORMED ON PREVIOUS CULTURE WITHIN THE LAST 5 DAYS. Performed at [facility], [place] [address] [place], [place] [address]    Report Status 03/22/2020 FINAL  Final  Blood Culture ID Panel (Reflexed)     Status: Abnormal   Collection Time: 03/18/20  7:39 PM  Result Value Ref Range Status   Enterococcus faecalis NOT DETECTED NOT DETECTED Final   Enterococcus Faecium NOT DETECTED NOT DETECTED Final   Listeria monocytogenes NOT DETECTED NOT DETECTED Final   Staphylococcus species NOT DETECTED NOT DETECTED Final   Staphylococcus aureus (BCID) NOT DETECTED NOT DETECTED Final   Staphylococcus epidermidis NOT DETECTED NOT DETECTED Final   Staphylococcus lugdunensis NOT DETECTED NOT DETECTED Final   Streptococcus species NOT DETECTED NOT DETECTED Final   Streptococcus agalactiae NOT DETECTED NOT DETECTED Final   Streptococcus pneumoniae NOT DETECTED NOT DETECTED Final   Streptococcus pyogenes NOT DETECTED NOT DETECTED Final   A.calcoaceticus-baumannii NOT DETECTED NOT DETECTED Final   Bacteroides fragilis NOT DETECTED NOT DETECTED Final   Enterobacterales NOT DETECTED NOT DETECTED Final   Enterobacter cloacae complex NOT DETECTED NOT DETECTED Final   Escherichia coli NOT DETECTED NOT DETECTED Final   Klebsiella aerogenes NOT DETECTED NOT DETECTED Final   Klebsiella oxytoca NOT DETECTED NOT DETECTED Final   Klebsiella pneumoniae NOT DETECTED NOT DETECTED Final   Proteus species NOT DETECTED NOT DETECTED Final   Salmonella species NOT DETECTED NOT DETECTED Final   Serratia marcescens NOT DETECTED NOT  DETECTED Final   Haemophilus influenzae NOT DETECTED NOT DETECTED Final   Neisseria meningitidis NOT DETECTED NOT DETECTED Final   Pseudomonas aeruginosa DETECTED (A) NOT DETECTED Final    Comment: CRITICAL RESULT CALLED TO, READ BACK BY AND VERIFIED WITH: PHARM D J.MENTENHALL AT 1012 ON 03/20/2020 BY T.SAAD    Stenotrophomonas maltophilia NOT DETECTED NOT DETECTED Final   Candida albicans NOT DETECTED NOT DETECTED Final   Candida auris NOT DETECTED NOT DETECTED Final   Candida glabrata NOT DETECTED NOT DETECTED Final   Candida krusei NOT DETECTED NOT DETECTED Final   Candida parapsilosis NOT DETECTED NOT DETECTED Final   Candida tropicalis NOT DETECTED NOT DETECTED Final   Cryptococcus neoformans/gattii NOT DETECTED NOT DETECTED Final  CTX-M ESBL NOT DETECTED NOT DETECTED Final   Carbapenem resistance IMP NOT DETECTED NOT DETECTED Final   Carbapenem resistance KPC NOT DETECTED NOT DETECTED Final   Carbapenem resistance NDM NOT DETECTED NOT DETECTED Final   Carbapenem resistance VIM NOT DETECTED NOT DETECTED Final    Comment: Performed at [facility], [place] [address] [place], [place] [address]  Urine culture     Status: Abnormal   Collection Time: 03/19/20  7:10 AM   Specimen: In/Out Cath Urine  Result Value Ref Range Status   Specimen Description   Final    IN/OUT CATH URINE Performed at [facility], [address], [place], [place] [address]    Special Requests   Final    NONE Performed at [facility], [address] [place], [place] [address]    Culture MULTIPLE SPECIES PRESENT, SUGGEST RECOLLECTION (A)  Final   Report Status 03/20/2020 FINAL  Final         Radiology Studies: No results found.      Scheduled Meds: . sodium chloride   Intravenous Once  . Gerhardt's butt cream   Topical TID  . insulin aspart  0-9 Units Subcutaneous Q4H  . pantoprazole  40 mg Intravenous Q12H   Continuous Infusions: . sodium chloride Stopped (03/18/20 2326)  . dextrose  125 mL/hr at 03/22/20 1317  . meropenem (MERREM) IV 500 mg (03/22/20 1511)     LOS: 4 days        Hosie Poisson, MD [facility]   To contact the attending provider between 7A-7P or the covering provider during after hours 7P-7A, please log into the web site [email or web address] and access using universal [place] password for that web site. If you do not have the password, please call the hospital operator.  03/22/2020, 3:17 PM

## 2020-03-23 LAB — BASIC METABOLIC PANEL
Anion gap: 8 (ref 5–15)
BUN: 30 mg/dL — ABNORMAL HIGH (ref 8–23)
CO2: 21 mmol/L — ABNORMAL LOW (ref 22–32)
Calcium: 8.1 mg/dL — ABNORMAL LOW (ref 8.9–10.3)
Chloride: 119 mmol/L — ABNORMAL HIGH (ref 98–111)
Creatinine, Ser: 1.78 mg/dL — ABNORMAL HIGH (ref 0.61–1.24)
GFR, Estimated: 36 mL/min — ABNORMAL LOW (ref >60)
Glucose, Bld: 153 mg/dL — ABNORMAL HIGH (ref 70–99)
Potassium: 3.2 mmol/L — ABNORMAL LOW (ref 3.5–5.1)
Sodium: 148 mmol/L — ABNORMAL HIGH (ref 135–145)

## 2020-03-23 LAB — CULTURE, BLOOD (ROUTINE X 2): Special Requests: ADEQUATE

## 2020-03-23 LAB — GLUCOSE, CAPILLARY
Glucose-Capillary: 125 mg/dL — ABNORMAL HIGH (ref 70–99)
Glucose-Capillary: 160 mg/dL — ABNORMAL HIGH (ref 70–99)
Glucose-Capillary: 165 mg/dL — ABNORMAL HIGH (ref 70–99)
Glucose-Capillary: 174 mg/dL — ABNORMAL HIGH (ref 70–99)
Glucose-Capillary: 220 mg/dL — ABNORMAL HIGH (ref 70–99)
Glucose-Capillary: 221 mg/dL — ABNORMAL HIGH (ref 70–99)

## 2020-03-23 LAB — HEMOGLOBIN AND HEMATOCRIT, BLOOD
HCT: 32 % — ABNORMAL LOW (ref 39.0–52.0)
Hemoglobin: 9.6 g/dL — ABNORMAL LOW (ref 13.0–17.0)

## 2020-03-23 MED ORDER — INSULIN GLARGINE 100 UNIT/ML ~~LOC~~ SOLN
5.0000 [IU] | Freq: Every day | SUBCUTANEOUS | Status: DC
  Administered 2020-03-23: 5 [IU] via SUBCUTANEOUS
  Filled 2020-03-23 (×2): qty 0.05

## 2020-03-23 MED ORDER — POTASSIUM CHLORIDE 10 MEQ/100ML IV SOLN
10.0000 meq | INTRAVENOUS | Status: AC
  Administered 2020-03-23 (×2): 10 meq via INTRAVENOUS
  Filled 2020-03-23: qty 100

## 2020-03-23 NOTE — Progress Notes (Signed)
Reviewed patient chart. Pseudomonas sepsis with AMS and remote history of UGI bleed in 2004. Noted profound anemia and heme+ stool with no overt GI bleed. Hgb declined from 7.7 to 6.7. Given 2 units PRBC and hemoglobin has remained stable.  Hemoglobin up to 9.6 today.   Spoke with nurse who reports no further rectal bleeding.   Per discussion with hospitalists, she has recommended palliative care consult. States family is still wanting to think about whether or not they would want patient to have any sort of endoscopic evaluation in the future once he improves.   We have been following peripherally. As hemoglobin has improved and he has had not further GI bleeding, will sign off. He should continue PPI BID. Patient is going to need to recover from acute illness prior to consideration of endoscopic evaluation. We can follow-up with patient in the clinic for consideration of EGD/TCS if family desires.  Aliene Altes, PA-C [facility]

## 2020-03-23 NOTE — Progress Notes (Signed)
PROGRESS NOTE    Paul Perez  IRC:[id number] DOB: 1928/02/10 DOA: 03/18/2020 PCP: [facility]    Chief Complaint  Patient presents with  . Hyperglycemia    Brief Narrative:  86 year old male prior history of type 2 diabetes, hypertension, stage IV CKD, dementia, GI bleed presents to ED from [facility] for confusion and fever and elevated blood sugars.  Patient unable to provide any history due to underlying dementia.  Patient was found to have right sided pneumonia/ infrahilar pneumonia and urinary tract infection and admitted for severe sepsis. He was also found to be hypernatremic, AKI,  anemic and stool for occult blood is positive. GI consulted , recommended transfusions as needed and conservative management.  In view of his severe sepsis, advanced dementia, clinical deterioration, dysphagia, poor oral intake, hyper natremia not responding to IV treatments, AKI, palliative care consulted for goals of care discussion. He was found to have Pseudomonas bacteremia and was started on broad-spectrum IV antibiotics. His mental status improved with improvement in sodium.  Seen and examined at bedside.  He appears to be more alert today,  opening eyes to verbal cues. He does not appear to be in any kind of distress. Recommend to continue with IV antibiotics to complete a 14 day course of IV antibiotics for bacteremia. Meanwhile palliative care on board to assist with goals of care .        Assessment & Plan:   Principal Problem:   Severe sepsis ([place]) Active Problems:   Hypertension   Hypernatremia   Diabetes mellitus type 2, uncontrolled, with complications (HCC)   Dehydration   CKD (chronic kidney disease) stage 4, GFR 15-29 ml/min (HCC)   Acute febrile illness   Hyperosmolar hyperglycemic state (HHS) (HCC)   GI bleed   Sacral decubitus ulcer, stage II ([place])   Abrasion of left leg   Scrotal skin lesion   Hypoalbuminemia   Acute metabolic encephalopathy    Community acquired pneumonia     Acute metabolic encephalopathy: Superimposed on baseline dementia , probably secondary to severe sepsis from UTI, Pneumonia and Pseudomonas bacteremia Currently NPO and SLP eval ordered, couldn't be evaluated due to his altered mental status.  Pt on admission, was febrile, tachypneic RR >25/min, with AKI, lactic acid of 6.6, was found to have pneumonia and UTI, Pseudomonas bacteremia.  Sepsis physiology improving.  Patient is afebrile, heart rate has improved respiratory rate has improved. He was started initially on IV cefepime and transition to IV meropenem as his blood cultures grew Clostridium and Pseudomonas.  Repeat blood cultures ordered plan to wean him off the oxygen in the next 24 hours. Urine cultures show multiple bacteria.     Pseudomonas bacteremia Continue with IV meropenem.   Essential hypertension Pressure parameters are slightly elevated, as needed hydralazine will be added to his regimen.  Hyperglycemia sec to poorly controlled type 2 DM:  He was started on IV insulin and transitioned to SSI.  CBG (last 3)  Recent Labs    03/23/20 0350 03/23/20 0757 03/23/20 1158  GLUCAP 160* 125* 165*   Continue with sliding scale insulin.  Acute anemia of blood loss possibly GI source.  Hemoccult is positive.  He underwent 2 units of PRBC transfusion hemoglobin has been stable around 9 posttransfusion GI consulted and recommendations given, plan for conservative management. Plan for upper endoscopy when patient is more stable, if family wants aggressive measures.  Continue with IV PPI BID.    Acute kidney injury on Stage IV  CKD,  Monitor renal parameters while on IV fluids.  Creatinine has improved from 3.7-1.7 today Avoid nephrotoxic agents/ dehydration/ hypotension.  Continue with IV fluid resuscitation.    Stage 2 sacral decubitus ulcer:  Wound care consulted.  Pressure Injury 03/19/20 Sacrum (Active)  03/19/20 1600  Location:  [place]  Location Orientation:   Staging:   Wound Description (Comments):   Present on Admission:     Hypernatremia:  From free water deficit about 7 lit approximately. Secondary to poor oral intake and dehydration from advanced dementia Sodium has improved from 1 62-1 48 today    Hypokalemia Replaced  Oropharyngeal dysphagia:  Repeat SLP evaluation ordered to see if he will be able to take by mouth   Hypoalbuminemia sec to mod protein calorie mod Dietary consulted.    In view of his multiple medical problems, declining health, severe sepsis, dementia, dysphagia, hypernatremia, palliative care consulted for goals of care.      DVT prophylaxis: SCD'S Code Status:DNR.  Family Communication: discussed with son over the phone.  On 03/22/2020 Disposition:   Status is: Inpatient  Remains inpatient appropriate because:Ongoing diagnostic testing needed not appropriate for outpatient work up, IV treatments appropriate due to intensity of illness or inability to take PO and Inpatient level of care appropriate due to severity of illness   Dispo: The patient is from: SNF              Anticipated d/c is to: SNF              Anticipated d/c date is: 2 days              Patient currently is not medically stable to d/c.       Consultants:   Palliative care  Gastroenterology.   Procedures: none.   Antimicrobials:  Antibiotics Given (last 72 hours)    Date/Time Action Medication Dose Rate   03/20/20 1927 New Bag/Given   metroNIDAZOLE (FLAGYL) IVPB 500 mg 500 mg 100 mL/hr   03/20/20 2100 New Bag/Given   ceFEPIme (MAXIPIME) 2 g in sodium chloride 0.9 % 100 mL IVPB 2 g 200 mL/hr   03/21/20 0305 New Bag/Given   metroNIDAZOLE (FLAGYL) IVPB 500 mg 500 mg 100 mL/hr   03/21/20 1135 New Bag/Given   metroNIDAZOLE (FLAGYL) IVPB 500 mg 500 mg 100 mL/hr   03/21/20 1710 New Bag/Given   meropenem (MERREM) 500 mg in sodium chloride 0.9 % 100 mL IVPB 500 mg 200 mL/hr   03/22/20 0441  New Bag/Given   meropenem (MERREM) 500 mg in sodium chloride 0.9 % 100 mL IVPB 500 mg 200 mL/hr   03/22/20 1511 New Bag/Given   meropenem (MERREM) 500 mg in sodium chloride 0.9 % 100 mL IVPB 500 mg 200 mL/hr   03/23/20 0414 New Bag/Given   meropenem (MERREM) 500 mg in sodium chloride 0.9 % 100 mL IVPB 500 mg 200 mL/hr         Subjective: Patient is more calm and alert today does not appear to be in distress  Objective: Vitals:   03/23/20 0344 03/23/20 0522 03/23/20 0908 03/23/20 1229  BP: (!) 165/74 (!) 171/76 (!) 170/85 (!) 165/77  Pulse: 67 70 79 81  Resp: 20 20 19 19   Temp: 98.1 F (36.7 C) 97.7 F (36.5 C) 97.6 F (36.4 C) 97.6 F (36.4 C)  TempSrc: Oral  Oral Oral  SpO2: 98% 100% 100% 100%  Weight:      Height:  Intake/Output Summary (Last 24 hours) at 03/23/2020 1324 Last data filed at 03/23/2020 0500 Gross per 24 hour  Intake 2688.4 ml  Output 550 ml  Net 2138.4 ml   Filed Weights   03/18/20 1834  Weight: 83 kg    Examination:  General exam: Chronically ill-appearing gentleman on 2 L of nasal cannula oxygen, appears comfortable respiratory system: Scattered rhonchi, no wheezing , air entry fair, no tachypnea Cardiovascular system: S1-S2 heard, regular rate rhythm, no JVD, no pedal edema Gastrointestinal system: Abdomen i is soft, nontender bowel sounds normal Central nervous system: Patient is more alert, opening eyes to verbal cues. Extremities: No pedal edema Skin: Sacral decubitus ulcer Psychiatry: Cannot be assessed due to advanced dementia    Data Reviewed: I have personally reviewed following labs and imaging studies  CBC: Recent Labs  Lab 03/18/20 1858 03/18/20 1858 03/19/20 0419 03/19/20 1259 03/20/20 0923 03/20/20 1447 03/21/20 0608 03/21/20 1233 03/21/20 2216 03/22/20 1102 03/23/20 0731  WBC 10.3  --  11.1*  --  8.0  --   --   --   --  7.4  --   NEUTROABS 8.5*  --   --   --   --   --   --   --   --   --   --   HGB 6.8*    < > 6.7*   < > 9.2*   < > 9.7* 8.7* 8.7* 9.2*  9.2* 9.6*  HCT 24.0*   < > 23.5*   < > 30.6*   < > 33.3* 29.5* 29.6* 31.3*  30.8* 32.0*  MCV 92.3  --  95.9  --  93.3  --   --   --   --  93.7  --   PLT 342  --  257  --  218  --   --   --   --  157  --    < > = values in this interval not displayed.    Basic Metabolic Panel: Recent Labs  Lab 03/19/20 0419 03/19/20 0909 03/21/20 1454 03/21/20 2217 03/22/20 0605 03/22/20 1414 03/22/20 1600 03/23/20 0731  NA 163*   < > 156* 152* 152* 149*  --  148*  K 4.7   < > 3.2* 3.3* 3.4* 3.0*  --  3.2*  CL 129*   < > 129* 126* 124* 123*  --  119*  CO2 22   < > 20* 17* 20* 20*  --  21*  GLUCOSE 221*   < > 226* 312* 208* 214*  --  153*  BUN 76*   < > 47* 45* 39* 36*  --  30*  CREATININE 2.70*   < > 2.16* 2.23* 2.11* 2.03*  --  1.78*  CALCIUM 8.4*   < > 7.9* 7.9* 8.0* 7.8*  --  8.1*  MG 2.5*  --   --   --   --   --  2.4  --   PHOS 3.1  --   --   --   --   --   --   --    < > = values in this interval not displayed.    GFR: Estimated Creatinine Clearance: 30.3 mL/min (A) (by C-G formula based on SCr of 1.78 mg/dL (H)).  Liver Function Tests: Recent Labs  Lab 03/18/20 1858 03/19/20 0419  AST 46* 35  ALT 33 29  ALKPHOS 71 58  BILITOT 0.6 0.8  PROT 6.3* 5.5*  ALBUMIN 2.2* 2.0*  CBG: Recent Labs  Lab 03/22/20 2038 03/23/20 0032 03/23/20 0350 03/23/20 0757 03/23/20 1158  GLUCAP 206* 220* 160* 125* 165*     Recent Results (from the past 240 hour(s))  Respiratory Panel by RT PCR (Flu A&B, Covid) - Nasopharyngeal Swab     Status: None   Collection Time: 03/18/20  6:58 PM   Specimen: Nasopharyngeal Swab  Result Value Ref Range Status   SARS Coronavirus 2 by RT PCR NEGATIVE NEGATIVE Final    Comment: (NOTE) SARS-CoV-2 target nucleic acids are NOT DETECTED.  The SARS-CoV-2 RNA is generally detectable in upper respiratoy specimens during the acute phase of infection. The lowest concentration of SARS-CoV-2 viral copies this  assay can detect is 131 copies/mL. A negative result does not preclude SARS-Cov-2 infection and should not be used as the sole basis for treatment or other patient management decisions. A negative result may occur with  improper specimen collection/handling, submission of specimen other than nasopharyngeal swab, presence of viral mutation(s) within the areas targeted by this assay, and inadequate number of viral copies (<131 copies/mL). A negative result must be combined with clinical observations, patient history, and epidemiological information. The expected result is Negative.  Fact Sheet for Patients:  PinkCheek.be  Fact Sheet for Healthcare Providers:  GravelBags.it  This test is no t yet approved or cleared by the [place] FDA and  has been authorized for detection and/or diagnosis of SARS-CoV-2 by FDA under an Emergency Use Authorization (EUA). This EUA will remain  in effect (meaning this test can be used) for the duration of the COVID-19 declaration under Section 564(b)(1) of the Act, 21 U.S.C. section 360bbb-3(b)(1), unless the authorization is terminated or revoked sooner.     Influenza A by PCR NEGATIVE NEGATIVE Final   Influenza B by PCR NEGATIVE NEGATIVE Final    Comment: (NOTE) The Xpert Xpress SARS-CoV-2/FLU/RSV assay is intended as an aid in  the diagnosis of influenza from Nasopharyngeal swab specimens and  should not be used as a sole basis for treatment. Nasal washings and  aspirates are unacceptable for Xpert Xpress SARS-CoV-2/FLU/RSV  testing.  Fact Sheet for Patients: PinkCheek.be  Fact Sheet for Healthcare Providers: GravelBags.it  This test is not yet approved or cleared by the [place] FDA and  has been authorized for detection and/or diagnosis of SARS-CoV-2 by  FDA under an Emergency Use Authorization (EUA). This EUA will  remain  in effect (meaning this test can be used) for the duration of the  Covid-19 declaration under Section 564(b)(1) of the Act, 21  U.S.C. section 360bbb-3(b)(1), unless the authorization is  terminated or revoked. Performed at [facility], [address], [place], [place] [address]   Culture, blood (Routine x 2)     Status: Abnormal   Collection Time: 03/18/20  7:39 PM   Specimen: BLOOD RIGHT ARM  Result Value Ref Range Status   Specimen Description BLOOD RIGHT ARM  Final   Special Requests   Final    BOTTLES DRAWN AEROBIC AND ANAEROBIC Blood Culture adequate volume   Culture  Setup Time   Final    GRAM POSITIVE RODS Gram Stain Report Called to,Read Back By and Verified With: KATIE SITES @1257  03/19/20 BY JONES,T  ANAEROBIC BOTTLE ONLY [facility] GRAM NEGATIVE RODS AEROBIC BOTTLE ONLY CRITICAL RESULT CALLED TO, READ BACK BY AND VERIFIED WITH: PHARMD J.MENTENHALL AT 1012 ON 03/20/2020 BY T.SAAD    Culture CLOSTRIDIUM RAMOSUM PSEUDOMONAS AERUGINOSA  (A)  Final   Report Status 03/22/2020 FINAL  Final   Organism ID, Bacteria PSEUDOMONAS AERUGINOSA  Final      Susceptibility   Pseudomonas aeruginosa - MIC*    CEFTAZIDIME 4 SENSITIVE Sensitive     CIPROFLOXACIN <=0.25 SENSITIVE Sensitive     GENTAMICIN <=1 SENSITIVE Sensitive     IMIPENEM 2 SENSITIVE Sensitive     PIP/TAZO 16 SENSITIVE Sensitive     CEFEPIME Value in next row Sensitive      SENSITIVEPerformed at [place] [address] [place], [place] [address]    * PSEUDOMONAS AERUGINOSA  Culture, blood (Routine x 2)     Status: Abnormal   Collection Time: 03/18/20  7:39 PM   Specimen: BLOOD RIGHT ARM  Result Value Ref Range Status   Specimen Description   Final    BLOOD RIGHT ARM Performed at [facility], [address] [place], [place] [address]    Special Requests   Final    BOTTLES DRAWN AEROBIC AND ANAEROBIC Blood Culture adequate volume Performed at [facility], [address] [place], [place] [address]      Culture  Setup Time   Final    GRAM NEGATIVE RODS AEROBIC BOTTLE Gram Stain Report Called to,Read Back By and Verified With: PARSONS,L AT 8242 ON 03/20/20 BY HUFFINES,S. Performed at [facility], [address] [place], [place] [address]    Culture (A)  Final    PSEUDOMONAS AERUGINOSA SUSCEPTIBILITIES PERFORMED ON PREVIOUS CULTURE WITHIN THE LAST 5 DAYS. Performed at [facility], [place] [address], [place], [place] [address]    Report Status 03/22/2020 FINAL  Final  Blood Culture ID Panel (Reflexed)     Status: Abnormal   Collection Time: 03/18/20  7:39 PM  Result Value Ref Range Status   Enterococcus faecalis NOT DETECTED NOT DETECTED Final   Enterococcus Faecium NOT DETECTED NOT DETECTED Final   Listeria monocytogenes NOT DETECTED NOT DETECTED Final   Staphylococcus species NOT DETECTED NOT DETECTED Final   Staphylococcus aureus (BCID) NOT DETECTED NOT DETECTED Final   Staphylococcus epidermidis NOT DETECTED NOT DETECTED Final   Staphylococcus lugdunensis NOT DETECTED NOT DETECTED Final   Streptococcus species NOT DETECTED NOT DETECTED Final   Streptococcus agalactiae NOT DETECTED NOT DETECTED Final   Streptococcus pneumoniae NOT DETECTED NOT DETECTED Final   Streptococcus pyogenes NOT DETECTED NOT DETECTED Final   A.calcoaceticus-baumannii NOT DETECTED NOT DETECTED Final   Bacteroides fragilis NOT DETECTED NOT DETECTED Final   Enterobacterales NOT DETECTED NOT DETECTED Final   Enterobacter cloacae complex NOT DETECTED NOT DETECTED Final   Escherichia coli NOT DETECTED NOT DETECTED Final   Klebsiella aerogenes NOT DETECTED NOT DETECTED Final   Klebsiella oxytoca NOT DETECTED NOT DETECTED Final   Klebsiella pneumoniae NOT DETECTED NOT DETECTED Final   Proteus species NOT DETECTED NOT DETECTED Final   Salmonella species NOT DETECTED NOT DETECTED Final   Serratia marcescens NOT DETECTED NOT DETECTED Final   Haemophilus influenzae NOT DETECTED NOT DETECTED Final   Neisseria  meningitidis NOT DETECTED NOT DETECTED Final   Pseudomonas aeruginosa DETECTED (A) NOT DETECTED Final    Comment: CRITICAL RESULT CALLED TO, READ BACK BY AND VERIFIED WITH: PHARM D J.MENTENHALL AT 1012 ON 03/20/2020 BY T.SAAD    Stenotrophomonas maltophilia NOT DETECTED NOT DETECTED Final   Candida albicans NOT DETECTED NOT DETECTED Final   Candida auris NOT DETECTED NOT DETECTED Final   Candida glabrata NOT DETECTED NOT DETECTED Final   Candida krusei NOT DETECTED NOT DETECTED Final   Candida parapsilosis NOT  DETECTED NOT DETECTED Final   Candida tropicalis NOT DETECTED NOT DETECTED Final   Cryptococcus neoformans/gattii NOT DETECTED NOT DETECTED Final   CTX-M ESBL NOT DETECTED NOT DETECTED Final   Carbapenem resistance IMP NOT DETECTED NOT DETECTED Final   Carbapenem resistance KPC NOT DETECTED NOT DETECTED Final   Carbapenem resistance NDM NOT DETECTED NOT DETECTED Final   Carbapenem resistance VIM NOT DETECTED NOT DETECTED Final    Comment: Performed at [facility], [place] [address], [place], [place] [address]  Urine culture     Status: Abnormal   Collection Time: 03/19/20  7:10 AM   Specimen: In/Out Cath Urine  Result Value Ref Range Status   Specimen Description   Final    IN/OUT CATH URINE Performed at [facility], [address], [place],  [address]    Special Requests   Final    NONE Performed at [facility], [address] [place],  [address]    Culture MULTIPLE SPECIES PRESENT, SUGGEST RECOLLECTION (A)  Final   Report Status 03/20/2020 FINAL  Final         Radiology Studies: No results found.      Scheduled Meds: . sodium chloride   Intravenous Once  . Gerhardt's butt cream   Topical TID  . insulin aspart  0-9 Units Subcutaneous Q4H  . insulin glargine  5 Units Subcutaneous QHS  . pantoprazole  40 mg Intravenous Q12H   Continuous Infusions: . sodium chloride Stopped (03/18/20 2326)  . dextrose 75 mL/hr at 03/23/20 1218  .  meropenem (MERREM) IV 500 mg (03/23/20 0414)     LOS: 5 days        Hosie Poisson, MD [facility]   To contact the attending provider between 7A-7P or the covering provider during after hours 7P-7A, please log into the web site [email or web address] and access using universal [place] password for that web site. If you do not have the password, please call the hospital operator.  03/23/2020, 1:24 PM

## 2020-03-23 NOTE — Evaluation (Signed)
Clinical/Bedside Swallow Evaluation Patient Details  Name: Paul Perez MRN: [id number] Date of Birth: April 21, 1928  Today's Date: 03/23/2020 Time: SLP Start Time (ACUTE ONLY): 1440 SLP Stop Time (ACUTE ONLY): 1507 SLP Time Calculation (min) (ACUTE ONLY): 27 min  Past Medical History:  Past Medical History:  Diagnosis Date  . Dementia ([place])   . Dementia ([place])   . Diabetes mellitus without complication ([place])   . Dysphagia   . Encephalopathy   . Gastritis 2004  . GIB (gastrointestinal bleeding) 2004  . Hypertension   . Poor historian   . Renal disorder   . Sinus bradycardia seen on cardiac monitor    Past Surgical History:  Past Surgical History:  Procedure Laterality Date  . INGUINAL HERNIA REPAIR  09/2010   Bilateral  . UPPER GASTROINTESTINAL ENDOSCOPY  2004   [place]: Duodenal/bulbar ulcer with arterial spurting.  Status post therapeutic measures.  Gastritis with duodenitis.  Bulbar pseudodiverticulum.   HPI:  Paul Perez is a 86 y.o. male with medical history significant for dementia, T2 DM,Hypertension, CKD stage IV, premature atrial contractions  and history of GI bleed who presents  to the emergency department from [facility] with elevated blood glucose level, confusion and fever.  Patient was unable to provide history possible due to underlying dementia and chronic condition, history was obtained from ED physician assistant and ED medical record.  Per report, patient was noted to be febrile with a temperature of 101F, his blood glucose level was noted to be over 500, he was noted to be confused (patient was able to respond to yes/no answers at baseline), so he was sent to the ED for further evaluation and management.  Patient was recently admitted on 10/9 and discharged on 10/22 due to severe sepsis secondary to Pseudomonas bacteremia (treated with 7 days of IV cefepime with unremarkable repeated blood culture). BSE requested. Pt known to SLP service from previous  admission and MBSS with recommendation for NTL.    Assessment / Plan / Recommendation Clinical Impression  Pt known to SLP service from previous admission last month and MBSS completed at that time with recommendation for NTL due to Pt with trace, silent aspiration of thins. He was discharged from [facility] on D3/mech soft and NTL. Pt's heart rate elevated during trials of thin during last admission. Pt more alert and calm this date and able to participate in swallow evaluation. Pt assessed with ice chips, NTL, and puree. Trials of thin were deferred given objective assessment last month. Pt without overt signs or symptoms of aspiration with above consistencies. Recommend D1/puree and NTL with po medications whole in puree with supervision and assistance for all po at this time. SLP will follow during acute stay to see if Pt can be upgraded for textures. He will likely need to stay on NTL given previous silent aspiration and heart rate issues, however can be liberalized if Pt moves to hospice. Above to RN.   SLP Visit Diagnosis: Dysphagia, oropharyngeal phase (R13.12)    Aspiration Risk  Mild aspiration risk;Moderate aspiration risk    Diet Recommendation Dysphagia 1 (Puree);Nectar-thick liquid   Liquid Administration via: Cup;Spoon Medication Administration: Whole meds with puree Supervision: Staff to assist with self feeding Compensations: Slow rate;Small sips/bites Postural Changes: Seated upright at 90 degrees;Remain upright for at least 30 minutes after po intake    Other  Recommendations Oral Care Recommendations: Oral care BID;Staff/trained caregiver to provide oral care Other Recommendations: Order thickener from pharmacy   Follow  up Recommendations 24 hour supervision/assistance      Frequency and Duration min 2x/week  1 week       Prognosis Prognosis for Safe Diet Advancement: Fair Barriers to Reach Goals: Cognitive deficits      Swallow Study   General Date of Onset:  03/18/20 HPI: Paul Perez is a 86 y.o. male with medical history significant for dementia, T2 DM,Hypertension, CKD stage IV, premature atrial contractions  and history of GI bleed who presents  to the emergency department from [facility] with elevated blood glucose level, confusion and fever.  Patient was unable to provide history possible due to underlying dementia and chronic condition, history was obtained from ED physician assistant and ED medical record.  Per report, patient was noted to be febrile with a temperature of 101F, his blood glucose level was noted to be over 500, he was noted to be confused (patient was able to respond to yes/no answers at baseline), so he was sent to the ED for further evaluation and management.  Patient was recently admitted on 10/9 and discharged on 10/22 due to severe sepsis secondary to Pseudomonas bacteremia (treated with 7 days of IV cefepime with unremarkable repeated blood culture). BSE requested. Pt known to SLP service from previous admission and MBSS with recommendation for NTL.  Type of Study: Bedside Swallow Evaluation Previous Swallow Assessment: MBSS Oct 2021 NTL Diet Prior to this Study: NPO Temperature Spikes Noted: No Respiratory Status: Nasal cannula History of Recent Intubation: No Behavior/Cognition: Alert;Cooperative;Requires cueing Oral Cavity Assessment: Dried secretions Oral Care Completed by SLP: Yes Oral Cavity - Dentition: Adequate natural dentition;Missing dentition Vision: Impaired for self-feeding Self-Feeding Abilities: Total assist Patient Positioning: Upright in bed Baseline Vocal Quality: Normal Volitional Cough: Weak Volitional Swallow: Able to elicit    Oral/Motor/Sensory Function Overall Oral Motor/Sensory Function: Within functional limits   Ice Chips Ice chips: Within functional limits Presentation: Spoon   Thin Liquid Thin Liquid: Not tested    Nectar Thick Nectar Thick Liquid: Within functional  limits Presentation: Cup;Spoon   Honey Thick Honey Thick Liquid: Not tested   Puree Puree: Within functional limits Presentation: Spoon   Solid     Solid: Not tested     Thank you,  Genene Churn, [place]  Jiana Lemaire 03/23/2020,3:24 PM

## 2020-03-23 NOTE — Evaluation (Addendum)
Physical Therapy Evaluation Patient Details Name: Paul Perez MRN: [id number] DOB: April 02, 1928 Today's Date: 03/23/2020   History of Present Illness  Paul Perez is a 86 y.o. male with medical history significant for dementia, T2 DM,Hypertension, CKD stage IV, premature atrial contractions  and history of GI bleed who presents  to the emergency department from [facility] with elevated blood glucose level, confusion and fever.  Patient was unable to provide history possible due to underlying dementia and chronic condition, history was obtained from ED physician assistant and ED medical record.  Per report, patient was noted to be febrile with a temperature of 101F, his blood glucose level was noted to be over 500, he was noted to be confused (patient was able to respond to yes/no answers at baseline), so he was sent to the ED for further evaluation and management.  Patient was recently admitted on 10/9 and discharged on 10/22 due to severe sepsis secondary to Pseudomonas bacteremia (treated with 7 days of IV cefepime with unremarkable repeated blood culture)    Clinical Impression  Patient presents with limited bilateral knee flexion, only able to flex knees approximately 30-40 degrees before limited due to stiffness and increased pain, Max assist to sit up at bedside, had to support self with BUE to maintain sitting balance and unable to stand due to weakness/BLE knee stiffness.  Patient put back to bed with Max/total assist to reposition.  Patient will benefit from continued physical therapy in hospital and recommended venue below to increase strength, balance, endurance for safe ADLs and transfers.    Follow Up Recommendations SNF    Equipment Recommendations  None recommended by PT    Recommendations for Other Services       Precautions / Restrictions Precautions Precautions: Fall Restrictions Weight Bearing Restrictions: No      Mobility  Bed Mobility Overal bed mobility:  Needs Assistance Bed Mobility: Supine to Sit;Sit to Supine;Rolling Rolling: Mod assist;Max assist   Supine to sit: Max assist Sit to supine: Max assist   General bed mobility comments: labored movement, increased time    Transfers                    Ambulation/Gait                Stairs            Wheelchair Mobility    Modified Rankin (Stroke Patients Only)       Balance Overall balance assessment: Needs assistance Sitting-balance support: Feet supported;No upper extremity supported Sitting balance-Leahy Scale: Poor Sitting balance - Comments: fair/poor seated at EOB Postural control: Posterior lean                                   Pertinent Vitals/Pain Pain Assessment: Faces Faces Pain Scale: Hurts little more Pain Location: BLE with pressure Pain Descriptors / Indicators: Grimacing;Guarding Pain Intervention(s): Limited activity within patient's tolerance;Monitored during session;Repositioned    Home Living Family/patient expects to be discharged to:: Assisted living               Home Equipment: Bedside commode;Cane - single point;Walker - 2 wheels;Shower seat;Wheelchair - manual      Prior Function Level of Independence: Needs assistance   Gait / Transfers Assistance Needed: patient unable to state due to poor historian  ADL's / Homemaking Assistance Needed: assisted by ALF staff  Hand Dominance   Dominant Hand: Right    Extremity/Trunk Assessment   Upper Extremity Assessment Upper Extremity Assessment: Generalized weakness    Lower Extremity Assessment Lower Extremity Assessment: Generalized weakness    Cervical / Trunk Assessment Cervical / Trunk Assessment: Normal  Communication   Communication: Expressive difficulties  Cognition Arousal/Alertness: Awake/alert Behavior During Therapy: Flat affect Overall Cognitive Status: History of cognitive impairments - at baseline                                         General Comments      Exercises     Assessment/Plan    PT Assessment Patient needs continued PT services  PT Problem List Decreased strength;Decreased activity tolerance;Decreased balance;Decreased mobility       PT Treatment Interventions Gait training;Functional mobility training;Therapeutic activities;Therapeutic exercise;Balance training;Patient/family education;Stair training    PT Goals (Current goals can be found in the Care Plan section)  Acute Rehab PT Goals Patient Stated Goal: return hom PT Goal Formulation: With patient Time For Goal Achievement: 04/06/20 Potential to Achieve Goals: Fair    Frequency Min 2X/week   Barriers to discharge        Co-evaluation               AM-PAC PT "6 Clicks" Mobility  Outcome Measure Help needed turning from your back to your side while in a flat bed without using bedrails?: A Lot Help needed moving from lying on your back to sitting on the side of a flat bed without using bedrails?: A Lot Help needed moving to and from a bed to a chair (including a wheelchair)?: Total Help needed standing up from a chair using your arms (Paul.g., wheelchair or bedside chair)?: Total Help needed to walk in hospital room?: Total Help needed climbing 3-5 steps with a railing? : Total 6 Click Score: 8    End of Session   Activity Tolerance: Patient tolerated treatment well;Patient limited by fatigue Patient left: in bed;with call bell/phone within reach Nurse Communication: Mobility status PT Visit Diagnosis: Unsteadiness on feet (R26.81);Other abnormalities of gait and mobility (R26.89);Muscle weakness (generalized) (M62.81)    Time: 0350-0938 PT Time Calculation (min) (ACUTE ONLY): 17 min   Charges:   PT Evaluation $PT Eval Low Complexity: 1 Low PT Treatments $Therapeutic Activity: 8-22 mins        9:40 AM, 03/23/20 Lonell Grandchild, MPT Physical Therapist with [facility] [phone] office [phone] mobile phone

## 2020-03-23 NOTE — Plan of Care (Signed)
  Problem: Acute Rehab PT Goals(only PT should resolve) Goal: Pt will Roll Supine to Side Outcome: Progressing Flowsheets (Taken 03/23/2020 0941) Pt will Roll Supine to Side:  with min assist  with mod assist Goal: Pt Will Go Supine/Side To Sit Outcome: Progressing Flowsheets (Taken 03/23/2020 0941) Pt will go Supine/Side to Sit:  with moderate assist  with maximum assist Goal: Pt Will Go Sit To Supine/Side Outcome: Progressing Flowsheets (Taken 03/23/2020 0941) Pt will go Sit to Supine/Side:  with moderate assist  with maximum assist Goal: Patient Will Perform Sitting Balance Outcome: Progressing Flowsheets (Taken 03/23/2020 0941) Patient will perform sitting balance:  with supervision  with min guard assist Goal: Patient Will Transfer Sit To/From Stand Outcome: Progressing [place] (Taken 03/23/2020 0941) Patient will transfer sit to/from stand: with maximum assist Goal: Pt Will Transfer Bed To Chair/Chair To Bed Outcome: Progressing Flowsheets (Taken 03/23/2020 0941) Pt will Transfer Bed to Chair/Chair to Bed: with max assist   9:41 AM, 03/23/20 Lonell Grandchild, MPT Physical Therapist with [facility] [phone] office [phone] mobile phone

## 2020-03-23 NOTE — Progress Notes (Addendum)
Inpatient Diabetes Program Recommendations  AACE/ADA: New Consensus Statement on Inpatient Glycemic Control   Target Ranges:  Prepandial:   less than 140 mg/dL      Peak postprandial:   less than 180 mg/dL (1-2 hours)      Critically ill patients:  140 - 180 mg/dL  Results for Paul Perez, Paul "ED" (MRN [id number]) as of 03/23/2020 10:29  Ref. Range 03/22/2020 07:28 03/22/2020 11:19 03/22/2020 16:43 03/22/2020 20:38 03/23/2020 00:32 03/23/2020 03:50 03/23/2020 07:57  Glucose-Capillary Latest Ref Range: 70 - 99 mg/dL 193 (H)  Novolog 2 units 190 (H)  Novolog 2 units 202 (H)  Novolog 3 units 206 (H)  Novolog 3 units 220 (H)  Novolog 3 units 160 (H)  Novolog 2 units 125 (H)  Novolog 1 units   Results for Paul Perez, Paul "ED" (MRN [id number]) as of 03/23/2020 10:29  Ref. Range 03/18/2020 18:58  Glucose Latest Ref Range: 70 - 99 mg/dL [facility])   Results for Paul Perez, Paul "ED" (MRN [id number]) as of 03/23/2020 10:29  Ref. Range 02/22/2020 13:43  Hemoglobin A1C Latest Ref Range: 4.8 - 5.6 % >15.5 (H)   Review of Glycemic Control  Diabetes history: DM2 Outpatient Diabetes medications: Actos 15 mg daily Current orders for Inpatient glycemic control: Novolog 0-9 units Q4H  Inpatient Diabetes Program Recommendations:    Insulin: If appropriate, please consider ordering Lantus 7 units Q24H.  NOTE: Patient admitted from [facility] SNF with initial glucose of 678 mg/dl. In reviewing chart, noted patient was inpatient 02/22/20-03/06/20, was last ordered Levemir 20 units daily, Novolog 0-9 units TID with meals and Novolog 5 units TID with meals for meal coverage while inpatient (per progress note on 03/05/20 by Dr. Verlon Au). When patient was discharged on 03/06/20, patient was discharged only on Actos 15 mg daily (as previously taken outpatient). Inpatient diabetes coordinator spoke with patient's son on 02/24/20 regarding A1C and likely need to take insulin outpatient.  With A1C >15.5% on 02/22/20  and glucose ov 678 mg/dl on 03/18/20, anticipate patient will need to be discharged on insulin regimen for DM management if appropriate (noted family consider hospice per note on 03/19/20 by M. Cornelia Copa, DNP, FNP-C with Palliative Medicine).   Thanks, Barnie Alderman, RN, MSN, CDE Diabetes Coordinator Inpatient Diabetes Program [phone] (Team Pager from 8am to 5pm)

## 2020-03-24 DIAGNOSIS — A419 Sepsis, unspecified organism: Secondary | ICD-10-CM | POA: Diagnosis present

## 2020-03-24 DIAGNOSIS — R6521 Severe sepsis with septic shock: Secondary | ICD-10-CM | POA: Diagnosis present

## 2020-03-24 DIAGNOSIS — Z7189 Other specified counseling: Secondary | ICD-10-CM

## 2020-03-24 LAB — BASIC METABOLIC PANEL
Anion gap: 9 (ref 5–15)
BUN: 28 mg/dL — ABNORMAL HIGH (ref 8–23)
CO2: 20 mmol/L — ABNORMAL LOW (ref 22–32)
Calcium: 7.9 mg/dL — ABNORMAL LOW (ref 8.9–10.3)
Chloride: 114 mmol/L — ABNORMAL HIGH (ref 98–111)
Creatinine, Ser: 1.86 mg/dL — ABNORMAL HIGH (ref 0.61–1.24)
GFR, Estimated: 34 mL/min — ABNORMAL LOW (ref >60)
Glucose, Bld: 303 mg/dL — ABNORMAL HIGH (ref 70–99)
Potassium: 3.5 mmol/L (ref 3.5–5.1)
Sodium: 143 mmol/L (ref 135–145)

## 2020-03-24 LAB — GLUCOSE, CAPILLARY
Glucose-Capillary: 148 mg/dL — ABNORMAL HIGH (ref 70–99)
Glucose-Capillary: 169 mg/dL — ABNORMAL HIGH (ref 70–99)
Glucose-Capillary: 225 mg/dL — ABNORMAL HIGH (ref 70–99)
Glucose-Capillary: 273 mg/dL — ABNORMAL HIGH (ref 70–99)
Glucose-Capillary: 314 mg/dL — ABNORMAL HIGH (ref 70–99)
Glucose-Capillary: 87 mg/dL (ref 70–99)

## 2020-03-24 LAB — CBC
HCT: 30.6 % — ABNORMAL LOW (ref 39.0–52.0)
Hemoglobin: 8.9 g/dL — ABNORMAL LOW (ref 13.0–17.0)
MCH: 27.3 pg (ref 26.0–34.0)
MCHC: 29.1 g/dL — ABNORMAL LOW (ref 30.0–36.0)
MCV: 93.9 fL (ref 80.0–100.0)
Platelets: 156 10*3/uL (ref 150–400)
RBC: 3.26 MIL/uL — ABNORMAL LOW (ref 4.22–5.81)
RDW: 15.2 % (ref 11.5–15.5)
WBC: 8 10*3/uL (ref 4.0–10.5)
nRBC: 0 % (ref 0.0–0.2)

## 2020-03-24 MED ORDER — INSULIN GLARGINE 100 UNIT/ML ~~LOC~~ SOLN
10.0000 [IU] | Freq: Every day | SUBCUTANEOUS | Status: DC
  Administered 2020-03-25 (×2): 10 [IU] via SUBCUTANEOUS
  Filled 2020-03-24 (×3): qty 0.1

## 2020-03-24 NOTE — Progress Notes (Signed)
Inpatient Diabetes Program Recommendations  AACE/ADA: New Consensus Statement on Inpatient Glycemic Control   Target Ranges:  Prepandial:   less than 140 mg/dL      Peak postprandial:   less than 180 mg/dL (1-2 hours)      Critically ill patients:  140 - 180 mg/dL   Results for Paul Perez, Paul Perez "ED" (MRN [id number]) as of 03/24/2020 06:47  Ref. Range 03/23/2020 07:57 03/23/2020 11:58 03/23/2020 15:23 03/23/2020 19:32 03/24/2020 00:10 03/24/2020 04:09  Glucose-Capillary Latest Ref Range: 70 - 99 mg/dL 125 (H) 165 (H) 174 (H) 221 (H) 273 (H) 314 (H)   Review of Glycemic Control  Diabetes history: DM2 Outpatient Diabetes medications: Actos 15 mg daily Current orders for Inpatient glycemic control: Lantus 5 units QHS,Novolog 0-9 units Q4H  Inpatient Diabetes Program Recommendations:    Insulin: If appropriate, please consider increasing Lantus to 12 units QHS.  NOTE: Patient admitted from [facility] SNF with initial glucose of 678 mg/dl. In reviewing chart, noted patient was inpatient 02/22/20-03/06/20, was last ordered Levemir 20 units daily, Novolog 0-9 units TID with meals and Novolog 5 units TID with meals for meal coverage while inpatient (per progress note on 03/05/20 by Dr. Verlon Au). When patient was discharged on 03/06/20, patient was discharged only on Actos 15 mg daily (as previously taken outpatient). Inpatient diabetes coordinator spoke with patient's son on 02/24/20 regarding A1C and likely need to take insulin outpatient.  With A1C >15.5% on 02/22/20 and glucose ov 678 mg/dl on 03/18/20, anticipate patient will need to be discharged on insulin regimen for DM management if appropriate (noted family consider hospice per note on 03/19/20 by M. Cornelia Copa, DNP, FNP-C with Palliative Medicine).   Thanks, Barnie Alderman, RN, MSN, CDE Diabetes Coordinator Inpatient Diabetes Program [phone] (Team Pager from 8am to 5pm)

## 2020-03-24 NOTE — Plan of Care (Signed)
  Problem: Education: Goal: Knowledge of General Education information will improve Description Including pain rating scale, medication(s)/side effects and non-pharmacologic comfort measures Outcome: Progressing   Problem: Health Behavior/Discharge Planning: Goal: Ability to manage health-related needs will improve Outcome: Progressing   

## 2020-03-24 NOTE — Progress Notes (Signed)
Physical Therapy Treatment Patient Details Name: KRRISH FREUND MRN: [id number] DOB: Aug 01, 1927 Today's Date: 03/24/2020    History of Present Illness E Holness is a 86 y.o. male with medical history significant for dementia, T2 DM,Hypertension, CKD stage IV, premature atrial contractions  and history of GI bleed who presents  to the emergency department from [facility] with elevated blood glucose level, confusion and fever.  Patient was unable to provide history possible due to underlying dementia and chronic condition, history was obtained from ED physician assistant and ED medical record.  Per report, patient was noted to be febrile with a temperature of 101F, his blood glucose level was noted to be over 500, he was noted to be confused (patient was able to respond to yes/no answers at baseline), so he was sent to the ED for further evaluation and management.  Patient was recently admitted on 10/9 and discharged on 10/22 due to severe sepsis secondary to Pseudomonas bacteremia (treated with 7 days of IV cefepime with unremarkable repeated blood culture)    PT Comments    Patient demonstrates slow labored movement for sitting up at bedside, tends to lean, fall over to the left while seated at bedside, required tactile and max assist to move legs to the left in order for patient to sit upright, then after 5-8 minutes of sitting with legs blocked, patient able to maintain sitting balance on his own using BUE to support self without legs blocked.  Patient required active assistance to complete hip and knee exercises due to stiffness, once fatigued patient resumed leaning/falling over to the left side and put back to bed with Max/total assist to reposition.  Patient will benefit from continued physical therapy in hospital and recommended venue below to increase strength, balance, endurance for safe ADLs and transfers.    Follow Up Recommendations  SNF     Equipment Recommendations  None  recommended by PT    Recommendations for Other Services       Precautions / Restrictions Precautions Precautions: Fall Restrictions Weight Bearing Restrictions: No    Mobility  Bed Mobility Overal bed mobility: Needs Assistance Bed Mobility: Rolling;Sidelying to Sit;Sit to Sidelying Rolling: Mod assist;Max assist Sidelying to sit: Max assist     Sit to sidelying: Max assist General bed mobility comments: slow labored  movement requring constant verbal/tactile cueing  Transfers                    Ambulation/Gait                 Stairs             Wheelchair Mobility    Modified Rankin (Stroke Patients Only)       Balance Overall balance assessment: Needs assistance Sitting-balance support: Feet supported;Bilateral upper extremity supported Sitting balance-Leahy Scale: Poor Sitting balance - Comments: seated at EOB Postural control: Left lateral lean                                  Cognition Arousal/Alertness: Awake/alert Behavior During Therapy: Flat affect Overall Cognitive Status: History of cognitive impairments - at baseline                                        Exercises General Exercises - Lower Extremity Long Arc Quad: Seated;AAROM;Strengthening;Both;10  reps Hip Flexion/Marching: Seated;AAROM;Strengthening;Both;10 reps    General Comments        Pertinent Vitals/Pain Pain Assessment: Faces Faces Pain Scale: Hurts little more Pain Location: bilateral knees with flexion Pain Descriptors / Indicators: Grimacing;Guarding Pain Intervention(s): Limited activity within patient's tolerance;Monitored during session;Repositioned    Home Living                      Prior Function            PT Goals (current goals can now be found in the care plan section) Acute Rehab PT Goals Patient Stated Goal: return home PT Goal Formulation: With patient Time For Goal Achievement:  04/06/20 Potential to Achieve Goals: Fair Progress towards PT goals: Progressing toward goals    Frequency    Min 2X/week      PT Plan Current plan remains appropriate    Co-evaluation              AM-PAC PT "6 Clicks" Mobility   Outcome Measure  Help needed turning from your back to your side while in a flat bed without using bedrails?: A Lot Help needed moving from lying on your back to sitting on the side of a flat bed without using bedrails?: A Lot Help needed moving to and from a bed to a chair (including a wheelchair)?: Total Help needed standing up from a chair using your arms (e.g., wheelchair or bedside chair)?: Total Help needed to walk in hospital room?: Total Help needed climbing 3-5 steps with a railing? : Total 6 Click Score: 8    End of Session   Activity Tolerance: Patient tolerated treatment well;Patient limited by fatigue Patient left: in bed;with call bell/phone within reach Nurse Communication: Mobility status PT Visit Diagnosis: Unsteadiness on feet (R26.81);Other abnormalities of gait and mobility (R26.89);Muscle weakness (generalized) (M62.81)     Time: 8875-7972 PT Time Calculation (min) (ACUTE ONLY): 30 min  Charges:  $Therapeutic Activity: 23-37 mins                     12:14 PM, 03/24/20 Lonell Grandchild, MPT Physical Therapist with [facility] 336 [phone] office [phone] mobile phone

## 2020-03-24 NOTE — Progress Notes (Signed)
PROGRESS NOTE    Paul Perez  QIO:[id number] DOB: [birth year] DOA: 03/18/2020 PCP: [facility]    Chief Complaint  Patient presents with  . Hyperglycemia    Brief Narrative:  86 year old male prior history of type 2 diabetes, hypertension, stage IV CKD, dementia, GI bleed presents to ED from [facility] for confusion and fever and elevated blood sugars.  Patient unable to provide any history due to underlying dementia.  Patient was found to have right sided pneumonia/ infrahilar pneumonia and urinary tract infection and admitted for severe sepsis. He was also found to be hypernatremic, AKI,  anemic and stool for occult blood is positive. GI consulted , recommended transfusions as needed and conservative management.  In view of his severe sepsis, advanced dementia, clinical deterioration, dysphagia, poor oral intake, hyper natremia not responding to IV treatments, AKI, palliative care consulted for goals of care discussion. He was found to have Pseudomonas bacteremia and was started on broad-spectrum IV antibiotics. His mental status improved with improvement in sodium.  Seen and examined at bedside.  He appears to be more alert today,  opening eyes to verbal cues. He does not appear to be in any kind of distress. Recommend to continue with IV antibiotics to complete a 14 day course of IV antibiotics for bacteremia. Meanwhile palliative care on board to assist with goals of care .        Assessment & Plan:   Principal Problem:   Severe sepsis ([place]) Active Problems:   Hypertension   Hypernatremia   Diabetes mellitus type 2, uncontrolled, with complications (HCC)   Dehydration   CKD (chronic kidney disease) stage 4, GFR 15-29 ml/min (HCC)   Acute febrile illness   Hyperosmolar hyperglycemic state (HHS) (HCC)   GI bleed   Sacral decubitus ulcer, stage II ([place])   Abrasion of left leg   Scrotal skin lesion   Hypoalbuminemia   Acute metabolic encephalopathy    Community acquired pneumonia   Severe sepsis with septic shock ([place])     Acute metabolic encephalopathy: Superimposed on baseline dementia , probably secondary to severe sepsis with septic shock  from UTI, Pneumonia and Pseudomonas bacteremia Currently NPO and SLP eval ordered, couldn't be evaluated due to his altered mental status.  Pt on admission, was febrile, tachypneic RR >25/min, with AKI, lactic acid of 6.6, was found to have pneumonia and UTI, Pseudomonas and clostridium bacteremia.  Sepsis physiology improving.  Patient is afebrile, heart rate has improved respiratory rate has improved. He was started initially on IV cefepime and transition to IV meropenem as his blood cultures grew Clostridium and Pseudomonas.  Repeat blood cultures ordered plan to wean him off the oxygen in the next 24 hours. Urine cultures show multiple bacteria.     Pseudomonas bacteremia Continue with IV meropenem, discussed with ID Dr Linus Salmons recommended 7 days of IV meropenam to complete the course of antibiotics. Last day of Meropenam 03/27/20.    Essential hypertension BP  parameters are slightly elevated, as needed hydralazine will be added to his regimen.  Hyperglycemia sec to poorly controlled type 2 DM:  He was started on IV insulin and transitioned to SSI AND 10 units of Lantus..  CBG (last 3)  Recent Labs    03/24/20 0409 03/24/20 0751 03/24/20 1127  GLUCAP 314* 225* 169*   Continue with sliding scale insulin.  Acute anemia of blood loss possibly GI source.  Hemoccult is positive.  He underwent 2 units of PRBC transfusion hemoglobin has  been stable around 9 posttransfusion GI consulted and recommendations given, plan for conservative management. Plan for upper endoscopy when patient is more stable, if family wants aggressive measures.  Continue with IV PPI BID.    Acute kidney injury on Stage IV CKD,  Monitor renal parameters while on IV fluids.  Creatinine has improved from 3.7-1.8  today Avoid nephrotoxic agents/ dehydration/ hypotension.  Continue with IV fluid resuscitation.    Stage 2 sacral decubitus ulcer:     Wound care consulted.  Pressure Injury 03/19/20 Sacrum (Active)  03/19/20 1600  Location: Sacrum  Location Orientation:   Staging:   Wound Description (Comments):   Present on Admission:     Hypernatremia:  From free water deficit about 7 lit approximately. Secondary to poor oral intake and dehydration from advanced dementia Sodium has improved from 162 to 143.  Continue to encourage oral free water intake to prevent hypernatremia.     Hypokalemia Replaced  Oropharyngeal dysphagia:  Repeat SLP evaluation ordered and he was started on dysphagia 1 diet with NTL.   Hypoalbuminemia sec to mod protein calorie mod Dietary consulted.    Right upper extremity swelling:  Rule out DVT.   In view of his multiple medical problems, declining health, severe sepsis, dementia, dysphagia, hypernatremia, palliative care consulted for goals of care. Discussions going on.      DVT prophylaxis: SCD'S Code Status:DNR.  Family Communication: discussed with son over the phone.  On 03/24/2020 Disposition:   Status is: Inpatient  Remains inpatient appropriate because:Ongoing diagnostic testing needed not appropriate for outpatient work up, IV treatments appropriate due to intensity of illness or inability to take PO and Inpatient level of care appropriate due to severity of illness   Dispo: The patient is from: SNF              Anticipated d/c is to: SNF              Anticipated d/c date is: > 3 days              Patient currently is not medically stable to d/c.       Consultants:   Palliative care  Gastroenterology.   Procedures: none.   Antimicrobials:  Antibiotics Given (last 72 hours)    Date/Time Action Medication Dose Rate   03/21/20 1710 New Bag/Given   meropenem (MERREM) 500 mg in sodium chloride 0.9 % 100 mL IVPB 500 mg 200  mL/hr   03/22/20 0441 New Bag/Given   meropenem (MERREM) 500 mg in sodium chloride 0.9 % 100 mL IVPB 500 mg 200 mL/hr   03/22/20 1511 New Bag/Given   meropenem (MERREM) 500 mg in sodium chloride 0.9 % 100 mL IVPB 500 mg 200 mL/hr   03/23/20 0414 New Bag/Given   meropenem (MERREM) 500 mg in sodium chloride 0.9 % 100 mL IVPB 500 mg 200 mL/hr   03/23/20 1519 New Bag/Given   meropenem (MERREM) 500 mg in sodium chloride 0.9 % 100 mL IVPB 500 mg 200 mL/hr   03/24/20 0322 New Bag/Given   meropenem (MERREM) 500 mg in sodium chloride 0.9 % 100 mL IVPB 500 mg 200 mL/hr         Subjective: Calmer. No distress noted.   Objective: Vitals:   03/24/20 0045 03/24/20 0505 03/24/20 0900 03/24/20 1300  BP: (!) 168/80 (!) 164/74 (!) 161/66 (!) 155/66  Pulse: 86 61 80 75  Resp: 20 20 18 19   Temp: 100 F (37.8 C) 98.9 F (37.2 C) 98.6  F (37 C) 97.6 F (36.4 C)  TempSrc: Oral Oral Oral Oral  SpO2: 93% 100% 98% 100%  Weight:      Height:        Intake/Output Summary (Last 24 hours) at 03/24/2020 1638 Last data filed at 03/24/2020 1300 Gross per 24 hour  Intake 240 ml  Output 1150 ml  Net -910 ml   Filed Weights   03/18/20 1834  Weight: 83 kg    Examination:  General exam: Chronically ill-appearing gentleman, not in any kind of distress respiratory system: Diminished air entry at bases, no wheezing or rhonchi Cardiovascular system: S1-S2 heard, regular rate rhythm, no JVD Gastrointestinal system: Abdomen is soft, nontender, bowel sounds normal Central nervous system: Patient is alert, able to follow simple commands, not in any distress Extremities: Right upper extremity swelling Skin: Sacral decubitus ulcer present on admission Psychiatry: Cannot be assessed due to advanced dementia    Data Reviewed: I have personally reviewed following labs and imaging studies  CBC: Recent Labs  Lab 03/18/20 1858 03/18/20 1858 03/19/20 0419 03/19/20 1259 03/20/20 0923 03/20/20 1447  03/21/20 1233 03/21/20 2216 03/22/20 1102 03/23/20 0731 03/24/20 0522  WBC 10.3  --  11.1*  --  8.0  --   --   --  7.4  --  8.0  NEUTROABS 8.5*  --   --   --   --   --   --   --   --   --   --   HGB 6.8*   < > 6.7*   < > 9.2*   < > 8.7* 8.7* 9.2*  9.2* 9.6* 8.9*  HCT 24.0*   < > 23.5*   < > 30.6*   < > 29.5* 29.6* 31.3*  30.8* 32.0* 30.6*  MCV 92.3  --  95.9  --  93.3  --   --   --  93.7  --  93.9  PLT 342  --  257  --  218  --   --   --  157  --  156   < > = values in this interval not displayed.    Basic Metabolic Panel: Recent Labs  Lab 03/19/20 0419 03/19/20 0909 03/21/20 2217 03/22/20 0605 03/22/20 1414 03/22/20 1600 03/23/20 0731 03/24/20 0522  NA 163*   < > 152* 152* 149*  --  148* 143  K 4.7   < > 3.3* 3.4* 3.0*  --  3.2* 3.5  CL 129*   < > 126* 124* 123*  --  119* 114*  CO2 22   < > 17* 20* 20*  --  21* 20*  GLUCOSE 221*   < > 312* 208* 214*  --  153* 303*  BUN 76*   < > 45* 39* 36*  --  30* 28*  CREATININE 2.70*   < > 2.23* 2.11* 2.03*  --  1.78* 1.86*  CALCIUM 8.4*   < > 7.9* 8.0* 7.8*  --  8.1* 7.9*  MG 2.5*  --   --   --   --  2.4  --   --   PHOS 3.1  --   --   --   --   --   --   --    < > = values in this interval not displayed.    GFR: Estimated Creatinine Clearance: 29 mL/min (A) (by C-G formula based on SCr of 1.86 mg/dL (H)).  Liver Function Tests: Recent Labs  Lab 03/18/20 1858 03/19/20  0419  AST 46* 35  ALT 33 29  ALKPHOS 71 58  BILITOT 0.6 0.8  PROT 6.3* 5.5*  ALBUMIN 2.2* 2.0*    CBG: Recent Labs  Lab 03/23/20 1932 03/24/20 0010 03/24/20 0409 03/24/20 0751 03/24/20 1127  GLUCAP 221* 273* 314* 225* 169*     Recent Results (from the past 240 hour(s))  Respiratory Panel by RT PCR (Flu A&B, Covid) - Nasopharyngeal Swab     Status: None   Collection Time: 03/18/20  6:58 PM   Specimen: Nasopharyngeal Swab  Result Value Ref Range Status   SARS Coronavirus 2 by RT PCR NEGATIVE NEGATIVE Final    Comment: (NOTE) SARS-CoV-2  target nucleic acids are NOT DETECTED.  The SARS-CoV-2 RNA is generally detectable in upper respiratoy specimens during the acute phase of infection. The lowest concentration of SARS-CoV-2 viral copies this assay can detect is 131 copies/mL. A negative result does not preclude SARS-Cov-2 infection and should not be used as the sole basis for treatment or other patient management decisions. A negative result may occur with  improper specimen collection/handling, submission of specimen other than nasopharyngeal swab, presence of viral mutation(s) within the areas targeted by this assay, and inadequate number of viral copies (<131 copies/mL). A negative result must be combined with clinical observations, patient history, and epidemiological information. The expected result is Negative.  Fact Sheet for Patients:  PinkCheek.be  Fact Sheet for Healthcare Providers:  GravelBags.it  This test is no t yet approved or cleared by the [place] FDA and  has been authorized for detection and/or diagnosis of SARS-CoV-2 by FDA under an Emergency Use Authorization (EUA). This EUA will remain  in effect (meaning this test can be used) for the duration of the COVID-19 declaration under Section 564(b)(1) of the Act, 21 U.S.C. section 360bbb-3(b)(1), unless the authorization is terminated or revoked sooner.     Influenza A by PCR NEGATIVE NEGATIVE Final   Influenza B by PCR NEGATIVE NEGATIVE Final    Comment: (NOTE) The Xpert Xpress SARS-CoV-2/FLU/RSV assay is intended as an aid in  the diagnosis of influenza from Nasopharyngeal swab specimens and  should not be used as a sole basis for treatment. Nasal washings and  aspirates are unacceptable for Xpert Xpress SARS-CoV-2/FLU/RSV  testing.  Fact Sheet for Patients: PinkCheek.be  Fact Sheet for Healthcare  Providers: GravelBags.it  This test is not yet approved or cleared by the [place] FDA and  has been authorized for detection and/or diagnosis of SARS-CoV-2 by  FDA under an Emergency Use Authorization (EUA). This EUA will remain  in effect (meaning this test can be used) for the duration of the  Covid-19 declaration under Section 564(b)(1) of the Act, 21  U.S.C. section 360bbb-3(b)(1), unless the authorization is  terminated or revoked. Performed at [facility], [address], [place], [place] [address]   Culture, blood (Routine x 2)     Status: Abnormal   Collection Time: 03/18/20  7:39 PM   Specimen: BLOOD RIGHT ARM  Result Value Ref Range Status   Specimen Description BLOOD RIGHT ARM  Final   Special Requests   Final    BOTTLES DRAWN AEROBIC AND ANAEROBIC Blood Culture adequate volume   Culture  Setup Time   Final    GRAM POSITIVE RODS Gram Stain Report Called to,Read Back By and Verified With: KATIE SITES @1257  03/19/20 BY JONES,T  ANAEROBIC BOTTLE ONLY [facility] GRAM NEGATIVE RODS AEROBIC BOTTLE ONLY CRITICAL RESULT CALLED TO, READ BACK BY AND VERIFIED  WITH: PHARMD J.MENTENHALL AT 1012 ON 03/20/2020 BY T.SAAD    Culture CLOSTRIDIUM RAMOSUM PSEUDOMONAS AERUGINOSA  (A)  Final   Report Status 03/22/2020 FINAL  Final   Organism ID, Bacteria PSEUDOMONAS AERUGINOSA  Final      Susceptibility   Pseudomonas aeruginosa - MIC*    CEFTAZIDIME 4 SENSITIVE Sensitive     CIPROFLOXACIN <=0.25 SENSITIVE Sensitive     GENTAMICIN <=1 SENSITIVE Sensitive     IMIPENEM 2 SENSITIVE Sensitive     PIP/TAZO 16 SENSITIVE Sensitive     CEFEPIME Value in next row Sensitive      SENSITIVEPerformed at [facility] [address], [place], [place] [address]    * PSEUDOMONAS AERUGINOSA  Culture, blood (Routine x 2)     Status: Abnormal   Collection Time: 03/18/20  7:39 PM   Specimen: BLOOD RIGHT ARM  Result Value Ref Range Status   Specimen Description   Final     BLOOD RIGHT ARM Performed at [facility], [address] [place], [place] [address]    Special Requests   Final    BOTTLES DRAWN AEROBIC AND ANAEROBIC Blood Culture adequate volume Performed at [facility], [address], [place], [place] [address]    Culture  Setup Time   Final    GRAM NEGATIVE RODS AEROBIC BOTTLE Gram Stain Report Called to,Read Back By and Verified With: PARSONS,L AT 9924 ON 03/20/20 BY HUFFINES,S. Performed at [facility], [address], [place], [place] [address]    Culture (A)  Final    PSEUDOMONAS AERUGINOSA SUSCEPTIBILITIES PERFORMED ON PREVIOUS CULTURE WITHIN THE LAST 5 DAYS. Performed at [facility], [place] [address], [place], [place] [address]    Report Status 03/22/2020 FINAL  Final  Blood Culture ID Panel (Reflexed)     Status: Abnormal   Collection Time: 03/18/20  7:39 PM  Result Value Ref Range Status   Enterococcus faecalis NOT DETECTED NOT DETECTED Final   Enterococcus Faecium NOT DETECTED NOT DETECTED Final   Listeria monocytogenes NOT DETECTED NOT DETECTED Final   Staphylococcus species NOT DETECTED NOT DETECTED Final   Staphylococcus aureus (BCID) NOT DETECTED NOT DETECTED Final   Staphylococcus epidermidis NOT DETECTED NOT DETECTED Final   Staphylococcus lugdunensis NOT DETECTED NOT DETECTED Final   Streptococcus species NOT DETECTED NOT DETECTED Final   Streptococcus agalactiae NOT DETECTED NOT DETECTED Final   Streptococcus pneumoniae NOT DETECTED NOT DETECTED Final   Streptococcus pyogenes NOT DETECTED NOT DETECTED Final   A.calcoaceticus-baumannii NOT DETECTED NOT DETECTED Final   Bacteroides fragilis NOT DETECTED NOT DETECTED Final   Enterobacterales NOT DETECTED NOT DETECTED Final   Enterobacter cloacae complex NOT DETECTED NOT DETECTED Final   Escherichia coli NOT DETECTED NOT DETECTED Final   Klebsiella aerogenes NOT DETECTED NOT DETECTED Final   Klebsiella oxytoca NOT DETECTED NOT DETECTED Final   Klebsiella pneumoniae  NOT DETECTED NOT DETECTED Final   Proteus species NOT DETECTED NOT DETECTED Final   Salmonella species NOT DETECTED NOT DETECTED Final   Serratia marcescens NOT DETECTED NOT DETECTED Final   Haemophilus influenzae NOT DETECTED NOT DETECTED Final   Neisseria meningitidis NOT DETECTED NOT DETECTED Final   Pseudomonas aeruginosa DETECTED (A) NOT DETECTED Final    Comment: CRITICAL RESULT CALLED TO, READ BACK BY AND VERIFIED WITH: PHARM D J.MENTENHALL AT 1012 ON 03/20/2020 BY T.SAAD    Stenotrophomonas maltophilia NOT DETECTED NOT DETECTED Final   Candida albicans NOT DETECTED NOT DETECTED Final   Candida auris NOT  DETECTED NOT DETECTED Final   Candida glabrata NOT DETECTED NOT DETECTED Final   Candida krusei NOT DETECTED NOT DETECTED Final   Candida parapsilosis NOT DETECTED NOT DETECTED Final   Candida tropicalis NOT DETECTED NOT DETECTED Final   Cryptococcus neoformans/gattii NOT DETECTED NOT DETECTED Final   CTX-M ESBL NOT DETECTED NOT DETECTED Final   Carbapenem resistance IMP NOT DETECTED NOT DETECTED Final   Carbapenem resistance KPC NOT DETECTED NOT DETECTED Final   Carbapenem resistance NDM NOT DETECTED NOT DETECTED Final   Carbapenem resistance VIM NOT DETECTED NOT DETECTED Final    Comment: Performed at [facility], [place] [address], [place], [place] [address]  Urine culture     Status: Abnormal   Collection Time: 03/19/20  7:10 AM   Specimen: In/Out Cath Urine  Result Value Ref Range Status   Specimen Description   Final    IN/OUT CATH URINE Performed at [facility], [address], [place], [place] [address]    Special Requests   Final    NONE Performed at [facility], [address], [place], [place] [address]    Culture MULTIPLE SPECIES PRESENT, SUGGEST RECOLLECTION (A)  Final   Report Status 03/20/2020 FINAL  Final  Culture, blood (Routine X 2) w Reflex to ID Panel     Status: None (Preliminary result)   Collection Time: 03/23/20  2:48 PM   Specimen: BLOOD  LEFT HAND  Result Value Ref Range Status   Specimen Description BLOOD LEFT HAND  Final   Special Requests   Final    BOTTLES DRAWN AEROBIC AND ANAEROBIC Blood Culture adequate volume   Culture   Final    NO GROWTH < 24 HOURS Performed at [facility], [address] [place], [place] [address]    Report Status PENDING  Incomplete  Culture, blood (Routine X 2) w Reflex to ID Panel     Status: None (Preliminary result)   Collection Time: 03/23/20  2:48 PM   Specimen: Left Antecubital; Blood  Result Value Ref Range Status   Specimen Description LEFT ANTECUBITAL  Final   Special Requests   Final    BOTTLES DRAWN AEROBIC ONLY Blood Culture adequate volume   Culture   Final    NO GROWTH < 24 HOURS Performed at [facility], [address], [place], [place] [address]    Report Status PENDING  Incomplete         Radiology Studies: No results found.      Scheduled Meds: . sodium chloride   Intravenous Once  . Gerhardt's butt cream   Topical TID  . insulin aspart  0-9 Units Subcutaneous Q4H  . insulin glargine  10 Units Subcutaneous QHS  . pantoprazole  40 mg Intravenous Q12H   Continuous Infusions: . sodium chloride Stopped (03/18/20 2326)  . meropenem (MERREM) IV 500 mg (03/24/20 0322)     LOS: 6 days        Hosie Poisson, MD [facility]   To contact the attending provider between 7A-7P or the covering provider during after hours 7P-7A, please log into the web site [email or web address] and access using universal [place] password for that web site. If you do not have the password, please call the hospital operator.  03/24/2020, 4:38 PM

## 2020-03-24 NOTE — Progress Notes (Signed)
FullPalliative: Paul Perez is resting quietly in bed.  He appears acutely/chronically ill and quite frail.  He will open his eyes when I call his name, making and somewhat keeping eye contact.  I believe that he can make his basic needs known.  There is no family at bedside at this time.    Call to son Paul Perez.  We talk about Paul Perez acute and chronic health concerns.  Paul Perez shares that he is unsure about disposition plan, he feels that his father would benefit from hospice care, but knows that he cannot care for him at home.  We talked about long-term care, applying for Medicaid.  Paul Perez feels that his father was never applied for Medicaid.  We talked about the logistics, and Paul Perez shares that Paul Perez makes more than $1000 per month in Social Security.   We talked about other options for hospice care including residential hospice.  Paul Perez states that he had considered residential hospice, feeling that his father would receive much better care at a hospice home than in a nursing home.  I share the process, and for Paul Perez information will be sent to residential hospice, they will evaluate, and reach out to [facility], likely tomorrow.  He states understanding.  Paul Perez would clearly qualify for residential hospice with an albumin of 2.0.    Conference with attending, bedside nursing staff, transition of care team related to patient condition, needs, goals of care, hospice referral.  Plan: Son Paul Perez is requesting comfort and dignity at end-of-life, residential hospice at [facility] [place]. Prognosis: 2 weeks or less anticipated.  Family's desire is to focus on comfort only, let nature take its course.  29 minutes Quinn Axe, NP Palliative Medicine Team Team Phone # [phone] Greater than 50% of this time was spent counseling and coordinating care related to the above assessment and plan.

## 2020-03-24 NOTE — TOC Progression Note (Addendum)
Transition of Care [facility]) - Progression Note    Patient Details  Name: Paul Perez MRN: [id number] Date of Birth: December 27, 1927  Transition of Care [facility] CM/SW [place], [place] Phone Number: 03/24/2020, 3:26 PM  Clinical Narrative:    TOC updated family plan to d/c pt to SNF with hospice services. CSW called to speak with Paul Perez at [facility]. Paul Perez states that pt can return for SNF but will need new auth. However, if pt is in need of hospice services it will have to be private pay by the family at $215 a day. CSW attempted to call pts son Paul Perez [phone] who is pts legal guardian to inform him of private pay if pt returns to SNF. Pts son not answering, CSW left 2 voicemails asking for callback as soon as possible. CSW spoke with pts daughter Paul Perez [phone] to see if she had contact with [name]. Ms. Paul Perez states she will try to contact brother and ask him to call CSW however she only has the same number CSW has. CSW informed Ms. Mimmons of cost to private pay for hospice if pt goes to SNF. TOC to speak with legal guardian/son upon returned phone call. TOC to follow.   Addendum (5:27pm): CSW spoke with pts son to update on private pay cost to have Hospice at [facility] facility. Pts son mentioned possibility of pt needing long term care, CSW informed pts son that would also be private pay until pt could be established with Medicaid. CSW informed pt that NP with Palliative would be reaching out to speak with him and that TOC would f/p with palliative about pt needs. CSW informed by NP with Palliative that pts family is seeking residential hospice at [facility] for pt. CSW spoke with pts son/legal guardian to confirm of familys decision. CSW placed call to Hospice after hours line, awaiting call from on call RN to make referral. TOC to follow.   Addendum (6:33pm): CSW received call back from on call nurse [facility] with Hospice. Paul Perez  asked that [facility] fax H&P, Attending MD most recent progress note, Palliative note, and facesheet to [facility] with Hospice. Paul Perez states that she will make sure Paul Perez gets referral and that if no beds avaliable pt may be set up in virtual bed. Clinicals faxed to Hospice. TOC to follow.   Expected Discharge Plan: [place] Barriers to Discharge: Continued Medical Work up  Expected Discharge Plan and Services Expected Discharge Plan: [place] In-house Referral: Clinical Social Work, Hospice / Palliative Care Discharge Planning Services: NA Post Acute Care Choice: Hospice Living arrangements for the past 2 months: [place]                 DME Arranged: N/A DME Agency: NA       HH Arranged: NA HH Agency: NA         Social Determinants of Health (SDOH) Interventions    Readmission Risk Interventions Readmission Risk Prevention Plan 03/19/2020  Transportation Screening Complete  Medication Review [employer]) Complete  HRI or Home Care Consult Complete  SW Recovery Care/Counseling Consult Complete  Palliative Care Screening Complete  Skilled Nursing Facility Complete  Some recent data might be hidden

## 2020-03-25 ENCOUNTER — Inpatient Hospital Stay (HOSPITAL_COMMUNITY): Payer: Medicare Other

## 2020-03-25 LAB — GLUCOSE, CAPILLARY
Glucose-Capillary: 140 mg/dL — ABNORMAL HIGH (ref 70–99)
Glucose-Capillary: 143 mg/dL — ABNORMAL HIGH (ref 70–99)
Glucose-Capillary: 150 mg/dL — ABNORMAL HIGH (ref 70–99)
Glucose-Capillary: 151 mg/dL — ABNORMAL HIGH (ref 70–99)
Glucose-Capillary: 160 mg/dL — ABNORMAL HIGH (ref 70–99)
Glucose-Capillary: 179 mg/dL — ABNORMAL HIGH (ref 70–99)
Glucose-Capillary: 206 mg/dL — ABNORMAL HIGH (ref 70–99)

## 2020-03-25 NOTE — Progress Notes (Signed)
SLP Cancellation Note  Patient Details Name: Paul Perez MRN: [id number] DOB: April 21, 1928   Cancelled treatment:       Reason Eval/Treat Not Completed: Other (comment) (Pt had just finished breakfast meals). SLP spoke with CNA who fed Pt breakfast. She reported that he ate well (puree and NTL) and also had thin water. SLP explained that Pt is on NTL (he silently aspirated trace amounts of thin liquids during MBSS and only indicator was elevated HR). Pt may be pursuing hospice per chart review and if that is the case, consider liberalizing diet to include thin liquids for comfort if desired. SLP will check back per goals of care.   Thank you,  Genene Churn, [place]    [place] 03/25/2020, 12:06 PM

## 2020-03-25 NOTE — Plan of Care (Signed)
  Problem: Education: Goal: Knowledge of General Education information will improve Description Including pain rating scale, medication(s)/side effects and non-pharmacologic comfort measures Outcome: Progressing   Problem: Health Behavior/Discharge Planning: Goal: Ability to manage health-related needs will improve Outcome: Progressing   

## 2020-03-25 NOTE — Progress Notes (Signed)
PROGRESS NOTE    Paul Perez  YHC:[id number] DOB: [birth year] DOA: 03/18/2020 PCP: [facility]    Chief Complaint  Patient presents with  . Hyperglycemia    Brief Narrative:  86 year old male prior history of type 2 diabetes, hypertension, stage IV CKD, dementia, GI bleed presents to ED from [facility] for confusion and fever and elevated blood sugars.  Patient unable to provide any history due to underlying dementia.  Patient was found to have right sided pneumonia/ infrahilar pneumonia and urinary tract infection and admitted for severe sepsis. He was also found to be hypernatremic, AKI,  anemic and stool for occult blood is positive. GI consulted , recommended transfusions as needed and conservative management.  In view of his severe sepsis, advanced dementia, clinical deterioration, dysphagia, poor oral intake, hyper natremia not responding to IV treatments, AKI, palliative care consulted for goals of care discussion. He was found to have Pseudomonas bacteremia and was started on broad-spectrum IV antibiotics. His mental status improved with improvement in sodium.  Seen and examined at bedside.  He appears to be more alert today,  opening eyes to verbal cues. He does not appear to be in any kind of distress. Recommend to continue with IV antibiotics to complete a 14 day course of IV antibiotics for bacteremia. Meanwhile palliative care on board to assist with goals of care .        Assessment & Plan:   Principal Problem:   Severe sepsis ([place]) Active Problems:   Hypertension   Hypernatremia   Diabetes mellitus type 2, uncontrolled, with complications (HCC)   Dehydration   CKD (chronic kidney disease) stage 4, GFR 15-29 ml/min (HCC)   Acute febrile illness   Hyperosmolar hyperglycemic state (HHS) (HCC)   GI bleed   Sacral decubitus ulcer, stage II ([place])   Abrasion of left leg   Scrotal skin lesion   Hypoalbuminemia   Acute metabolic encephalopathy    Community acquired pneumonia   Severe sepsis with septic shock ([place])   Encounter for hospice care discussion     Acute metabolic encephalopathy: Superimposed on baseline dementia , probably secondary to severe sepsis with septic shock  from UTI, Pneumonia and Pseudomonas bacteremia Currently NPO and SLP eval ordered, couldn't be evaluated due to his altered mental status.  Pt on admission, was febrile, tachypneic RR >25/min, with AKI, lactic acid of 6.6, was found to have pneumonia and UTI, Pseudomonas and clostridium bacteremia.  Sepsis physiology improving.  Patient is afebrile, heart rate has improved respiratory rate has improved. He was started initially on IV cefepime and transition to IV meropenem as his blood cultures grew Clostridium and Pseudomonas.  Repeat blood cultures ordered plan to wean him off the oxygen in the next 24 hours. Urine cultures show multiple bacteria.     Pseudomonas bacteremia Continue with IV meropenem, discussed with ID Dr Linus Salmons recommended 7 days of IV meropenam to complete the course of antibiotics. Last day of Meropenam 03/27/20.    Essential hypertension BP  parameters are slightly elevated, as needed hydralazine will be added to his regimen.  Hyperglycemia sec to poorly controlled type 2 DM:  He was started on IV insulin and transitioned to SSI AND 10 units of Lantus..  CBG (last 3)  Recent Labs    03/25/20 1140 03/25/20 1626 03/25/20 2011  GLUCAP 140* 179* 160*   Continue with sliding scale insulin.  Acute anemia of blood loss possibly GI source.  Hemoccult is positive.  He underwent  2 units of PRBC transfusion hemoglobin has been stable around 9 posttransfusion GI consulted and recommendations given, plan for conservative management. Plan for upper endoscopy when patient is more stable, if family wants aggressive measures.  Continue with IV PPI BID.    Acute kidney injury on Stage IV CKD,  Monitor renal parameters while on IV fluids.   Creatinine has improved from 3.7-1.8 today Avoid nephrotoxic agents/ dehydration/ hypotension.  Continue with IV fluid resuscitation.    Stage 2 sacral decubitus ulcer:     Wound care consulted.  With patient's hypoalbuminemia, I do not anticipate that this will heal Pressure Injury 03/19/20 Sacrum (Active)  03/19/20 1600  Location: Sacrum  Location Orientation:   Staging:   Wound Description (Comments):   Present on Admission:     Hypernatremia:  From free water deficit about 7 lit approximately. Secondary to poor oral intake and dehydration from advanced dementia Sodium has improved from 162 to 143.  Continue to encourage oral free water intake to prevent hypernatremia.     Hypokalemia Replaced  Oropharyngeal dysphagia:  Repeat SLP evaluation ordered and he was started on dysphagia 1 diet with NTL.   Hypoalbuminemia sec to mod protein calorie mod Dietary consulted.    Right upper extremity swelling:  No evidence of DVT  In view of his multiple medical problems, declining health, severe sepsis, dementia, dysphagia, hypernatremia, palliative care consulted for goals of care.  After several discussions, family has elected to transition patient to comfort care.  Anticipate that he will be evaluated by hospice and transition to GIP status.      DVT prophylaxis: SCD'S Code Status:DNR.  Family Communication: discussed with son over the phone.  On 03/24/2020 Disposition:   Status is: Inpatient  Remains inpatient appropriate because:Ongoing diagnostic testing needed not appropriate for outpatient work up, IV treatments appropriate due to intensity of illness or inability to take PO and Inpatient level of care appropriate due to severity of illness   Dispo: The patient is from: SNF              Anticipated d/c is to: Residential hospice              Anticipated d/c date is: 1 day              Patient currently is not medically stable to d/c.       Consultants:     Palliative care  Gastroenterology.   Procedures: none.   Antimicrobials:  Antibiotics Given (last 72 hours)    Date/Time Action Medication Dose Rate   03/23/20 0414 New Bag/Given   meropenem (MERREM) 500 mg in sodium chloride 0.9 % 100 mL IVPB 500 mg 200 mL/hr   03/23/20 1519 New Bag/Given   meropenem (MERREM) 500 mg in sodium chloride 0.9 % 100 mL IVPB 500 mg 200 mL/hr   03/24/20 0322 New Bag/Given   meropenem (MERREM) 500 mg in sodium chloride 0.9 % 100 mL IVPB 500 mg 200 mL/hr   03/24/20 1853 New Bag/Given   meropenem (MERREM) 500 mg in sodium chloride 0.9 % 100 mL IVPB 500 mg 200 mL/hr   03/25/20 0534 New Bag/Given   meropenem (MERREM) 500 mg in sodium chloride 0.9 % 100 mL IVPB 500 mg 200 mL/hr   03/25/20 1547 New Bag/Given   meropenem (MERREM) 500 mg in sodium chloride 0.9 % 100 mL IVPB 500 mg 200 mL/hr         Subjective: Patient is somnolent, briefly wakes up to voice  but falls back asleep.  Does not answer questions or follow commands  Objective: Vitals:   03/24/20 0900 03/24/20 1300 03/24/20 2248 03/25/20 1458  BP: (!) 161/66 (!) 155/66 (!) 151/80 (!) 154/61  Pulse: 80 75 75 76  Resp: 18 19 20 18   Temp: 98.6 F (37 C) 97.6 F (36.4 C) 98.6 F (37 C) 98.7 F (37.1 C)  TempSrc: Oral Oral Oral Oral  SpO2: 98% 100% (!) 69% 100%  Weight:      Height:        Intake/Output Summary (Last 24 hours) at 03/25/2020 2036 Last data filed at 03/25/2020 0900 Gross per 24 hour  Intake 240 ml  Output 350 ml  Net -110 ml   Filed Weights   03/18/20 1834  Weight: 83 kg    Examination:  General exam: Somnolent Respiratory system: Clear to auscultation. Respiratory effort normal. Cardiovascular system:RRR. No murmurs, rubs, gallops. Gastrointestinal system: Abdomen is nondistended, soft and nontender. No organomegaly or masses felt. Normal bowel sounds heard.    Data Reviewed: I have personally reviewed following labs and imaging studies  CBC: Recent Labs   Lab 03/19/20 0419 03/19/20 1259 03/20/20 0923 03/20/20 1447 03/21/20 1233 03/21/20 2216 03/22/20 1102 03/23/20 0731 03/24/20 0522  WBC 11.1*  --  8.0  --   --   --  7.4  --  8.0  HGB 6.7*   < > 9.2*   < > 8.7* 8.7* 9.2*  9.2* 9.6* 8.9*  HCT 23.5*   < > 30.6*   < > 29.5* 29.6* 31.3*  30.8* 32.0* 30.6*  MCV 95.9  --  93.3  --   --   --  93.7  --  93.9  PLT 257  --  218  --   --   --  157  --  156   < > = values in this interval not displayed.    Basic Metabolic Panel: Recent Labs  Lab 03/19/20 0419 03/19/20 0909 03/21/20 2217 03/22/20 0605 03/22/20 1414 03/22/20 1600 03/23/20 0731 03/24/20 0522  NA 163*   < > 152* 152* 149*  --  148* 143  K 4.7   < > 3.3* 3.4* 3.0*  --  3.2* 3.5  CL 129*   < > 126* 124* 123*  --  119* 114*  CO2 22   < > 17* 20* 20*  --  21* 20*  GLUCOSE 221*   < > 312* 208* 214*  --  153* 303*  BUN 76*   < > 45* 39* 36*  --  30* 28*  CREATININE 2.70*   < > 2.23* 2.11* 2.03*  --  1.78* 1.86*  CALCIUM 8.4*   < > 7.9* 8.0* 7.8*  --  8.1* 7.9*  MG 2.5*  --   --   --   --  2.4  --   --   PHOS 3.1  --   --   --   --   --   --   --    < > = values in this interval not displayed.    GFR: Estimated Creatinine Clearance: 29 mL/min (A) (by C-G formula based on SCr of 1.86 mg/dL (H)).  Liver Function Tests: Recent Labs  Lab 03/19/20 0419  AST 35  ALT 29  ALKPHOS 58  BILITOT 0.8  PROT 5.5*  ALBUMIN 2.0*    CBG: Recent Labs  Lab 03/25/20 0346 03/25/20 0735 03/25/20 1140 03/25/20 1626 03/25/20 2011  GLUCAP 151* 143* 140* 179*  160*     Recent Results (from the past 240 hour(s))  Respiratory Panel by RT PCR (Flu A&B, Covid) - Nasopharyngeal Swab     Status: None   Collection Time: 03/18/20  6:58 PM   Specimen: Nasopharyngeal Swab  Result Value Ref Range Status   SARS Coronavirus 2 by RT PCR NEGATIVE NEGATIVE Final    Comment: (NOTE) SARS-CoV-2 target nucleic acids are NOT DETECTED.  The SARS-CoV-2 RNA is generally detectable in upper  respiratoy specimens during the acute phase of infection. The lowest concentration of SARS-CoV-2 viral copies this assay can detect is 131 copies/mL. A negative result does not preclude SARS-Cov-2 infection and should not be used as the sole basis for treatment or other patient management decisions. A negative result may occur with  improper specimen collection/handling, submission of specimen other than nasopharyngeal swab, presence of viral mutation(s) within the areas targeted by this assay, and inadequate number of viral copies (<131 copies/mL). A negative result must be combined with clinical observations, patient history, and epidemiological information. The expected result is Negative.  Fact Sheet for Patients:  PinkCheek.be  Fact Sheet for Healthcare Providers:  GravelBags.it  This test is no t yet approved or cleared by the [place] FDA and  has been authorized for detection and/or diagnosis of SARS-CoV-2 by FDA under an Emergency Use Authorization (EUA). This EUA will remain  in effect (meaning this test can be used) for the duration of the COVID-19 declaration under Section 564(b)(1) of the Act, 21 U.S.C. section 360bbb-3(b)(1), unless the authorization is terminated or revoked sooner.     Influenza A by PCR NEGATIVE NEGATIVE Final   Influenza B by PCR NEGATIVE NEGATIVE Final    Comment: (NOTE) The Xpert Xpress SARS-CoV-2/FLU/RSV assay is intended as an aid in  the diagnosis of influenza from Nasopharyngeal swab specimens and  should not be used as a sole basis for treatment. Nasal washings and  aspirates are unacceptable for Xpert Xpress SARS-CoV-2/FLU/RSV  testing.  Fact Sheet for Patients: PinkCheek.be  Fact Sheet for Healthcare Providers: GravelBags.it  This test is not yet approved or cleared by the [place] FDA and  has been  authorized for detection and/or diagnosis of SARS-CoV-2 by  FDA under an Emergency Use Authorization (EUA). This EUA will remain  in effect (meaning this test can be used) for the duration of the  Covid-19 declaration under Section 564(b)(1) of the Act, 21  U.S.C. section 360bbb-3(b)(1), unless the authorization is  terminated or revoked. Performed at [facility], [address], [place], [place] [address]   Culture, blood (Routine x 2)     Status: Abnormal   Collection Time: 03/18/20  7:39 PM   Specimen: BLOOD RIGHT ARM  Result Value Ref Range Status   Specimen Description BLOOD RIGHT ARM  Final   Special Requests   Final    BOTTLES DRAWN AEROBIC AND ANAEROBIC Blood Culture adequate volume   Culture  Setup Time   Final    GRAM POSITIVE RODS Gram Stain Report Called to,Read Back By and Verified With: KATIE SITES @1257  03/19/20 BY JONES,T  ANAEROBIC BOTTLE ONLY [facility] GRAM NEGATIVE RODS AEROBIC BOTTLE ONLY CRITICAL RESULT CALLED TO, READ BACK BY AND VERIFIED WITH: PHARMD J.MENTENHALL AT 1012 ON 03/20/2020 BY T.SAAD    Culture CLOSTRIDIUM RAMOSUM PSEUDOMONAS AERUGINOSA  (A)  Final   Report Status 03/22/2020 FINAL  Final   Organism ID, Bacteria PSEUDOMONAS AERUGINOSA  Final      Susceptibility   Pseudomonas aeruginosa -  MIC*    CEFTAZIDIME 4 SENSITIVE Sensitive     CIPROFLOXACIN <=0.25 SENSITIVE Sensitive     GENTAMICIN <=1 SENSITIVE Sensitive     IMIPENEM 2 SENSITIVE Sensitive     PIP/TAZO 16 SENSITIVE Sensitive     CEFEPIME Value in next row Sensitive      SENSITIVEPerformed at [facility] [address], [place], [place] [address]    * PSEUDOMONAS AERUGINOSA  Culture, blood (Routine x 2)     Status: Abnormal   Collection Time: 03/18/20  7:39 PM   Specimen: BLOOD RIGHT ARM  Result Value Ref Range Status   Specimen Description   Final    BLOOD RIGHT ARM Performed at [facility], [address] [place], [place] [address]    Special Requests   Final    BOTTLES DRAWN  AEROBIC AND ANAEROBIC Blood Culture adequate volume Performed at [facility], [address] [place], [place] [address]    Culture  Setup Time   Final    GRAM NEGATIVE RODS AEROBIC BOTTLE Gram Stain Report Called to,Read Back By and Verified With: PARSONS,L AT 3716 ON 03/20/20 BY HUFFINES,S. Performed at [facility], [address], [place], [place] [address]    Culture (A)  Final    PSEUDOMONAS AERUGINOSA SUSCEPTIBILITIES PERFORMED ON PREVIOUS CULTURE WITHIN THE LAST 5 DAYS. Performed at [facility], [place] [address], [place], [place] [address]    Report Status 03/22/2020 FINAL  Final  Blood Culture ID Panel (Reflexed)     Status: Abnormal   Collection Time: 03/18/20  7:39 PM  Result Value Ref Range Status   Enterococcus faecalis NOT DETECTED NOT DETECTED Final   Enterococcus Faecium NOT DETECTED NOT DETECTED Final   Listeria monocytogenes NOT DETECTED NOT DETECTED Final   Staphylococcus species NOT DETECTED NOT DETECTED Final   Staphylococcus aureus (BCID) NOT DETECTED NOT DETECTED Final   Staphylococcus epidermidis NOT DETECTED NOT DETECTED Final   Staphylococcus lugdunensis NOT DETECTED NOT DETECTED Final   Streptococcus species NOT DETECTED NOT DETECTED Final   Streptococcus agalactiae NOT DETECTED NOT DETECTED Final   Streptococcus pneumoniae NOT DETECTED NOT DETECTED Final   Streptococcus pyogenes NOT DETECTED NOT DETECTED Final   A.calcoaceticus-baumannii NOT DETECTED NOT DETECTED Final   Bacteroides fragilis NOT DETECTED NOT DETECTED Final   Enterobacterales NOT DETECTED NOT DETECTED Final   Enterobacter cloacae complex NOT DETECTED NOT DETECTED Final   Escherichia coli NOT DETECTED NOT DETECTED Final   Klebsiella aerogenes NOT DETECTED NOT DETECTED Final   Klebsiella oxytoca NOT DETECTED NOT DETECTED Final   Klebsiella pneumoniae NOT DETECTED NOT DETECTED Final   Proteus species NOT DETECTED NOT DETECTED Final   Salmonella species NOT DETECTED NOT DETECTED Final     Serratia marcescens NOT DETECTED NOT DETECTED Final   Haemophilus influenzae NOT DETECTED NOT DETECTED Final   Neisseria meningitidis NOT DETECTED NOT DETECTED Final   Pseudomonas aeruginosa DETECTED (A) NOT DETECTED Final    Comment: CRITICAL RESULT CALLED TO, READ BACK BY AND VERIFIED WITH: PHARM D J.MENTENHALL AT 1012 ON 03/20/2020 BY T.SAAD    Stenotrophomonas maltophilia NOT DETECTED NOT DETECTED Final   Candida albicans NOT DETECTED NOT DETECTED Final   Candida auris NOT DETECTED NOT DETECTED Final   Candida glabrata NOT DETECTED NOT DETECTED Final   Candida krusei NOT DETECTED NOT DETECTED Final   Candida parapsilosis NOT DETECTED NOT DETECTED Final   Candida tropicalis NOT DETECTED NOT DETECTED Final   Cryptococcus neoformans/gattii NOT DETECTED NOT DETECTED  Final   CTX-M ESBL NOT DETECTED NOT DETECTED Final   Carbapenem resistance IMP NOT DETECTED NOT DETECTED Final   Carbapenem resistance KPC NOT DETECTED NOT DETECTED Final   Carbapenem resistance NDM NOT DETECTED NOT DETECTED Final   Carbapenem resistance VIM NOT DETECTED NOT DETECTED Final    Comment: Performed at [facility], [place] [address], [place], [place] [address]  Urine culture     Status: Abnormal   Collection Time: 03/19/20  7:10 AM   Specimen: In/Out Cath Urine  Result Value Ref Range Status   Specimen Description   Final    IN/OUT CATH URINE Performed at [facility], [address], [place], [place] [address]    Special Requests   Final    NONE Performed at [facility], [address], [place], [place] [address]    Culture MULTIPLE SPECIES PRESENT, SUGGEST RECOLLECTION (A)  Final   Report Status 03/20/2020 FINAL  Final  Culture, blood (Routine X 2) w Reflex to ID Panel     Status: None (Preliminary result)   Collection Time: 03/23/20  2:48 PM   Specimen: BLOOD LEFT HAND  Result Value Ref Range Status   Specimen Description BLOOD LEFT HAND  Final   Special Requests   Final    BOTTLES DRAWN  AEROBIC AND ANAEROBIC Blood Culture adequate volume   Culture   Final    NO GROWTH 2 DAYS Performed at [facility], [address] [place],  [address]    Report Status PENDING  Incomplete  Culture, blood (Routine X 2) w Reflex to ID Panel     Status: None (Preliminary result)   Collection Time: 03/23/20  2:48 PM   Specimen: Left Antecubital; Blood  Result Value Ref Range Status   Specimen Description LEFT ANTECUBITAL  Final   Special Requests   Final    BOTTLES DRAWN AEROBIC ONLY Blood Culture adequate volume   Culture   Final    NO GROWTH 2 DAYS Performed at [facility], [address] [place],  [address]    Report Status PENDING  Incomplete         Radiology Studies: US Venous Img Upper Uni Right(DVT)  Result Date: 03/25/2020 CLINICAL DATA:  Right upper extremity edema. EXAM: RIGHT UPPER EXTREMITY VENOUS DOPPLER ULTRASOUND TECHNIQUE: Gray-scale sonography with graded compression, as well as color Doppler and duplex ultrasound were performed to evaluate the upper extremity deep venous system from the level of the subclavian vein and including the jugular, axillary, basilic, radial, ulnar and upper cephalic vein. Spectral Doppler was utilized to evaluate flow at rest and with distal augmentation maneuvers. COMPARISON:  None. FINDINGS: Contralateral Subclavian Vein: Respiratory phasicity is normal and symmetric with the symptomatic side. No evidence of thrombus. Normal compressibility. Internal Jugular Vein: No evidence of thrombus. Normal compressibility, respiratory phasicity and response to augmentation. Subclavian Vein: No evidence of thrombus. Normal compressibility, respiratory phasicity and response to augmentation. Axillary Vein: No evidence of thrombus. Normal compressibility, respiratory phasicity and response to augmentation. Cephalic Vein: The visualized fell vein demonstrates occlusive thrombus and is noncompressible. Basilic Vein: The basilic vein is not  identified Brachial Veins: No evidence of thrombus. Normal compressibility, respiratory phasicity and response to augmentation. Radial Veins: No evidence of thrombus. Normal compressibility, respiratory phasicity and response to augmentation. Ulnar Veins: No evidence of thrombus. Normal compressibility, respiratory phasicity and response to augmentation. Venous Reflux:  None visualized. Other Findings:  No abnormal fluid collections. IMPRESSION: 1. Superficial thrombophlebitis within the cephalic vein  in the right arm. 2. No evidence of right upper extremity deep vein thrombosis. Electronically Signed   By: Aletta Edouard M.D.   On: 03/25/2020 09:30        Scheduled Meds: . sodium chloride   Intravenous Once  . Gerhardt's butt cream   Topical TID  . insulin aspart  0-9 Units Subcutaneous Q4H  . insulin glargine  10 Units Subcutaneous QHS  . pantoprazole  40 mg Intravenous Q12H   Continuous Infusions: . sodium chloride Stopped (03/18/20 2326)  . meropenem (MERREM) IV 500 mg (03/25/20 1547)     LOS: 7 days        Kathie Dike, MD [facility]   To contact the attending provider between 7A-7P or the covering provider during after hours 7P-7A, please log into the web site [email or web address] and access using universal [place] password for that web site. If you do not have the password, please call the hospital operator.  03/25/2020, 8:36 PM

## 2020-03-25 NOTE — Progress Notes (Signed)
Palliative: Paul Perez is resting quietly in bed.  He wakes easily when I called his name.  He will make and somewhat keeps eye contact, appearing acutely/chronically ill and frail.  He is able to make his basic needs known, there is no family at bedside at this time.  Paul Perez is able to take some water from me without overt signs and symptoms of aspiration.  [facility] is to evaluate Paul Perez today for residential hospice bed.  Conference with attending and transition of care team related to patient condition, needs, goals of care, hospice referral.  Plan: Plan is for full comfort care if accepted by hospice for residential care Prognosis: 2 to 3 weeks or less would be anticipated based on frailty, poor functional status, chronic illness burden, albumin of 2.0  25 minutes Quinn Axe, NP Palliative Medicine Team Team Phone # [phone] Greater than 50% of this time was spent counseling and coordinating care related to the above assessment and plan.

## 2020-03-26 ENCOUNTER — Inpatient Hospital Stay (HOSPITAL_COMMUNITY)
Admission: RE | Admit: 2020-03-26 | Discharge: 2020-03-27 | DRG: 951 | Disposition: A | Discharge status: Hospice | Source: Skilled Nursing Facility | Attending: Internal Medicine | Admitting: Internal Medicine

## 2020-03-26 DIAGNOSIS — E1165 Type 2 diabetes mellitus with hyperglycemia: Secondary | ICD-10-CM | POA: Diagnosis present

## 2020-03-26 DIAGNOSIS — N390 Urinary tract infection, site not specified: Secondary | ICD-10-CM | POA: Diagnosis present

## 2020-03-26 DIAGNOSIS — Z66 Do not resuscitate: Secondary | ICD-10-CM | POA: Diagnosis present

## 2020-03-26 DIAGNOSIS — B965 Pseudomonas (aeruginosa) (mallei) (pseudomallei) as the cause of diseases classified elsewhere: Secondary | ICD-10-CM | POA: Diagnosis present

## 2020-03-26 DIAGNOSIS — Z833 Family history of diabetes mellitus: Secondary | ICD-10-CM

## 2020-03-26 DIAGNOSIS — Z20822 Contact with and (suspected) exposure to covid-19: Secondary | ICD-10-CM | POA: Diagnosis present

## 2020-03-26 DIAGNOSIS — Z515 Encounter for palliative care: Principal | ICD-10-CM

## 2020-03-26 DIAGNOSIS — N184 Chronic kidney disease, stage 4 (severe): Secondary | ICD-10-CM | POA: Diagnosis present

## 2020-03-26 DIAGNOSIS — R627 Adult failure to thrive: Secondary | ICD-10-CM | POA: Diagnosis present

## 2020-03-26 DIAGNOSIS — L89159 Pressure ulcer of sacral region, unspecified stage: Secondary | ICD-10-CM | POA: Diagnosis present

## 2020-03-26 DIAGNOSIS — R509 Fever, unspecified: Secondary | ICD-10-CM | POA: Diagnosis present

## 2020-03-26 DIAGNOSIS — I129 Hypertensive chronic kidney disease with stage 1 through stage 4 chronic kidney disease, or unspecified chronic kidney disease: Secondary | ICD-10-CM | POA: Diagnosis present

## 2020-03-26 DIAGNOSIS — R1310 Dysphagia, unspecified: Secondary | ICD-10-CM | POA: Diagnosis present

## 2020-03-26 DIAGNOSIS — E1122 Type 2 diabetes mellitus with diabetic chronic kidney disease: Secondary | ICD-10-CM | POA: Diagnosis present

## 2020-03-26 DIAGNOSIS — R7881 Bacteremia: Secondary | ICD-10-CM | POA: Diagnosis not present

## 2020-03-26 DIAGNOSIS — J189 Pneumonia, unspecified organism: Secondary | ICD-10-CM | POA: Diagnosis present

## 2020-03-26 DIAGNOSIS — R001 Bradycardia, unspecified: Secondary | ICD-10-CM | POA: Diagnosis present

## 2020-03-26 DIAGNOSIS — R6521 Severe sepsis with septic shock: Secondary | ICD-10-CM

## 2020-03-26 DIAGNOSIS — F0390 Unspecified dementia without behavioral disturbance: Secondary | ICD-10-CM | POA: Diagnosis present

## 2020-03-26 DIAGNOSIS — A4152 Sepsis due to Pseudomonas: Secondary | ICD-10-CM | POA: Diagnosis present

## 2020-03-26 LAB — GLUCOSE, CAPILLARY
Glucose-Capillary: 198 mg/dL — ABNORMAL HIGH (ref 70–99)
Glucose-Capillary: 162 mg/dL — ABNORMAL HIGH (ref 70–99)
Glucose-Capillary: 115 mg/dL — ABNORMAL HIGH (ref 70–99)

## 2020-03-26 MED ORDER — POLYVINYL ALCOHOL 1.4 % OP SOLN: 1.0000 [drp] | Freq: Four times a day (QID) | OPHTHALMIC | Status: DC | PRN

## 2020-03-26 MED ORDER — ONDANSETRON HCL 4 MG/2ML IJ SOLN: 4.0000 mg | Freq: Four times a day (QID) | INTRAMUSCULAR | Status: DC | PRN

## 2020-03-26 MED ORDER — HALOPERIDOL LACTATE 5 MG/ML IJ SOLN: 0.5000 mg | INTRAMUSCULAR | Status: DC | PRN

## 2020-03-26 MED ORDER — SODIUM CHLORIDE 0.9 % IV SOLN: 1.0000 g | Freq: Two times a day (BID) | INTRAVENOUS | Status: DC

## 2020-03-26 MED ORDER — ACETAMINOPHEN 650 MG RE SUPP: 650.0000 mg | Freq: Four times a day (QID) | RECTAL | Status: DC | PRN

## 2020-03-26 MED ORDER — GLYCOPYRROLATE 1 MG PO TABS: 1.0000 mg | ORAL_TABLET | ORAL | Status: DC | PRN

## 2020-03-26 MED ORDER — HALOPERIDOL LACTATE 2 MG/ML PO CONC: 0.5000 mg | ORAL | Status: DC | PRN

## 2020-03-26 MED ORDER — ACETAMINOPHEN 325 MG PO TABS: 650.0000 mg | ORAL_TABLET | Freq: Four times a day (QID) | ORAL | Status: DC | PRN

## 2020-03-26 MED ORDER — LORAZEPAM 2 MG/ML IJ SOLN: 1.0000 mg | INTRAMUSCULAR | Status: DC | PRN

## 2020-03-26 MED ORDER — GLYCOPYRROLATE 0.2 MG/ML IJ SOLN: 0.2000 mg | INTRAMUSCULAR | Status: DC | PRN

## 2020-03-26 MED ORDER — HALOPERIDOL 0.5 MG PO TABS: 0.5000 mg | ORAL_TABLET | ORAL | Status: DC | PRN

## 2020-03-26 MED ORDER — BIOTENE DRY MOUTH MT LIQD: 15.0000 mL | OROMUCOSAL | Status: DC | PRN

## 2020-03-26 MED ORDER — LORAZEPAM 2 MG/ML PO CONC: 1.0000 mg | ORAL | Status: DC | PRN

## 2020-03-26 MED ORDER — LORAZEPAM 1 MG PO TABS: 1.0000 mg | ORAL_TABLET | ORAL | Status: DC | PRN

## 2020-03-26 MED ORDER — ONDANSETRON 4 MG PO TBDP: 4.0000 mg | ORAL_TABLET | Freq: Four times a day (QID) | ORAL | Status: DC | PRN

## 2020-03-26 MED ORDER — MORPHINE SULFATE (PF) 2 MG/ML IV SOLN: 2.0000 mg | INTRAVENOUS | Status: DC | PRN

## 2020-03-26 NOTE — H&P (Signed)
History and Physical    Paul Perez ZJI:[id number] DOB: 17-Dec-1927 DOA: 03/26/2020  PCP: Alanson Puls [facility]  Patient coming from: Skilled nursing facility  I have personally briefly reviewed patient's old medical records in [place]  Chief Complaint: GIP status  HPI: Paul Perez is a 86 y.o. male with medical history significant of multiple medical problems including dementia, chronic kidney disease stage IV, diabetes, overall failure to thrive, admitted to the hospital with fever, pneumonia, UTI. He also has a large sacral decubitus ulcer. Patient' was found to have Pseudomonas bacteremia. With multiple medical problems and poor prognosis, he was seen by palliative care with decision to transition to comfort measures. Seen by hospice and has been accepted to residential hospice.  Review of Systems: Patient is lethargic   Past Medical History:  Diagnosis Date  . Dementia ([place])   . Dementia ([place])   . Diabetes mellitus without complication ([place])   . Dysphagia   . Encephalopathy   . Gastritis 2004  . GIB (gastrointestinal bleeding) 2004  . Hypertension   . Poor historian   . Renal disorder   . Sinus bradycardia seen on cardiac monitor     Past Surgical History:  Procedure Laterality Date  . INGUINAL HERNIA REPAIR  09/2010   Bilateral  . UPPER GASTROINTESTINAL ENDOSCOPY  2004   [place]hman: Duodenal/bulbar ulcer with arterial spurting.  Status post therapeutic measures.  Gastritis with duodenitis.  Bulbar pseudodiverticulum.    Social History:  reports that he has never smoked. He has never used smokeless tobacco. He reports that he does not drink alcohol and does not use drugs.  No Known Allergies  Family History  Problem Relation Age of Onset  . Diabetes Mother   . Diabetes Sister     Prior to Admission medications   Not on File    Physical Exam: Vitals:   03/26/20 1400 03/26/20 1459 03/26/20 1856  BP: (!) 195/80  (!) 197/72  Pulse: 66  76    Resp: 16    Temp:  98 F (36.7 C)   TempSrc:  Axillary     Constitutional: NAD, calm, comfortable Neck: normal, supple, no masses, no thyromegaly Respiratory: clear to auscultation bilaterally, no wheezing, no crackles. Normal respiratory effort. No accessory muscle use.     Labs on Admission: I have personally reviewed following labs and imaging studies  CBC: Recent Labs  Lab 03/20/20 0923 03/20/20 1447 03/21/20 1233 03/21/20 2216 03/22/20 1102 03/23/20 0731 03/24/20 0522  WBC 8.0  --   --   --  7.4  --  8.0  HGB 9.2*   < > 8.7* 8.7* 9.2*  9.2* 9.6* 8.9*  HCT 30.6*   < > 29.5* 29.6* 31.3*  30.8* 32.0* 30.6*  MCV 93.3  --   --   --  93.7  --  93.9  PLT 218  --   --   --  157  --  156   < > = values in this interval not displayed.   Basic Metabolic Panel: Recent Labs  Lab 03/21/20 2217 03/22/20 0605 03/22/20 1414 03/22/20 1600 03/23/20 0731 03/24/20 0522  NA 152* 152* 149*  --  148* 143  K 3.3* 3.4* 3.0*  --  3.2* 3.5  CL 126* 124* 123*  --  119* 114*  CO2 17* 20* 20*  --  21* 20*  GLUCOSE 312* 208* 214*  --  153* 303*  BUN 45* 39* 36*  --  30* 28*  CREATININE 2.23* 2.11* 2.03*  --  1.78* 1.86*  CALCIUM 7.9* 8.0* 7.8*  --  8.1* 7.9*  MG  --   --   --  2.4  --   --    GFR: Estimated Creatinine Clearance: 29 mL/min (A) (by C-G formula based on SCr of 1.86 mg/dL (H)). Liver Function Tests: No results for input(s): AST, ALT, ALKPHOS, BILITOT, PROT, ALBUMIN in the last 168 hours. No results for input(s): LIPASE, AMYLASE in the last 168 hours. No results for input(s): AMMONIA in the last 168 hours. Coagulation Profile: No results for input(s): INR, PROTIME in the last 168 hours. Cardiac Enzymes: No results for input(s): CKTOTAL, CKMB, CKMBINDEX, TROPONINI in the last 168 hours. BNP (last 3 results) No results for input(s): PROBNP in the last 8760 hours. HbA1C: No results for input(s): HGBA1C in the last 72 hours. CBG: Recent Labs  Lab 03/25/20 2011  03/25/20 2358 03/26/20 0421 03/26/20 0743 03/26/20 1137  GLUCAP 160* 206* 198* 162* 115*   Lipid Profile: No results for input(s): CHOL, HDL, LDLCALC, TRIG, CHOLHDL, LDLDIRECT in the last 72 hours. Thyroid Function Tests: No results for input(s): TSH, T4TOTAL, FREET4, T3FREE, THYROIDAB in the last 72 hours. Anemia Panel: No results for input(s): VITAMINB12, FOLATE, FERRITIN, TIBC, IRON, RETICCTPCT in the last 72 hours. Urine analysis:    Component Value Date/Time   COLORURINE YELLOW 03/19/2020 0710   APPEARANCEUR CLEAR 03/19/2020 0710   LABSPEC 1.016 03/19/2020 0710   PHURINE 6.0 03/19/2020 0710   GLUCOSEU >=500 (A) 03/19/2020 0710   HGBUR MODERATE (A) 03/19/2020 0710   BILIRUBINUR NEGATIVE 03/19/2020 0710   KETONESUR NEGATIVE 03/19/2020 0710   PROTEINUR 30 (A) 03/19/2020 0710   UROBILINOGEN 0.2 08/20/2013 1820   NITRITE POSITIVE (A) 03/19/2020 0710   LEUKOCYTESUR MODERATE (A) 03/19/2020 0710    Radiological Exams on Admission: US Venous Img Upper Uni Right(DVT)  Result Date: 03/25/2020 CLINICAL DATA:  Right upper extremity edema. EXAM: RIGHT UPPER EXTREMITY VENOUS DOPPLER ULTRASOUND TECHNIQUE: Gray-scale sonography with graded compression, as well as color Doppler and duplex ultrasound were performed to evaluate the upper extremity deep venous system from the level of the subclavian vein and including the jugular, axillary, basilic, radial, ulnar and upper cephalic vein. Spectral Doppler was utilized to evaluate flow at rest and with distal augmentation maneuvers. COMPARISON:  None. FINDINGS: Contralateral Subclavian Vein: Respiratory phasicity is normal and symmetric with the symptomatic side. No evidence of thrombus. Normal compressibility. Internal Jugular Vein: No evidence of thrombus. Normal compressibility, respiratory phasicity and response to augmentation. Subclavian Vein: No evidence of thrombus. Normal compressibility, respiratory phasicity and response to augmentation.  Axillary Vein: No evidence of thrombus. Normal compressibility, respiratory phasicity and response to augmentation. Cephalic Vein: The visualized fell vein demonstrates occlusive thrombus and is noncompressible. Basilic Vein: The basilic vein is not identified Brachial Veins: No evidence of thrombus. Normal compressibility, respiratory phasicity and response to augmentation. Radial Veins: No evidence of thrombus. Normal compressibility, respiratory phasicity and response to augmentation. Ulnar Veins: No evidence of thrombus. Normal compressibility, respiratory phasicity and response to augmentation. Venous Reflux:  None visualized. Other Findings:  No abnormal fluid collections. IMPRESSION: 1. Superficial thrombophlebitis within the cephalic vein in the right arm. 2. No evidence of right upper extremity deep vein thrombosis. Electronically Signed   By: Aletta Edouard M.D.   On: 03/25/2020 09:30     Assessment/Plan Active Problems:   Bacteremia due to Pseudomonas   86 year old male with multiple medical problems from skilled nursing facility, admitted  with sepsis with Pseudomonas bacteremia. Overall prognosis is poor. Seen by palliative care and family has elected to transition to residential hospice. Currently GIP status awaiting a bed at residential hospice.    Kathie Dike MD [facility]   If 7PM-7AM, please contact night-coverage [email or web address]   03/26/2020, 9:09 PM

## 2020-03-26 NOTE — Discharge Summary (Signed)
Physician Discharge Summary  Paul Perez ALP:[id number] DOB: 30-Jul-1927 DOA: 03/18/2020  PCP: Paul Perez [facility] date: 03/18/2020 Discharge date: 03/26/2020  Admitted From: Skilled nursing facility Disposition: Residential hospice  Recommendations for Outpatient Follow-up:  1. Patient is currently GIP status, awaiting a bed at residential hospice  Discharge Condition: Terminal CODE STATUS: DNR, comfort measures Diet recommendation: Regular diet for comfort  Brief/Interim Summary: 86 year old male with history of diabetes, hypertension, chronic kidney disease stage IV, dementia, who is a resident of a skilled nursing facility, is brought to the hospital with worsening confusion, fever and elevated blood sugars. He was found to have right-sided pneumonia and urinary tract infection. He was admitted for sepsis. He was also noted to have acute kidney injury, hypernatremia and anemia with FOBT positive stools. GI was consulted and recommended PRBC transfusions and conservative management. In light of his multiple medical problems and overall poor prognosis, palliative care was consulted to assist with goals of care. Patient was also noted to have Pseudomonas bacteremia and was treated with IV antibiotics. After discussions with family, it was decided to transition the patient to comfort care. He was evaluated by hospice and has been accepted under GIP status. He awaits a bed at residential hospice  Discharge Diagnoses:  Principal Problem:   Severe sepsis (Paul Perez) Active Problems:   Hypertension   Hypernatremia   Diabetes mellitus type 2, uncontrolled, with complications (HCC)   Dehydration   CKD (chronic kidney disease) stage 4, GFR 15-29 ml/min (HCC)   Acute febrile illness   Hyperosmolar hyperglycemic state (HHS) (HCC)   GI bleed   Sacral decubitus ulcer, stage II (Paul [place])   Abrasion of left leg   Scrotal skin lesion   Hypoalbuminemia   Acute metabolic encephalopathy    Community acquired pneumonia   Severe sepsis with septic shock (Paul [place])   Encounter for hospice care discussion    Discharge Instructions  Discharge Instructions    Diet - low sodium heart healthy   Complete by: As directed    Increase activity slowly   Complete by: As directed    No wound care   Complete by: As directed      Allergies as of 03/26/2020   No Known Allergies     Medication List    STOP taking these medications   acetaminophen 650 MG CR tablet Commonly known as: TYLENOL   Admelog 100 UNIT/ML injection Generic drug: insulin lispro   amLODipine 5 MG tablet Commonly known as: NORVASC   busPIRone 5 MG tablet Commonly known as: BUSPAR   cloNIDine 0.1 mg/24hr patch Commonly known as: CATAPRES - Dosed in mg/24 hr   donepezil 5 MG tablet Commonly known as: ARICEPT   food thickener Powd Commonly known as: THICK IT   iron polysaccharides 150 MG capsule Commonly known as: NIFEREX   melatonin 3 MG Tabs tablet   metoprolol tartrate 25 MG tablet Commonly known as: LOPRESSOR   pioglitazone 15 MG tablet Commonly known as: ACTOS   polyethylene glycol 17 g packet Commonly known as: MIRALAX / GLYCOLAX   QUEtiapine 25 MG tablet Commonly known as: SEROQUEL   senna 8.6 MG Tabs tablet Commonly known as: SENOKOT   Venelex Oint   Vitamin D (Cholecalciferol) 25 MCG (1000 UT) Tabs       No Known Allergies  Consultations:  Palliative care   Procedures/Studies: DG Chest 1 View  Result Date: 03/18/2020 CLINICAL DATA:  Altered mental status, elevated glucose EXAM: CHEST  1 VIEW COMPARISON:  Radiograph 02/26/2020 FINDINGS: More coalescent patchy opacities seen in the right medial lung base/infrahilar lung. Additional atelectatic changes and vascular crowding are present as well. Vascularity remains fairly well-defined. No pneumothorax or effusion. The aorta is calcified. The remaining cardiomediastinal contours are unremarkable. No acute osseous or soft  tissue abnormality. Degenerative changes are present in the imaged spine and shoulders. Telemetry leads overlie the chest. IMPRESSION: More coalescent patchy opacities in the right medial lung base/infrahilar lung could reflect pneumonia in the appropriate clinical setting. Additional atelectatic changes elsewhere. Aortic Atherosclerosis (ICD10-I70.0). Electronically Signed   By: Lovena Le M.D.   On: 03/18/2020 19:22   DG Abd 1 View  Result Date: 03/01/2020 CLINICAL DATA:  Abdominal distension EXAM: ABDOMEN - 1 VIEW COMPARISON:  01/04/2006 FINDINGS: Scattered large and small bowel gas is noted. Contrast material is noted in the colon from a prior modified barium swallow. No free air is seen. No obstructive changes are noted. Degenerative changes of lumbar spine are seen. IMPRESSION: No acute abnormality noted. Electronically Signed   By: Inez Catalina M.D.   On: 03/01/2020 15:51   US Venous Img Upper Uni Right(DVT)  Result Date: 03/25/2020 CLINICAL DATA:  Right upper extremity edema. EXAM: RIGHT UPPER EXTREMITY VENOUS DOPPLER ULTRASOUND TECHNIQUE: Gray-scale sonography with graded compression, as well as color Doppler and duplex ultrasound were performed to evaluate the upper extremity deep venous system from the level of the subclavian vein and including the jugular, axillary, basilic, radial, ulnar and upper cephalic vein. Spectral Doppler was utilized to evaluate flow at rest and with distal augmentation maneuvers. COMPARISON:  None. FINDINGS: Contralateral Subclavian Vein: Respiratory phasicity is normal and symmetric with the symptomatic side. No evidence of thrombus. Normal compressibility. Internal Jugular Vein: No evidence of thrombus. Normal compressibility, respiratory phasicity and response to augmentation. Subclavian Vein: No evidence of thrombus. Normal compressibility, respiratory phasicity and response to augmentation. Axillary Vein: No evidence of thrombus. Normal compressibility,  respiratory phasicity and response to augmentation. Cephalic Vein: The visualized fell vein demonstrates occlusive thrombus and is noncompressible. Basilic Vein: The basilic vein is not identified Brachial Veins: No evidence of thrombus. Normal compressibility, respiratory phasicity and response to augmentation. Radial Veins: No evidence of thrombus. Normal compressibility, respiratory phasicity and response to augmentation. Ulnar Veins: No evidence of thrombus. Normal compressibility, respiratory phasicity and response to augmentation. Venous Reflux:  None visualized. Other Findings:  No abnormal fluid collections. IMPRESSION: 1. Superficial thrombophlebitis within the cephalic vein in the right arm. 2. No evidence of right upper extremity deep vein thrombosis. Electronically Signed   By: Aletta Edouard M.D.   On: 03/25/2020 09:30   DG CHEST PORT 1 VIEW  Result Date: 02/26/2020 CLINICAL DATA:  Possible aspiration EXAM: PORTABLE CHEST 1 VIEW COMPARISON:  02/22/2020 FINDINGS: Cardiac shadow is stable. Aortic calcifications are again seen. The lungs are well aerated bilaterally. No focal infiltrate or sizable effusion is seen. No bony abnormality is noted. IMPRESSION: No acute abnormality seen.  No change from the prior exam. Electronically Signed   By: Inez Catalina M.D.   On: 02/26/2020 17:41   DG Swallowing Func-Speech Pathology  Result Date: 02/26/2020 Objective Swallowing Evaluation: Type of Study: MBS-Modified Barium Swallow Study  Patient Details Name: CONSTANTINO STARACE MRN: [id number] Date of Birth: [birth year] Today's Date: 02/26/2020 Time: SLP Start Time (ACUTE ONLY): 1339 -SLP Stop Time (ACUTE ONLY): 1408 SLP Time Calculation (min) (ACUTE ONLY): 29 min Past Medical History: Past Medical History: Diagnosis Date . Dementia ([place])  . Diabetes mellitus without  complication ([place])  . Encephalopathy  . Gastritis 2004 . GIB (gastrointestinal bleeding) 2004 . Hypertension  . Poor historian  . Sinus bradycardia  seen on cardiac monitor  Past Surgical History: Past Surgical History: Procedure Laterality Date . INGUINAL HERNIA REPAIR  09/2010  Bilateral . UPPER GASTROINTESTINAL ENDOSCOPY  2004 HPI: This 86 y.o.malewith medical history significant ofDementia,DiabetesMellitus II, Hypertension, CKD stage IV, premature atrial contractions,history of GI bleed, presents in the emergency department with confusion and fever. This is patient's third ER visit In last three weeks.Last week he presented in the ED with generalized weakness and pain,found to have a UTI . He was discharged on Keflex. Daughter reportshe has been having worsening pain and weakness, was not able to stand up in the morning. Patient is admitted for sepsis secondary to Pseudomonas bacteremia.  He was also found to have high anion gap metabolic acidosis secondary to DKA which has been resolved. He is transitioned to subcu insulin. BSE requested.  Subjective: "I want water!" Assessment / Plan / Recommendation CHL IP CLINICAL IMPRESSIONS 02/26/2020 Clinical Impression Pt presents with mild oropharyngeal sensorimotor dysphagia characterized by decreased laryngeal vestibule closure resulting in two episodes of trace silent aspiration of thin liquids via straw and consistent penetration of thin liquids and NTL during the swallow that is usually expelled with repeat swallow of solid textures. Decreased pharyngeal constriction resulting in inconsistent clearance of penetrates however despite remaining in the airway, they were only visualized dropping to and below the cords twice as mentioned above. Solid textures were consumed without incident and the barium tablet was swallowed with thin liquids with noted brief stasis in the pyriforms but quickly passed through. RN was present for MBS and BP and HR were stable throughout procedure. Recommend D3/mech soft and Nectar thick liquids; recommend meds whole or crushed in puree as Pt is able. Also recommend free  water protocol, * single ice chips after oral care in between meals *. ST will continue to follow acutely. SLP Visit Diagnosis Dysphagia, unspecified (R13.10) Attention and concentration deficit following -- Frontal lobe and executive function deficit following -- Impact on safety and function Mild aspiration risk;Moderate aspiration risk   CHL IP TREATMENT RECOMMENDATION 02/26/2020 Treatment Recommendations Therapy as outlined in treatment plan below   Prognosis 02/26/2020 Prognosis for Safe Diet Advancement Fair Barriers to Reach Goals Cognitive deficits Barriers/Prognosis Comment -- CHL IP DIET RECOMMENDATION 02/26/2020 SLP Diet Recommendations Dysphagia 3 (Mech soft) solids;Nectar thick liquid Liquid Administration via Cup;No straw Medication Administration Whole meds with puree Compensations Slow rate;Small sips/bites Postural Changes Remain semi-upright after after feeds/meals (Comment);Seated upright at 90 degrees   CHL IP OTHER RECOMMENDATIONS 02/26/2020 Recommended Consults -- Oral Care Recommendations Oral care BID Other Recommendations Order thickener from pharmacy   CHL IP FOLLOW UP RECOMMENDATIONS 02/26/2020 Follow up Recommendations 24 hour supervision/assistance   CHL IP FREQUENCY AND DURATION 02/26/2020 Speech Therapy Frequency (ACUTE ONLY) min 2x/week Treatment Duration 1 week      CHL IP ORAL PHASE 02/26/2020 Oral Phase WFL Oral - Pudding Teaspoon -- Oral - Pudding Cup -- Oral - Honey Teaspoon -- Oral - Honey Cup -- Oral - Nectar Teaspoon -- Oral - Nectar Cup -- Oral - Nectar Straw -- Oral - Thin Teaspoon -- Oral - Thin Cup -- Oral - Thin Straw -- Oral - Puree -- Oral - Mech Soft -- Oral - Regular -- Oral - Multi-Consistency -- Oral - Pill -- Oral Phase - Comment --  CHL IP PHARYNGEAL PHASE 02/26/2020 Pharyngeal Phase Impaired Pharyngeal-  Pudding Teaspoon -- Pharyngeal -- Pharyngeal- Pudding Cup -- Pharyngeal -- Pharyngeal- Honey Teaspoon Pharyngeal residue - valleculae;Pharyngeal residue -  pyriform;Reduced pharyngeal peristalsis Pharyngeal -- Pharyngeal- Honey Cup -- Pharyngeal -- Pharyngeal- Nectar Teaspoon Reduced pharyngeal peristalsis;Reduced airway/laryngeal closure;Penetration/Aspiration during swallow;Pharyngeal residue - valleculae;Pharyngeal residue - pyriform Pharyngeal Material enters airway, remains ABOVE vocal cords then ejected out Pharyngeal- Nectar Cup Reduced pharyngeal peristalsis;Reduced airway/laryngeal closure;Penetration/Aspiration during swallow;Pharyngeal residue - valleculae;Pharyngeal residue - pyriform Pharyngeal Material enters airway, remains ABOVE vocal cords then ejected out Pharyngeal- Nectar Straw -- Pharyngeal -- Pharyngeal- Thin Teaspoon Delayed swallow initiation-pyriform sinuses;Reduced pharyngeal peristalsis;Reduced epiglottic inversion;Reduced anterior laryngeal mobility;Reduced laryngeal elevation;Reduced airway/laryngeal closure;Reduced tongue base retraction;Penetration/Aspiration during swallow;Pharyngeal residue - valleculae;Pharyngeal residue - pyriform Pharyngeal Material enters airway, remains ABOVE vocal cords and not ejected out Pharyngeal- Thin Cup Delayed swallow initiation-pyriform sinuses;Reduced pharyngeal peristalsis;Reduced epiglottic inversion;Reduced anterior laryngeal mobility;Reduced laryngeal elevation;Reduced airway/laryngeal closure;Reduced tongue base retraction;Penetration/Aspiration during swallow;Pharyngeal residue - valleculae;Pharyngeal residue - pyriform Pharyngeal Material enters airway, remains ABOVE vocal cords and not ejected out Pharyngeal- Thin Straw Delayed swallow initiation-pyriform sinuses;Reduced pharyngeal peristalsis;Reduced epiglottic inversion;Reduced anterior laryngeal mobility;Reduced laryngeal elevation;Reduced airway/laryngeal closure;Reduced tongue base retraction;Penetration/Aspiration during swallow;Pharyngeal residue - valleculae;Pharyngeal residue - pyriform Pharyngeal Material enters airway, passes BELOW  cords without attempt by patient to eject out (silent aspiration) Pharyngeal- Puree WFL Pharyngeal -- Pharyngeal- Mechanical Soft -- Pharyngeal -- Pharyngeal- Regular WFL Pharyngeal -- Pharyngeal- Multi-consistency -- Pharyngeal -- Pharyngeal- Pill Pharyngeal residue - cp segment;Pharyngeal residue - pyriform Pharyngeal -- Pharyngeal Comment --  CHL IP CERVICAL ESOPHAGEAL PHASE 02/26/2020 Cervical Esophageal Phase WFL Pudding Teaspoon -- Pudding Cup -- Honey Teaspoon -- Honey Cup -- Nectar Teaspoon -- Nectar Cup -- Nectar Straw -- Thin Teaspoon -- Thin Cup -- Thin Straw -- Puree -- Mechanical Soft -- Regular -- Multi-consistency -- Pill -- Cervical Esophageal Comment -- Amelia H. Roddie Mc, CCC-SLP Speech Language Pathologist Wende Bushy 02/26/2020, 4:49 PM                  Subjective: Somnolent, says he hurts all over  Discharge Exam: Vitals:   03/25/20 2200 03/26/20 0500 03/26/20 0830 03/26/20 2015  BP: 130/88 (!) 160/68 (!) 186/74 (!) 176/90  Pulse: 78 76 66 75  Resp: 20 20 18 18   Temp: 98.5 F (36.9 C) 98.9 F (37.2 C) 97.8 F (36.6 C) 98.2 F (36.8 C)  TempSrc: Axillary Axillary Oral Oral  SpO2: 96% 100% 100% 100%  Weight:      Height:        General: Somnolent Cardiovascular: RRR, S1/S2 +, no rubs, no gallops Respiratory: CTA bilaterally, no wheezing, no rhonchi     The results of significant diagnostics from this hospitalization (including imaging, microbiology, ancillary and laboratory) are listed below for reference.     Microbiology: Recent Results (from the past 240 hour(s))  Respiratory Panel by RT PCR (Flu A&B, Covid) - Nasopharyngeal Swab     Status: None   Collection Time: 03/18/20  6:58 PM   Specimen: Nasopharyngeal Swab  Result Value Ref Range Status   SARS Coronavirus 2 by RT PCR NEGATIVE NEGATIVE Final    Comment: (NOTE) SARS-CoV-2 target nucleic acids are NOT DETECTED.  The SARS-CoV-2 RNA is generally detectable in upper  respiratoy specimens during the acute phase of infection. The lowest concentration of SARS-CoV-2 viral copies this assay can detect is 131 copies/mL. A negative result does not preclude SARS-Cov-2 infection and should not be used as the sole basis for treatment or other patient management decisions. A negative  result may occur with  improper specimen collection/handling, submission of specimen other than nasopharyngeal swab, presence of viral mutation(s) within the areas targeted by this assay, and inadequate number of viral copies (<131 copies/mL). A negative result must be combined with clinical observations, patient history, and epidemiological information. The expected result is Negative.  Fact Sheet for Patients:  PinkCheek.be  Fact Sheet for Healthcare Providers:  GravelBags.it  This test is no t yet approved or cleared by the [place] FDA and  has been authorized for detection and/or diagnosis of SARS-CoV-2 by FDA under an Emergency Use Authorization (EUA). This EUA will remain  in effect (meaning this test can be used) for the duration of the COVID-19 declaration under Section 564(b)(1) of the Act, 21 U.S.C. section 360bbb-3(b)(1), unless the authorization is terminated or revoked sooner.     Influenza A by PCR NEGATIVE NEGATIVE Final   Influenza B by PCR NEGATIVE NEGATIVE Final    Comment: (NOTE) The Xpert Xpress SARS-CoV-2/FLU/RSV assay is intended as an aid in  the diagnosis of influenza from Nasopharyngeal swab specimens and  should not be used as a sole basis for treatment. Nasal washings and  aspirates are unacceptable for Xpert Xpress SARS-CoV-2/FLU/RSV  testing.  Fact Sheet for Patients: PinkCheek.be  Fact Sheet for Healthcare Providers: GravelBags.it  This test is not yet approved or cleared by the [place] FDA and  has been  authorized for detection and/or diagnosis of SARS-CoV-2 by  FDA under an Emergency Use Authorization (EUA). This EUA will remain  in effect (meaning this test can be used) for the duration of the  Covid-19 declaration under Section 564(b)(1) of the Act, 21  U.S.C. section 360bbb-3(b)(1), unless the authorization is  terminated or revoked. Performed at [facility], [address] [place], [place] [address]   Culture, blood (Routine x 2)     Status: Abnormal   Collection Time: 03/18/20  7:39 PM   Specimen: BLOOD RIGHT ARM  Result Value Ref Range Status   Specimen Description BLOOD RIGHT ARM  Final   Special Requests   Final    BOTTLES DRAWN AEROBIC AND ANAEROBIC Blood Culture adequate volume   Culture  Setup Time   Final    GRAM POSITIVE RODS Gram Stain Report Called to,Read Back By and Verified With: KATIE SITES @1257  03/19/20 BY JONES,T  ANAEROBIC BOTTLE ONLY [facility] GRAM NEGATIVE RODS AEROBIC BOTTLE ONLY CRITICAL RESULT CALLED TO, READ BACK BY AND VERIFIED WITH: PHARMD J.MENTENHALL AT 1012 ON 03/20/2020 BY T.SAAD    Culture CLOSTRIDIUM RAMOSUM PSEUDOMONAS AERUGINOSA  (A)  Final   Report Status 03/22/2020 FINAL  Final   Organism ID, Bacteria PSEUDOMONAS AERUGINOSA  Final      Susceptibility   Pseudomonas aeruginosa - MIC*    CEFTAZIDIME 4 SENSITIVE Sensitive     CIPROFLOXACIN <=0.25 SENSITIVE Sensitive     GENTAMICIN <=1 SENSITIVE Sensitive     IMIPENEM 2 SENSITIVE Sensitive     PIP/TAZO 16 SENSITIVE Sensitive     CEFEPIME Value in next row Sensitive      SENSITIVEPerformed at [facility] [address] [place], [place] [address]    * PSEUDOMONAS AERUGINOSA  Culture, blood (Routine x 2)     Status: Abnormal   Collection Time: 03/18/20  7:39 PM   Specimen: BLOOD RIGHT ARM  Result Value Ref Range Status   Specimen Description   Final    BLOOD RIGHT ARM Performed at [facility], [address], [place], [place] [address]  Special Requests   Final    BOTTLES DRAWN  AEROBIC AND ANAEROBIC Blood Culture adequate volume Performed at [facility], [address], [place], [place] [address]    Culture  Setup Time   Final    GRAM NEGATIVE RODS AEROBIC BOTTLE Gram Stain Report Called to,Read Back By and Verified With: PARSONS,L AT 5701 ON 03/20/20 BY HUFFINES,S. Performed at [facility], [address] [place], [place] [address]    Culture (A)  Final    PSEUDOMONAS AERUGINOSA SUSCEPTIBILITIES PERFORMED ON PREVIOUS CULTURE WITHIN THE LAST 5 DAYS. Performed at [facility], [place] [address] [place], [place] [address]    Report Status 03/22/2020 FINAL  Final  Blood Culture ID Panel (Reflexed)     Status: Abnormal   Collection Time: 03/18/20  7:39 PM  Result Value Ref Range Status   Enterococcus faecalis NOT DETECTED NOT DETECTED Final   Enterococcus Faecium NOT DETECTED NOT DETECTED Final   Listeria monocytogenes NOT DETECTED NOT DETECTED Final   Staphylococcus species NOT DETECTED NOT DETECTED Final   Staphylococcus aureus (BCID) NOT DETECTED NOT DETECTED Final   Staphylococcus epidermidis NOT DETECTED NOT DETECTED Final   Staphylococcus lugdunensis NOT DETECTED NOT DETECTED Final   Streptococcus species NOT DETECTED NOT DETECTED Final   Streptococcus agalactiae NOT DETECTED NOT DETECTED Final   Streptococcus pneumoniae NOT DETECTED NOT DETECTED Final   Streptococcus pyogenes NOT DETECTED NOT DETECTED Final   A.calcoaceticus-baumannii NOT DETECTED NOT DETECTED Final   Bacteroides fragilis NOT DETECTED NOT DETECTED Final   Enterobacterales NOT DETECTED NOT DETECTED Final   Enterobacter cloacae complex NOT DETECTED NOT DETECTED Final   Escherichia coli NOT DETECTED NOT DETECTED Final   Klebsiella aerogenes NOT DETECTED NOT DETECTED Final   Klebsiella oxytoca NOT DETECTED NOT DETECTED Final   Klebsiella pneumoniae NOT DETECTED NOT DETECTED Final   Proteus species NOT DETECTED NOT DETECTED Final   Salmonella species NOT DETECTED NOT DETECTED Final    Serratia marcescens NOT DETECTED NOT DETECTED Final   Haemophilus influenzae NOT DETECTED NOT DETECTED Final   Neisseria meningitidis NOT DETECTED NOT DETECTED Final   Pseudomonas aeruginosa DETECTED (A) NOT DETECTED Final    Comment: CRITICAL RESULT CALLED TO, READ BACK BY AND VERIFIED WITH: PHARM D J.MENTENHALL AT 1012 ON 03/20/2020 BY T.SAAD    Stenotrophomonas maltophilia NOT DETECTED NOT DETECTED Final   Candida albicans NOT DETECTED NOT DETECTED Final   Candida auris NOT DETECTED NOT DETECTED Final   Candida glabrata NOT DETECTED NOT DETECTED Final   Candida krusei NOT DETECTED NOT DETECTED Final   Candida parapsilosis NOT DETECTED NOT DETECTED Final   Candida tropicalis NOT DETECTED NOT DETECTED Final   Cryptococcus neoformans/gattii NOT DETECTED NOT DETECTED Final   CTX-M ESBL NOT DETECTED NOT DETECTED Final   Carbapenem resistance IMP NOT DETECTED NOT DETECTED Final   Carbapenem resistance KPC NOT DETECTED NOT DETECTED Final   Carbapenem resistance NDM NOT DETECTED NOT DETECTED Final   Carbapenem resistance VIM NOT DETECTED NOT DETECTED Final    Comment: Performed at [facility], [place] [address], [place], [place] [address]  Urine culture     Status: Abnormal   Collection Time: 03/19/20  7:10 AM   Specimen: In/Out Cath Urine  Result Value Ref Range Status   Specimen Description   Final    IN/OUT CATH URINE Performed at [facility], [address] [place], [place] [address]    Special Requests   Final    NONE Performed at  [facility], [address] [place], [place] [address]    Culture MULTIPLE SPECIES PRESENT, SUGGEST RECOLLECTION (A)  Final   Report Status 03/20/2020 FINAL  Final  Culture, blood (Routine X 2) w Reflex to ID Panel     Status: None (Preliminary result)   Collection Time: 03/23/20  2:48 PM   Specimen: BLOOD LEFT HAND  Result Value Ref Range Status   Specimen Description BLOOD LEFT HAND  Final   Special Requests   Final    BOTTLES DRAWN  AEROBIC AND ANAEROBIC Blood Culture adequate volume   Culture   Final    NO GROWTH 3 DAYS Performed at [facility], [address], [place], [place] [address]    Report Status PENDING  Incomplete  Culture, blood (Routine X 2) w Reflex to ID Panel     Status: None (Preliminary result)   Collection Time: 03/23/20  2:48 PM   Specimen: Left Antecubital; Blood  Result Value Ref Range Status   Specimen Description LEFT ANTECUBITAL  Final   Special Requests   Final    BOTTLES DRAWN AEROBIC ONLY Blood Culture adequate volume   Culture   Final    NO GROWTH 3 DAYS Performed at [facility], [address], [place], [place] [address]    Report Status PENDING  Incomplete     Labs: BNP (last 3 results) No results for input(s): BNP in the last 8760 hours. Basic Metabolic Panel: Recent Labs  Lab 03/21/20 2217 03/22/20 0605 03/22/20 1414 03/22/20 1600 03/23/20 0731 03/24/20 0522  NA 152* 152* 149*  --  148* 143  K 3.3* 3.4* 3.0*  --  3.2* 3.5  CL 126* 124* 123*  --  119* 114*  CO2 17* 20* 20*  --  21* 20*  GLUCOSE 312* 208* 214*  --  153* 303*  BUN 45* 39* 36*  --  30* 28*  CREATININE 2.23* 2.11* 2.03*  --  1.78* 1.86*  CALCIUM 7.9* 8.0* 7.8*  --  8.1* 7.9*  MG  --   --   --  2.4  --   --    Liver Function Tests: No results for input(s): AST, ALT, ALKPHOS, BILITOT, PROT, ALBUMIN in the last 168 hours. No results for input(s): LIPASE, AMYLASE in the last 168 hours. No results for input(s): AMMONIA in the last 168 hours. CBC: Recent Labs  Lab 03/20/20 0923 03/20/20 1447 03/21/20 1233 03/21/20 2216 03/22/20 1102 03/23/20 0731 03/24/20 0522  WBC 8.0  --   --   --  7.4  --  8.0  HGB 9.2*   < > 8.7* 8.7* 9.2*  9.2* 9.6* 8.9*  HCT 30.6*   < > 29.5* 29.6* 31.3*  30.8* 32.0* 30.6*  MCV 93.3  --   --   --  93.7  --  93.9  PLT 218  --   --   --  157  --  156   < > = values in this interval not displayed.   Cardiac Enzymes: No results for input(s): CKTOTAL, CKMB, CKMBINDEX,  TROPONINI in the last 168 hours. BNP: Invalid input(s): POCBNP CBG: Recent Labs  Lab 03/25/20 2011 03/25/20 2358 03/26/20 0421 03/26/20 0743 03/26/20 1137  GLUCAP 160* 206* 198* 162* 115*   D-Dimer No results for input(s): DDIMER in the last 72 hours. Hgb A1c No results for input(s): HGBA1C in the last 72 hours. Lipid Profile No results for input(s): CHOL, HDL, LDLCALC, TRIG, CHOLHDL, LDLDIRECT in the last 72 hours. Thyroid function  studies No results for input(s): TSH, T4TOTAL, T3FREE, THYROIDAB in the last 72 hours.  Invalid input(s): FREET3 Anemia work up No results for input(s): VITAMINB12, FOLATE, FERRITIN, TIBC, IRON, RETICCTPCT in the last 72 hours. Urinalysis    Component Value Date/Time   COLORURINE YELLOW 03/19/2020 0710   APPEARANCEUR CLEAR 03/19/2020 0710   LABSPEC 1.016 03/19/2020 0710   PHURINE 6.0 03/19/2020 0710   GLUCOSEU >=500 (A) 03/19/2020 0710   HGBUR MODERATE (A) 03/19/2020 0710   BILIRUBINUR NEGATIVE 03/19/2020 0710   KETONESUR NEGATIVE 03/19/2020 0710   PROTEINUR 30 (A) 03/19/2020 0710   UROBILINOGEN 0.2 08/20/2013 1820   NITRITE POSITIVE (A) 03/19/2020 0710   LEUKOCYTESUR MODERATE (A) 03/19/2020 0710   Sepsis Labs Invalid input(s): PROCALCITONIN,  WBC,  LACTICIDVEN Microbiology Recent Results (from the past 240 hour(s))  Respiratory Panel by RT PCR (Flu A&B, Covid) - Nasopharyngeal Swab     Status: None   Collection Time: 03/18/20  6:58 PM   Specimen: Nasopharyngeal Swab  Result Value Ref Range Status   SARS Coronavirus 2 by RT PCR NEGATIVE NEGATIVE Final    Comment: (NOTE) SARS-CoV-2 target nucleic acids are NOT DETECTED.  The SARS-CoV-2 RNA is generally detectable in upper respiratoy specimens during the acute phase of infection. The lowest concentration of SARS-CoV-2 viral copies this assay can detect is 131 copies/mL. A negative result does not preclude SARS-Cov-2 infection and should not be used as the sole basis for treatment  or other patient management decisions. A negative result may occur with  improper specimen collection/handling, submission of specimen other than nasopharyngeal swab, presence of viral mutation(s) within the areas targeted by this assay, and inadequate number of viral copies (<131 copies/mL). A negative result must be combined with clinical observations, patient history, and epidemiological information. The expected result is Negative.  Fact Sheet for Patients:  PinkCheek.be  Fact Sheet for Healthcare Providers:  GravelBags.it  This test is no t yet approved or cleared by the [place] FDA and  has been authorized for detection and/or diagnosis of SARS-CoV-2 by FDA under an Emergency Use Authorization (EUA). This EUA will remain  in effect (meaning this test can be used) for the duration of the COVID-19 declaration under Section 564(b)(1) of the Act, 21 U.S.C. section 360bbb-3(b)(1), unless the authorization is terminated or revoked sooner.     Influenza A by PCR NEGATIVE NEGATIVE Final   Influenza B by PCR NEGATIVE NEGATIVE Final    Comment: (NOTE) The Xpert Xpress SARS-CoV-2/FLU/RSV assay is intended as an aid in  the diagnosis of influenza from Nasopharyngeal swab specimens and  should not be used as a sole basis for treatment. Nasal washings and  aspirates are unacceptable for Xpert Xpress SARS-CoV-2/FLU/RSV  testing.  Fact Sheet for Patients: PinkCheek.be  Fact Sheet for Healthcare Providers: GravelBags.it  This test is not yet approved or cleared by the [place] FDA and  has been authorized for detection and/or diagnosis of SARS-CoV-2 by  FDA under an Emergency Use Authorization (EUA). This EUA will remain  in effect (meaning this test can be used) for the duration of the  Covid-19 declaration under Section 564(b)(1) of the Act, 21  U.S.C.  section 360bbb-3(b)(1), unless the authorization is  terminated or revoked. Performed at [facility], [address] [place], [place] [address]   Culture, blood (Routine x 2)     Status: Abnormal   Collection Time: 03/18/20  7:39 PM   Specimen: BLOOD RIGHT ARM  Result Value Ref Range Status  Specimen Description BLOOD RIGHT ARM  Final   Special Requests   Final    BOTTLES DRAWN AEROBIC AND ANAEROBIC Blood Culture adequate volume   Culture  Setup Time   Final    GRAM POSITIVE RODS Gram Stain Report Called to,Read Back By and Verified With: KATIE SITES @1257  03/19/20 BY JONES,T  ANAEROBIC BOTTLE ONLY [facility] GRAM NEGATIVE RODS AEROBIC BOTTLE ONLY CRITICAL RESULT CALLED TO, READ BACK BY AND VERIFIED WITH: PHARMD J.MENTENHALL AT 1012 ON 03/20/2020 BY T.SAAD    Culture CLOSTRIDIUM RAMOSUM PSEUDOMONAS AERUGINOSA  (A)  Final   Report Status 03/22/2020 FINAL  Final   Organism ID, Bacteria PSEUDOMONAS AERUGINOSA  Final      Susceptibility   Pseudomonas aeruginosa - MIC*    CEFTAZIDIME 4 SENSITIVE Sensitive     CIPROFLOXACIN <=0.25 SENSITIVE Sensitive     GENTAMICIN <=1 SENSITIVE Sensitive     IMIPENEM 2 SENSITIVE Sensitive     PIP/TAZO 16 SENSITIVE Sensitive     CEFEPIME Value in next row Sensitive      SENSITIVEPerformed at [facility] [address], [place], [place] [address]    * PSEUDOMONAS AERUGINOSA  Culture, blood (Routine x 2)     Status: Abnormal   Collection Time: 03/18/20  7:39 PM   Specimen: BLOOD RIGHT ARM  Result Value Ref Range Status   Specimen Description   Final    BLOOD RIGHT ARM Performed at [facility], [address], [place], [place] [address]    Special Requests   Final    BOTTLES DRAWN AEROBIC AND ANAEROBIC Blood Culture adequate volume Performed at [facility], [address], [place], [place] [address]    Culture  Setup Time   Final    GRAM NEGATIVE RODS AEROBIC BOTTLE Gram Stain Report Called to,Read Back By and Verified With: PARSONS,L AT  8563 ON 03/20/20 BY HUFFINES,S. Performed at [facility], [address], [place], [place] [address]    Culture (A)  Final    PSEUDOMONAS AERUGINOSA SUSCEPTIBILITIES PERFORMED ON PREVIOUS CULTURE WITHIN THE LAST 5 DAYS. Performed at [facility], [place] [address] [place],  [address]    Report Status 03/22/2020 FINAL  Final  Blood Culture ID Panel (Reflexed)     Status: Abnormal   Collection Time: 03/18/20  7:39 PM  Result Value Ref Range Status   Enterococcus faecalis NOT DETECTED NOT DETECTED Final   Enterococcus Faecium NOT DETECTED NOT DETECTED Final   Listeria monocytogenes NOT DETECTED NOT DETECTED Final   Staphylococcus species NOT DETECTED NOT DETECTED Final   Staphylococcus aureus (BCID) NOT DETECTED NOT DETECTED Final   Staphylococcus epidermidis NOT DETECTED NOT DETECTED Final   Staphylococcus lugdunensis NOT DETECTED NOT DETECTED Final   Streptococcus species NOT DETECTED NOT DETECTED Final   Streptococcus agalactiae NOT DETECTED NOT DETECTED Final   Streptococcus pneumoniae NOT DETECTED NOT DETECTED Final   Streptococcus pyogenes NOT DETECTED NOT DETECTED Final   A.calcoaceticus-baumannii NOT DETECTED NOT DETECTED Final   Bacteroides fragilis NOT DETECTED NOT DETECTED Final   Enterobacterales NOT DETECTED NOT DETECTED Final   Enterobacter cloacae complex NOT DETECTED NOT DETECTED Final   Escherichia coli NOT DETECTED NOT DETECTED Final   Klebsiella aerogenes NOT DETECTED NOT DETECTED Final   Klebsiella oxytoca NOT DETECTED NOT DETECTED Final   Klebsiella pneumoniae NOT DETECTED NOT DETECTED Final   Proteus species NOT DETECTED NOT DETECTED Final   Salmonella species NOT DETECTED NOT DETECTED Final   Serratia marcescens NOT DETECTED NOT  DETECTED Final   Haemophilus influenzae NOT DETECTED NOT DETECTED Final   Neisseria meningitidis NOT DETECTED NOT DETECTED Final   Pseudomonas aeruginosa DETECTED (A) NOT DETECTED Final    Comment: CRITICAL RESULT CALLED TO,  READ BACK BY AND VERIFIED WITH: PHARM D J.MENTENHALL AT 1012 ON 03/20/2020 BY T.SAAD    Stenotrophomonas maltophilia NOT DETECTED NOT DETECTED Final   Candida albicans NOT DETECTED NOT DETECTED Final   Candida auris NOT DETECTED NOT DETECTED Final   Candida glabrata NOT DETECTED NOT DETECTED Final   Candida krusei NOT DETECTED NOT DETECTED Final   Candida parapsilosis NOT DETECTED NOT DETECTED Final   Candida tropicalis NOT DETECTED NOT DETECTED Final   Cryptococcus neoformans/gattii NOT DETECTED NOT DETECTED Final   CTX-M ESBL NOT DETECTED NOT DETECTED Final   Carbapenem resistance IMP NOT DETECTED NOT DETECTED Final   Carbapenem resistance KPC NOT DETECTED NOT DETECTED Final   Carbapenem resistance NDM NOT DETECTED NOT DETECTED Final   Carbapenem resistance VIM NOT DETECTED NOT DETECTED Final    Comment: Performed at [facility], [place] [address], [place], [place] [address]  Urine culture     Status: Abnormal   Collection Time: 03/19/20  7:10 AM   Specimen: In/Out Cath Urine  Result Value Ref Range Status   Specimen Description   Final    IN/OUT CATH URINE Performed at [facility], [address] [place], [place] [address]    Special Requests   Final    NONE Performed at [facility], [address], [place], [place] [address]    Culture MULTIPLE SPECIES PRESENT, SUGGEST RECOLLECTION (A)  Final   Report Status 03/20/2020 FINAL  Final  Culture, blood (Routine X 2) w Reflex to ID Panel     Status: None (Preliminary result)   Collection Time: 03/23/20  2:48 PM   Specimen: BLOOD LEFT HAND  Result Value Ref Range Status   Specimen Description BLOOD LEFT HAND  Final   Special Requests   Final    BOTTLES DRAWN AEROBIC AND ANAEROBIC Blood Culture adequate volume   Culture   Final    NO GROWTH 3 DAYS Performed at [facility], [address] [place], [place] [address]    Report Status PENDING  Incomplete  Culture, blood (Routine X 2) w Reflex to ID Panel     Status: None  (Preliminary result)   Collection Time: 03/23/20  2:48 PM   Specimen: Left Antecubital; Blood  Result Value Ref Range Status   Specimen Description LEFT ANTECUBITAL  Final   Special Requests   Final    BOTTLES DRAWN AEROBIC ONLY Blood Culture adequate volume   Culture   Final    NO GROWTH 3 DAYS Performed at [facility], [address], [place],  [address]    Report Status PENDING  Incomplete     Time coordinating discharge: 77mins  SIGNED:   Kathie Dike, MD  [facility] 03/26/2020, 9:05 PM   If 7PM-7AM, please contact night-coverage [email or web address]

## 2020-03-26 NOTE — TOC Progression Note (Signed)
Transition of Care [facility]) - Progression Note    Patient Details  Name: Paul Perez MRN: [id number] Date of Birth: Aug 02, 1927  [facility]) CM/SW Contact  Boneta Lucks, RN Phone Number: 03/26/2020, 12:41 PM  Clinical Narrative:   Son signed consent with [facility]. Patient is now GIP.       Barriers to Discharge: Other (comment) (GIP)  Expected Discharge Plan and Services      [place] Date [place]: 03/25/20 Time HH Agency Contacted: 1000 Representative spoke with at [place]: [place]   Readmission Risk Interventions Readmission Risk Prevention Plan 03/19/2020  Transportation Screening Complete  Medication Review Press photographer) Complete  HRI or Home Care Consult Complete  SW Recovery Care/Counseling Consult Complete  Palliative Care Screening Complete  Skilled Nursing Facility Complete  Some recent data might be hidden

## 2020-03-27 LAB — SARS CORONAVIRUS 2 BY RT PCR (HOSPITAL ORDER, PERFORMED IN ~~LOC~~ HOSPITAL LAB): SARS Coronavirus 2: NEGATIVE

## 2020-03-27 MED ORDER — MORPHINE SULFATE (CONCENTRATE) 20 MG/ML PO SOLN: 10.0000 mg | ORAL | 0 refills | Status: AC | PRN

## 2020-03-27 MED ORDER — LORAZEPAM 1 MG PO TABS: 1.0000 mg | ORAL_TABLET | ORAL | 0 refills | Status: AC | PRN

## 2020-03-27 MED ORDER — ONDANSETRON 4 MG PO TBDP: 4.0000 mg | ORAL_TABLET | Freq: Four times a day (QID) | ORAL | 0 refills | Status: AC | PRN

## 2020-03-27 MED ORDER — ACETAMINOPHEN 325 MG PO TABS: 650.0000 mg | ORAL_TABLET | Freq: Four times a day (QID) | ORAL | Status: AC | PRN

## 2020-03-27 NOTE — Progress Notes (Signed)
Nutrition Brief Note  Pt identified on Malnutrition Screening Tool  Chart reviewed.  Pt has transitioned to comfort care. He is currently GIP awaiting a bed at residential hospice.  No nutrition interventions warranted at this time. Please consult as needed.   Lajuan Lines, RD, LDN Clinical Nutrition After Hours/Weekend Pager # in [place]

## 2020-03-27 NOTE — Discharge Summary (Signed)
Physician Discharge Summary  KEVON TENCH BZJ:[id number] DOB: 1927/10/20 DOA: 03/26/2020  PCP: Alanson Puls [facility] date: 03/26/2020 Discharge date: 03/27/2020  Admitted From: SNF Disposition:  Residential hospice  Recommendations for Outpatient Follow-up:  1. Patient is being discharged to residential hospice for end of life care  Discharge Condition:terminal CODE STATUS:DNR Diet recommendation: regular diet for comfort  Brief/Interim Summary: ARVLE GRABE is a 86 y.o. male with medical history significant of multiple medical problems including dementia, chronic kidney disease stage IV, diabetes, overall failure to thrive, admitted to the hospital with fever, pneumonia, UTI. He also has a large sacral decubitus ulcer. Patient was found to have Pseudomonas bacteremia. With multiple medical problems and poor prognosis, he was seen by palliative care with decision to transition to comfort measures. Seen by hospice and has been accepted to residential hospice.  Patient will be discharged to residential hospice today for end-of-life care.  Please see discharge summary done on 11/11 for further details regarding hospital course  Discharge Diagnoses:  Active Problems:   Bacteremia due to Pseudomonas    Discharge Instructions  Discharge Instructions    Diet - low sodium heart healthy   Complete by: As directed    Increase activity slowly   Complete by: As directed      Allergies as of 03/27/2020   No Known Allergies     Medication List    TAKE these medications   acetaminophen 325 MG tablet Commonly known as: TYLENOL Take 2 tablets (650 mg total) by mouth every 6 (six) hours as needed for mild pain (or Fever >/= 101).   LORazepam 1 MG tablet Commonly known as: ATIVAN Take 1 tablet (1 mg total) by mouth every 4 (four) hours as needed for anxiety.   morphine 20 MG/ML concentrated solution Commonly known as: ROXANOL Take 0.5 mLs (10 mg total) by mouth  every 2 (two) hours as needed for severe pain.   ondansetron 4 MG disintegrating tablet Commonly known as: ZOFRAN-ODT Take 1 tablet (4 mg total) by mouth every 6 (six) hours as needed for nausea.       No Known Allergies  Consultations:     Procedures/Studies: DG Chest 1 View  Result Date: 03/18/2020 CLINICAL DATA:  Altered mental status, elevated glucose EXAM: CHEST  1 VIEW COMPARISON:  Radiograph 02/26/2020 FINDINGS: More coalescent patchy opacities seen in the right medial lung base/infrahilar lung. Additional atelectatic changes and vascular crowding are present as well. Vascularity remains fairly well-defined. No pneumothorax or effusion. The aorta is calcified. The remaining cardiomediastinal contours are unremarkable. No acute osseous or soft tissue abnormality. Degenerative changes are present in the imaged spine and shoulders. Telemetry leads overlie the chest. IMPRESSION: More coalescent patchy opacities in the right medial lung base/infrahilar lung could reflect pneumonia in the appropriate clinical setting. Additional atelectatic changes elsewhere. Aortic Atherosclerosis (ICD10-I70.0). Electronically Signed   By: Lovena Le M.D.   On: 03/18/2020 19:22   DG Abd 1 View  Result Date: 03/01/2020 CLINICAL DATA:  Abdominal distension EXAM: ABDOMEN - 1 VIEW COMPARISON:  01/04/2006 FINDINGS: Scattered large and small bowel gas is noted. Contrast material is noted in the colon from a prior modified barium swallow. No free air is seen. No obstructive changes are noted. Degenerative changes of lumbar spine are seen. IMPRESSION: No acute abnormality noted. Electronically Signed   By: Inez Catalina M.D.   On: 03/01/2020 15:51   US Venous Img Upper Uni Right(DVT)  Result Date: 03/25/2020 CLINICAL DATA:  Right upper extremity edema. EXAM: RIGHT UPPER EXTREMITY VENOUS DOPPLER ULTRASOUND TECHNIQUE: Gray-scale sonography with graded compression, as well as color Doppler and duplex ultrasound  were performed to evaluate the upper extremity deep venous system from the level of the subclavian vein and including the jugular, axillary, basilic, radial, ulnar and upper cephalic vein. Spectral Doppler was utilized to evaluate flow at rest and with distal augmentation maneuvers. COMPARISON:  None. FINDINGS: Contralateral Subclavian Vein: Respiratory phasicity is normal and symmetric with the symptomatic side. No evidence of thrombus. Normal compressibility. Internal Jugular Vein: No evidence of thrombus. Normal compressibility, respiratory phasicity and response to augmentation. Subclavian Vein: No evidence of thrombus. Normal compressibility, respiratory phasicity and response to augmentation. Axillary Vein: No evidence of thrombus. Normal compressibility, respiratory phasicity and response to augmentation. Cephalic Vein: The visualized fell vein demonstrates occlusive thrombus and is noncompressible. Basilic Vein: The basilic vein is not identified Brachial Veins: No evidence of thrombus. Normal compressibility, respiratory phasicity and response to augmentation. Radial Veins: No evidence of thrombus. Normal compressibility, respiratory phasicity and response to augmentation. Ulnar Veins: No evidence of thrombus. Normal compressibility, respiratory phasicity and response to augmentation. Venous Reflux:  None visualized. Other Findings:  No abnormal fluid collections. IMPRESSION: 1. Superficial thrombophlebitis within the cephalic vein in the right arm. 2. No evidence of right upper extremity deep vein thrombosis. Electronically Signed   By: Aletta Edouard M.D.   On: 03/25/2020 09:30   DG CHEST PORT 1 VIEW  Result Date: 02/26/2020 CLINICAL DATA:  Possible aspiration EXAM: PORTABLE CHEST 1 VIEW COMPARISON:  02/22/2020 FINDINGS: Cardiac shadow is stable. Aortic calcifications are again seen. The lungs are well aerated bilaterally. No focal infiltrate or sizable effusion is seen. No bony abnormality is  noted. IMPRESSION: No acute abnormality seen.  No change from the prior exam. Electronically Signed   By: Inez Catalina M.D.   On: 02/26/2020 17:41       Subjective:   Discharge Exam: Vitals:   03/26/20 1400 03/26/20 1459 03/26/20 1856  BP: (!) 195/80  (!) 197/72  Pulse: 66  76  Resp: 16    Temp:  98 F (36.7 C)   TempSrc:  Axillary     General: Pt is alert, awake, not in acute distress Cardiovascular: RRR, S1/S2 +, no rubs, no gallops Respiratory: CTA bilaterally, no wheezing, no rhonchi     The results of significant diagnostics from this hospitalization (including imaging, microbiology, ancillary and laboratory) are listed below for reference.     Microbiology: Recent Results (from the past 240 hour(s))  Respiratory Panel by RT PCR (Flu A&B, Covid) - Nasopharyngeal Swab     Status: None   Collection Time: 03/18/20  6:58 PM   Specimen: Nasopharyngeal Swab  Result Value Ref Range Status   SARS Coronavirus 2 by RT PCR NEGATIVE NEGATIVE Final    Comment: (NOTE) SARS-CoV-2 target nucleic acids are NOT DETECTED.  The SARS-CoV-2 RNA is generally detectable in upper respiratoy specimens during the acute phase of infection. The lowest concentration of SARS-CoV-2 viral copies this assay can detect is 131 copies/mL. A negative result does not preclude SARS-Cov-2 infection and should not be used as the sole basis for treatment or other patient management decisions. A negative result may occur with  improper specimen collection/handling, submission of specimen other than nasopharyngeal swab, presence of viral mutation(s) within the areas targeted by this assay, and inadequate number of viral copies (<131 copies/mL). A negative result must be combined with clinical observations, patient history, and  epidemiological information. The expected result is Negative.  Fact Sheet for Patients:  PinkCheek.be  Fact Sheet for Healthcare Providers:   GravelBags.it  This test is no t yet approved or cleared by the [place] FDA and  has been authorized for detection and/or diagnosis of SARS-CoV-2 by FDA under an Emergency Use Authorization (EUA). This EUA will remain  in effect (meaning this test can be used) for the duration of the COVID-19 declaration under Section 564(b)(1) of the Act, 21 U.S.C. section 360bbb-3(b)(1), unless the authorization is terminated or revoked sooner.     Influenza A by PCR NEGATIVE NEGATIVE Final   Influenza B by PCR NEGATIVE NEGATIVE Final    Comment: (NOTE) The Xpert Xpress SARS-CoV-2/FLU/RSV assay is intended as an aid in  the diagnosis of influenza from Nasopharyngeal swab specimens and  should not be used as a sole basis for treatment. Nasal washings and  aspirates are unacceptable for Xpert Xpress SARS-CoV-2/FLU/RSV  testing.  Fact Sheet for Patients: PinkCheek.be  Fact Sheet for Healthcare Providers: GravelBags.it  This test is not yet approved or cleared by the [place] FDA and  has been authorized for detection and/or diagnosis of SARS-CoV-2 by  FDA under an Emergency Use Authorization (EUA). This EUA will remain  in effect (meaning this test can be used) for the duration of the  Covid-19 declaration under Section 564(b)(1) of the Act, 21  U.S.C. section 360bbb-3(b)(1), unless the authorization is  terminated or revoked. Performed at [facility], [address] [place], [place] [address]   Culture, blood (Routine x 2)     Status: Abnormal   Collection Time: 03/18/20  7:39 PM   Specimen: BLOOD RIGHT ARM  Result Value Ref Range Status   Specimen Description BLOOD RIGHT ARM  Final   Special Requests   Final    BOTTLES DRAWN AEROBIC AND ANAEROBIC Blood Culture adequate volume   Culture  Setup Time   Final    GRAM POSITIVE RODS Gram Stain Report Called to,Read Back By and Verified With: KATIE  SITES @1257  03/19/20 BY JONES,T  ANAEROBIC BOTTLE ONLY [facility] GRAM NEGATIVE RODS AEROBIC BOTTLE ONLY CRITICAL RESULT CALLED TO, READ BACK BY AND VERIFIED WITH: PHARMD J.MENTENHALL AT 1012 ON 03/20/2020 BY T.SAAD    Culture CLOSTRIDIUM RAMOSUM PSEUDOMONAS AERUGINOSA  (A)  Final   Report Status 03/22/2020 FINAL  Final   Organism ID, Bacteria PSEUDOMONAS AERUGINOSA  Final      Susceptibility   Pseudomonas aeruginosa - MIC*    CEFTAZIDIME 4 SENSITIVE Sensitive     CIPROFLOXACIN <=0.25 SENSITIVE Sensitive     GENTAMICIN <=1 SENSITIVE Sensitive     IMIPENEM 2 SENSITIVE Sensitive     PIP/TAZO 16 SENSITIVE Sensitive     CEFEPIME Value in next row Sensitive      SENSITIVEPerformed at [place] [address] [place], [place] [address]    * PSEUDOMONAS AERUGINOSA  Culture, blood (Routine x 2)     Status: Abnormal   Collection Time: 03/18/20  7:39 PM   Specimen: BLOOD RIGHT ARM  Result Value Ref Range Status   Specimen Description   Final    BLOOD RIGHT ARM Performed at [facility], [address] [place], [place] [address]    Special Requests   Final    BOTTLES DRAWN AEROBIC AND ANAEROBIC Blood Culture adequate volume Performed at [facility], [address], [place], [place] [address]    Culture  Setup Time   Final    Lonell Grandchild  NEGATIVE RODS AEROBIC BOTTLE Gram Stain Report Called to,Read Back By and Verified With: PARSONS,L AT 9924 ON 03/20/20 BY HUFFINES,S. Performed at [facility], [address] [place], [place] [address]    Culture (A)  Final    PSEUDOMONAS AERUGINOSA SUSCEPTIBILITIES PERFORMED ON PREVIOUS CULTURE WITHIN THE LAST 5 DAYS. Performed at [facility], [place] [address] [place], [place] [address]    Report Status 03/22/2020 FINAL  Final  Blood Culture ID Panel (Reflexed)     Status: Abnormal   Collection Time: 03/18/20  7:39 PM  Result Value Ref Range Status   Enterococcus faecalis NOT DETECTED NOT DETECTED Final   Enterococcus Faecium NOT DETECTED NOT  DETECTED Final   Listeria monocytogenes NOT DETECTED NOT DETECTED Final   Staphylococcus species NOT DETECTED NOT DETECTED Final   Staphylococcus aureus (BCID) NOT DETECTED NOT DETECTED Final   Staphylococcus epidermidis NOT DETECTED NOT DETECTED Final   Staphylococcus lugdunensis NOT DETECTED NOT DETECTED Final   Streptococcus species NOT DETECTED NOT DETECTED Final   Streptococcus agalactiae NOT DETECTED NOT DETECTED Final   Streptococcus pneumoniae NOT DETECTED NOT DETECTED Final   Streptococcus pyogenes NOT DETECTED NOT DETECTED Final   A.calcoaceticus-baumannii NOT DETECTED NOT DETECTED Final   Bacteroides fragilis NOT DETECTED NOT DETECTED Final   Enterobacterales NOT DETECTED NOT DETECTED Final   Enterobacter cloacae complex NOT DETECTED NOT DETECTED Final   Escherichia coli NOT DETECTED NOT DETECTED Final   Klebsiella aerogenes NOT DETECTED NOT DETECTED Final   Klebsiella oxytoca NOT DETECTED NOT DETECTED Final   Klebsiella pneumoniae NOT DETECTED NOT DETECTED Final   Proteus species NOT DETECTED NOT DETECTED Final   Salmonella species NOT DETECTED NOT DETECTED Final   Serratia marcescens NOT DETECTED NOT DETECTED Final   Haemophilus influenzae NOT DETECTED NOT DETECTED Final   Neisseria meningitidis NOT DETECTED NOT DETECTED Final   Pseudomonas aeruginosa DETECTED (A) NOT DETECTED Final    Comment: CRITICAL RESULT CALLED TO, READ BACK BY AND VERIFIED WITH: PHARM D J.MENTENHALL AT 1012 ON 03/20/2020 BY T.SAAD    Stenotrophomonas maltophilia NOT DETECTED NOT DETECTED Final   Candida albicans NOT DETECTED NOT DETECTED Final   Candida auris NOT DETECTED NOT DETECTED Final   Candida glabrata NOT DETECTED NOT DETECTED Final   Candida krusei NOT DETECTED NOT DETECTED Final   Candida parapsilosis NOT DETECTED NOT DETECTED Final   Candida tropicalis NOT DETECTED NOT DETECTED Final   Cryptococcus neoformans/gattii NOT DETECTED NOT DETECTED Final   CTX-M ESBL NOT DETECTED NOT  DETECTED Final   Carbapenem resistance IMP NOT DETECTED NOT DETECTED Final   Carbapenem resistance KPC NOT DETECTED NOT DETECTED Final   Carbapenem resistance NDM NOT DETECTED NOT DETECTED Final   Carbapenem resistance VIM NOT DETECTED NOT DETECTED Final    Comment: Performed at [facility], [place] [address], [place], [place] [address]  Urine culture     Status: Abnormal   Collection Time: 03/19/20  7:10 AM   Specimen: In/Out Cath Urine  Result Value Ref Range Status   Specimen Description   Final    IN/OUT CATH URINE Performed at [facility], [address] [place], [place] [address]    Special Requests   Final    NONE Performed at [facility], [address], [place], [place] [address]    Culture MULTIPLE SPECIES PRESENT, SUGGEST RECOLLECTION (A)  Final   Report Status 03/20/2020 FINAL  Final  Culture, blood (Routine X 2) w Reflex to ID Panel  Status: None (Preliminary result)   Collection Time: 03/23/20  2:48 PM   Specimen: BLOOD LEFT HAND  Result Value Ref Range Status   Specimen Description BLOOD LEFT HAND  Final   Special Requests   Final    BOTTLES DRAWN AEROBIC AND ANAEROBIC Blood Culture adequate volume   Culture   Final    NO GROWTH 3 DAYS Performed at [facility], [address] [place], [place] [address]    Report Status PENDING  Incomplete  Culture, blood (Routine X 2) w Reflex to ID Panel     Status: None (Preliminary result)   Collection Time: 03/23/20  2:48 PM   Specimen: Left Antecubital; Blood  Result Value Ref Range Status   Specimen Description LEFT ANTECUBITAL  Final   Special Requests   Final    BOTTLES DRAWN AEROBIC ONLY Blood Culture adequate volume   Culture   Final    NO GROWTH 3 DAYS Performed at [facility], [address], [place], [place] [address]    Report Status PENDING  Incomplete     Labs: BNP (last 3 results) No results for input(s): BNP in the last 8760 hours. Basic Metabolic Panel: Recent Labs  Lab 03/21/20 2217  03/22/20 0605 03/22/20 1414 03/22/20 1600 03/23/20 0731 03/24/20 0522  NA 152* 152* 149*  --  148* 143  K 3.3* 3.4* 3.0*  --  3.2* 3.5  CL 126* 124* 123*  --  119* 114*  CO2 17* 20* 20*  --  21* 20*  GLUCOSE 312* 208* 214*  --  153* 303*  BUN 45* 39* 36*  --  30* 28*  CREATININE 2.23* 2.11* 2.03*  --  1.78* 1.86*  CALCIUM 7.9* 8.0* 7.8*  --  8.1* 7.9*  MG  --   --   --  2.4  --   --    Liver Function Tests: No results for input(s): AST, ALT, ALKPHOS, BILITOT, PROT, ALBUMIN in the last 168 hours. No results for input(s): LIPASE, AMYLASE in the last 168 hours. No results for input(s): AMMONIA in the last 168 hours. CBC: Recent Labs  Lab 03/21/20 1233 03/21/20 2216 03/22/20 1102 03/23/20 0731 03/24/20 0522  WBC  --   --  7.4  --  8.0  HGB 8.7* 8.7* 9.2*  9.2* 9.6* 8.9*  HCT 29.5* 29.6* 31.3*  30.8* 32.0* 30.6*  MCV  --   --  93.7  --  93.9  PLT  --   --  157  --  156   Cardiac Enzymes: No results for input(s): CKTOTAL, CKMB, CKMBINDEX, TROPONINI in the last 168 hours. BNP: Invalid input(s): POCBNP CBG: Recent Labs  Lab 03/25/20 2011 03/25/20 2358 03/26/20 0421 03/26/20 0743 03/26/20 1137  GLUCAP 160* 206* 198* 162* 115*   D-Dimer No results for input(s): DDIMER in the last 72 hours. Hgb A1c No results for input(s): HGBA1C in the last 72 hours. Lipid Profile No results for input(s): CHOL, HDL, LDLCALC, TRIG, CHOLHDL, LDLDIRECT in the last 72 hours. Thyroid function studies No results for input(s): TSH, T4TOTAL, T3FREE, THYROIDAB in the last 72 hours.  Invalid input(s): FREET3 Anemia work up No results for input(s): VITAMINB12, FOLATE, FERRITIN, TIBC, IRON, RETICCTPCT in the last 72 hours. Urinalysis    Component Value Date/Time   COLORURINE YELLOW 03/19/2020 0710   APPEARANCEUR CLEAR 03/19/2020 0710   LABSPEC 1.016 03/19/2020 0710   PHURINE 6.0 03/19/2020 0710   GLUCOSEU >=500 (A) 03/19/2020 0710   HGBUR MODERATE (A) 03/19/2020 0710  BILIRUBINUR  NEGATIVE 03/19/2020 0710   KETONESUR NEGATIVE 03/19/2020 0710   PROTEINUR 30 (A) 03/19/2020 0710   UROBILINOGEN 0.2 08/20/2013 1820   NITRITE POSITIVE (A) 03/19/2020 0710   LEUKOCYTESUR MODERATE (A) 03/19/2020 0710   Sepsis Labs Invalid input(s): PROCALCITONIN,  WBC,  LACTICIDVEN Microbiology Recent Results (from the past 240 hour(s))  Respiratory Panel by RT PCR (Flu A&B, Covid) - Nasopharyngeal Swab     Status: None   Collection Time: 03/18/20  6:58 PM   Specimen: Nasopharyngeal Swab  Result Value Ref Range Status   SARS Coronavirus 2 by RT PCR NEGATIVE NEGATIVE Final    Comment: (NOTE) SARS-CoV-2 target nucleic acids are NOT DETECTED.  The SARS-CoV-2 RNA is generally detectable in upper respiratoy specimens during the acute phase of infection. The lowest concentration of SARS-CoV-2 viral copies this assay can detect is 131 copies/mL. A negative result does not preclude SARS-Cov-2 infection and should not be used as the sole basis for treatment or other patient management decisions. A negative result may occur with  improper specimen collection/handling, submission of specimen other than nasopharyngeal swab, presence of viral mutation(s) within the areas targeted by this assay, and inadequate number of viral copies (<131 copies/mL). A negative result must be combined with clinical observations, patient history, and epidemiological information. The expected result is Negative.  Fact Sheet for Patients:  PinkCheek.be  Fact Sheet for Healthcare Providers:  GravelBags.it  This test is no t yet approved or cleared by the [place] FDA and  has been authorized for detection and/or diagnosis of SARS-CoV-2 by FDA under an Emergency Use Authorization (EUA). This EUA will remain  in effect (meaning this test can be used) for the duration of the COVID-19 declaration under Section 564(b)(1) of the Act, 21 U.S.C. section  360bbb-3(b)(1), unless the authorization is terminated or revoked sooner.     Influenza A by PCR NEGATIVE NEGATIVE Final   Influenza B by PCR NEGATIVE NEGATIVE Final    Comment: (NOTE) The Xpert Xpress SARS-CoV-2/FLU/RSV assay is intended as an aid in  the diagnosis of influenza from Nasopharyngeal swab specimens and  should not be used as a sole basis for treatment. Nasal washings and  aspirates are unacceptable for Xpert Xpress SARS-CoV-2/FLU/RSV  testing.  Fact Sheet for Patients: PinkCheek.be  Fact Sheet for Healthcare Providers: GravelBags.it  This test is not yet approved or cleared by the [place] FDA and  has been authorized for detection and/or diagnosis of SARS-CoV-2 by  FDA under an Emergency Use Authorization (EUA). This EUA will remain  in effect (meaning this test can be used) for the duration of the  Covid-19 declaration under Section 564(b)(1) of the Act, 21  U.S.C. section 360bbb-3(b)(1), unless the authorization is  terminated or revoked. Performed at [facility], [address] [place], [place] [address]   Culture, blood (Routine x 2)     Status: Abnormal   Collection Time: 03/18/20  7:39 PM   Specimen: BLOOD RIGHT ARM  Result Value Ref Range Status   Specimen Description BLOOD RIGHT ARM  Final   Special Requests   Final    BOTTLES DRAWN AEROBIC AND ANAEROBIC Blood Culture adequate volume   Culture  Setup Time   Final    GRAM POSITIVE RODS Gram Stain Report Called to,Read Back By and Verified With: KATIE SITES @1257  03/19/20 BY JONES,T  ANAEROBIC BOTTLE ONLY [facility] GRAM NEGATIVE RODS AEROBIC BOTTLE ONLY CRITICAL RESULT CALLED TO, READ BACK BY AND VERIFIED WITH: PHARMD J.MENTENHALL AT 4782  ON 03/20/2020 BY T.SAAD    Culture CLOSTRIDIUM RAMOSUM PSEUDOMONAS AERUGINOSA  (A)  Final   Report Status 03/22/2020 FINAL  Final   Organism ID, Bacteria PSEUDOMONAS AERUGINOSA  Final      Susceptibility    Pseudomonas aeruginosa - MIC*    CEFTAZIDIME 4 SENSITIVE Sensitive     CIPROFLOXACIN <=0.25 SENSITIVE Sensitive     GENTAMICIN <=1 SENSITIVE Sensitive     IMIPENEM 2 SENSITIVE Sensitive     PIP/TAZO 16 SENSITIVE Sensitive     CEFEPIME Value in next row Sensitive      SENSITIVEPerformed at [place] [address] [place], [place] [address]    * PSEUDOMONAS AERUGINOSA  Culture, blood (Routine x 2)     Status: Abnormal   Collection Time: 03/18/20  7:39 PM   Specimen: BLOOD RIGHT ARM  Result Value Ref Range Status   Specimen Description   Final    BLOOD RIGHT ARM Performed at [facility], [address] [place], [place] [address]    Special Requests   Final    BOTTLES DRAWN AEROBIC AND ANAEROBIC Blood Culture adequate volume Performed at [facility], [address], [place], [place] [address]    Culture  Setup Time   Final    GRAM NEGATIVE RODS AEROBIC BOTTLE Gram Stain Report Called to,Read Back By and Verified With: PARSONS,L AT 2353 ON 03/20/20 BY HUFFINES,S. Performed at [facility], [address], [place],  [address]    Culture (A)  Final    PSEUDOMONAS AERUGINOSA SUSCEPTIBILITIES PERFORMED ON PREVIOUS CULTURE WITHIN THE LAST 5 DAYS. Performed at [facility], [place] [address] [place],  [address]    Report Status 03/22/2020 FINAL  Final  Blood Culture ID Panel (Reflexed)     Status: Abnormal   Collection Time: 03/18/20  7:39 PM  Result Value Ref Range Status   Enterococcus faecalis NOT DETECTED NOT DETECTED Final   Enterococcus Faecium NOT DETECTED NOT DETECTED Final   Listeria monocytogenes NOT DETECTED NOT DETECTED Final   Staphylococcus species NOT DETECTED NOT DETECTED Final   Staphylococcus aureus (BCID) NOT DETECTED NOT DETECTED Final   Staphylococcus epidermidis NOT DETECTED NOT DETECTED Final   Staphylococcus lugdunensis NOT DETECTED NOT DETECTED Final   Streptococcus species NOT DETECTED NOT DETECTED Final   Streptococcus  agalactiae NOT DETECTED NOT DETECTED Final   Streptococcus pneumoniae NOT DETECTED NOT DETECTED Final   Streptococcus pyogenes NOT DETECTED NOT DETECTED Final   A.calcoaceticus-baumannii NOT DETECTED NOT DETECTED Final   Bacteroides fragilis NOT DETECTED NOT DETECTED Final   Enterobacterales NOT DETECTED NOT DETECTED Final   Enterobacter cloacae complex NOT DETECTED NOT DETECTED Final   Escherichia coli NOT DETECTED NOT DETECTED Final   Klebsiella aerogenes NOT DETECTED NOT DETECTED Final   Klebsiella oxytoca NOT DETECTED NOT DETECTED Final   Klebsiella pneumoniae NOT DETECTED NOT DETECTED Final   Proteus species NOT DETECTED NOT DETECTED Final   Salmonella species NOT DETECTED NOT DETECTED Final   Serratia marcescens NOT DETECTED NOT DETECTED Final   Haemophilus influenzae NOT DETECTED NOT DETECTED Final   Neisseria meningitidis NOT DETECTED NOT DETECTED Final   Pseudomonas aeruginosa DETECTED (A) NOT DETECTED Final    Comment: CRITICAL RESULT CALLED TO, READ BACK BY AND VERIFIED WITH: PHARM D J.MENTENHALL AT 1012 ON 03/20/2020 BY T.SAAD    Stenotrophomonas maltophilia NOT DETECTED NOT DETECTED Final   Candida albicans NOT DETECTED NOT DETECTED Final   Candida auris NOT DETECTED NOT DETECTED Final  Candida glabrata NOT DETECTED NOT DETECTED Final   Candida krusei NOT DETECTED NOT DETECTED Final   Candida parapsilosis NOT DETECTED NOT DETECTED Final   Candida tropicalis NOT DETECTED NOT DETECTED Final   Cryptococcus neoformans/gattii NOT DETECTED NOT DETECTED Final   CTX-M ESBL NOT DETECTED NOT DETECTED Final   Carbapenem resistance IMP NOT DETECTED NOT DETECTED Final   Carbapenem resistance KPC NOT DETECTED NOT DETECTED Final   Carbapenem resistance NDM NOT DETECTED NOT DETECTED Final   Carbapenem resistance VIM NOT DETECTED NOT DETECTED Final    Comment: Performed at [facility] [address] [place], [place] [address]  Urine culture     Status: Abnormal   Collection  Time: 03/19/20  7:10 AM   Specimen: In/Out Cath Urine  Result Value Ref Range Status   Specimen Description   Final    IN/OUT CATH URINE Performed at [facility], [address], [place], [place] [address]    Special Requests   Final    NONE Performed at [facility], [address] [place], [place] [address]    Culture MULTIPLE SPECIES PRESENT, SUGGEST RECOLLECTION (A)  Final   Report Status 03/20/2020 FINAL  Final  Culture, blood (Routine X 2) w Reflex to ID Panel     Status: None (Preliminary result)   Collection Time: 03/23/20  2:48 PM   Specimen: BLOOD LEFT HAND  Result Value Ref Range Status   Specimen Description BLOOD LEFT HAND  Final   Special Requests   Final    BOTTLES DRAWN AEROBIC AND ANAEROBIC Blood Culture adequate volume   Culture   Final    NO GROWTH 3 DAYS Performed at [facility], [address], [place], [place] [address]    Report Status PENDING  Incomplete  Culture, blood (Routine X 2) w Reflex to ID Panel     Status: None (Preliminary result)   Collection Time: 03/23/20  2:48 PM   Specimen: Left Antecubital; Blood  Result Value Ref Range Status   Specimen Description LEFT ANTECUBITAL  Final   Special Requests   Final    BOTTLES DRAWN AEROBIC ONLY Blood Culture adequate volume   Culture   Final    NO GROWTH 3 DAYS Performed at [facility], [address] [place],  [address]    Report Status PENDING  Incomplete     Time coordinating discharge: 79mins  SIGNED:   Kathie Dike, MD  [facility] 03/27/2020, 3:16 PM   If 7PM-7AM, please contact night-coverage [email or web address]

## 2020-03-27 NOTE — TOC Transition Note (Signed)
Transition of Care [facility]) - CM/SW Discharge Note   Patient Details  Name: Paul Perez MRN: [id number] Date of Birth: Sep 13, 1927  Transition of Care [facility]) CM/SW Contact:  Boneta Lucks, RN Phone Number: 03/27/2020, 4:45 PM   Clinical Narrative:   Hospice has a bed. RN to call report and send DC summary with patient. EMS has been schedule.       Barriers to Discharge: Other (comment), Hospice Bed not available (GIP)   Patient Goals and CMS Choice Patient states their goals for this hospitalization and ongoing recovery are:: to go to resident hospice when bed is available.   Discharge Placement              Patient chooses bed at:  [facility]) Patient to be transferred to facility by: EMS Name of family member notified: [name] Patient and family notified of of transfer: 03/27/20  Discharge Plan and Services    [place] Date [place]: 03/25/20 Time HH Agency Contacted: 1000 Representative spoke with at [place]: [place]   Readmission Risk Interventions Readmission Risk Prevention Plan 03/19/2020  Transportation Screening Complete  Medication Review [employer]) Complete  HRI or Home Care Consult Complete  SW Recovery Care/Counseling Consult Complete  Palliative Care Screening Complete  Skilled Nursing Facility Complete  Some recent data might be hidden

## 2020-03-27 NOTE — Progress Notes (Signed)
Report called to [facility] to [place]. No further questions at this time. Pt left via EMS.

## 2020-03-28 LAB — CULTURE, BLOOD (ROUTINE X 2)
Culture: NO GROWTH
Culture: NO GROWTH
Special Requests: ADEQUATE
Special Requests: ADEQUATE

## 2020-05-16 DEATH — deceased
